# Patient Record
Sex: Female | Born: 1940 | Race: White | Hispanic: No | Smoking: Current every day smoker
Health system: Southern US, Community
[De-identification: ages and names within clinical notes are randomized; demographics above are authoritative.]

## PROBLEM LIST (undated history)

## (undated) DIAGNOSIS — J45909 Unspecified asthma, uncomplicated: Secondary | ICD-10-CM

## (undated) DIAGNOSIS — Z950 Presence of cardiac pacemaker: Secondary | ICD-10-CM

## (undated) DIAGNOSIS — C801 Malignant (primary) neoplasm, unspecified: Secondary | ICD-10-CM

## (undated) DIAGNOSIS — I10 Essential (primary) hypertension: Secondary | ICD-10-CM

## (undated) DIAGNOSIS — I509 Heart failure, unspecified: Secondary | ICD-10-CM

## (undated) DIAGNOSIS — M109 Gout, unspecified: Secondary | ICD-10-CM

## (undated) DIAGNOSIS — I447 Left bundle-branch block, unspecified: Secondary | ICD-10-CM

## (undated) DIAGNOSIS — I219 Acute myocardial infarction, unspecified: Secondary | ICD-10-CM

## (undated) DIAGNOSIS — K219 Gastro-esophageal reflux disease without esophagitis: Secondary | ICD-10-CM

## (undated) DIAGNOSIS — G458 Other transient cerebral ischemic attacks and related syndromes: Secondary | ICD-10-CM

## (undated) DIAGNOSIS — E785 Hyperlipidemia, unspecified: Secondary | ICD-10-CM

## (undated) DIAGNOSIS — Z951 Presence of aortocoronary bypass graft: Secondary | ICD-10-CM

## (undated) DIAGNOSIS — N183 Chronic kidney disease, stage 3 unspecified: Secondary | ICD-10-CM

## (undated) DIAGNOSIS — F419 Anxiety disorder, unspecified: Secondary | ICD-10-CM

## (undated) DIAGNOSIS — I2510 Atherosclerotic heart disease of native coronary artery without angina pectoris: Secondary | ICD-10-CM

## (undated) DIAGNOSIS — Z7901 Long term (current) use of anticoagulants: Secondary | ICD-10-CM

## (undated) DIAGNOSIS — N200 Calculus of kidney: Secondary | ICD-10-CM

## (undated) DIAGNOSIS — J449 Chronic obstructive pulmonary disease, unspecified: Secondary | ICD-10-CM

## (undated) DIAGNOSIS — I70219 Atherosclerosis of native arteries of extremities with intermittent claudication, unspecified extremity: Secondary | ICD-10-CM

## (undated) DIAGNOSIS — E119 Type 2 diabetes mellitus without complications: Secondary | ICD-10-CM

## (undated) DIAGNOSIS — Z95810 Presence of automatic (implantable) cardiac defibrillator: Secondary | ICD-10-CM

## (undated) DIAGNOSIS — F32A Depression, unspecified: Secondary | ICD-10-CM

## (undated) DIAGNOSIS — E039 Hypothyroidism, unspecified: Secondary | ICD-10-CM

## (undated) DIAGNOSIS — I6529 Occlusion and stenosis of unspecified carotid artery: Secondary | ICD-10-CM

## (undated) HISTORY — PX: CHOLECYSTECTOMY: SHX55

## (undated) HISTORY — PX: HIATAL HERNIA REPAIR: SHX195

## (undated) HISTORY — PX: CORONARY ANGIOPLASTY: SHX604

## (undated) HISTORY — DX: Essential (primary) hypertension: I10

## (undated) HISTORY — DX: Acute myocardial infarction, unspecified: I21.9

## (undated) HISTORY — PX: ABDOMINAL HYSTERECTOMY: SHX81

## (undated) HISTORY — PX: THYROID SURGERY: SHX805

## (undated) HISTORY — DX: Occlusion and stenosis of unspecified carotid artery: I65.29

## (undated) HISTORY — PX: CORONARY ARTERY BYPASS GRAFT: SHX141

---

## 1997-10-12 ENCOUNTER — Ambulatory Visit (HOSPITAL_COMMUNITY): Admission: RE | Admit: 1997-10-12 | Discharge: 1997-10-12 | Payer: Self-pay | Admitting: Family Medicine

## 1997-10-20 ENCOUNTER — Ambulatory Visit (HOSPITAL_COMMUNITY): Admission: RE | Admit: 1997-10-20 | Discharge: 1997-10-20 | Payer: Self-pay | Admitting: Family Medicine

## 1998-07-26 ENCOUNTER — Other Ambulatory Visit: Admission: RE | Admit: 1998-07-26 | Discharge: 1998-07-26 | Payer: Self-pay | Admitting: Family Medicine

## 2004-05-14 ENCOUNTER — Ambulatory Visit: Payer: Self-pay | Admitting: Family Medicine

## 2004-08-27 ENCOUNTER — Ambulatory Visit: Payer: Self-pay | Admitting: Family Medicine

## 2005-04-22 ENCOUNTER — Ambulatory Visit: Payer: Self-pay | Admitting: Neurology

## 2005-07-01 ENCOUNTER — Ambulatory Visit: Payer: Self-pay | Admitting: Family Medicine

## 2005-07-04 ENCOUNTER — Ambulatory Visit: Payer: Self-pay | Admitting: Family Medicine

## 2006-03-20 ENCOUNTER — Other Ambulatory Visit: Payer: Self-pay

## 2006-03-20 ENCOUNTER — Emergency Department: Payer: Self-pay | Admitting: General Practice

## 2006-08-04 ENCOUNTER — Ambulatory Visit: Payer: Self-pay | Admitting: Family Medicine

## 2007-08-27 ENCOUNTER — Inpatient Hospital Stay: Payer: Self-pay | Admitting: Internal Medicine

## 2007-08-27 ENCOUNTER — Other Ambulatory Visit: Payer: Self-pay

## 2007-09-08 ENCOUNTER — Ambulatory Visit: Payer: Self-pay | Admitting: Family Medicine

## 2008-09-26 ENCOUNTER — Ambulatory Visit: Payer: Self-pay | Admitting: Family Medicine

## 2009-08-19 HISTORY — PX: EYE SURGERY: SHX253

## 2009-09-27 ENCOUNTER — Ambulatory Visit: Payer: Self-pay | Admitting: Family Medicine

## 2009-12-18 ENCOUNTER — Ambulatory Visit: Payer: Self-pay | Admitting: Family

## 2009-12-26 ENCOUNTER — Ambulatory Visit: Payer: Self-pay | Admitting: Gastroenterology

## 2010-01-23 ENCOUNTER — Emergency Department: Payer: Self-pay | Admitting: Internal Medicine

## 2010-02-21 ENCOUNTER — Ambulatory Visit: Payer: Self-pay | Admitting: Podiatry

## 2010-10-10 ENCOUNTER — Ambulatory Visit: Payer: Self-pay | Admitting: Family Medicine

## 2010-11-18 ENCOUNTER — Emergency Department: Payer: Self-pay | Admitting: *Deleted

## 2011-03-18 DIAGNOSIS — E785 Hyperlipidemia, unspecified: Secondary | ICD-10-CM | POA: Insufficient documentation

## 2011-04-29 ENCOUNTER — Emergency Department: Payer: Self-pay | Admitting: Emergency Medicine

## 2011-04-29 LAB — CBC
HCT: 42.1 % (ref 35.0–47.0)
HGB: 14.3 g/dL (ref 12.0–16.0)
MCV: 91 fL (ref 80–100)
Platelet: 184 10*3/uL (ref 150–440)
RBC: 4.61 10*6/uL (ref 3.80–5.20)
RDW: 13 % (ref 11.5–14.5)
WBC: 6.8 10*3/uL (ref 3.6–11.0)

## 2011-04-29 LAB — TROPONIN I: Troponin-I: <0.02 ng/mL

## 2011-04-29 LAB — COMPREHENSIVE METABOLIC PANEL
Albumin: 3.9 g/dL (ref 3.4–5.0)
Alkaline Phosphatase: 87 U/L (ref 50–136)
Anion Gap: 9 (ref 7–16)
BUN: 16 mg/dL (ref 7–18)
Bilirubin,Total: 0.3 mg/dL (ref 0.2–1.0)
Calcium, Total: 9.6 mg/dL (ref 8.5–10.1)
Chloride: 103 mmol/L (ref 98–107)
Co2: 27 mmol/L (ref 21–32)
Creatinine: 1 mg/dL (ref 0.60–1.30)
EGFR (African American): >60
EGFR (Non-African Amer.): 58 — ABNORMAL LOW
Glucose: 176 mg/dL — ABNORMAL HIGH (ref 65–99)
Osmolality: 283 (ref 275–301)
Potassium: 3.8 mmol/L (ref 3.5–5.1)
SGOT(AST): 30 U/L (ref 15–37)
SGPT (ALT): 30 U/L
Sodium: 139 mmol/L (ref 136–145)
Total Protein: 7.6 g/dL (ref 6.4–8.2)

## 2011-04-29 LAB — CK TOTAL AND CKMB (NOT AT ARMC)
CK, Total: 56 U/L (ref 21–215)
CK-MB: 0.6 ng/mL (ref 0.5–3.6)

## 2011-07-03 ENCOUNTER — Emergency Department: Payer: Self-pay | Admitting: *Deleted

## 2011-07-03 LAB — CBC
HCT: 41.2 % (ref 35.0–47.0)
HGB: 13.9 g/dL (ref 12.0–16.0)
MCH: 31 pg (ref 26.0–34.0)
MCHC: 33.8 g/dL (ref 32.0–36.0)
RDW: 13.2 % (ref 11.5–14.5)

## 2011-07-04 ENCOUNTER — Emergency Department: Payer: Self-pay | Admitting: Emergency Medicine

## 2011-07-04 LAB — COMPREHENSIVE METABOLIC PANEL
Anion Gap: 10 (ref 7–16)
Bilirubin,Total: 0.2 mg/dL (ref 0.2–1.0)
Calcium, Total: 9.6 mg/dL (ref 8.5–10.1)
Co2: 27 mmol/L (ref 21–32)
EGFR (African American): >60
Glucose: 136 mg/dL — ABNORMAL HIGH (ref 65–99)
Osmolality: 289 (ref 275–301)
Potassium: 4.4 mmol/L (ref 3.5–5.1)
SGOT(AST): 24 U/L (ref 15–37)
SGPT (ALT): 31 U/L
Sodium: 144 mmol/L (ref 136–145)
Total Protein: 7.7 g/dL (ref 6.4–8.2)

## 2011-07-04 LAB — URINALYSIS, COMPLETE
Bacteria: NONE SEEN
Bilirubin,UR: NEGATIVE
Glucose,UR: NEGATIVE mg/dL (ref 0–75)
Nitrite: NEGATIVE
RBC,UR: 2 /HPF (ref 0–5)
Specific Gravity: 1.012 (ref 1.003–1.030)
Squamous Epithelial: 2
WBC UR: <1 /HPF (ref 0–5)

## 2011-07-04 LAB — CK TOTAL AND CKMB (NOT AT ARMC): CK-MB: 0.9 ng/mL (ref 0.5–3.6)

## 2011-07-04 LAB — TSH: Thyroid Stimulating Horm: 2.44 u[IU]/mL

## 2011-09-11 DIAGNOSIS — I10 Essential (primary) hypertension: Secondary | ICD-10-CM | POA: Insufficient documentation

## 2011-09-11 DIAGNOSIS — F341 Dysthymic disorder: Secondary | ICD-10-CM | POA: Insufficient documentation

## 2011-09-15 DIAGNOSIS — R0989 Other specified symptoms and signs involving the circulatory and respiratory systems: Secondary | ICD-10-CM | POA: Insufficient documentation

## 2011-10-14 ENCOUNTER — Ambulatory Visit: Payer: Self-pay | Admitting: Family Medicine

## 2012-01-02 LAB — COMPREHENSIVE METABOLIC PANEL
Albumin: 3.8 g/dL (ref 3.4–5.0)
Anion Gap: 10 (ref 7–16)
BUN: 11 mg/dL (ref 7–18)
Calcium, Total: 9.7 mg/dL (ref 8.5–10.1)
Co2: 28 mmol/L (ref 21–32)
EGFR (African American): >60
Glucose: 127 mg/dL — ABNORMAL HIGH (ref 65–99)
Osmolality: 288 (ref 275–301)
Potassium: 3.6 mmol/L (ref 3.5–5.1)
SGOT(AST): 31 U/L (ref 15–37)
SGPT (ALT): 37 U/L (ref 12–78)
Total Protein: 7.8 g/dL (ref 6.4–8.2)

## 2012-01-02 LAB — CBC
HGB: 15 g/dL (ref 12.0–16.0)
MCH: 31.1 pg (ref 26.0–34.0)
Platelet: 215 10*3/uL (ref 150–440)
RBC: 4.83 10*6/uL (ref 3.80–5.20)

## 2012-01-02 LAB — TROPONIN I: Troponin-I: <0.02 ng/mL

## 2012-01-03 LAB — CBC WITH DIFFERENTIAL/PLATELET
Basophil #: 0 10*3/uL (ref 0.0–0.1)
Basophil %: 0.3 %
Eosinophil #: 0.2 10*3/uL (ref 0.0–0.7)
Eosinophil %: 2.4 %
HGB: 12.5 g/dL (ref 12.0–16.0)
Lymphocyte %: 40.5 %
MCH: 30.1 pg (ref 26.0–34.0)
MCV: 91 fL (ref 80–100)
Neutrophil #: 3.8 10*3/uL (ref 1.4–6.5)
Neutrophil %: 51.8 %
RBC: 4.15 10*6/uL (ref 3.80–5.20)
RDW: 13.3 % (ref 11.5–14.5)

## 2012-01-03 LAB — BASIC METABOLIC PANEL
Anion Gap: 8 (ref 7–16)
BUN: 12 mg/dL (ref 7–18)
Chloride: 109 mmol/L — ABNORMAL HIGH (ref 98–107)
Co2: 28 mmol/L (ref 21–32)
Creatinine: 1.03 mg/dL (ref 0.60–1.30)
EGFR (African American): >60
Potassium: 3.7 mmol/L (ref 3.5–5.1)
Sodium: 145 mmol/L (ref 136–145)

## 2012-01-03 LAB — CK-MB
CK-MB: 0.9 ng/mL (ref 0.5–3.6)
CK-MB: 0.9 ng/mL (ref 0.5–3.6)

## 2012-01-04 ENCOUNTER — Inpatient Hospital Stay: Payer: Self-pay | Admitting: Student

## 2012-01-05 LAB — BASIC METABOLIC PANEL
Anion Gap: 8 (ref 7–16)
BUN: 15 mg/dL (ref 7–18)
Calcium, Total: 9 mg/dL (ref 8.5–10.1)
Chloride: 105 mmol/L (ref 98–107)
Co2: 27 mmol/L (ref 21–32)
Creatinine: 1.08 mg/dL (ref 0.60–1.30)
EGFR (Non-African Amer.): 52 — ABNORMAL LOW
Potassium: 3.7 mmol/L (ref 3.5–5.1)
Sodium: 140 mmol/L (ref 136–145)

## 2012-01-05 LAB — CBC
MCH: 31 pg (ref 26.0–34.0)
MCHC: 34.5 g/dL (ref 32.0–36.0)
Platelet: 156 10*3/uL (ref 150–440)
RBC: 4.32 10*6/uL (ref 3.80–5.20)
RDW: 12.8 % (ref 11.5–14.5)

## 2012-02-12 ENCOUNTER — Emergency Department: Payer: Self-pay | Admitting: *Deleted

## 2012-02-12 LAB — CBC
HGB: 15 g/dL (ref 12.0–16.0)
MCH: 31.2 pg (ref 26.0–34.0)
MCV: 90 fL (ref 80–100)
Platelet: 213 10*3/uL (ref 150–440)
RBC: 4.8 10*6/uL (ref 3.80–5.20)
RDW: 13.2 % (ref 11.5–14.5)
WBC: 8.2 10*3/uL (ref 3.6–11.0)

## 2012-02-12 LAB — COMPREHENSIVE METABOLIC PANEL
Alkaline Phosphatase: 101 U/L (ref 50–136)
BUN: 8 mg/dL (ref 7–18)
Bilirubin,Total: 0.3 mg/dL (ref 0.2–1.0)
Chloride: 109 mmol/L — ABNORMAL HIGH (ref 98–107)
Creatinine: 1 mg/dL (ref 0.60–1.30)
EGFR (African American): >60
EGFR (Non-African Amer.): 57 — ABNORMAL LOW
Glucose: 171 mg/dL — ABNORMAL HIGH (ref 65–99)
SGOT(AST): 32 U/L (ref 15–37)
SGPT (ALT): 32 U/L (ref 12–78)
Total Protein: 8 g/dL (ref 6.4–8.2)

## 2012-02-12 LAB — LIPASE, BLOOD: Lipase: 117 U/L (ref 73–393)

## 2012-02-12 LAB — URINALYSIS, COMPLETE
Bacteria: NONE SEEN
Bilirubin,UR: NEGATIVE
Glucose,UR: NEGATIVE mg/dL (ref 0–75)
RBC,UR: 4 /HPF (ref 0–5)
Squamous Epithelial: 1

## 2012-03-01 ENCOUNTER — Ambulatory Visit: Payer: Self-pay | Admitting: Gastroenterology

## 2012-03-05 ENCOUNTER — Ambulatory Visit: Payer: Self-pay | Admitting: Gastroenterology

## 2012-03-08 LAB — PATHOLOGY REPORT

## 2012-03-10 ENCOUNTER — Ambulatory Visit: Payer: Self-pay | Admitting: Surgery

## 2012-03-15 ENCOUNTER — Ambulatory Visit: Payer: Self-pay | Admitting: Surgery

## 2012-03-15 LAB — CK TOTAL AND CKMB (NOT AT ARMC)
CK, Total: 32 U/L (ref 21–215)
CK-MB: <0.5 ng/mL — ABNORMAL LOW (ref 0.5–3.6)

## 2012-03-15 LAB — TROPONIN I: Troponin-I: <0.02 ng/mL

## 2012-03-16 ENCOUNTER — Emergency Department: Payer: Self-pay | Admitting: Emergency Medicine

## 2012-03-16 LAB — COMPREHENSIVE METABOLIC PANEL
Alkaline Phosphatase: 97 U/L (ref 50–136)
Anion Gap: 7 (ref 7–16)
Bilirubin,Total: 0.4 mg/dL (ref 0.2–1.0)
Calcium, Total: 9.5 mg/dL (ref 8.5–10.1)
Chloride: 105 mmol/L (ref 98–107)
Co2: 27 mmol/L (ref 21–32)
EGFR (Non-African Amer.): 58 — ABNORMAL LOW
Glucose: 119 mg/dL — ABNORMAL HIGH (ref 65–99)
Osmolality: 279 (ref 275–301)
Potassium: 3.5 mmol/L (ref 3.5–5.1)
SGOT(AST): 27 U/L (ref 15–37)
SGPT (ALT): 35 U/L (ref 12–78)
Total Protein: 7.2 g/dL (ref 6.4–8.2)

## 2012-03-16 LAB — URINALYSIS, COMPLETE
Bacteria: NONE SEEN
Glucose,UR: NEGATIVE mg/dL (ref 0–75)
Leukocyte Esterase: NEGATIVE
Nitrite: NEGATIVE
Protein: NEGATIVE
Squamous Epithelial: 1
WBC UR: <1 /HPF (ref 0–5)

## 2012-03-16 LAB — CBC
Platelet: 168 10*3/uL (ref 150–440)
RBC: 4.26 10*6/uL (ref 3.80–5.20)
RDW: 12.9 % (ref 11.5–14.5)
WBC: 10.2 10*3/uL (ref 3.6–11.0)

## 2012-03-16 LAB — PATHOLOGY REPORT

## 2012-03-16 LAB — TROPONIN I: Troponin-I: <0.02 ng/mL

## 2012-06-12 ENCOUNTER — Emergency Department: Payer: Self-pay | Admitting: Emergency Medicine

## 2012-06-12 LAB — CBC
HGB: 15.3 g/dL (ref 12.0–16.0)
MCH: 30.8 pg (ref 26.0–34.0)
MCHC: 34.8 g/dL (ref 32.0–36.0)
MCV: 89 fL (ref 80–100)
RBC: 4.97 10*6/uL (ref 3.80–5.20)

## 2012-06-12 LAB — URINALYSIS, COMPLETE
Bacteria: NONE SEEN
Bilirubin,UR: NEGATIVE
Glucose,UR: NEGATIVE mg/dL (ref 0–75)
Ketone: NEGATIVE
Nitrite: NEGATIVE
Ph: 8 (ref 4.5–8.0)
Specific Gravity: 1.004 (ref 1.003–1.030)
Squamous Epithelial: <1

## 2012-06-12 LAB — COMPREHENSIVE METABOLIC PANEL
Albumin: 3.8 g/dL (ref 3.4–5.0)
Alkaline Phosphatase: 104 U/L (ref 50–136)
BUN: 11 mg/dL (ref 7–18)
Bilirubin,Total: 0.4 mg/dL (ref 0.2–1.0)
Calcium, Total: 9.3 mg/dL (ref 8.5–10.1)
Co2: 25 mmol/L (ref 21–32)
Creatinine: 0.94 mg/dL (ref 0.60–1.30)
EGFR (African American): >60
EGFR (Non-African Amer.): >60
Glucose: 105 mg/dL — ABNORMAL HIGH (ref 65–99)
Osmolality: 279 (ref 275–301)
Potassium: 4 mmol/L (ref 3.5–5.1)
SGPT (ALT): 25 U/L (ref 12–78)

## 2012-06-12 LAB — TROPONIN I: Troponin-I: <0.02 ng/mL

## 2012-06-12 LAB — CK TOTAL AND CKMB (NOT AT ARMC): CK-MB: <0.5 ng/mL — ABNORMAL LOW (ref 0.5–3.6)

## 2012-07-14 ENCOUNTER — Ambulatory Visit: Payer: Self-pay | Admitting: Gastroenterology

## 2012-07-16 LAB — PATHOLOGY REPORT

## 2012-10-14 ENCOUNTER — Ambulatory Visit: Payer: Self-pay | Admitting: Family Medicine

## 2012-10-23 DIAGNOSIS — E119 Type 2 diabetes mellitus without complications: Secondary | ICD-10-CM | POA: Insufficient documentation

## 2013-02-16 ENCOUNTER — Emergency Department: Payer: Self-pay | Admitting: Emergency Medicine

## 2013-03-30 ENCOUNTER — Inpatient Hospital Stay: Payer: Self-pay | Admitting: Student

## 2013-03-30 LAB — HEMOGLOBIN A1C: Hemoglobin A1C: 6 % (ref 4.2–6.3)

## 2013-03-30 LAB — LIPID PANEL
Cholesterol: 175 mg/dL (ref 0–200)
Ldl Cholesterol, Calc: 102 mg/dL — ABNORMAL HIGH (ref 0–100)
VLDL Cholesterol, Calc: 32 mg/dL (ref 5–40)

## 2013-03-30 LAB — CBC
HGB: 12.9 g/dL (ref 12.0–16.0)
MCH: 30.3 pg (ref 26.0–34.0)
MCV: 87 fL (ref 80–100)
Platelet: 187 10*3/uL (ref 150–440)
RDW: 13.7 % (ref 11.5–14.5)

## 2013-03-30 LAB — COMPREHENSIVE METABOLIC PANEL
Anion Gap: 6 — ABNORMAL LOW (ref 7–16)
BUN: 13 mg/dL (ref 7–18)
Calcium, Total: 9.3 mg/dL (ref 8.5–10.1)
Co2: 26 mmol/L (ref 21–32)
Creatinine: 0.92 mg/dL (ref 0.60–1.30)
EGFR (African American): >60
Osmolality: 278 (ref 275–301)
SGOT(AST): 13 U/L — ABNORMAL LOW (ref 15–37)

## 2013-03-30 LAB — TROPONIN I: Troponin-I: <0.02 ng/mL

## 2013-03-30 LAB — CK-MB: CK-MB: 0.9 ng/mL (ref 0.5–3.6)

## 2013-03-30 LAB — APTT: Activated PTT: 27.7 secs (ref 23.6–35.9)

## 2013-03-31 LAB — CK TOTAL AND CKMB (NOT AT ARMC): CK, Total: 149 U/L (ref 21–215)

## 2013-03-31 LAB — TSH: Thyroid Stimulating Horm: 0.266 u[IU]/mL — ABNORMAL LOW

## 2013-03-31 LAB — CBC WITH DIFFERENTIAL/PLATELET
Basophil #: 0 10*3/uL (ref 0.0–0.1)
Basophil %: 0.5 %
Eosinophil #: 0.1 10*3/uL (ref 0.0–0.7)
HCT: 35.9 % (ref 35.0–47.0)
Lymphocyte #: 2.1 10*3/uL (ref 1.0–3.6)
MCHC: 32.9 g/dL (ref 32.0–36.0)
MCV: 88 fL (ref 80–100)
Monocyte #: 0.5 x10 3/mm (ref 0.2–0.9)
Monocyte %: 6.5 %
Neutrophil %: 61.4 %
RBC: 4.09 10*6/uL (ref 3.80–5.20)
RDW: 13.6 % (ref 11.5–14.5)

## 2013-03-31 LAB — URINALYSIS, COMPLETE
Bacteria: NONE SEEN
Bilirubin,UR: NEGATIVE
Blood: NEGATIVE
Glucose,UR: NEGATIVE mg/dL (ref 0–75)
Ketone: NEGATIVE
Leukocyte Esterase: NEGATIVE
Nitrite: NEGATIVE
Protein: NEGATIVE
Squamous Epithelial: <1
WBC UR: NONE SEEN /HPF (ref 0–5)

## 2013-03-31 LAB — CK-MB: CK-MB: 35.7 ng/mL — ABNORMAL HIGH (ref 0.5–3.6)

## 2013-03-31 LAB — BASIC METABOLIC PANEL
Anion Gap: 6 — ABNORMAL LOW (ref 7–16)
BUN: 12 mg/dL (ref 7–18)
Calcium, Total: 9 mg/dL (ref 8.5–10.1)
Chloride: 108 mmol/L — ABNORMAL HIGH (ref 98–107)
Co2: 27 mmol/L (ref 21–32)
Creatinine: 0.86 mg/dL (ref 0.60–1.30)
Osmolality: 282 (ref 275–301)
Potassium: 3.8 mmol/L (ref 3.5–5.1)
Sodium: 141 mmol/L (ref 136–145)

## 2013-03-31 LAB — APTT: Activated PTT: 105.1 secs — ABNORMAL HIGH (ref 23.6–35.9)

## 2013-03-31 LAB — TROPONIN I: Troponin-I: 7.8 ng/mL — ABNORMAL HIGH

## 2013-04-01 LAB — BASIC METABOLIC PANEL
Calcium, Total: 9 mg/dL (ref 8.5–10.1)
Creatinine: 0.73 mg/dL (ref 0.60–1.30)
EGFR (African American): >60
Glucose: 93 mg/dL (ref 65–99)
Osmolality: 281 (ref 275–301)
Potassium: 3.6 mmol/L (ref 3.5–5.1)
Sodium: 142 mmol/L (ref 136–145)

## 2013-11-29 ENCOUNTER — Ambulatory Visit: Payer: Self-pay | Admitting: Family Medicine

## 2014-08-08 NOTE — Consult Note (Signed)
General Aspect 74 year old female with history of coronary artery disease followed by Dr. Clayborn Bigness as well as [facility] who was admitted with chest pain in the left bundle branch block.  Her left bundle branch block does not appear to be acute.  She is currently hemodynamically stable.  She describes a chest tightness and heaviness with associated mild nausea.  This has features similar to her angina.  She is ruled out for myocardial infarction thus far. she is hemodynamically stable and states she is compliant with her medications as an outpatient.   Physical Exam:   GEN well developed, well nourished, no acute distress    HEENT PERRL, hearing intact to voice    NECK supple    RESP normal resp effort  clear BS    CARD Regular rate and rhythm  No murmur    ABD denies tenderness  normal BS    LYMPH negative neck    EXTR negative cyanosis/clubbing, negative edema    SKIN normal to palpation    NEURO cranial nerves intact, motor/sensory function intact    PSYCH A+O to time, place, person   Review of Systems:   Subjective/Chief Complaint chest discomfort    General: No Complaints    Skin: No Complaints    ENT: No Complaints    Eyes: No Complaints    Neck: No Complaints    Respiratory: No Complaints    Cardiovascular: Chest pain or discomfort  Tightness    Gastrointestinal: No Complaints    Genitourinary: No Complaints    Vascular: No Complaints    Musculoskeletal: No Complaints    Neurologic: No Complaints    Hematologic: No Complaints    Endocrine: No Complaints    Psychiatric: No Complaints    Review of Systems: All other systems were reviewed and found to be negative    Medications/Allergies Reviewed Medications/Allergies reviewed     Hypothyroidism:    MI - Myocardial Infarct: Nov 2007   Hiatal Hernia:    Cataract Extraction: left and right   cad:    Hysterectomy - Partial:    Stent - Cardiac:    Cystectomy: cyst removed on left  side of neck   Hysterectomy - Partial:        Admit Diagnosis:   UNSTABLE ANGINA: 03-Jan-2012, Active, UNSTABLE ANGINA  Home Medications: Medication Instructions Status  levothyroxine 100 mcg (0.1 mg) oral tablet 1 tab(s) orally once a day Active  losartan 50 mg oral tablet 1 tab(s) orally once a day Active  omeprazole 20 mg oral delayed release capsule 1 cap(s) orally 2 times a day Active  alprazolam 0.25 mg oral tablet 1 tab(s) orally once a day as needed for anxiety. Active  Aspirin Enteric Coated 81 mg oral delayed release tablet 1 tab(s) orally once a day (at bedtime) Active  atenolol 25 mg oral tablet 0.5 tab(s) orally once a day (at bedtime) Active  Nitroquick 1 tab (0.4 milligrams) sublingual every 5 minutes up to 3 doses as needed for chest pain. *if no relief call md or go to emergency room* Active  Kombiglyze XR 5 mg-500 mg oral tablet, extended release 1 tab(s) orally once a day (at bedtime) Active  sucralfate 1 g oral tablet 1 tab(s) orally 4 times a day (before meals and at bedtime) as needed to coat stomach. Active  fluticasone 50 mcg/inh nasal spray 2 spray(s) into each nostril once a day (in the morning). Active  Fish Oil 1000 mg oral capsule 1 cap(s) orally 2  times a day Active  cinnamon-plus chromium 2000mg  tablet 1 tab(s) orally once a day (in the morning) Active   EKG:   EKG NSR    Abnormal LBBB    Sulfa drugs: Unknown    Impression 74 year old female with history of coronary artery disease status post PCI with history of recent chest pain prompting admission.  She described discomfort similar to her angina.  Her electrocardiogram reveals upper branch block.  She is ruled out for myocardial infarction.  Etiology of symptoms is unclear.  Progression of her coronary disease versus noncardiac etiology is likely however given her left bundle and chest discomfort similar to angina, we'll need to rule out progression of her coronary artery disease.  Given her left  bundle and rest symptoms, would recommend proceeding a left cardiac catheterization evaluate coronary anatomy.  Will discuss this with Dr. Clayborn Bigness and plan to proceed with this on Monday.    Plan 1.  Continue with current medications 2.  Follow on telemetry 3.  Consideration for left cardiac catheterization on Monday to evaluate coronary anatomy.   Electronic Signatures: Teodoro Spray (MD)  (Signed 14-Sep-13 09:53)  Authored: General Aspect/Present Illness, History and Physical Exam, Review of System, Past Medical History, Health Issues, Home Medications, EKG , Allergies, Impression/Plan   Last Updated: 14-Sep-13 09:53 by Teodoro Spray (MD)

## 2014-08-08 NOTE — Op Note (Signed)
PATIENT NAME:  Christine Obrien, Christine Obrien MR#:  [id number] DATE OF BIRTH:  Jul 27, 1940  DATE OF PROCEDURE:  03/15/2012  PREOPERATIVE DIAGNOSIS: Chronic cholecystitis.   POSTOPERATIVE DIAGNOSIS: Chronic cholecystitis.   PROCEDURE: Laparoscopic cholecystectomy, cholangiogram.   SURGEON: Rochel Brome, MD   ANESTHESIA: General.   INDICATIONS: This 74 year old has had recent frequent episodes of nausea, also right upper quadrant pain, recent ultrasound findings of evidence of a polyp and some sludge within the gallbladder, some slight thickening of gallbladder wall, and right upper quadrant tenderness. She also had Gastroenterology consultation and endoscopy. There was a rare Helicobacter. There was no ulcer found, and cholecystitis appeared to be a cause of her nausea and pain, and surgery was recommended for definitive treatment.   DESCRIPTION OF PROCEDURE: The patient was placed on the operating table in the supine position under general endotracheal anesthesia. The abdomen was prepared with ChloraPrep and draped in a sterile manner. A short incision was made in the inferior aspect of the umbilicus and carried down to the deep fascia which was grasped with a laryngeal hook and elevated. A Veress needle was inserted, aspirated, and irrigated with a saline solution. Next, the peritoneal cavity was inflated with carbon dioxide. The Veress needle was removed. The 10 mm cannula was inserted. The 10 mm, 0-degree laparoscope was inserted to view the peritoneal cavity. There was some mild gaseous distention of the colon. The liver appeared normal. Another incision was made in the epigastrium slightly to the right of the midline to introduce an 11 mm cannula. Another incision was made in the right upper quadrant to insert two 5-mm cannulas. The patient was placed in the reverse Trendelenburg position and turned several degrees to the left. The gallbladder was retracted towards the right shoulder, appeared to have some  thickening of the gallbladder wall. Multiple adhesions were taken down with blunt and sharp dissection. The neck of the gallbladder was retracted inferiorly and laterally. The porta hepatis was demonstrated. The cystic duct was dissected free from surrounding structures. The cystic artery was dissected free from surrounding structures. The neck of the gallbladder was mobilized with incision of the visceral peritoneum. A critical view of safety was demonstrated. An Endo Clip was placed across the cystic duct adjacent to the neck of the gallbladder. An incision was made in the cystic duct to introduce a Reddick catheter. Half-strength Conray-60 dye was injected as the cholangiogram was done with fluoroscopy, viewing the biliary tree and flow of dye into the duodenum. No retained stones were seen. The Reddick catheter was removed. The cystic duct was doubly ligated with Endo Clips and divided. The cystic artery was controlled with double Endo Clips and divided. The gallbladder was dissected free from the liver with hook and cautery. There was very minimal degree of bleeding. Hemostasis was subsequently intact. The gallbladder was delivered up through the infraumbilical incision, opened, suctioned, and removed and submitted for routine pathology. The right upper quadrant was further inspected. Hemostasis was intact. The cannulas were removed. Carbon dioxide was allowed to escape from the peritoneal cavity. The fascial defect at the umbilicus was closed with a 0 Vicryl figure-of-eight suture. The four incisions were closed with interrupted 5-0 chromic subcuticular sutures, benzoin, and Steri-Strips. Dressings were applied with paper tape. The patient tolerated surgery satisfactorily and was then prepared for transfer to the recovery room.   ____________________________ Lenna Sciara. Rochel Brome, MD jws:cbb D: 03/15/2012 12:18:53 ET T: 03/15/2012 12:49:39 ET JOB#: [id number]  cc: Loreli Dollar, MD, <Dictator> Loreli Dollar  MD ELECTRONICALLY SIGNED 03/17/2012 9:20

## 2014-08-08 NOTE — H&P (Signed)
PATIENT NAME:  Christine Obrien, AVALLONE MR#:  [id number] DATE OF BIRTH:  07-06-1940  DATE OF ADMISSION:  01/02/2012  PRIMARY CARE PHYSICIAN: [name], MD  History obtained from patient, her husband at bedside. Old records have been reviewed. Case discussed with Dr. Thomasene Lot of ER. Chest x-ray and EKG personally reviewed.   CHIEF COMPLAINT: Chest pressure.   HISTORY OF PRESENT ILLNESS: A 74 year old Caucasian female patient with history of hypertension, diabetes mellitus, coronary artery disease status post stent in 2007 presents to the emergency room complaining of worsening chest pressure radiating to the neck along with nausea and shortness of breath. Patient mentions that in 2007 when she had the cardiac catheterization and stent, her pain was mainly in the neck radiating to the chest with nausea which was similar. This chest pressure is slowly worsening and the last time it lasted 1 hour before it resolved with nitroglycerin. This is not exertional with no aggravating or relieving factors. Associated with nausea and shortness of breath. The patient was previously admitted to [facility] in 2010 when she had a cardiac catheterization which showed patent stens and was discharged home.   PAST MEDICAL HISTORY: Coronary artery disease status post stent in 2007, status post catheterization with no intervention in 2010, depression, hypertension, hypothyroidism, hyperlipidemia, non-insulin-dependent diabetes mellitus, osteoporosis, carotid stenosis.   PAST SURGICAL HISTORY: Hysterectomy.   ALLERGIES: Sulfa.   HOME MEDICATIONS:  1. Xanax 0.25 mg once a day as needed.  2. Aspirin 81 mg oral daily.  3. Levothyroxine 100 mcg daily.  4. Lipitor 20 mg oral daily.  5. Metoprolol extended-release 20 mg daily.  6. Plavix 75 mg daily.   SOCIAL HISTORY: The patient smokes 1/2 pack of cigarettes a day. No alcohol. No illicit drugs. Lives at home with her husband.   FAMILY HISTORY: Consistent with  coronary artery disease.   REVIEW OF SYSTEMS: CONSTITUTIONAL: No fever, fatigue, weakness. EYES: No blurred vision, pain, redness. ENT: No tinnitus, ear pain, hearing loss. RESPIRATORY: No cough, wheeze, hemoptysis. Does complain of shortness of breath with the chest pain. CARDIOVASCULAR: Has chest pain. No orthopnea, edema. GASTROINTESTINAL: No nausea, vomiting, diarrhea, abdominal pain. GENITOURINARY: No dysuria, hematuria, frequency. ENDOCRINE: No polyuria, nocturia. Does have hypothyroidism. HEMATOLOGIC/LYMPHATIC: No anemia, easy bruising, or bleeding. INTEGUMENTARY: No acne, rash, lesions. MUSCULOSKELETAL: No back pain. No arthritis. NEUROLOGIC: No numbness, weakness, dysarthria. PSYCHIATRIC: No anxiety, depression.   PHYSICAL EXAMINATION: Temperature of 98.7, pulse is 71, blood pressure 134/79, respirations 22, saturating 98% on room air.   GENERAL: Elderly Caucasian female patient sitting up in bed, comfortable, conversational, cooperative with exam.  PSYCHIATRIC: Alert and oriented x3.  Mood and affect appropriate. Judgment intact.   HEENT: Atraumatic, normocephalic. Oral mucosa moist and pink. External ears and nose normal. No pallor. No icterus. Pupils bilaterally equal and reactive to light.   NECK: Supple. No thyromegaly. No palpable lymph nodes. Trachea midline. No carotid bruit, JVD.  CARDIOVASCULAR: S1, S2, regular rate and rhythm without any murmurs. Peripheral pulses 2+.   RESPIRATORY: Normal work of breathing. Clear to auscultation on both sides. Normal to percussion.     GI: Soft abdomen, nontender. Bowel sounds present. No hepatosplenomegaly palpable.   SKIN: Warm and dry. No petechiae, rash, ulcers.   MUSCULOSKELETAL: No joint swelling, redness, effusion of the large joints. Normal muscle tone.   NEUROLOGIC: Motor strength 5/5 in upper and lower extremities. Sensation to fine touch intact all over.   LABORATORY STUDIES: Show glucose 127, BUN 11, creatinine 1.07, GFR  greater than 60.  AST, ALT, alkaline phosphatase, bilirubin normal. Troponin less than 0.02. WBC 7.9, hemoglobin 15, platelets of 215,000. INR 0.9.   EKG shows left bundle branch block which is unchanged from prior EKG.   Chest x-ray shows no acute abnormalities.   ASSESSMENT AND PLAN:  1. Unstable angina in a patient with hypertension, diabetes, prior coronary artery disease status post stent, worsening chest pressure radiating to the neck with typical symptoms of shortness of breath and nausea. The patient continues to smoke, raising her risk for coronary artery disease. We will admit the patient, continue the beta-blocker, aspirin, Plavix patient is on and the statin.  We will get 2 more sets of cardiac enzymes. Consult Cardiology, Dr. Clayborn Bigness, who has seen the patient in the past. The patient might need a cardiac catheterization or stress test during the hospital stay.  2. Hypertension, well controlled. Continue medication. 3. Tobacco abuse. Patient has been counseled for more than 4 minutes to quit smoking. A nicotine patch has been ordered. The patient seems to be determined to quit smoking.  4. Diabetes mellitus. Sliding scale insulin and diabetic diet.  5. Deep venous thrombosis prophylaxis with Lovenox.    CODE STATUS: FULL CODE.   TIME SPENT: Time spent today on this case was 55 minutes with more than 50% time spent in coordination of care. ____________________________ Leia Alf. Taneah Masri, MD srs:vtd D: 01/02/2012 21:21:13 ET T: 01/03/2012 06:48:14 ET  JOB#: [id number] cc: Alveta Heimlich R. Crandall Harvel, MD, <Dictator> Youlanda Roys. Lovie Macadamia, MD Neita Carp MD ELECTRONICALLY SIGNED 01/03/2012 13:43

## 2014-08-08 NOTE — Discharge Summary (Signed)
PATIENT NAME:  Christine Obrien, RAIDER MR#:  [id number] DATE OF BIRTH:  1940-11-30  DATE OF ADMISSION:  01/04/2012 DATE OF DISCHARGE:  01/05/2012  CONSULTANTS:  Dr. Ubaldo Glassing and Dr. Clayborn Bigness, cardiology.   PRIMARY CARE PHYSICIAN: Dr. Lovie Macadamia   CHIEF COMPLAINT: Chest pressure.   DISCHARGE DIAGNOSES:  1. Chest pain, likely not cardiac, status post catheter and improvement with simethicone.  2. Possible bloating.  3. Bradycardia possibly in the setting of beta blocker use.  4. Tobacco abuse.  5. History of coronary artery disease status post stenting in the past.  6. Depression.  7. Hypertension.  8. Hypothyroidism.  9. Hyperlipidemia.  10. Non-insulin-dependent diabetes mellitus.  11. Osteoporosis.  12. Carotid stenosis.   DISCHARGE MEDICATIONS:  1. Levothyroxine 100 mcg daily.  2. Losartan 50 mg daily.  3. Omeprazole 20 mg 2 times Christine day.  4. Alprazolam 0.25 mg, 1 tab once Christine day as needed for anxiety.  5. Aspirin 81 mg daily.  6. NitroQuick 0.4 mg sublingually every five minutes times three as needed for chest pain. 7. Kombiglyze XR 5/500 mg extended-release, 1 tab once Christine day at bedtime.  8. Sucralfate 1 gram oral tablet 4 times Christine day as needed for bloating. 9. Fluticasone 50 mcg, twos sprays in each nostril once Christine day.  10. Fish oil 1000 mg 2 times Christine day. 11. Cinnamon plus acromion 2000 mg, 1 tab once Christine day. 12. Rosuvastatin 5 mg once Christine day.   DIET: Low sodium, low fat, low cholesterol diabetic diet.   ACTIVITY: As tolerated.   FOLLOWUP:  1. Please follow up with Dr. Clayborn Bigness, your cardiologist, in 1 to 2 weeks.  2. Please follow with your primary care physician in 1 to 2 weeks.   DISPOSITION: Home.  HISTORY OF PRESENT ILLNESS:  For full details of history and physical, please see the dictation on 09/13 by [name]. Briefly, this is Christine Obrien with coronary artery disease status post stenting in the past who presented with chest pressure radiating to the neck along with  nausea and shortness of breath. She was admitted to the hospitalist service for further evaluation and management.   SIGNIFICANT LABS AND IMAGING: Troponin is negative times three. CK-MB negative times three. CBC within normal limits on arrival. INR on arrival 0.9. Initial glucose 127, creatinine 1.07, potassium 3.6. LFTs within normal limits. X-ray of the chest, portable, one view: No acute cardiopulmonary disease.   The patient underwent Christine cardiac catheterization on 01/05/2012, the results of which are not up yet, but per Dr. Clayborn Bigness no intervention was needed and medical management was recommended.   HOSPITAL COURSE: The patient was admitted to the hospitalist service for observation and further work-up. Cardiology consult was also placed. Cycled cardiac markers were obtained, which were all negative. While hospitalized the patient also complained of chest pain/pressure,  however, described it as more with eating and some bloating as well and therefore was started on simethicone. She was seen by Dr. Ubaldo Glassing and given her history of coronary artery disease and stenting in the past was scheduled for Christine cardiac catheterization, which was done on 09/16. While hospitalized the patient did have bouts of bradycardia, which were asymptomatic.  Her atenolol has been held. Upon further conversation with the patient it appears that even the low dose 12.5-mg atenolol caused significant bradycardia. At this point she will be discharged without atenolol and if blood pressure is uncontrolled perhaps amlodipine or another medication can be started as an outpatient for the  blood pressure.  Gas-X did relieve her bloating and symptoms and after its initiation the patient had no further symptoms. At this point she will be followed up with an outpatient visit to her cardiologist. She should follow up with her primary care physician for blood pressure check-up as well.   TOTAL TIME SPENT: 35 minutes.      ____________________________ Vivien Presto, MD sa:bjt D: 01/05/2012 15:04:25 ET T: 01/06/2012 10:56:46 ET JOB#: [id number]  cc: Vivien Presto, MD, <Dictator> Youlanda Roys. Lovie Macadamia, MD Vivien Presto MD ELECTRONICALLY SIGNED 01/09/2012 14:22

## 2014-08-11 NOTE — Consult Note (Signed)
PATIENT NAME:  Christine Obrien, Christine Obrien MR#:  [id number] DATE OF BIRTH:  01/24/1941  DATE OF CONSULTATION:  03/30/2013  REFERRING PHYSICIAN:  Dr. Robet Leu CONSULTING PHYSICIAN:  Corey Skains, MD  REASON FOR CONSULTATION: Acute unstable angina and/or concerns for myocardial infarction.   CHIEF COMPLAINT:  "I had chest pain."  HISTORY OF PRESENT ILLNESS: This is a 74 year old female with known hypertension, hyperlipidemia, coronary artery disease status post previous myocardial infarction and stenting who has had acute onset of substernal chest discomfort radiating into her arms with diaphoresis and lasting approximately 2 hours. She had an EKG showing left bundle branch block with abnormal T-wave changes in the inferior leads consistent with myocardial ischemia, now with chest pain relief. The patient has had improvements of those EKG changes. The patient has not had any other further symptoms at this time.   REVIEW OF SYSTEMS: The remainder of review of systems negative for vision change, ringing in the ears, hearing loss, cough, congestion, heartburn, nausea, vomiting, diarrhea, bloody stools, stomach pain, extremity pain, leg weakness, cramping of the buttocks, known blood clots, headaches have been positive for the last several weeks, blackouts, dizzy spells, nosebleeds, congestion, trouble swallowing, frequent urination, urination at night.   PAST MEDICAL HISTORY: 1.  Coronary artery disease.  2.  Hypertension.  3.  Hyperlipidemia.  4.  Old myocardial infarction.   FAMILY HISTORY: Positive for family members with cardiovascular disease and hypertension.   SOCIAL HISTORY: Smokes 1 pack per day for 50 years. Occasionally drinks alcohol.   ALLERGIES:  As listed.   MEDICATIONS:  As listed.   PHYSICAL EXAMINATION: VITAL SIGNS: Blood pressure is 100/64 bilaterally, heart rate 78 while reclining and regular.  GENERAL: She is a well-appearing female in no acute distress.  HEAD, EYES, EARS, NOSE  AND THROAT:  No icterus, no  CARDIOVASCULAR:  Regular rate and rhythm. Normal S1, S2 with a 2/6 to 3/6 left upper sternal border murmur radiating throughout. PMI is diffuse. Carotid upstroke normal without bruit. Jugular venous appears normal.  LUNGS: Have few basilar crackles with normal respirations.  ABDOMEN: Soft, nontender, without hepatosplenomegaly or masses. Abdominal aorta is normal size without bruit.  EXTREMITIES:  Show 2+  , radial femoral arteries without lower extremity edema, cyanosis or clubbing   NEUROLOGIC: She is oriented to time, place and person, with normal mood and affect.   ASSESSMENT: A 74 year old female with coronary artery disease, hypertension, hyperlipidemia with previous myocardial infarction having acute onset of chest pain, shortness of breath consistent with unstable angina or non-ST elevation myocardial infarction, although troponins have been RECOMMENDATIONS: 1.  admit to telemetry. 2. serial ECG and e to assess for myocardial infarction with use of aspirin, nitrates for chest pain, myocardial infarction.  3.  Continue beta blocker for hypertension control. 4.  Probable proceed to cardiac catheterization for further risk r in assessmeatment of coronary artery disease and myocardial infarction. Patient understanatheterization. This includes the possibility of  that stroke heart attack ineeding or blood clot. The patient understands this and is at low risk for conscious sedation  ____________________________ Corey Skains, MD bjk:ce D: 03/30/2013 16:54:36 ET T: 03/30/2013 17:14:51 ET JOB#: [id number]  cc: Corey Skains, MD, <Dictator> Corey Skains MD ELECTRONICALLY SIGNED 03/31/2013 16:56

## 2014-08-12 NOTE — H&P (Signed)
PATIENT NAME:  Christine Obrien, Christine Obrien MR#:  [id number] DATE OF BIRTH:  01-Dec-1940  DATE OF ADMISSION:  03/30/2013  ADMITTING PHYSICIAN:  Gladstone Lighter, MD  PRIMARY CARE PHYSICIAN: Dr. Lovie Macadamia  CHIEF COMPLAINT:  Headache and bilateral arm pain.   HISTORY OF PRESENT ILLNESS:  Ms. Maniaci is a 74 year old very pleasant Caucasian female with past medical history significant for hypertension and diabetes, both are diet-controlled, history of temporal headaches that usually start in the periorbital region and temporal region radiating to the occipital region. She is being followed by a neurologist for the same and her frequency of the headaches have improved recently. She was at [facility]. She was shopping with her family and  suddenly she had an episode of facial pain instead of the periorbital pain like she usually gets. The pain and was referred to the back and then was going down between her shoulder blades and then she started to have bilateral arm burning sensation going down into her abdomen. Denies any chest pain, but this episode was associated with dyspnea, diaphoresis,  nausea, feeling cold and clammy which is completely not unusual for her. The pain lasted for almost 2 hours, so she presented to the ER. Her first set of troponins were negative. She had an EKG which shows left bundle branch block but concerning ST elevations in the inferior leads. Once her pain was better, repeat EKG showed ST depressions in the same leads. So she is being admitted for unstable angina with EKG changes, possible NSTEMI and will need a cardiac catheterization. She was already seen by cardiologist, Dr. Nehemiah Massed, in the ER.   PAST MEDICAL HISTORY: 1.  Coronary artery disease status post stents.  2.  Depression.  3.  Diet-controlled hypertension.  4.  Diet-controlled diabetes mellitus.  5.  Hyperlipidemia. 6.  Hypothyroidism.  7.  Osteoporosis.   PAST SURGICAL HISTORY: 1.  Hysterectomy. 2.  Cholecystectomy.  3.  Cardiac  stent placement.   ALLERGIES TO MEDICATIONS:  SULFA.  HOME MEDICATIONS:  1.  Xanax 0.5 mg p.o. b.i.d. p.r.n. for anxiety.  2.  Aspirin 81 mg p.o. daily.  3.  Celexa 10 mg p.o. daily.  4.  Crestor 5 mg p.o. on Monday, Wednesday, Friday.  5.  Synthroid 100 mcg p.o. daily.  6.  Nitroglycerin sublingual 0.4 mg p.r.n. for chest pain.  7.  Prilosec 20 mg p.o. daily.  8.  Tramadol 50 mg q. 6 hours p.r.n. for pain.   SOCIAL HISTORY:  Lives at home with her husband, continues to smoke about 1 pack per day. No alcohol use.   FAMILY HISTORY: Significant for cardiac disease.  REVIEW OF SYSTEMS:  CONSTITUTIONAL: No fever, fatigue or weakness.  EYES: Positive for blurry vision and also had cataract surgery. No inflammation or glaucoma.  ENT: No tinnitus, ear pain, hearing loss, epistaxis or discharge.  RESPIRATORY: No cough, wheeze, hemoptysis or COPD.  CARDIOVASCULAR: No chest pain, orthopnea, edema,  arrhythmia, palpitations or syncope.  GASTROINTESTINAL: No nausea, vomiting, diarrhea, abdominal pain, hematemesis or melena.  GENITOURINARY: No dysuria, hematuria, renal calculus, frequency or incontinence.  ENDOCRINE: No polyuria, nocturia, or thyroid problems, heat or cold intolerance.  HEMATOLOGY: No anemia, easy bruising or bleeding.  SKIN: No acne, rash or lesion. MUSCULOSKELETAL: no gout or arthritis NEUROLOGIC: No numbness, weakness, CVA, TIA or seizures. Positive for temporal headaches.  PSYCHOLOGICAL: No anxiety, insomnia, depression.   PHYSICAL EXAMINATION: VITAL SIGNS: Temperature afebrile, pulse 66, respirations 18, blood pressure 127/56, pulse oximetry 95% on room air.  GENERAL: Well-built, well-nourished female lying in bed, not in any acute distress.  HEENT: Normocephalic, atraumatic. Pupils equal, round, reacting to light. Anicteric sclerae. Extraocular movements intact. Oropharynx clear without erythema, mass or exudates.  NECK: Supple. No thyromegaly, JVD, carotid bruits. No  lymphadenopathy.  LUNGS: Moving air bilaterally. No wheeze or crackles. No use of accessory muscles for breathing.  CARDIOVASCULAR: S1, S2 regular rate and rhythm, has a 3/6 systolic murmur. No rubs or gallops.  ABDOMEN: Soft, nontender, nondistended. No hepatosplenomegaly. Normal bowel sounds. EXTREMITIES:  No pedal edema. No clubbing or cyanosis. 2+ dorsalis pedis pulses palpable bilaterally.  SKIN: No acne, rash or lesions. LYMPHATICS:  No cervical or inguinal lymphadenopathy. NEUROLOGICAL:  Cranial nerves II through XII remain intact. Gross motor strength is 5/5 in all 4 extremities. Sensation intact.  PSYCHOLOGICAL: The patient is awake, alert, oriented x 3.   LAB DATA: WBC 8.5, hemoglobin 12.9, hematocrit 37.3, platelet count 187.   Sodium 138, potassium 3.8, chloride 106, bicarbonate 26, BUN 13, creatinine 0.92, glucose 140 and calcium of 9.3.  ALT 15, AST 13, alk phos 90, total bili 0.2 and albumin of 3.3.  CT of the head without contrast showing no acute intracranial abnormality, partial opacification of bilateral mastoid cells.  Chest x-ray showing clear lung fields.  No active cardiopulmonary disease. First set of troponin less than 0.02, magnesium 2.0, PTT 27.7. INR is 1.0. EKG showing left bundle branch block, normal sinus rhythm. ST elevations seen in II, III and also aVF with ST depression seen in lateral leads I and aVL. Repeat EKG after the chest pain was resolved showing ST depressions in the inferior leads.   ASSESSMENT AND PLAN: This is a 74 year old female with history of hypertension, diabetes and prior history of coronary artery disease, status post stents was brought in secondary to atypical pain in the arm.  1.  Possible unstable angina with EKG changes. Admit to telemetry. Recycle cardiac enzymes. Cardiology has been consulted. Start on IV heparin drip with her EKG changes, likely cardiac catheterization tomorrow morning. Continue her aspirin, nitro p.r.n., added  metoprolol. Continue her statin and lisinopril at this time. Will checked HbA1c, lipid profile for risk stratification. 2.  Hypertension: Added low dose metoprolol.  3.  Diabetes mellitus on sliding scale insulin. Check HbA1c, diet-controlled at home.  4.  Hypothyroidism.  Continue Synthroid.  5. Tobacco use disorder. Will started on Nicotrol inhaler. Counseled for 3 minutes against smoking. Is motivated to quit.  6.  Gastrointestinal and deep venous thrombosis prophylaxis.  CODE STATUS:  Full code.  TIME SPENT ON ADMISSION:  50 minutes    ____________________________ Gladstone Lighter, MD rk:ce D: 03/30/2013 18:29:39 ET T: 03/30/2013 18:54:17 ET JOB#: [id number]  cc: Gladstone Lighter, MD, <Dictator> Youlanda Roys. Lovie Macadamia, MD Dwayne D. Clayborn Bigness, MD Gladstone Lighter MD ELECTRONICALLY SIGNED 05/03/2013 11:17

## 2014-08-12 NOTE — Discharge Summary (Signed)
Obrien NAME:  Christine Obrien, Christine Obrien MR#:  [id number] DATE OF BIRTH:  03-03-1941  DATE OF ADMISSION:  03/30/2013 DATE OF DISCHARGE:  04/01/2013   CONSULTANTS: Dr. Nehemiah Massed from cardiology.   PRIMARY CARE PHYSICIAN:  Dr. Lovie Macadamia   CHIEF COMPLAINT:  Headache, bilateral arm pain.   DISCHARGE DIAGNOSES: 1.  ST elevation myocardial infarction with in-stent right coronary artery stenosis status post stent with moderate left circumflex stenosis.  2.  Cardiomyopathy, possibly in Christine setting of myocardial infarction, also possible myocardium stunning in Christine setting of ST elevation myocardial infarction.  3.  Borderline diabetes.  4.  Diabetes, diet controlled.  5.  Hypertension, diet controlled.  6.  Hypothyroidism.  7.  Cardiomyopathy with suspected ejection fraction less than 40% preliminary.  8.  Depression.  9.  Hyperlipidemia.  10.  Osteoporosis.  11.  Tobacco abuse.   DISCHARGE MEDICATIONS: Levothyroxine 100 mcg daily, NitroQuick 1 tab sublingual every 5 minutes up to 3 doses as needed for pain in Christine chest, tramadol 50 mg every 6 hours as needed for pain, alprazolam 0.5 mg every 12 hours as needed for anxiety, omeprazole 20 mg once a day in Christine evening, citalopram 10 mg daily, aspirin 81 mg daily, Crestor 5 mg at bedtime Monday, Wednesday, Fridays, lisinopril 2.5 mg daily, Prasugrel 10 mg once a day, carvedilol 3.125 mg 2 times a day.   DIET: Low-fat, low-sodium, low-cholesterol, ADA diet.   ACTIVITY: As tolerated.   FOLLOWUP: Please follow with PCP within 1 to 2 weeks. Please follow with your cardiologist within a week.   DISPOSITION: Home.  SIGNIFICANT LABORATORIES AND IMAGING:  Echocardiogram still pending. Initial BUN 13, creatinine 0.92, sodium 138, potassium 3.8. Initial troponin negative. Next troponin jumped to 2.9 and 3rd troponin was 8, last troponin of 7.8. Initial CK-MB of 0.9, peak CK-MB of 47, last CK-MB of 16. TSH was 0.26. Initial white count of 8.5. UA not suggesting an  infection.  CT of Christine head without contrast for Christine headache, showing no acute intracranial abnormalities, partial opacification of bilateral mastoid air cells.  Chest x-ray, one view, showing no active disease.  HISTORY OF PRESENT ILLNESS AND HOSPITAL COURSE:  For full details of H and P, please see Christine dictation on 12/10 by Dr. Tressia Miners; but briefly, this is a Christine Obrien with history of CAD status post MI in Christine past, depression, diet-controlled hypertension and diabetes and ongoing tobacco abuse. She came into Christine hospital for headache, bilateral arm pain. She had a negative troponin but had EKG, which showed a left bundle branch block concerning for ST elevations in inferior leads. Once her pain was better, repeat EKG showed ST depressions in Christine same leads. She was admitted to Christine hospitalist service. She was started on aspirin, nitroglycerin and heparin drip for suspected unstable angina/non-ST elevation MI. She was seen by cardiology, Dr. Nehemiah Massed. Christine initial EKG was actually concerning for ST elevation MI with ST elevation which had resolved in 2nd EKG.  She ultimately underwent a cardiac cath showing RCA stent restenosis and Christine Obrien had another stent in place. She did not have any further episodes of chest pain. An echocardiogram has been performed, but Christine result is not back yet. However, unofficial data suggests that Christine EF is possibly less than 40%. She has been started on a low-dose ACE inhibitor, as well as Coreg. She was started on low-dose metoprolol 12.5 mg but had some bradycardic episodes, mostly when she was sleeping and, hence, that was stopped and she was  transitioned into 3.125 mg of Coreg. She appears to tolerate Christine Coreg. In regards to Christine suspected lower EF, again, we still do not have Christine official echo results but she has been discharged on Coreg and lisinopril. It is possible that this is post ST elevation MI and myocardium stunning and cardiomyopathy, but that is not  clear. Christine Obrien is aware that she should follow with her cardiologist. An appointment has been made. She was strongly counseled against tobacco, and she will attempt to stop as she was warned that ongoing tobacco abuse can again likely cause stenosis or thrombosis of her stent.  She was discharged on Prasugrel as dictated above. Will reschedule for a stress test by her cardiologist as an outpatient, likely per cardiology's notes. In regards to  hypothyroidism TSH was checked and it was 0.266; however, in Christine setting of this acute MI, a full TFT should be checked as an outpatient. At this point, without significant shortness of breath or chest pains with ambulation, she will be discharged.  TOTAL TIME SPENT:  40 minutes.  Christine Obrien is full code.   ____________________________ Vivien Presto, MD sa:ce D: 04/01/2013 13:46:10 ET T: 04/01/2013 13:57:24 ET JOB#: [id number]  cc: Vivien Presto, MD, <Dictator> Youlanda Roys. Lovie Macadamia, MD Corey Skains, MD Vivien Presto MD ELECTRONICALLY SIGNED 05/03/2013 11:02

## 2015-04-06 DIAGNOSIS — M5412 Radiculopathy, cervical region: Secondary | ICD-10-CM | POA: Insufficient documentation

## 2015-04-06 DIAGNOSIS — M5030 Other cervical disc degeneration, unspecified cervical region: Secondary | ICD-10-CM | POA: Insufficient documentation

## 2015-07-13 DIAGNOSIS — K219 Gastro-esophageal reflux disease without esophagitis: Secondary | ICD-10-CM | POA: Insufficient documentation

## 2015-08-24 DIAGNOSIS — F411 Generalized anxiety disorder: Secondary | ICD-10-CM | POA: Insufficient documentation

## 2016-01-29 ENCOUNTER — Other Ambulatory Visit: Payer: Self-pay | Admitting: Otolaryngology

## 2016-01-29 DIAGNOSIS — R1310 Dysphagia, unspecified: Secondary | ICD-10-CM

## 2016-02-18 ENCOUNTER — Ambulatory Visit
Admission: RE | Admit: 2016-02-18 | Discharge: 2016-02-18 | Disposition: A | Discharge status: Home / Self Care | Payer: Medicare Other | Source: Ambulatory Visit | Attending: Otolaryngology | Admitting: Otolaryngology

## 2016-02-18 DIAGNOSIS — R1310 Dysphagia, unspecified: Secondary | ICD-10-CM | POA: Diagnosis not present

## 2016-02-18 DIAGNOSIS — R05 Cough: Secondary | ICD-10-CM | POA: Insufficient documentation

## 2016-02-18 DIAGNOSIS — J384 Edema of larynx: Secondary | ICD-10-CM | POA: Insufficient documentation

## 2016-02-18 DIAGNOSIS — R1314 Dysphagia, pharyngoesophageal phase: Secondary | ICD-10-CM

## 2016-02-18 NOTE — Therapy (Signed)
[place], [place], [address] Phone: [phone]   Fax:     Modified Barium Swallow  Patient Details  Name: Christine Obrien MRN: [id number] Date of Birth: 10/03/1940 No Data Recorded  Encounter Date: 02/18/2016      End of Session - 02/18/16 1327    Visit Number 1   Number of Visits 1   Date for SLP Re-Evaluation 02/18/16   SLP Start Time 1245   SLP Stop Time  [id number]   SLP Time Calculation (min) 43 min   Activity Tolerance Patient tolerated treatment well      No past medical history on file.  No past surgical history on file.  There were no vitals filed for this visit.   Subjective: Patient behavior: (alertness, ability to follow instructions, etc.): Patient is alert, able to follow directions, and verbalizes her swallowing history.  Chief complaint: chokes and coughs, often with food   Objective:  Radiological Procedure: A videoflouroscopic evaluation of oral-preparatory, reflex initiation, and pharyngeal phases of the swallow was performed; as well as a screening of the upper esophageal phase.  I. POSTURE: Upright in MBS chair  II. VIEW: Lateral  III. COMPENSATORY STRATEGIES: N/A  IV. BOLUSES ADMINISTERED:   Thin Liquid: 2 small sips, 3 rapid consecutive sips   Nectar-thick Liquid: 1 moderate size sip    Puree: 2 teaspoon presentations   Mechanical Soft: 1/4 graham cracker in applesauce   Barium tablet  V. RESULTS OF EVALUATION: A. ORAL PREPARATORY PHASE: (The lips, tongue, and velum are observed for strength and coordination)       **Overall Severity Rating: Within functional limits, some dryness causing barium tablet to stick at mid-tongue momentarily  B. SWALLOW INITIATION/REFLEX: (The reflex is normal if "triggered" by the time the bolus reached the base of the tongue)  **Overall Severity Rating: Within normal limits  C. PHARYNGEAL PHASE: (Pharyngeal function is normal  if the bolus shows rapid, smooth, and continuous transit through the pharynx and there is no pharyngeal residue after the swallow)  **Overall Severity Rating: Within normal limits  D. LARYNGEAL PENETRATION: (Material entering into the laryngeal inlet/vestibule but not aspirated) None  E. ASPIRATION: None  F. ESOPHAGEAL PHASE: (Screening of the upper esophagus) .  In the cervical esophagus there is a protrusion along the posterior wall during swallow (does not impede flow of boluses) consistent with prominent cricopharyngeus.    ASSESSMENT: This 75 year old woman, with edema of the larynx and progressing dysphagia for solids, is presenting with functional oropharyngeal swallowing.  Oral control of the bolus including oral hold, rotary mastication, and anterior to posterior transfer are within normal limits. A barium tablet adhered to lingual surface, but moved posterior quickly with second swallow of water.  Timing of the pharyngeal swallow is within normal limits.  Aspects of the pharyngeal stage of swallowing including tongue base retraction, hyolaryngeal excursion, epiglottic inversion, duration/amplitude of UES opening, and laryngeal vestibule closure at the height of the swallow are within normal limits.  There is no pharyngeal residue.  There was no observed laryngeal penetration or aspiration.  In the cervical esophagus there is a protrusion along the posterior wall during swallow (does not impede flow of boluses) consistent with prominent cricopharyngeus.    PLAN/RECOMMENDATIONS:   A. Diet: Regular, soften and moisten as needed   B. Swallowing Precautions: Reflux precautions   C. Recommended consultation to: follow up with Dr. Pryor Ochoa as recommended   D. Therapy recommendations  N/A   E. Results and recommendations were discussed with the patient immediately following the study and the final report was routed to the referring MD.     Pharyngoesophageal dysphagia - Plan: DG OP  Swallowing Func-Medicare/Speech Path, DG OP Swallowing Func-Medicare/Speech Path      G-Codes - 02-29-2016 1328    Functional Assessment Tool Used MBS, clinical judgment   Functional Limitations Swallowing   Swallow Current Status [id number]) At least 1 percent but less than 20 percent impaired, limited or restricted   Swallow Goal Status [id number]) At least 1 percent but less than 20 percent impaired, limited or restricted   Swallow Discharge Status [phone]) At least 1 percent but less than 20 percent impaired, limited or restricted          Problem List There are no active problems to display for this patient.  Leroy Sea, MS/CCC- SLP  Lou Miner 29-Feb-2016, 1:29 PM  [facility] [place], [place], [address] Phone: [phone]   Fax:     Name: Christine Obrien MRN: [id number] Date of Birth: 1940/04/27

## 2016-03-18 ENCOUNTER — Other Ambulatory Visit (INDEPENDENT_AMBULATORY_CARE_PROVIDER_SITE_OTHER): Payer: Self-pay | Admitting: Vascular Surgery

## 2016-03-18 DIAGNOSIS — I6523 Occlusion and stenosis of bilateral carotid arteries: Secondary | ICD-10-CM

## 2016-03-25 ENCOUNTER — Encounter (INDEPENDENT_AMBULATORY_CARE_PROVIDER_SITE_OTHER): Payer: Self-pay | Admitting: Vascular Surgery

## 2016-03-25 ENCOUNTER — Ambulatory Visit (INDEPENDENT_AMBULATORY_CARE_PROVIDER_SITE_OTHER): Payer: Medicare Other | Admitting: Vascular Surgery

## 2016-03-25 ENCOUNTER — Ambulatory Visit (INDEPENDENT_AMBULATORY_CARE_PROVIDER_SITE_OTHER): Payer: Medicare Other

## 2016-03-25 VITALS — BP 160/87 | HR 83 | Resp 16 | Ht 63.0 in | Wt 139.0 lb

## 2016-03-25 DIAGNOSIS — E118 Type 2 diabetes mellitus with unspecified complications: Secondary | ICD-10-CM

## 2016-03-25 DIAGNOSIS — I6529 Occlusion and stenosis of unspecified carotid artery: Secondary | ICD-10-CM | POA: Insufficient documentation

## 2016-03-25 DIAGNOSIS — I6523 Occlusion and stenosis of bilateral carotid arteries: Secondary | ICD-10-CM | POA: Diagnosis not present

## 2016-03-25 DIAGNOSIS — E119 Type 2 diabetes mellitus without complications: Secondary | ICD-10-CM | POA: Insufficient documentation

## 2016-03-25 NOTE — Progress Notes (Signed)
MRN : [id number]  Christine Obrien is a 75 y.o. (05-05-1940) female who presents with chief complaint of  Chief Complaint  Patient presents with  . Carotid    6 month ultrasound  .  History of Present Illness: Patient returns in follow-up of her carotid disease. She is doing well and denies focal neurologic symptoms. Specifically, the patient denies amaurosis fugax, speech or swallowing difficulties, or arm or leg weakness or numbness.  Duplex ultrasound shows 1-39% right carotid artery stenosis and 40-59% left carotid artery stenosis. This is stable from her previous study 6 months ago.  Current Outpatient Prescriptions  Medication Sig Dispense Refill  . ALPRAZolam (XANAX) 0.5 MG tablet     . aspirin EC 81 MG tablet Take 81 mg by mouth.    . citalopram (CELEXA) 20 MG tablet AT THE START OF THERAPY, TAKE ONE-HALF (1/2) TABLET DAILY, CAN INCREASE TO 1 TABLET DAILY THEREAFTER IF NEEDED AFTER 2 TO 3 WEEKS    . ferrous sulfate 325 (65 FE) MG tablet   0  . fluticasone (FLONASE) 50 MCG/ACT nasal spray instill 2 sprays into each nostril once daily    . LEVOXYL 112 MCG tablet     . metFORMIN (GLUCOPHAGE) 500 MG tablet Take by mouth.    . nitroGLYCERIN (NITROSTAT) 0.4 MG SL tablet 1 tablet(s) sublingual as needed for chest pain (may repeat every 5 minutes but seek medical help if pain persists after 3 tablets)    . omeprazole (PRILOSEC) 20 MG capsule     . ondansetron (ZOFRAN) 8 MG tablet Take 8 mg by mouth.    . oxybutynin (DITROPAN-XL) 10 MG 24 hr tablet Take by mouth.    . sucralfate (CARAFATE) 1 g tablet Take by mouth.    . traMADol (ULTRAM) 50 MG tablet Take by mouth.     No current facility-administered medications for this visit.     Past Medical History:  Diagnosis Date  . Carotid artery occlusion   . Hypertension     Past Surgical History:  Procedure Laterality Date  . CHOLECYSTECTOMY      Social History Social History  Substance Use Topics  . Smoking status: Current Every  Day Smoker  . Smokeless tobacco: Never Used  . Alcohol use No    Family History No bleeding or clotting disorders  Allergies  Allergen Reactions  . Ace Inhibitors     Other reaction(s): Cough  . Atorvastatin     Other reaction(s): Muscle Pain  . Statins     Other reaction(s): Muscle Pain Muscle pain  . Sulfa Antibiotics     Other reaction(s): Unknown     REVIEW OF SYSTEMS (Negative unless checked)  Constitutional: [] Weight loss  [] Fever  [] Chills Cardiac: [] Chest pain   [] Chest pressure   [] Palpitations   [] Shortness of breath when laying flat   [] Shortness of breath at rest   [] Shortness of breath with exertion. Vascular:  [] Pain in legs with walking   [] Pain in legs at rest   [] Pain in legs when laying flat   [] Claudication   [] Pain in feet when walking  [] Pain in feet at rest  [] Pain in feet when laying flat   [] History of DVT   [] Phlebitis   [] Swelling in legs   [] Varicose veins   [] Non-healing ulcers Pulmonary:   [] Uses home oxygen   [] Productive cough   [] Hemoptysis   [] Wheeze  [] COPD   [] Asthma Neurologic:  [] Dizziness  [] Blackouts   [] Seizures   [] History of  stroke   [] History of TIA  [] Aphasia   [] Temporary blindness   [] Dysphagia   [] Weakness or numbness in arms   [] Weakness or numbness in legs Musculoskeletal:  [] Arthritis   [] Joint swelling   [] Joint pain   [] Low back pain Hematologic:  [] Easy bruising  [] Easy bleeding   [] Hypercoagulable state   [] Anemic  [] Hepatitis Gastrointestinal:  [] Blood in stool   [] Vomiting blood  [] Gastroesophageal reflux/heartburn   [] Difficulty swallowing. Genitourinary:  [] Chronic kidney disease   [] Difficult urination  [] Frequent urination  [] Burning with urination   [] Blood in urine Skin:  [] Rashes   [] Ulcers   [] Wounds Psychological:  [] History of anxiety   []  History of major depression.  Physical Examination  Vitals:   03/25/16 1342 03/25/16 1343  BP: (!) 161/86 (!) 160/87  Pulse: 83   Resp: 16   Weight: 139 lb (63 kg)     Height: 5\' 3"  (1.6 m)    Body mass index is 24.62 kg/m. Gen:  WD/WN, NAD Head: [place]/AT, No temporalis wasting. Ear/Nose/Throat: Hearing grossly intact, nares w/o erythema or drainage, trachea midline Eyes: Conjunctiva clear. Sclera non-icteric Neck: Supple.  No bruit or JVD.  Pulmonary:  Good air movement, equal and clear to auscultation bilaterally.  Cardiac: RRR, normal S1, S2, no Murmurs, rubs or gallops. Vascular:  Vessel Right Left  Radial Palpable Palpable  Ulnar Palpable Palpable  Brachial Palpable Palpable  Carotid Palpable, without bruit Palpable, without bruit  Aorta Not palpable N/A  Femoral Palpable Palpable  Popliteal Palpable Palpable  PT Palpable Palpable  DP Palpable Palpable   Gastrointestinal: soft, non-tender/non-distended. No guarding/reflex.  Musculoskeletal: M/S 5/5 throughout.  No deformity or atrophy.  Neurologic: CN 2-12 intact. Sensation grossly intact in extremities.  Symmetrical.  Speech is fluent. Motor exam as listed above. Psychiatric: Judgment intact, Mood & affect appropriate for pt's clinical situation. Dermatologic: No rashes or ulcers noted.  No cellulitis or open wounds. Lymph : No Cervical, Axillary, or Inguinal lymphadenopathy.     CBC Lab Results  Component Value Date   WBC 7.1 03/31/2013   HGB 11.8 (L) 03/31/2013   HCT 35.9 03/31/2013   MCV 88 03/31/2013   PLT 166 03/31/2013    BMET    Component Value Date/Time   NA 142 04/01/2013 0456   K 3.6 04/01/2013 0456   CL 110 (H) 04/01/2013 0456   CO2 26 04/01/2013 0456   GLUCOSE 93 04/01/2013 0456   BUN 7 04/01/2013 0456   CREATININE 0.73 04/01/2013 0456   CALCIUM 9.0 04/01/2013 0456   GFRNONAA >60 04/01/2013 0456   GFRAA >60 04/01/2013 0456   CrCl cannot be calculated (Patient's most recent lab result is older than the maximum 21 days allowed.).  COAG Lab Results  Component Value Date   INR 1.0 03/30/2013   INR 0.9 01/02/2012    Radiology No results  found.    Assessment/Plan Diabetes (HCC) blood glucose control important in reducing the progression of atherosclerotic disease. Also, involved in wound healing. On appropriate medications.   Carotid stenosis Recommend:  Given the patient's asymptomatic subcritical stenosis no further invasive testing or surgery at this time.  Duplex ultrasound shows 1-39% right carotid artery stenosis and 40-59% left carotid artery stenosis. This is stable from her previous study 6 months ago.  Continue antiplatelet therapy as prescribed Continue management of CAD, HTN and Hyperlipidemia Healthy heart diet,  encouraged exercise at least 4 times per week Follow up in 1 year with carotid duplex  Leotis Pain, MD  03/25/2016 2:10 PM    This note was created with Dragon medical transcription system.  Any errors from dictation are purely unintentional

## 2016-03-25 NOTE — Assessment & Plan Note (Signed)
Recommend:  Given the patient's asymptomatic subcritical stenosis no further invasive testing or surgery at this time.  Duplex ultrasound shows 1-39% right carotid artery stenosis and 40-59% left carotid artery stenosis. This is stable from her previous study 6 months ago.  Continue antiplatelet therapy as prescribed Continue management of CAD, HTN and Hyperlipidemia Healthy heart diet,  encouraged exercise at least 4 times per week Follow up in 1 year with carotid duplex

## 2016-03-25 NOTE — Assessment & Plan Note (Signed)
blood glucose control important in reducing the progression of atherosclerotic disease. Also, involved in wound healing. On appropriate medications.  

## 2016-03-25 NOTE — Patient Instructions (Signed)

## 2016-11-18 ENCOUNTER — Other Ambulatory Visit: Payer: Self-pay | Admitting: Otolaryngology

## 2016-11-18 DIAGNOSIS — R42 Dizziness and giddiness: Secondary | ICD-10-CM

## 2016-12-02 ENCOUNTER — Ambulatory Visit
Admission: RE | Admit: 2016-12-02 | Discharge: 2016-12-02 | Disposition: A | Discharge status: Home / Self Care | Payer: Medicare Other | Source: Ambulatory Visit | Attending: Otolaryngology | Admitting: Otolaryngology

## 2016-12-02 ENCOUNTER — Encounter: Payer: Self-pay | Admitting: Radiology

## 2016-12-02 DIAGNOSIS — R42 Dizziness and giddiness: Secondary | ICD-10-CM | POA: Diagnosis not present

## 2016-12-02 LAB — POCT I-STAT CREATININE: CREATININE: 1.1 mg/dL — AB (ref 0.44–1.00)

## 2016-12-02 MED ORDER — GADOBENATE DIMEGLUMINE 529 MG/ML IV SOLN
15.0000 mL | Freq: Once | INTRAVENOUS | Status: AC | PRN
  Administered 2016-12-02: 12 mL via INTRAVENOUS

## 2017-03-06 DIAGNOSIS — M7541 Impingement syndrome of right shoulder: Secondary | ICD-10-CM | POA: Insufficient documentation

## 2017-03-27 ENCOUNTER — Ambulatory Visit (INDEPENDENT_AMBULATORY_CARE_PROVIDER_SITE_OTHER): Payer: Medicare Other

## 2017-03-27 ENCOUNTER — Encounter (INDEPENDENT_AMBULATORY_CARE_PROVIDER_SITE_OTHER): Payer: Self-pay | Admitting: Vascular Surgery

## 2017-03-27 ENCOUNTER — Encounter (INDEPENDENT_AMBULATORY_CARE_PROVIDER_SITE_OTHER): Payer: Self-pay

## 2017-03-27 ENCOUNTER — Ambulatory Visit (INDEPENDENT_AMBULATORY_CARE_PROVIDER_SITE_OTHER): Payer: Medicare Other | Admitting: Vascular Surgery

## 2017-03-27 VITALS — BP 159/82 | HR 72 | Resp 17 | Wt 146.0 lb

## 2017-03-27 DIAGNOSIS — E118 Type 2 diabetes mellitus with unspecified complications: Secondary | ICD-10-CM | POA: Diagnosis not present

## 2017-03-27 DIAGNOSIS — I6523 Occlusion and stenosis of bilateral carotid arteries: Secondary | ICD-10-CM

## 2017-03-27 DIAGNOSIS — I739 Peripheral vascular disease, unspecified: Secondary | ICD-10-CM

## 2017-03-27 NOTE — Progress Notes (Signed)
MRN : [id number]  Christine Obrien is a 77 y.o. (08-23-1940) female who presents with chief complaint of  Chief Complaint  Patient presents with  . Carotid    34yr follow up   History of Present Illness:  Patient presents for a yearly carotid artery stenosis follow-up.  The patient presents without complaint with the exception of some left lower extremity claudication discomfort.  Patient notes pain with ambulation starting in the left hip radiating down the left leg.  Patient believes it to be "sciatica".  He denies any right lower extremity symptoms.  She denies any rest pain or ulceration to the lower extremity. The patient denies experiencing Amaurosis Fugax, TIA like symptoms or focal motor deficits.  The patient underwent a bilateral carotid artery duplex exam which was notable for no significant change in the bilateral carotid arteries when compared to the previous exam on March 25, 2016.  Doppler velocities suggest 1-39% right proximal internal carotid artery stenosis and a 40-59% left proximal internal carotid artery stenosis.  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  At this time, the patient does not experience any left upper extremity pain.   Current Outpatient Prescriptions  Medication Sig Dispense Refill  . ALPRAZolam (XANAX) 0.5 MG tablet     . aspirin EC 81 MG tablet Take 81 mg by mouth.    . citalopram (CELEXA) 20 MG tablet AT THE START OF THERAPY, TAKE ONE-HALF (1/2) TABLET DAILY, CAN INCREASE TO 1 TABLET DAILY THEREAFTER IF NEEDED AFTER 2 TO 3 WEEKS    . ferrous sulfate 325 (65 FE) MG tablet   0  . fluticasone (FLONASE) 50 MCG/ACT nasal spray instill 2 sprays into each nostril once daily    . LEVOXYL 112 MCG tablet     . metFORMIN (GLUCOPHAGE) 500 MG tablet Take by mouth.    . nitroGLYCERIN (NITROSTAT) 0.4 MG SL tablet 1 tablet(s) sublingual as needed for chest pain (may repeat every 5 minutes but seek medical help if pain persists after 3  tablets)    . omeprazole (PRILOSEC) 20 MG capsule     . ondansetron (ZOFRAN) 8 MG tablet Take 8 mg by mouth.    . oxybutynin (DITROPAN-XL) 10 MG 24 hr tablet Take by mouth.    . sucralfate (CARAFATE) 1 g tablet Take by mouth.    . traMADol (ULTRAM) 50 MG tablet Take by mouth.     No current facility-administered medications for this visit.    Past Medical History:  Diagnosis Date  . Carotid artery occlusion   . Hypertension         Past Surgical History:  Procedure Laterality Date  . CHOLECYSTECTOMY     Social History     Social History  Substance Use Topics  . Smoking status: Current Every Day Smoker  . Smokeless tobacco: Never Used  . Alcohol use No   Family History No bleeding or clotting disorders       Allergies  Allergen Reactions  . Ace Inhibitors     Other reaction(s): Cough  . Atorvastatin     Other reaction(s): Muscle Pain  . Statins     Other reaction(s): Muscle Pain Muscle pain  . Sulfa Antibiotics     Other reaction(s): Unknown   REVIEW OF SYSTEMS (Negative unless checked)  Constitutional: [] Weight loss  [] Fever  [] Chills Cardiac: [] Chest pain   [] Chest pressure   [] Palpitations   [] Shortness of breath when laying flat   [] Shortness of breath at  rest   [] Shortness of breath with exertion. Vascular:  [x] Pain in legs with walking   [] Pain in legs at rest   [] Pain in legs when laying flat   [] Claudication   [] Pain in feet when walking  [] Pain in feet at rest  [] Pain in feet when laying flat   [] History of DVT   [] Phlebitis   [] Swelling in legs   [] Varicose veins   [] Non-healing ulcers Pulmonary:   [] Uses home oxygen   [] Productive cough   [] Hemoptysis   [] Wheeze  [] COPD   [] Asthma Neurologic:  [] Dizziness  [] Blackouts   [] Seizures   [] History of stroke   [] History of TIA  [] Aphasia   [] Temporary blindness   [] Dysphagia   [] Weakness or numbness in arms   [] Weakness or numbness in legs Musculoskeletal:  [] Arthritis    [] Joint swelling   [] Joint pain   [] Low back pain Hematologic:  [] Easy bruising  [] Easy bleeding   [] Hypercoagulable state   [] Anemic  [] Hepatitis Gastrointestinal:  [] Blood in stool   [] Vomiting blood  [] Gastroesophageal reflux/heartburn   [] Difficulty swallowing. Genitourinary:  [] Chronic kidney disease   [] Difficult urination  [] Frequent urination  [] Burning with urination   [] Blood in urine Skin:  [] Rashes   [] Ulcers   [] Wounds Psychological:  [] History of anxiety   []  History of major depression.  Physical Examination  Vitals:   03/27/17 1403 03/27/17 1406  BP: (!) 148/76 (!) 159/82  Pulse: 72   Resp: 17   Weight: 146 lb (66.2 kg)    Body mass index is 25.86 kg/m. Gen:  WD/WN, NAD Head: [place]/AT, No temporalis wasting. Ear/Nose/Throat: Hearing grossly intact, nares w/o erythema or drainage, trachea midline.  No carotid bruits noted on exam Eyes: Conjunctiva clear. Sclera non-icteric Neck: Supple.  No bruit or JVD.  Pulmonary:  Good air movement, equal and clear to auscultation bilaterally.  Cardiac: RRR, normal S1, S2, no Murmurs, rubs or gallops. Vascular:  Vessel Right Left  Radial Palpable Palpable  Ulnar Palpable Palpable  Brachial Palpable Palpable  Carotid Palpable, without bruit Palpable, without bruit  Aorta Not palpable N/A  Femoral Palpable Palpable  Popliteal Palpable Palpable  PT Palpable Palpable  DP Palpable Palpable   Bilateral lower extremity: Faint pedal pulses noted.  Gastrointestinal: soft, non-tender/non-distended. No guarding/reflex.  Musculoskeletal: M/S 5/5 throughout.  No deformity or atrophy.  No edema. Neurologic: CN 2-12 intact. Sensation grossly intact in extremities.  Symmetrical.  Speech is fluent. Motor exam as listed above. Psychiatric: Judgment intact, Mood & affect appropriate for pt's clinical situation. Dermatologic: No rashes or ulcers noted.  No cellulitis or open wounds. Lymph : No Cervical, Axillary, or Inguinal  lymphadenopathy.  CBC Lab Results  Component Value Date   WBC 7.1 03/31/2013   HGB 11.8 (L) 03/31/2013   HCT 35.9 03/31/2013   MCV 88 03/31/2013   PLT 166 03/31/2013   BMET    Component Value Date/Time   NA 142 04/01/2013 0456   K 3.6 04/01/2013 0456   CL 110 (H) 04/01/2013 0456   CO2 26 04/01/2013 0456   GLUCOSE 93 04/01/2013 0456   BUN 7 04/01/2013 0456   CREATININE 1.10 (H) 12/02/2016 1055   CREATININE 0.73 04/01/2013 0456   CALCIUM 9.0 04/01/2013 0456   GFRNONAA >60 04/01/2013 0456   GFRAA >60 04/01/2013 0456   CrCl cannot be calculated (Patient's most recent lab result is older than the maximum 21 days allowed.).  COAG Lab Results  Component Value Date   INR 1.0 03/30/2013  INR 0.9 01/02/2012   Radiology No results found.  Assessment/Plan Patient presents for a yearly carotid artery stenosis follow-up.  The patient presents without complaint with the exception of some left lower extremity claudication discomfort.  Patient notes pain with ambulation starting in the left hip radiating down the left leg.  Patient believes it to be "sciatica".  He denies any right lower extremity symptoms.  She denies any rest pain or ulceration to the lower extremity. The patient denies experiencing Amaurosis Fugax, TIA like symptoms or focal motor deficits.  The patient underwent a bilateral carotid artery duplex exam which was notable for no significant change in the bilateral carotid arteries when compared to the previous exam on March 25, 2016.  Doppler velocities suggest 1-39% right proximal internal carotid artery stenosis and a 40-59% left proximal internal carotid artery stenosis.  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  At this time, the patient does not experience any left upper extremity pain.   1) Carotid Stenosis - Stable Studies reviewed with patient. Patient asymptomatic with stable duplex.   No intervention at this time.  Patient to return in one  year for surveillance carotid duplex. Patient to remain abstinent of tobacco use. I have discussed with the patient at length the risk factors for and pathogenesis of atherosclerotic disease and encouraged a healthy diet, regular exercise regimen and blood pressure / glucose control.  Patient was instructed to contact our office in the interim with problems such as arm / leg weakness or numbness, speech / swallowing difficulty or temporary monocular blindness. The patient expresses their understanding.   2) Diabetes Mellitus 2 - Stable Encouraged good control as its slows the progression of atherosclerotic disease  3) Left Lower Extremity Pain - New Patient experiencing left lower extremity claudication-like symptoms Faint pedal pulses on exam Will risk factors for peripheral artery disease We will bring the patient back at her convenience to undergo an ABI to assess for any contributing peripheral artery disease  KIMBERLY A STEGMAYER, PA-C  03/27/2017 2:24 PM  This note was created with Dragon medical transcription system.  Any errors from dictation are purely unintentional

## 2017-07-02 ENCOUNTER — Encounter: Payer: Self-pay | Admitting: Emergency Medicine

## 2017-07-02 ENCOUNTER — Emergency Department: Payer: Medicare Other

## 2017-07-02 ENCOUNTER — Other Ambulatory Visit: Payer: Self-pay

## 2017-07-02 ENCOUNTER — Inpatient Hospital Stay
Admission: EM | Admit: 2017-07-02 | Discharge: 2017-07-04 | DRG: 281 | Disposition: A | Discharge status: Other Acute Inpatient Hospital | Payer: Medicare Other | Attending: Internal Medicine | Admitting: Internal Medicine

## 2017-07-02 DIAGNOSIS — I25118 Atherosclerotic heart disease of native coronary artery with other forms of angina pectoris: Secondary | ICD-10-CM

## 2017-07-02 DIAGNOSIS — Z7984 Long term (current) use of oral hypoglycemic drugs: Secondary | ICD-10-CM

## 2017-07-02 DIAGNOSIS — M4802 Spinal stenosis, cervical region: Secondary | ICD-10-CM | POA: Diagnosis present

## 2017-07-02 DIAGNOSIS — R7989 Other specified abnormal findings of blood chemistry: Secondary | ICD-10-CM

## 2017-07-02 DIAGNOSIS — M542 Cervicalgia: Secondary | ICD-10-CM

## 2017-07-02 DIAGNOSIS — I214 Non-ST elevation (NSTEMI) myocardial infarction: Secondary | ICD-10-CM | POA: Diagnosis not present

## 2017-07-02 DIAGNOSIS — I10 Essential (primary) hypertension: Secondary | ICD-10-CM | POA: Diagnosis present

## 2017-07-02 DIAGNOSIS — T82855A Stenosis of coronary artery stent, initial encounter: Secondary | ICD-10-CM | POA: Diagnosis present

## 2017-07-02 DIAGNOSIS — Z79899 Other long term (current) drug therapy: Secondary | ICD-10-CM

## 2017-07-02 DIAGNOSIS — I447 Left bundle-branch block, unspecified: Secondary | ICD-10-CM | POA: Diagnosis present

## 2017-07-02 DIAGNOSIS — E039 Hypothyroidism, unspecified: Secondary | ICD-10-CM | POA: Diagnosis present

## 2017-07-02 DIAGNOSIS — R778 Other specified abnormalities of plasma proteins: Secondary | ICD-10-CM

## 2017-07-02 DIAGNOSIS — R079 Chest pain, unspecified: Secondary | ICD-10-CM | POA: Diagnosis not present

## 2017-07-02 DIAGNOSIS — I252 Old myocardial infarction: Secondary | ICD-10-CM

## 2017-07-02 DIAGNOSIS — F419 Anxiety disorder, unspecified: Secondary | ICD-10-CM | POA: Diagnosis present

## 2017-07-02 DIAGNOSIS — F17200 Nicotine dependence, unspecified, uncomplicated: Secondary | ICD-10-CM | POA: Diagnosis present

## 2017-07-02 DIAGNOSIS — Z7982 Long term (current) use of aspirin: Secondary | ICD-10-CM

## 2017-07-02 DIAGNOSIS — Y831 Surgical operation with implant of artificial internal device as the cause of abnormal reaction of the patient, or of later complication, without mention of misadventure at the time of the procedure: Secondary | ICD-10-CM | POA: Diagnosis present

## 2017-07-02 DIAGNOSIS — E1151 Type 2 diabetes mellitus with diabetic peripheral angiopathy without gangrene: Secondary | ICD-10-CM | POA: Diagnosis present

## 2017-07-02 LAB — BASIC METABOLIC PANEL
Anion gap: 12 (ref 5–15)
BUN: 16 mg/dL (ref 6–20)
CALCIUM: 9.8 mg/dL (ref 8.9–10.3)
CO2: 26 mmol/L (ref 22–32)
CREATININE: 1.11 mg/dL — AB (ref 0.44–1.00)
Chloride: 99 mmol/L — ABNORMAL LOW (ref 101–111)
GFR calc Af Amer: 55 mL/min — ABNORMAL LOW (ref >60)
GFR, EST NON AFRICAN AMERICAN: 47 mL/min — AB (ref >60)
GLUCOSE: 164 mg/dL — AB (ref 65–99)
Potassium: 4.4 mmol/L (ref 3.5–5.1)
SODIUM: 137 mmol/L (ref 135–145)

## 2017-07-02 LAB — CBC
HCT: 40.3 % (ref 35.0–47.0)
Hemoglobin: 13.8 g/dL (ref 12.0–16.0)
MCH: 30.7 pg (ref 26.0–34.0)
MCHC: 34.4 g/dL (ref 32.0–36.0)
MCV: 89.4 fL (ref 80.0–100.0)
PLATELETS: 217 10*3/uL (ref 150–440)
RBC: 4.5 MIL/uL (ref 3.80–5.20)
RDW: 13.5 % (ref 11.5–14.5)
WBC: 6.9 10*3/uL (ref 3.6–11.0)

## 2017-07-02 LAB — TROPONIN I: Troponin I: 0.04 ng/mL (ref <0.03)

## 2017-07-02 MED ORDER — ASPIRIN 81 MG PO CHEW
162.0000 mg | CHEWABLE_TABLET | Freq: Once | ORAL | Status: AC
Start: 2017-07-02 — End: 2017-07-02
  Administered 2017-07-02: 162 mg via ORAL
  Filled 2017-07-02: qty 2

## 2017-07-02 MED ORDER — HYDROCODONE-ACETAMINOPHEN 5-325 MG PO TABS: 1.0000 | ORAL_TABLET | Freq: Four times a day (QID) | ORAL | 0 refills | Status: DC | PRN

## 2017-07-02 MED ORDER — HYDROCODONE-ACETAMINOPHEN 5-325 MG PO TABS
2.0000 | ORAL_TABLET | Freq: Once | ORAL | Status: AC
  Administered 2017-07-02: 2 via ORAL
  Filled 2017-07-02: qty 2

## 2017-07-02 MED ORDER — ASPIRIN 81 MG PO CHEW
CHEWABLE_TABLET | ORAL | Status: AC
  Administered 2017-07-02: 162 mg via ORAL
  Filled 2017-07-02: qty 2

## 2017-07-02 NOTE — ED Notes (Signed)
Date and time results received: 07/02/17 9:24 PM    Test: Troponin Critical Value: 0.04  Name of Provider Notified: R[place]ark

## 2017-07-02 NOTE — ED Triage Notes (Addendum)
Pt to triage via w/c with no distress noted; pt st since last night having nausea, SHOB, left sided neck pain radiating down into left arm; denies hx of same; pt with card hx

## 2017-07-02 NOTE — ED Provider Notes (Addendum)
[facility] Emergency Department Provider Note  ____________________________________________   First MD Initiated Contact with Patient 07/02/17 2108     (approximate)  I have reviewed the triage vital signs and the nursing notes.   HISTORY  Chief Complaint Shortness of Breath   HPI Christine Obrien is a 77 y.o. female who self presents to the emergency department with roughly 2 days of intermittent moderate to severe throbbing aching left lateral neck pain radiating down her lateral left arm terminating at her thumb.  Nothing in particular seems to start the pain in the pain is been constant ever since.  She is taking meloxicam and Robaxin with minimal relief.  She denies numbness or weakness.  She does report some intermittent mild shortness of breath and intermittent chest pain.  Her symptoms are nonexertional.  No nausea or vomiting.  No diaphoresis.  She does have a past medical history of 2 myocardial infarctions although this feels different.  Past Medical History:  Diagnosis Date  . Carotid artery occlusion   . Hypertension   . Myocardial infarction [facility])     Patient Active Problem List   Diagnosis Date Noted  . 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 Diabetes ([place]) 03/25/2016  . Carotid stenosis 03/25/2016    Past Surgical History:  Procedure Laterality Date  . CHOLECYSTECTOMY      Prior to Admission medications   Medication Sig Start Date End Date Taking? Authorizing Provider  acetaminophen (TYLENOL) 325 MG tablet Tylenol   Yes [provider]  ALPRAZolam (XANAX) 0.5 MG tablet Take 0.5 mg by mouth at bedtime.  01/22/16  Yes [provider]  aspirin EC 81 MG tablet Take 81 mg by mouth.   Yes [provider]  citalopram (CELEXA) 20 MG tablet AT THE START OF THERAPY, TAKE ONE-HALF (1/2) TABLET DAILY, CAN INCREASE TO 1 TABLET DAILY THEREAFTER IF NEEDED AFTER 2 TO 3 WEEKS 11/02/15  Yes [provider]  fluticasone (FLONASE) 50 MCG/ACT nasal spray instill 2 sprays into each nostril once daily 08/16/15  Yes [provider]  LEVOXYL 112 MCG tablet Take 112 mcg by mouth daily before breakfast.  03/03/16  Yes [provider]  metFORMIN (GLUCOPHAGE) 500 MG tablet Take 500 mg by mouth daily with breakfast.    Yes [provider]  methocarbamol (ROBAXIN) 500 MG tablet Take 500 mg by mouth every morning.  03/17/17  Yes [provider]  Multiple Vitamin (MULTIVITAMIN) capsule Take 1 capsule by mouth daily.   Yes [provider]  nitroGLYCERIN (NITROSTAT) 0.4 MG SL tablet 1 tablet(s) sublingual as needed for chest pain (may repeat every 5 minutes but seek medical help if pain persists after 3 tablets) 11/08/15  Yes [provider]  omeprazole (PRILOSEC) 20 MG capsule Take 20 mg by mouth daily.  01/09/16  Yes [provider]  ondansetron (ZOFRAN) 8 MG tablet Take 8 mg by mouth. 10/24/12  Yes [provider]  sucralfate (CARAFATE) 1 g tablet Take 1 g by mouth daily.    Yes [provider]  traMADol (ULTRAM) 50 MG tablet Take 50 mg by mouth daily.  03/04/16  Yes [provider]  ferrous sulfate 325 (65 FE) MG tablet Take 325 mg by mouth.  03/09/16   [provider]    Allergies Ace inhibitors; Atorvastatin; Statins; and Sulfa antibiotics  Family History  Problem Relation Age of Onset  . Hypertension Mother   . Hypertension Father   .  Diabetes Sister   . Cancer Brother   . Diabetes Brother     Social History Social History   Tobacco Use  . Smoking status: Current Every Day Smoker  . Smokeless tobacco: Never Used  Substance Use Topics  . Alcohol use: No  . Drug use: No    Review of Systems Constitutional: No fever/chills Eyes: No visual changes. ENT: No sore throat. Cardiovascular: Positive for chest pain. Respiratory: Positive for shortness of breath. Gastrointestinal: No abdominal pain.   No nausea, no vomiting.  No diarrhea.  No constipation. Genitourinary: Negative for dysuria. Musculoskeletal: Negative for back pain. Skin: Negative for rash. Neurological: Negative for headaches, focal weakness or numbness.   ____________________________________________   PHYSICAL EXAM:  VITAL SIGNS: ED Triage Vitals [07/02/17 2046]  Enc Vitals Group     BP (!) 171/71     Pulse Rate 77     Resp 20     Temp 97.9 F (36.6 C)     Temp Source Oral     SpO2 95 %     Weight 145 lb (65.8 kg)     Height 5\' 3"  (1.6 m)     Head Circumference      Peak Flow      Pain Score 10     Pain Loc      Pain Edu?      Excl. in [place]?     Constitutional: Alert and oriented x4 pleasant cooperative somewhat uncomfortable appearing holding her left neck nontoxic Eyes: PERRL EOMI. Head: Atraumatic. Nose: No congestion/rhinnorhea. Mouth/Throat: No trismus Neck: No stridor.  Quite tender to left lateral neck paraspinal with spasm Cardiovascular: Normal rate, regular rhythm. Grossly normal heart sounds.  Good peripheral circulation. Respiratory: Normal respiratory effort.  No retractions. Lungs CTAB and moving good air Gastrointestinal: Soft nontender Musculoskeletal: No lower extremity edema   Neurologic:  Normal speech and language. No gross focal neurologic deficits are appreciated. Skin:  Skin is warm, dry and intact. No rash noted. Psychiatric: Mood and affect are normal. Speech and behavior are normal.    ____________________________________________   DIFFERENTIAL includes but not limited to  Radicular pain, vertebral artery dissection, acute coronary syndrome, pulmonary embolism, aortic dissection ____________________________________________   LABS (all labs ordered are listed, but only abnormal results are displayed)  Labs Reviewed  BASIC METABOLIC PANEL - Abnormal; Notable for the following components:      Result Value   Chloride 99 (*)    Glucose, Bld 164 (*)    Creatinine,  Ser 1.11 (*)    GFR calc non Af Amer 47 (*)    GFR calc Af Amer 55 (*)    All other components within normal limits  TROPONIN I - Abnormal; Notable for the following components:   Troponin I 0.04 (*)    All other components within normal limits  CBC  TROPONIN I    Lab work reviewed by me with some chronic kidney disease and slightly elevated troponin which is nonspecific __________________________________________  EKG  ED ECG REPORT I, Darel Hong, the attending physician, personally viewed and interpreted this ECG.  Date: 07/02/2017 EKG Time: 2050 Rate: 84 Rhythm: normal sinus rhythm QRS Axis: Leftward axis Intervals: normal ST/T Wave abnormalities: T wave inversions in 2 3 aVF are different from previous EKG in our system 03/31/2013.  Concordant ST depression V4 and concordant ST elevation in aVL different as well Narrative Interpretation: Not a clear STEMI  ____________________________________________  RADIOLOGY  Chest x-ray reviewed by me with no  acute disease CT of the neck reviewed by me with foraminal narrowing ____________________________________________   PROCEDURES  Procedure(s) performed: no  .Critical Care Performed by: Darel Hong, MD Authorized by: Darel Hong, MD   Critical care provider statement:    Critical care time (minutes):  30   Critical care time was exclusive of:  Separately billable procedures and treating other patients   Critical care was necessary to treat or prevent imminent or life-threatening deterioration of the following conditions:  Cardiac failure   Critical care was time spent personally by me on the following activities:  Development of treatment plan with patient or surrogate, discussions with consultants, evaluation of patient's response to treatment, examination of patient, obtaining history from patient or surrogate, ordering and performing treatments and interventions, ordering and review of laboratory studies,  ordering and review of radiographic studies, pulse oximetry, re-evaluation of patient's condition and review of old charts    Critical Care performed: yes  Observation: no ____________________________________________   INITIAL IMPRESSION / ASSESSMENT AND PLAN / ED COURSE  Pertinent labs & imaging results that were available during my care of the patient were reviewed by me and considered in my medical decision making (see chart for details).  The patient arrives with what sounds like radicular neck pain although her EKG is somewhat concerning for [place] criteria.  I have a call out to Cardiologist now.    ----------------------------------------- 9:47 PM on 07/02/2017 -----------------------------------------  I discussed the case with on-call cardiologist Dr. Nehemiah Massed who personally reviewed the patient's EKGs.  As the patient does not have any anginal symptoms at this time and she does not have ST changes in contiguous leads he feels that her EKG changes are likely not significant.  I do believe the patient does require a second troponin.  Her pain is adequately controlled.  Pending second troponin.  ____________________________________________ ----------------------------------------- 12:34 AM on 07/03/2017 -----------------------------------------  Second troponin markedly elevated.  The patient's symptoms are not entirely typical, however she has significant disease and a rapidly rising troponin with what appeared to be new EKG changes.  At this point the patient requires inpatient admission for telemetry and further trending of troponins and cardiology consultation in the morning.  I discussed with the patient and family who verbalized understanding and agreement the plan.  I then discussed with the hospitalist who has graciously agreed to admit the patient to his service.  FINAL CLINICAL IMPRESSION(S) / ED DIAGNOSES  Final diagnoses:  Neck pain  Radicular pain in left arm       NEW MEDICATIONS STARTED DURING THIS VISIT:  New Prescriptions   No medications on file     Note:  This document was prepared using Dragon voice recognition software and may include unintentional dictation errors.     Darel Hong, MD 07/02/17 3710    Darel Hong, MD 07/03/17 [phone]

## 2017-07-02 NOTE — ED Notes (Signed)
Patient transported to X-ray 

## 2017-07-02 NOTE — Discharge Instructions (Signed)
Please take your pain medication as needed for severe symptoms and follow-up with your primary care physician this coming Monday for reevaluation.  Return to the emergency department sooner for any concerns whatsoever such as fevers, chills, chest pain, shortness of breath, or for any other issues whatsoever.  It was a pleasure to take care of you today, and thank you for coming to our emergency department.  If you have any questions or concerns before leaving please ask the nurse to grab me and I'm more than happy to go through your aftercare instructions again.  If you were prescribed any opioid pain medication today such as Norco, Vicodin, Percocet, morphine, hydrocodone, or oxycodone please make sure you do not drive when you are taking this medication as it can alter your ability to drive safely.  If you have any concerns once you are home that you are not improving or are in fact getting worse before you can make it to your follow-up appointment, please do not hesitate to call 911 and come back for further evaluation.  Darel Hong, MD  Results for orders placed or performed during the hospital encounter of 32/35/57  Basic metabolic panel  Result Value Ref Range   Sodium 137 135 - 145 mmol/L   Potassium 4.4 3.5 - 5.1 mmol/L   Chloride 99 (L) 101 - 111 mmol/L   CO2 26 22 - 32 mmol/L   Glucose, Bld 164 (H) 65 - 99 mg/dL   BUN 16 6 - 20 mg/dL   Creatinine, Ser 1.11 (H) 0.44 - 1.00 mg/dL   Calcium 9.8 8.9 - 10.3 mg/dL   GFR calc non Af Amer 47 (L) >60 mL/min   GFR calc Af Amer 55 (L) >60 mL/min   Anion gap 12 5 - 15  CBC  Result Value Ref Range   WBC 6.9 3.6 - 11.0 K/uL   RBC 4.50 3.80 - 5.20 MIL/uL   Hemoglobin 13.8 12.0 - 16.0 g/dL   HCT 40.3 35.0 - 47.0 %   MCV 89.4 80.0 - 100.0 fL   MCH 30.7 26.0 - 34.0 pg   MCHC 34.4 32.0 - 36.0 g/dL   RDW 13.5 11.5 - 14.5 %   Platelets 217 150 - 440 K/uL  Troponin I  Result Value Ref Range   Troponin I 0.04 (HH) <0.03 ng/mL   Dg Chest  2 View  Result Date: 07/02/2017 CLINICAL DATA:  Nausea and dizziness since last evening. EXAM: CHEST - 2 VIEW COMPARISON:  03/30/2013 FINDINGS: Heart size is normal. There is moderate aortic atherosclerosis at the arch without aneurysm. Coarsened interstitial lung markings consistent with chronic smoking related bronchitic change. No alveolar consolidation, effusion or pneumothorax. Biapical pleuroparenchymal scarring and thickening is noted. No acute osseous abnormality. IMPRESSION: Coarsened interstitial lung markings consistent with chronic bronchitic change. Aortic atherosclerosis. Electronically Signed   By: Ashley Royalty M.D.   On: 07/02/2017 21:24   Ct Cervical Spine Wo Contrast  Result Date: 07/02/2017 CLINICAL DATA:  Left-sided neck pain radiating to the left upper extremity EXAM: CT CERVICAL SPINE WITHOUT CONTRAST TECHNIQUE: Multidetector CT imaging of the cervical spine was performed without intravenous contrast. Multiplanar CT image reconstructions were also generated. COMPARISON:  None. FINDINGS: Alignment: Grade 1 anterolisthesis at C5-C6 due to facet hypertrophy. Otherwise normal alignment. Skull base and vertebrae: No acute fracture. Soft tissues and spinal canal: No prevertebral fluid or swelling. No visible canal hematoma. Disc levels: No spinal canal stenosis. There is moderate-to-severe left facet hypertrophy from C2-C3 2 C5-C6.  This results in moderate foraminal stenosis at C4-5 and C5-6. There is also mild-to-moderate right neural foraminal stenosis at C6-7 due to uncovertebral hypertrophy. Upper chest: Nodular opacity of the right lung apex is favored to be atelectasis or fibrosis. Other: Normal visualized paraspinal cervical soft tissues. IMPRESSION: 1. No acute fracture of the cervical spine. 2. 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 Electronically Signed   By: Ulyses Jarred M.D.   On: 07/02/2017 22:21

## 2017-07-03 ENCOUNTER — Encounter: Payer: Self-pay | Admitting: *Deleted

## 2017-07-03 ENCOUNTER — Encounter
Admission: EM | Disposition: A | Discharge status: Other Acute Inpatient Hospital | Payer: Self-pay | Source: Home / Self Care | Attending: Internal Medicine

## 2017-07-03 ENCOUNTER — Other Ambulatory Visit: Payer: Self-pay

## 2017-07-03 DIAGNOSIS — Y831 Surgical operation with implant of artificial internal device as the cause of abnormal reaction of the patient, or of later complication, without mention of misadventure at the time of the procedure: Secondary | ICD-10-CM | POA: Diagnosis present

## 2017-07-03 DIAGNOSIS — R079 Chest pain, unspecified: Secondary | ICD-10-CM | POA: Diagnosis present

## 2017-07-03 DIAGNOSIS — F17200 Nicotine dependence, unspecified, uncomplicated: Secondary | ICD-10-CM | POA: Diagnosis present

## 2017-07-03 DIAGNOSIS — F419 Anxiety disorder, unspecified: Secondary | ICD-10-CM | POA: Diagnosis present

## 2017-07-03 DIAGNOSIS — I447 Left bundle-branch block, unspecified: Secondary | ICD-10-CM | POA: Diagnosis present

## 2017-07-03 DIAGNOSIS — I214 Non-ST elevation (NSTEMI) myocardial infarction: Secondary | ICD-10-CM | POA: Diagnosis present

## 2017-07-03 DIAGNOSIS — I252 Old myocardial infarction: Secondary | ICD-10-CM | POA: Diagnosis not present

## 2017-07-03 DIAGNOSIS — I10 Essential (primary) hypertension: Secondary | ICD-10-CM | POA: Diagnosis present

## 2017-07-03 DIAGNOSIS — Z79899 Other long term (current) drug therapy: Secondary | ICD-10-CM | POA: Diagnosis not present

## 2017-07-03 DIAGNOSIS — M4802 Spinal stenosis, cervical region: Secondary | ICD-10-CM | POA: Diagnosis present

## 2017-07-03 DIAGNOSIS — T82855A Stenosis of coronary artery stent, initial encounter: Secondary | ICD-10-CM | POA: Diagnosis present

## 2017-07-03 DIAGNOSIS — I25118 Atherosclerotic heart disease of native coronary artery with other forms of angina pectoris: Secondary | ICD-10-CM | POA: Diagnosis present

## 2017-07-03 DIAGNOSIS — E1151 Type 2 diabetes mellitus with diabetic peripheral angiopathy without gangrene: Secondary | ICD-10-CM | POA: Diagnosis present

## 2017-07-03 DIAGNOSIS — Z7982 Long term (current) use of aspirin: Secondary | ICD-10-CM | POA: Diagnosis not present

## 2017-07-03 DIAGNOSIS — E039 Hypothyroidism, unspecified: Secondary | ICD-10-CM | POA: Diagnosis present

## 2017-07-03 DIAGNOSIS — Z7984 Long term (current) use of oral hypoglycemic drugs: Secondary | ICD-10-CM | POA: Diagnosis not present

## 2017-07-03 HISTORY — PX: LEFT HEART CATH AND CORONARY ANGIOGRAPHY: CATH118249

## 2017-07-03 LAB — CBC
HEMATOCRIT: 36.3 % (ref 35.0–47.0)
HEMOGLOBIN: 12.2 g/dL (ref 12.0–16.0)
MCH: 30.3 pg (ref 26.0–34.0)
MCHC: 33.5 g/dL (ref 32.0–36.0)
MCV: 90.6 fL (ref 80.0–100.0)
Platelets: 181 10*3/uL (ref 150–440)
RBC: 4.01 MIL/uL (ref 3.80–5.20)
RDW: 13.4 % (ref 11.5–14.5)
WBC: 6.2 10*3/uL (ref 3.6–11.0)

## 2017-07-03 LAB — LIPID PANEL
CHOL/HDL RATIO: 5.4 ratio
Cholesterol: 223 mg/dL — ABNORMAL HIGH (ref 0–200)
HDL: 41 mg/dL (ref >40)
LDL CALC: 150 mg/dL — AB (ref 0–99)
TRIGLYCERIDES: 160 mg/dL — AB (ref <150)
VLDL: 32 mg/dL (ref 0–40)

## 2017-07-03 LAB — GLUCOSE, CAPILLARY
GLUCOSE-CAPILLARY: 119 mg/dL — AB (ref 65–99)
GLUCOSE-CAPILLARY: 94 mg/dL (ref 65–99)
Glucose-Capillary: 102 mg/dL — ABNORMAL HIGH (ref 65–99)

## 2017-07-03 LAB — CREATININE, SERUM
Creatinine, Ser: 1 mg/dL (ref 0.44–1.00)
GFR calc non Af Amer: 54 mL/min — ABNORMAL LOW (ref >60)

## 2017-07-03 LAB — TROPONIN I
TROPONIN I: 0.24 ng/mL — AB (ref <0.03)
Troponin I: 0.1 ng/mL (ref <0.03)
Troponin I: 0.17 ng/mL (ref <0.03)
Troponin I: 0.22 ng/mL (ref <0.03)

## 2017-07-03 LAB — TSH: TSH: 0.61 u[IU]/mL (ref 0.350–4.500)

## 2017-07-03 LAB — PROTIME-INR
INR: 1.07
PROTHROMBIN TIME: 13.8 s (ref 11.4–15.2)

## 2017-07-03 LAB — APTT

## 2017-07-03 SURGERY — LEFT HEART CATH AND CORONARY ANGIOGRAPHY
Anesthesia: Moderate Sedation

## 2017-07-03 MED ORDER — SODIUM CHLORIDE 0.9% FLUSH: 3.0000 mL | INTRAVENOUS | Status: DC | PRN

## 2017-07-03 MED ORDER — MORPHINE SULFATE (PF) 2 MG/ML IV SOLN
2.0000 mg | INTRAVENOUS | Status: DC | PRN
  Administered 2017-07-03 – 2017-07-04 (×5): 2 mg via INTRAVENOUS
  Filled 2017-07-03 (×5): qty 1

## 2017-07-03 MED ORDER — MULTIVITAMINS PO CAPS: 1.0000 | ORAL_CAPSULE | Freq: Every day | ORAL | Status: DC

## 2017-07-03 MED ORDER — ENOXAPARIN SODIUM 40 MG/0.4ML ~~LOC~~ SOLN: 40.0000 mg | SUBCUTANEOUS | Status: DC

## 2017-07-03 MED ORDER — SODIUM CHLORIDE 0.9 % IV SOLN
250.0000 mL | INTRAVENOUS | Status: DC | PRN
Start: 2017-07-03 — End: 2017-07-03

## 2017-07-03 MED ORDER — SODIUM CHLORIDE 0.9 % WEIGHT BASED INFUSION
1.0000 mL/kg/h | INTRAVENOUS | Status: AC
  Administered 2017-07-03: 1 mL/kg/h via INTRAVENOUS

## 2017-07-03 MED ORDER — SODIUM CHLORIDE 0.9 % WEIGHT BASED INFUSION: 1.0000 mL/kg/h | INTRAVENOUS | Status: DC

## 2017-07-03 MED ORDER — ALPRAZOLAM 0.5 MG PO TABS
0.5000 mg | ORAL_TABLET | Freq: Every day | ORAL | Status: DC
  Administered 2017-07-03 (×2): 0.5 mg via ORAL
  Filled 2017-07-03 (×2): qty 1

## 2017-07-03 MED ORDER — METOPROLOL TARTRATE 25 MG PO TABS
25.0000 mg | ORAL_TABLET | Freq: Two times a day (BID) | ORAL | Status: DC
  Administered 2017-07-03 – 2017-07-04 (×4): 25 mg via ORAL
  Filled 2017-07-03 (×4): qty 1

## 2017-07-03 MED ORDER — ONDANSETRON HCL 4 MG/2ML IJ SOLN
4.0000 mg | Freq: Four times a day (QID) | INTRAMUSCULAR | Status: DC | PRN
  Filled 2017-07-03 (×2): qty 2

## 2017-07-03 MED ORDER — ASPIRIN 81 MG PO CHEW: 81.0000 mg | CHEWABLE_TABLET | ORAL | Status: DC

## 2017-07-03 MED ORDER — FENTANYL CITRATE (PF) 100 MCG/2ML IJ SOLN
INTRAMUSCULAR | Status: AC
  Filled 2017-07-03: qty 2

## 2017-07-03 MED ORDER — ACETAMINOPHEN 325 MG PO TABS
650.0000 mg | ORAL_TABLET | ORAL | Status: DC | PRN
  Administered 2017-07-03: 650 mg via ORAL
  Filled 2017-07-03: qty 2

## 2017-07-03 MED ORDER — ZOLPIDEM TARTRATE 5 MG PO TABS: 5.0000 mg | ORAL_TABLET | Freq: Every evening | ORAL | Status: DC | PRN

## 2017-07-03 MED ORDER — HEPARIN (PORCINE) IN NACL 100-0.45 UNIT/ML-% IJ SOLN
800.0000 [IU]/h | INTRAMUSCULAR | Status: DC
  Administered 2017-07-03: 800 [IU]/h via INTRAVENOUS
  Filled 2017-07-03: qty 250

## 2017-07-03 MED ORDER — HEPARIN BOLUS VIA INFUSION
4000.0000 [IU] | Freq: Once | INTRAVENOUS | Status: AC
  Administered 2017-07-03: 4000 [IU] via INTRAVENOUS
  Filled 2017-07-03: qty 4000

## 2017-07-03 MED ORDER — HEPARIN (PORCINE) IN NACL 2-0.9 UNIT/ML-% IJ SOLN
INTRAMUSCULAR | Status: AC
  Filled 2017-07-03: qty 1000

## 2017-07-03 MED ORDER — SUCRALFATE 1 G PO TABS
1.0000 g | ORAL_TABLET | Freq: Every day | ORAL | Status: DC
  Administered 2017-07-03 – 2017-07-04 (×2): 1 g via ORAL
  Filled 2017-07-03 (×2): qty 1

## 2017-07-03 MED ORDER — TRAMADOL HCL 50 MG PO TABS
50.0000 mg | ORAL_TABLET | Freq: Every day | ORAL | Status: DC
  Administered 2017-07-03 – 2017-07-04 (×2): 50 mg via ORAL
  Filled 2017-07-03 (×2): qty 1

## 2017-07-03 MED ORDER — ACETAMINOPHEN 325 MG PO TABS: 650.0000 mg | ORAL_TABLET | ORAL | Status: DC | PRN

## 2017-07-03 MED ORDER — ONDANSETRON HCL 4 MG/2ML IJ SOLN
4.0000 mg | Freq: Four times a day (QID) | INTRAMUSCULAR | Status: DC | PRN
  Administered 2017-07-04 (×2): 4 mg via INTRAVENOUS

## 2017-07-03 MED ORDER — FENTANYL CITRATE (PF) 100 MCG/2ML IJ SOLN
INTRAMUSCULAR | Status: DC | PRN
  Administered 2017-07-03: 25 ug via INTRAVENOUS

## 2017-07-03 MED ORDER — METHOCARBAMOL 500 MG PO TABS
500.0000 mg | ORAL_TABLET | ORAL | Status: DC
  Administered 2017-07-03 – 2017-07-04 (×2): 500 mg via ORAL
  Filled 2017-07-03 (×3): qty 1

## 2017-07-03 MED ORDER — METFORMIN HCL 500 MG PO TABS: 500.0000 mg | ORAL_TABLET | Freq: Every day | ORAL | Status: DC

## 2017-07-03 MED ORDER — MIDAZOLAM HCL 2 MG/2ML IJ SOLN
INTRAMUSCULAR | Status: DC | PRN
  Administered 2017-07-03: 0.5 mg via INTRAVENOUS

## 2017-07-03 MED ORDER — ASPIRIN EC 325 MG PO TBEC
325.0000 mg | DELAYED_RELEASE_TABLET | Freq: Every day | ORAL | Status: DC
  Administered 2017-07-03 – 2017-07-04 (×2): 325 mg via ORAL
  Filled 2017-07-03 (×2): qty 1

## 2017-07-03 MED ORDER — GI COCKTAIL ~~LOC~~
30.0000 mL | Freq: Four times a day (QID) | ORAL | Status: DC | PRN
  Filled 2017-07-03: qty 30

## 2017-07-03 MED ORDER — INSULIN ASPART 100 UNIT/ML ~~LOC~~ SOLN
0.0000 [IU] | Freq: Three times a day (TID) | SUBCUTANEOUS | Status: DC
  Administered 2017-07-04 (×2): 1 [IU] via SUBCUTANEOUS
  Filled 2017-07-03 (×2): qty 1

## 2017-07-03 MED ORDER — NITROGLYCERIN 0.4 MG SL SUBL: 0.4000 mg | SUBLINGUAL_TABLET | SUBLINGUAL | Status: DC | PRN

## 2017-07-03 MED ORDER — HEPARIN (PORCINE) IN NACL 100-0.45 UNIT/ML-% IJ SOLN
900.0000 [IU]/h | INTRAMUSCULAR | Status: DC
  Administered 2017-07-03: 800 [IU]/h via INTRAVENOUS
  Administered 2017-07-04: 900 [IU]/h via INTRAVENOUS
  Filled 2017-07-03: qty 250

## 2017-07-03 MED ORDER — ADULT MULTIVITAMIN W/MINERALS CH
1.0000 | ORAL_TABLET | Freq: Every day | ORAL | Status: DC
  Administered 2017-07-03 – 2017-07-04 (×2): 1 via ORAL
  Filled 2017-07-03 (×2): qty 1

## 2017-07-03 MED ORDER — SODIUM CHLORIDE 0.9% FLUSH: 3.0000 mL | Freq: Two times a day (BID) | INTRAVENOUS | Status: DC

## 2017-07-03 MED ORDER — SODIUM CHLORIDE 0.9 % WEIGHT BASED INFUSION
3.0000 mL/kg/h | INTRAVENOUS | Status: DC
  Administered 2017-07-03: 3 mL/kg/h via INTRAVENOUS

## 2017-07-03 MED ORDER — LEVOTHYROXINE SODIUM 112 MCG PO TABS
112.0000 ug | ORAL_TABLET | Freq: Every day | ORAL | Status: DC
  Administered 2017-07-03 – 2017-07-04 (×2): 112 ug via ORAL
  Filled 2017-07-03 (×2): qty 1

## 2017-07-03 MED ORDER — PANTOPRAZOLE SODIUM 40 MG PO TBEC
40.0000 mg | DELAYED_RELEASE_TABLET | Freq: Every day | ORAL | Status: DC
  Administered 2017-07-03 – 2017-07-04 (×2): 40 mg via ORAL
  Filled 2017-07-03 (×2): qty 1

## 2017-07-03 MED ORDER — IOPAMIDOL (ISOVUE-300) INJECTION 61%
INTRAVENOUS | Status: DC | PRN
  Administered 2017-07-03: 110 mL via INTRA_ARTERIAL

## 2017-07-03 MED ORDER — MIDAZOLAM HCL 2 MG/2ML IJ SOLN
INTRAMUSCULAR | Status: AC
  Filled 2017-07-03: qty 2

## 2017-07-03 SURGICAL SUPPLY — 10 items
CATH INFINITI 5FR ANG PIGTAIL (CATHETERS) ×3 IMPLANT
CATH INFINITI 5FR JL4 (CATHETERS) ×3 IMPLANT
CATH INFINITI JR4 5F (CATHETERS) ×3 IMPLANT
DEVICE CLOSURE MYNXGRIP 5F (Vascular Products) ×3 IMPLANT
KIT MANI 3VAL PERCEP (MISCELLANEOUS) ×3 IMPLANT
NEEDLE PERC 18GX7CM (NEEDLE) ×3 IMPLANT
PACK CARDIAC CATH (CUSTOM PROCEDURE TRAY) ×3 IMPLANT
SHEATH PINNACLE 5F 10CM (SHEATH) ×3 IMPLANT
WIRE GUIDERIGHT .035X150 (WIRE) ×3 IMPLANT
WIRE HITORQ VERSACORE ST 145CM (WIRE) ×3 IMPLANT

## 2017-07-03 NOTE — Consult Note (Signed)
[facility] Cardiology Consultation Note  Patient ID: Christine Obrien, MRN: [id number], DOB/AGE: [birth year] 76 y.o. Admit date: 07/02/2017   Date of Consult: 07/03/2017 Primary Physician: [name], MD Primary Cardiologist: Call would  Chief Complaint:  Chief Complaint  Patient presents with  . Shortness of Breath   Reason for Consult: Neck and chest pain  HPI: 77 y.o. 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  The patient has had no evidence of primary chest pain although this may be anginal equivalent.  She does have known tobacco abuse which is continuing to be constant with appropriate metoprolol use for hypertension control but no use of statin therapy.  The patient has had new onset left arm and left upper chest and left neck discomfort with admission to the hospital with an EKG showing normal sinus rhythm with left bundle branch block.  The left bundle branch block is a continued issue from the previous.  Elevated troponin to 0.22 and trending upward consistent with non-ST elevation myocardial infarction  Past Medical History:  Diagnosis Date  . Carotid artery occlusion   . Hypertension   . Myocardial infarction [facility])       Surgical History:  Past Surgical History:  Procedure Laterality Date  . CHOLECYSTECTOMY       Home Meds: Prior to Admission medications   Medication Sig Start Date End Date Taking? Authorizing Provider  acetaminophen (TYLENOL) 325 MG tablet Tylenol   Yes [provider]  ALPRAZolam (XANAX) 0.5 MG tablet Take 0.5 mg by mouth at bedtime.   01/22/16  Yes [provider]  aspirin EC 81 MG tablet Take 81 mg by mouth.   Yes [provider]  citalopram (CELEXA) 20 MG tablet AT THE START OF THERAPY, TAKE ONE-HALF (1/2) TABLET DAILY, CAN INCREASE TO 1 TABLET DAILY THEREAFTER IF NEEDED AFTER 2 TO 3 WEEKS 11/02/15  Yes [provider]  fluticasone (FLONASE) 50 MCG/ACT nasal spray instill 2 sprays into each nostril once daily 08/16/15  Yes [provider]  LEVOXYL 112 MCG tablet Take 112 mcg by mouth daily before breakfast.  03/03/16  Yes [provider]  meloxicam (MOBIC) 15 MG tablet Take 15 mg by mouth daily.   Yes [provider]  metFORMIN (GLUCOPHAGE) 500 MG tablet Take 500 mg by mouth daily with breakfast.    Yes [provider]  methocarbamol (ROBAXIN) 500 MG tablet Take 500 mg by mouth every morning.  03/17/17  Yes [provider]  Multiple Vitamin (MULTIVITAMIN) capsule Take 1 capsule by mouth daily.   Yes [provider]  nitroGLYCERIN (NITROSTAT) 0.4 MG SL tablet 1 tablet(s) sublingual as needed for chest pain (may repeat every 5 minutes but seek medical help if pain persists after 3 tablets) 11/08/15  Yes [provider]  omeprazole (PRILOSEC) 20 MG capsule Take 20 mg by mouth daily.  01/09/16  Yes [provider]  ondansetron (ZOFRAN) 8 MG tablet Take 8 mg by mouth. 10/24/12  Yes [provider]  sucralfate (CARAFATE) 1 g tablet Take 1 g by mouth daily.    Yes [provider]  traMADol (ULTRAM) 50 MG tablet Take  50 mg by mouth daily.  03/04/16  Yes [provider]  ferrous sulfate 325 (65 FE) MG tablet Take 325 mg by mouth.  03/09/16   [provider]  HYDROcodone-acetaminophen (NORCO) 5-325 MG tablet Take 1 tablet by mouth every 6 (six) hours as needed for up to 7 doses for severe pain. 07/02/17   Darel Hong, MD    Inpatient Medications:  . ALPRAZolam  0.5 mg Oral QHS  . aspirin EC  325 mg Oral  Daily  . levothyroxine  112 mcg Oral QAC breakfast  . metFORMIN  500 mg Oral Q breakfast  . methocarbamol  500 mg Oral BH-q7a  . metoprolol tartrate  25 mg Oral BID  . multivitamin with minerals  1 tablet Oral Daily  . pantoprazole  40 mg Oral Daily  . sucralfate  1 g Oral Daily  . traMADol  50 mg Oral Daily   . heparin 800 Units/hr (07/03/17 3532)    Allergies:  Allergies  Allergen Reactions  . Ace Inhibitors     Other reaction(s): Cough  . Atorvastatin     Other reaction(s): Muscle Pain  . Statins     Other reaction(s): Muscle Pain Muscle pain  . Sulfa Antibiotics     Other reaction(s): Unknown    Social History   Socioeconomic History  . Marital status: Widowed    Spouse name: Not on file  . Number of children: Not on file  . Years of education: Not on file  . Highest education level: Not on file  Social Needs  . Financial resource strain: Not on file  . Food insecurity - worry: Not on file  . Food insecurity - inability: Not on file  . Transportation needs - medical: Not on file  . Transportation needs - non-medical: Not on file  Occupational History  . Not on file  Tobacco Use  . Smoking status: Current Every Day Smoker  . Smokeless tobacco: Never Used  Substance and Sexual Activity  . Alcohol use: No  . Drug use: No  . Sexual activity: Not on file  Other Topics Concern  . Not on file  Social History Narrative  . Not on file     Family History  Problem Relation Age of Onset  . Hypertension Mother   . Hypertension Father   . Diabetes Sister   . Cancer Brother   . Diabetes Brother      Review of Systems Positive for chest and neck pain Negative for: General:  chills, fever, night sweats or weight changes.  Cardiovascular: PND orthopnea syncope dizziness  Dermatological skin lesions rashes Respiratory: Cough congestion Urologic: Frequent urination urination at night and hematuria Abdominal: negative for nausea, vomiting, diarrhea, bright red  blood per rectum, melena, or hematemesis Neurologic: negative for visual changes, and/or hearing changes  All other systems reviewed and are otherwise negative except as noted above.  Labs: Recent Labs    07/02/17 2049 07/02/17 2340 07/03/17 0502  TROPONINI 0.04* 0.10* 0.22*   Lab Results  Component Value Date   WBC 6.2 07/03/2017   HGB 12.2 07/03/2017   HCT 36.3 07/03/2017   MCV 90.6 07/03/2017   PLT 181 07/03/2017    Recent Labs  Lab 07/02/17 2049 07/03/17 0502  NA 137  --   K 4.4  --   CL 99*  --   CO2 26  --   BUN 16  --   CREATININE 1.11* 1.00  CALCIUM 9.8  --   GLUCOSE 164*  --  Lab Results  Component Value Date   CHOL 175 03/30/2013   HDL 41 03/30/2013   LDLCALC 102 (H) 03/30/2013   TRIG 161 03/30/2013   No results found for: DDIMER  Radiology/Studies:  Dg Chest 2 View  Result Date: 07/02/2017 CLINICAL DATA:  Nausea and dizziness since last evening. EXAM: CHEST - 2 VIEW COMPARISON:  03/30/2013 FINDINGS: Heart size is normal. There is moderate aortic atherosclerosis at the arch without aneurysm. Coarsened interstitial lung markings consistent with chronic smoking related bronchitic change. No alveolar consolidation, effusion or pneumothorax. Biapical pleuroparenchymal scarring and thickening is noted. No acute osseous abnormality. IMPRESSION: Coarsened interstitial lung markings consistent with chronic bronchitic change. Aortic atherosclerosis. Electronically Signed   By: Ashley Royalty M.D.   On: 07/02/2017 21:24   Ct Cervical Spine Wo Contrast  Result Date: 07/02/2017 CLINICAL DATA:  Left-sided neck pain radiating to the left upper extremity EXAM: CT CERVICAL SPINE WITHOUT CONTRAST TECHNIQUE: Multidetector CT imaging of the cervical spine was performed without intravenous contrast. Multiplanar CT image reconstructions were also generated. COMPARISON:  None. FINDINGS: Alignment: Grade 1 anterolisthesis at C5-C6 due to facet hypertrophy. Otherwise normal  alignment. Skull base and vertebrae: No acute fracture. Soft tissues and spinal canal: No prevertebral fluid or swelling. No visible canal hematoma. Disc levels: No spinal canal stenosis. There is moderate-to-severe left facet hypertrophy from C2-C3 2 C5-C6. This results in moderate foraminal stenosis at C4-5 and C5-6. There is also mild-to-moderate right neural foraminal stenosis at C6-7 due to uncovertebral hypertrophy. Upper chest: Nodular opacity of the right lung apex is favored to be atelectasis or fibrosis. Other: Normal visualized paraspinal cervical soft tissues. IMPRESSION: 1. No acute fracture of the cervical spine. 2. 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 Electronically Signed   By: Ulyses Jarred M.D.   On: 07/02/2017 22:21    EKG: Normal sinus rhythm with left bundle branch block  Weights: Filed Weights   07/02/17 2046 07/03/17 0138  Weight: 145 lb (65.8 kg) 145 lb 3.2 oz (65.9 kg)     Physical Exam: Blood pressure (!) 110/57, pulse (!) 53, temperature 98.1 F (36.7 C), resp. rate 18, height 5\' 3"  (1.6 m), weight 145 lb 3.2 oz (65.9 kg), SpO2 92 %. Body mass index is 25.72 kg/m. General: Well developed, well nourished, in no acute distress. Head eyes ears nose throat: Normocephalic, atraumatic, sclera non-icteric, no xanthomas, nares are without discharge. No apparent thyromegaly and/or mass  Lungs: Normal respiratory effort.  Diffuse wheezes, no rales, no rhonchi.  Heart: RRR with normal S1 S2. no murmur gallop, no rub, PMI is normal size and placement, carotid upstroke normal without bruit, jugular venous pressure is normal Abdomen: Soft, non-tender, non-distended with normoactive bowel sounds. No hepatomegaly. No rebound/guarding. No obvious abdominal masses. Abdominal aorta is normal size without bruit Extremities: No edema. no cyanosis, no clubbing, no ulcers  Peripheral : 2+ bilateral upper extremity  pulses, 2+ bilateral femoral pulses, 2+ bilateral dorsal pedal pulse Neuro: Alert and oriented. No facial asymmetry. No focal deficit. Moves all extremities spontaneously. Musculoskeletal: Normal muscle tone without kyphosis Psych:  Responds to questions appropriately with a normal affect.    Assessment: 77 year old female with acute non-ST elevation myocardial infarction with abnormal EKG of left bundle branch block and chest discomfort and previous myocardial infarction with peripheral vascular disease and tobacco abuse needing further medical management treatment options  Plan: 1.  Continue heparin and aspirin for further risk reduction of worsening myocardial  infarction 2.  Echocardiogram for LV systolic dysfunction valvular heart disease 3.  Continue metoprolol and addition of a high intensity cholesterol therapy as able 4.  Proceed to cardiac catheterization to assess coronary anatomy and further treatment thereof is necessary.  Patient understands the risk and benefits of cardiac catheterization.  This includes a possibility of death stroke heart attack infection bleeding blood clot.  She is at low risk for conscious sedation  Signed, Corey Skains M.D. [facility] Cardiology 07/03/2017, 8:42 AM

## 2017-07-03 NOTE — Progress Notes (Signed)
Dr. Manuella Ghazi cancelling Christine Obrien, consulting cards.

## 2017-07-03 NOTE — Progress Notes (Signed)
Labs drawn at [facility] posted; troponin is trending up.  MD on call text paged and given values.

## 2017-07-03 NOTE — Progress Notes (Signed)
[facility] Cardiology [facility] Encounter Note  Patient: Christine Obrien / Admit Date: 07/02/2017 / Date of Encounter: 07/03/2017, 2:13 PM   Subjective: Patient with left upper chest and neck pain but now resolved.  Heart rate controlled.  Normal elevation of troponin consistent with non-ST elevation myocardial infarction Cardiac catheterization showing significant left main coronary atherosclerosis of 75% with ostial LAD stenosis of 70% and in-stent stenosis of circumflex of 85% stenosis with patent 30% stenosis of right coronary artery stent  Review of Systems: Positive for: Rest pain shortness of breath Negative for: Vision change, hearing change, syncope, dizziness, nausea, vomiting,diarrhea, bloody stool, stomach pain, cough, congestion, diaphoresis, urinary frequency, urinary pain,skin lesions, skin rashes Others previously listed  Objective: Telemetry: Sinus rhythm with left bundle branch block Physical Exam: Blood pressure (!) 99/56, pulse (!) 52, temperature 98.1 F (36.7 C), temperature source Oral, resp. rate 12, height 5\' 3"  (1.6 m), weight 145 lb (65.8 kg), SpO2 92 %. Body mass index is 25.69 kg/m. General: Well developed, well nourished, in no acute distress. Head: Normocephalic, atraumatic, sclera non-icteric, no xanthomas, nares are without discharge. Neck: No apparent masses Lungs: Normal respirations with some wheezes, no rhonchi, no rales , no crackles   Heart: Regular rate and rhythm, normal S1 S2, no murmur, no rub, no gallop, PMI is normal size and placement, carotid upstroke normal without bruit, jugular venous pressure normal Abdomen: Soft, non-tender, non-distended with normoactive bowel sounds. No hepatosplenomegaly. Abdominal aorta is normal size without bruit Extremities: No edema, no clubbing, no cyanosis, no ulcers,  Peripheral: 2+ radial, 2+ femoral, 2+ dorsal pedal pulses Neuro: Alert and oriented. Moves all extremities spontaneously. Psych:  Responds to  questions appropriately with a normal affect.   Intake/Output Summary (Last 24 hours) at 07/03/2017 1413 Last data filed at 07/03/2017 0449 Gross per 24 hour  Intake -  Output 0 ml  Net 0 ml    Inpatient Medications:  . [MAR Hold] ALPRAZolam  0.5 mg Oral QHS  . [START ON 07/04/2017] aspirin  81 mg Oral Pre-Cath  . [MAR Hold] aspirin EC  325 mg Oral Daily  . [MAR Hold] levothyroxine  112 mcg Oral QAC breakfast  . [MAR Hold] metFORMIN  500 mg Oral Q breakfast  . [MAR Hold] methocarbamol  500 mg Oral BH-q7a  . [MAR Hold] metoprolol tartrate  25 mg Oral BID  . [MAR Hold] multivitamin with minerals  1 tablet Oral Daily  . [MAR Hold] pantoprazole  40 mg Oral Daily  . sodium chloride flush  3 mL Intravenous Q12H  . [MAR Hold] sucralfate  1 g Oral Daily  . [MAR Hold] traMADol  50 mg Oral Daily   Infusions:  . sodium chloride    . [START ON 07/04/2017] sodium chloride 3 mL/kg/hr (07/03/17 1236)   Followed by  . [START ON 07/04/2017] sodium chloride    . heparin 800 Units/hr (07/03/17 0652)    Labs: Recent Labs    07/02/17 2049 07/03/17 0502  NA 137  --   K 4.4  --   CL 99*  --   CO2 26  --   GLUCOSE 164*  --   BUN 16  --   CREATININE 1.11* 1.00  CALCIUM 9.8  --    No results for input(s): AST, ALT, ALKPHOS, BILITOT, PROT, ALBUMIN in the last 72 hours. Recent Labs    07/02/17 2049 07/03/17 0502  WBC 6.9 6.2  HGB 13.8 12.2  HCT 40.3 36.3  MCV 89.4 90.6  PLT  [place]    07/02/17 2049 07/02/17 2340 07/03/17 0502 07/03/17 0906  TROPONINI 0.04* 0.10* 0.22* 0.24*   Invalid input(s): POCBNP No results for input(s): HGBA1C in the last 72 hours.   Weights: Filed Weights   07/02/17 2046 07/03/17 0138 07/03/17 1241  Weight: 145 lb (65.8 kg) 145 lb 3.2 oz (65.9 kg) 145 lb (65.8 kg)     Radiology/Studies:  Dg Chest 2 View  Result Date: 07/02/2017 CLINICAL DATA:  Nausea and dizziness since last evening. EXAM: CHEST - 2 VIEW COMPARISON:  03/30/2013  FINDINGS: Heart size is normal. There is moderate aortic atherosclerosis at the arch without aneurysm. Coarsened interstitial lung markings consistent with chronic smoking related bronchitic change. No alveolar consolidation, effusion or pneumothorax. Biapical pleuroparenchymal scarring and thickening is noted. No acute osseous abnormality. IMPRESSION: Coarsened interstitial lung markings consistent with chronic bronchitic change. Aortic atherosclerosis. Electronically Signed   By: Ashley Royalty M.D.   On: 07/02/2017 21:24   Ct Cervical Spine Wo Contrast  Result Date: 07/02/2017 CLINICAL DATA:  Left-sided neck pain radiating to the left upper extremity EXAM: CT CERVICAL SPINE WITHOUT CONTRAST TECHNIQUE: Multidetector CT imaging of the cervical spine was performed without intravenous contrast. Multiplanar CT image reconstructions were also generated. COMPARISON:  None. FINDINGS: Alignment: Grade 1 anterolisthesis at C5-C6 due to facet hypertrophy. Otherwise normal alignment. Skull base and vertebrae: No acute fracture. Soft tissues and spinal canal: No prevertebral fluid or swelling. No visible canal hematoma. Disc levels: No spinal canal stenosis. There is moderate-to-severe left facet hypertrophy from C2-C3 2 C5-C6. This results in moderate foraminal stenosis at C4-5 and C5-6. There is also mild-to-moderate right neural foraminal stenosis at C6-7 due to uncovertebral hypertrophy. Upper chest: Nodular opacity of the right lung apex is favored to be atelectasis or fibrosis. Other: Normal visualized paraspinal cervical soft tissues. IMPRESSION: 1. No acute fracture of the cervical spine. 2. 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 Electronically Signed   By: Ulyses Jarred M.D.   On: 07/02/2017 22:21     Assessment and Recommendation  77 y.o. female with known coronary disease status post previous microinfarction PCI and stent placement of  right coronary artery as well as circumflex artery with history of left bundle branch block having new onset of chest discomfort left neck pain and worsening left bundle branch block with a non-ST elevation myocardial infarction Cardiac catheterization showing significant ostial left main atherosclerosis with significant ostial LAD and mid circumflex artery stenosis 1.  Continue aspirin for further risk reduction cardiovascular event 2.  Metoprolol for heart rate control and myocardial infarction 3.  ACE inhibitor as necessary for hypertension control 4.  High intensity cholesterol therapy 5.  Patient to stop smoking 6.  Further consideration of transfer for coronary artery bypass grafting due to ostial left main coronary atherosclerosis with non-ST elevation myocardial infarction  Signed, Serafina Royals M.D. FACC

## 2017-07-03 NOTE — Progress Notes (Signed)
ANTICOAGULATION CONSULT NOTE - Initial Consult  Pharmacy Consult for heparin Indication: chest pain/ACS  Allergies  Allergen Reactions  . Ace Inhibitors     Other reaction(s): Cough  . Atorvastatin     Other reaction(s): Muscle Pain  . Statins     Other reaction(s): Muscle Pain Muscle pain  . Sulfa Antibiotics     Other reaction(s): Unknown    Patient Measurements: Height: 5\' 3"  (160 cm) Weight: 145 lb (65.8 kg) IBW/kg (Calculated) : 52.4 Heparin Dosing Weight: 66 kg  Vital Signs: Temp: 98.1 F (36.7 C) (03/15 1241) Temp Source: Oral (03/15 1241) BP: 132/52 (03/15 1615) Pulse Rate: 53 (03/15 1615)  Labs: Recent Labs    07/02/17 2049 07/02/17 2340 07/03/17 0502 07/03/17 0715 07/03/17 0906  HGB 13.8  --  12.2  --   --   HCT 40.3  --  36.3  --   --   PLT 217  --  181  --   --   APTT  --   --   --  >160*  --   LABPROT  --   --   --  13.8  --   INR  --   --   --  1.07  --   CREATININE 1.11*  --  1.00  --   --   TROPONINI 0.04* 0.10* 0.22*  --  0.24*    Estimated Creatinine Clearance: 44.4 mL/min (by C-G formula based on SCr of 1 mg/dL).   Medical History: Past Medical History:  Diagnosis Date  . Carotid artery occlusion   . Hypertension   . Myocardial infarction ([place])     Medications:  Scheduled:  . ALPRAZolam  0.5 mg Oral QHS  . aspirin EC  325 mg Oral Daily  . insulin aspart  0-9 Units Subcutaneous TID WC  . levothyroxine  112 mcg Oral QAC breakfast  . methocarbamol  500 mg Oral BH-q7a  . metoprolol tartrate  25 mg Oral BID  . multivitamin with minerals  1 tablet Oral Daily  . pantoprazole  40 mg Oral Daily  . sucralfate  1 g Oral Daily  . traMADol  50 mg Oral Daily    Assessment: Patient admitted for SOB found to have rising trops 0.10 >> 0.22. Patient does not take any PTA anticoagulants. Heparin drip on hold for cath, per note from RN, Dr. Nehemiah Massed would like to start heparin back at same rate (800units/hr) at 1700 today.   Goal of Therapy:   Heparin level 0.3-0.7 units/ml Monitor platelets by anticoagulation protocol: Yes   Plan:  Will resume heparin drip 800 units/hr @ 1700 Will check a HL @ 1300 8 hours after start  Will monitor daily CBC's and adjust per HL's   Candelaria Stagers, PharmD Pharmacy Resident  07/03/2017

## 2017-07-03 NOTE — Progress Notes (Signed)
ANTICOAGULATION CONSULT NOTE - Initial Consult  Pharmacy Consult for heparin Indication: chest pain/ACS  Allergies  Allergen Reactions  . Ace Inhibitors     Other reaction(s): Cough  . Atorvastatin     Other reaction(s): Muscle Pain  . Statins     Other reaction(s): Muscle Pain Muscle pain  . Sulfa Antibiotics     Other reaction(s): Unknown    Patient Measurements: Height: 5\' 3"  (160 cm) Weight: 145 lb 3.2 oz (65.9 kg) IBW/kg (Calculated) : 52.4 Heparin Dosing Weight: 66 kg  Vital Signs: Temp: 97.8 F (36.6 C) (03/15 0446) Temp Source: Oral (03/15 0446) BP: 91/54 (03/15 0448) Pulse Rate: 52 (03/15 0448)  Labs: Recent Labs    07/02/17 2049 07/02/17 2340 07/03/17 0502  HGB 13.8  --  12.2  HCT 40.3  --  36.3  PLT 217  --  181  CREATININE 1.11*  --  1.00  TROPONINI 0.04* 0.10* 0.22*    Estimated Creatinine Clearance: 44.4 mL/min (by C-G formula based on SCr of 1 mg/dL).   Medical History: Past Medical History:  Diagnosis Date  . Carotid artery occlusion   . Hypertension   . Myocardial infarction ([place])     Medications:  Scheduled:  . ALPRAZolam  0.5 mg Oral QHS  . aspirin EC  325 mg Oral Daily  . heparin  4,000 Units Intravenous Once  . levothyroxine  112 mcg Oral QAC breakfast  . metFORMIN  500 mg Oral Q breakfast  . methocarbamol  500 mg Oral BH-q7a  . metoprolol tartrate  25 mg Oral BID  . multivitamin with minerals  1 tablet Oral Daily  . pantoprazole  40 mg Oral Daily  . sucralfate  1 g Oral Daily  . traMADol  50 mg Oral Daily    Assessment: Patient admitted for SOB found to have rising trops 0.10 >> 0.22. Patient does not take any PTA anticoagulants. Being started on heparin drip.  Goal of Therapy:  Heparin level 0.3-0.7 units/ml Monitor platelets by anticoagulation protocol: Yes   Plan:  Will bolus heparin 4000 units IV x 1 Will start heparin drip @ 800 units/hr Will check a HL @ 1300 8 hours after start  Baseline labs drawn Will  monitor daily CBC's and adjust per HL's   Tobie Lords, PharmD, BCPS Clinical Pharmacist 07/03/2017

## 2017-07-03 NOTE — Progress Notes (Signed)
troponins trending up. Will cancel myoview and c/s cardio ([place]) to decide further course   Start heparin drip

## 2017-07-03 NOTE — H&P (Signed)
[place] [facility] [place] NAME: Christine Obrien    MR#:  [id number]  DATE OF BIRTH:  [birth year]  DATE OF ADMISSION:  07/02/2017  PRIMARY CARE PHYSICIAN: [name], MD   REQUESTING/REFERRING PHYSICIAN: Darel Hong, MD  CHIEF COMPLAINT:   Chief Complaint  Patient presents with  . Shortness of Breath    HISTORY OF PRESENT ILLNESS:  Christine Obrien  is a 77 y.o. female with a known history of CAD, HTN is being admitted for chest pain r/o. C/o intermittent moderate to severe throbbing aching left lateral neck pain radiating down her lateral left arm terminating at her thumb for last 2 days.  She is taking meloxicam and Robaxin with minimal relief. She also reports some intermittent mild shortness of breath and intermittent chest pain.  Her symptoms are nonexertional.   PAST MEDICAL HISTORY:   Past Medical History:  Diagnosis Date  . Carotid artery occlusion   . Hypertension   . Myocardial infarction [facility])     PAST SURGICAL HISTORY:   Past Surgical History:  Procedure Laterality Date  . CHOLECYSTECTOMY      SOCIAL HISTORY:   Social History   Tobacco Use  . Smoking status: Current Every Day Smoker  . Smokeless tobacco: Never Used  Substance Use Topics  . Alcohol use: No    FAMILY HISTORY:   Family History  Problem Relation Age of Onset  . Hypertension Mother   . Hypertension Father   . Diabetes Sister   . Cancer Brother   . Diabetes Brother     DRUG ALLERGIES:   Allergies  Allergen Reactions  . Ace Inhibitors     Other reaction(s): Cough  . Atorvastatin     Other reaction(s): Muscle Pain  . Statins     Other reaction(s): Muscle Pain Muscle pain  . Sulfa Antibiotics     Other reaction(s): Unknown    REVIEW OF SYSTEMS:   Review of Systems  Constitutional: Negative for chills, fever and weight loss.  HENT: Negative for nosebleeds and sore throat.   Eyes: Negative for blurred vision.  Respiratory: Negative  for cough, shortness of breath and wheezing.   Cardiovascular: Positive for chest pain. Negative for orthopnea, leg swelling and PND.  Gastrointestinal: Negative for abdominal pain, constipation, diarrhea, heartburn, nausea and vomiting.  Genitourinary: Negative for dysuria and urgency.  Musculoskeletal: Negative for back pain.  Skin: Negative for rash.  Neurological: Negative for dizziness, speech change, focal weakness and headaches.  Endo/Heme/Allergies: Does not bruise/bleed easily.  Psychiatric/Behavioral: Negative for depression.   MEDICATIONS AT HOME:   Prior to Admission medications   Medication Sig Start Date End Date Taking? Authorizing Provider  acetaminophen (TYLENOL) 325 MG tablet Tylenol   Yes [provider]  ALPRAZolam (XANAX) 0.5 MG tablet Take 0.5 mg by mouth at bedtime.  01/22/16  Yes [provider]  aspirin EC 81 MG tablet Take 81 mg by mouth.   Yes [provider]  citalopram (CELEXA) 20 MG tablet AT THE START OF THERAPY, TAKE ONE-HALF (1/2) TABLET DAILY, CAN INCREASE TO 1 TABLET DAILY THEREAFTER IF NEEDED AFTER 2 TO 3 WEEKS 11/02/15  Yes [provider]  fluticasone (FLONASE) 50 MCG/ACT nasal spray instill 2 sprays into each nostril once daily 08/16/15  Yes [provider]  LEVOXYL 112 MCG tablet Take 112 mcg by mouth daily before breakfast.  03/03/16  Yes [provider]  metFORMIN (GLUCOPHAGE) 500 MG tablet Take 500 mg by mouth  daily with breakfast.    Yes [provider]  methocarbamol (ROBAXIN) 500 MG tablet Take 500 mg by mouth every morning.  03/17/17  Yes [provider]  Multiple Vitamin (MULTIVITAMIN) capsule Take 1 capsule by mouth daily.   Yes [provider]  nitroGLYCERIN (NITROSTAT) 0.4 MG SL tablet 1 tablet(s) sublingual as needed for chest pain (may repeat every 5 minutes but seek medical help if pain persists after 3 tablets) 11/08/15  Yes [provider]  omeprazole  (PRILOSEC) 20 MG capsule Take 20 mg by mouth daily.  01/09/16  Yes [provider]  ondansetron (ZOFRAN) 8 MG tablet Take 8 mg by mouth. 10/24/12  Yes [provider]  sucralfate (CARAFATE) 1 g tablet Take 1 g by mouth daily.    Yes [provider]  traMADol (ULTRAM) 50 MG tablet Take 50 mg by mouth daily.  03/04/16  Yes [provider]  ferrous sulfate 325 (65 FE) MG tablet Take 325 mg by mouth.  03/09/16   [provider]  HYDROcodone-acetaminophen (NORCO) 5-325 MG tablet Take 1 tablet by mouth every 6 (six) hours as needed for up to 7 doses for severe pain. 07/02/17   Darel Hong, MD      VITAL SIGNS:  Blood pressure (!) 148/74, pulse 77, temperature 97.9 F (36.6 C), temperature source Oral, resp. rate 18, height 5\' 3"  (1.6 m), weight 65.8 kg (145 lb), SpO2 95 %.  PHYSICAL EXAMINATION:  Physical Exam  GENERAL:  77 y.o.-year-old patient lying in the bed with no acute distress.  EYES: Pupils equal, round, reactive to light and accommodation. No scleral icterus. Extraocular muscles intact.  HEENT: Head atraumatic, normocephalic. Oropharynx and nasopharynx clear.  NECK:  Supple, no jugular venous distention. No thyroid enlargement, no tenderness.  LUNGS: Normal breath sounds bilaterally, no wheezing, rales,rhonchi or crepitation. No use of accessory muscles of respiration.  CARDIOVASCULAR: S1, S2 normal. No murmurs, rubs, or gallops.  ABDOMEN: Soft, nontender, nondistended. Bowel sounds present. No organomegaly or mass.  EXTREMITIES: No pedal edema, cyanosis, or clubbing.  NEUROLOGIC: Cranial nerves II through XII are intact. Muscle strength 5/5 in all extremities. Sensation intact. Gait not checked.  PSYCHIATRIC: The patient is alert and oriented x 3.  SKIN: No obvious rash, lesion, or ulcer.   LABORATORY PANEL:   CBC Recent Labs  Lab 07/02/17 2049  WBC 6.9  HGB 13.8  HCT 40.3  PLT 217    ------------------------------------------------------------------------------------------------------------------  Chemistries  Recent Labs  Lab 07/02/17 2049  NA 137  K 4.4  CL 99*  CO2 26  GLUCOSE 164*  BUN 16  CREATININE 1.11*  CALCIUM 9.8   ------------------------------------------------------------------------------------------------------------------  Cardiac Enzymes Recent Labs  Lab 07/02/17 2340  TROPONINI 0.10*   ------------------------------------------------------------------------------------------------------------------  RADIOLOGY:  Dg Chest 2 View  Result Date: 07/02/2017 CLINICAL DATA:  Nausea and dizziness since last evening. EXAM: CHEST - 2 VIEW COMPARISON:  03/30/2013 FINDINGS: Heart size is normal. There is moderate aortic atherosclerosis at the arch without aneurysm. Coarsened interstitial lung markings consistent with chronic smoking related bronchitic change. No alveolar consolidation, effusion or pneumothorax. Biapical pleuroparenchymal scarring and thickening is noted. No acute osseous abnormality. IMPRESSION: Coarsened interstitial lung markings consistent with chronic bronchitic change. Aortic atherosclerosis. Electronically Signed   By: Ashley Royalty M.D.   On: 07/02/2017 21:24   Ct Cervical Spine Wo Contrast  Result Date: 07/02/2017 CLINICAL DATA:  Left-sided neck pain radiating to the left upper extremity EXAM: CT CERVICAL SPINE WITHOUT CONTRAST TECHNIQUE: Multidetector CT  imaging of the cervical spine was performed without intravenous contrast. Multiplanar CT image reconstructions were also generated. COMPARISON:  None. FINDINGS: Alignment: Grade 1 anterolisthesis at C5-C6 due to facet hypertrophy. Otherwise normal alignment. Skull base and vertebrae: No acute fracture. Soft tissues and spinal canal: No prevertebral fluid or swelling. No visible canal hematoma. Disc levels: No spinal canal stenosis. There is moderate-to-severe left facet hypertrophy  from C2-C3 2 C5-C6. This results in moderate foraminal stenosis at C4-5 and C5-6. There is also mild-to-moderate right neural foraminal stenosis at C6-7 due to uncovertebral hypertrophy. Upper chest: Nodular opacity of the right lung apex is favored to be atelectasis or fibrosis. Other: Normal visualized paraspinal cervical soft tissues. IMPRESSION: 1. No acute fracture of the cervical spine. 2. 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 Electronically Signed   By: Ulyses Jarred M.D.   On: 07/02/2017 22:21   IMPRESSION AND PLAN:  52 y f with chest pain  * Chest pain: serial troponins - asa, nitro - tele - myoview in am  * elevated troponins - trending up, will do serial troponins - if rules in, may need cath instead of myoview  * Hypothyroidism - continue synthroid, check TSH  * Anxiety - continue xanax    All the records are reviewed and case discussed with ED provider. Management plans discussed with the patient, family (daughter at bedside) and they are in agreement.  CODE STATUS: FULL CODE  TOTAL TIME TAKING CARE OF THIS PATIENT: 45 minutes.    Max Sane M.D on 07/03/2017 at 12:43 AM  Between 7am to 6pm - Pager - [phone]  After 6pm go to [email or web address] - Proofreader  [facility]  Office  [phone]  CC: Primary care physician; [name], MD   Note: This dictation was prepared with Dragon dictation along with smaller phrase technology. Any transcriptional errors that result from this process are unintentional.

## 2017-07-03 NOTE — Progress Notes (Signed)
[place] at [facility]                                                                                                                                                                                  Patient Demographics   Christine Obrien, is a 77 y.o. female, DOB - November 15, 1940, ZOX:[id number]  Admit date - 07/02/2017   Admitting Physician Max Sane, MD  Outpatient Primary MD for the patient is [name], MD   LOS - 0  Subjective: Patient admitted with neck pain continues to have neck pain Underwent cardiac cath significant ostial left main atherosclerosis with significant ostial LAD and mid circumflex artery stenosis      Review of Systems:   CONSTITUTIONAL: No documented fever. No fatigue, weakness. No weight gain, no weight loss.  EYES: No blurry or double vision.  ENT: No tinnitus. No postnasal drip. No redness of the oropharynx.  RESPIRATORY: No cough, no wheeze, no hemoptysis. No dyspnea.  CARDIOVASCULAR: No chest pain. No orthopnea. No palpitations. No syncope.  GASTROINTESTINAL: No nausea, no vomiting or diarrhea. No abdominal pain. No melena or hematochezia.  GENITOURINARY: No dysuria or hematuria.  ENDOCRINE: No polyuria or nocturia. No heat or cold intolerance.  HEMATOLOGY: No anemia. No bruising. No bleeding.  INTEGUMENTARY: No rashes. No lesions.  MUSCULOSKELETAL: No arthritis. No swelling. No gout.  Positive neck pain NEUROLOGIC: No numbness, tingling, or ataxia. No seizure-type activity.  PSYCHIATRIC: No anxiety. No insomnia. No ADD.    Vitals:   Vitals:   07/03/17 1241 07/03/17 1427 07/03/17 1430 07/03/17 1445  BP: (!) 99/56 (!) 129/52 122/62 113/67  Pulse: (!) 52  (!) 54 (!) 53  Resp: 12  15 18   Temp: 98.1 F (36.7 C)     TempSrc: Oral     SpO2: 92% 92% 91% 93%  Weight: 145 lb (65.8 kg)     Height: 5\' 3"  (1.6 m)       Wt Readings from Last 3 Encounters:  07/03/17 145 lb (65.8 kg)  03/27/17 146 lb (66.2 kg)  03/25/16  139 lb (63 kg)     Intake/Output Summary (Last 24 hours) at 07/03/2017 1504 Last data filed at 07/03/2017 0449 Gross per 24 hour  Intake -  Output 0 ml  Net 0 ml    Physical Exam:   GENERAL: Pleasant-appearing in no apparent distress.  HEAD, EYES, EARS, NOSE AND THROAT: Atraumatic, normocephalic. Extraocular muscles are intact. Pupils equal and reactive to light. Sclerae anicteric. No conjunctival injection. No oro-pharyngeal erythema.  NECK: Supple. There is no jugular venous distention. No bruits, no lymphadenopathy, no thyromegaly.  HEART: Regular rate and rhythm,. No murmurs, no rubs,  no clicks.  LUNGS: Clear to auscultation bilaterally. No rales or rhonchi. No wheezes.  ABDOMEN: Soft, flat, nontender, nondistended. Has good bowel sounds. No hepatosplenomegaly appreciated.  EXTREMITIES: No evidence of any cyanosis, clubbing, or peripheral edema.  +2 pedal and radial pulses bilaterally.  NEUROLOGIC: The patient is alert, awake, and oriented x3 with no focal motor or sensory deficits appreciated bilaterally.  SKIN: Moist and warm with no rashes appreciated.  Psych: Not anxious, depressed LN: No inguinal LN enlargement    Antibiotics   Anti-infectives (From admission, onward)   None      Medications   Scheduled Meds: . [MAR Hold] ALPRAZolam  0.5 mg Oral QHS  . [START ON 07/04/2017] aspirin  81 mg Oral Pre-Cath  . [MAR Hold] aspirin EC  325 mg Oral Daily  . [MAR Hold] levothyroxine  112 mcg Oral QAC breakfast  . [MAR Hold] metFORMIN  500 mg Oral Q breakfast  . [MAR Hold] methocarbamol  500 mg Oral BH-q7a  . [MAR Hold] metoprolol tartrate  25 mg Oral BID  . [MAR Hold] multivitamin with minerals  1 tablet Oral Daily  . [MAR Hold] pantoprazole  40 mg Oral Daily  . sodium chloride flush  3 mL Intravenous Q12H  . [MAR Hold] sucralfate  1 g Oral Daily  . [MAR Hold] traMADol  50 mg Oral Daily   Continuous Infusions: . sodium chloride    . [START ON 07/04/2017] sodium chloride  3 mL/kg/hr (07/03/17 1236)   Followed by  . [START ON 07/04/2017] sodium chloride    . sodium chloride    . heparin 800 Units/hr (07/03/17 0652)   PRN Meds:.sodium chloride, [MAR Hold] acetaminophen, acetaminophen, [MAR Hold] gi cocktail, [MAR Hold]  morphine injection, [MAR Hold] nitroGLYCERIN, [MAR Hold] ondansetron (ZOFRAN) IV, ondansetron (ZOFRAN) IV, sodium chloride flush, [MAR Hold] zolpidem   Data Review:   Micro Results No results found for this or any previous visit (from the past 240 hour(s)).  Radiology Reports Dg Chest 2 View  Result Date: 07/02/2017 CLINICAL DATA:  Nausea and dizziness since last evening. EXAM: CHEST - 2 VIEW COMPARISON:  03/30/2013 FINDINGS: Heart size is normal. There is moderate aortic atherosclerosis at the arch without aneurysm. Coarsened interstitial lung markings consistent with chronic smoking related bronchitic change. No alveolar consolidation, effusion or pneumothorax. Biapical pleuroparenchymal scarring and thickening is noted. No acute osseous abnormality. IMPRESSION: Coarsened interstitial lung markings consistent with chronic bronchitic change. Aortic atherosclerosis. Electronically Signed   By: Ashley Royalty M.D.   On: 07/02/2017 21:24   Ct Cervical Spine Wo Contrast  Result Date: 07/02/2017 CLINICAL DATA:  Left-sided neck pain radiating to the left upper extremity EXAM: CT CERVICAL SPINE WITHOUT CONTRAST TECHNIQUE: Multidetector CT imaging of the cervical spine was performed without intravenous contrast. Multiplanar CT image reconstructions were also generated. COMPARISON:  None. FINDINGS: Alignment: Grade 1 anterolisthesis at C5-C6 due to facet hypertrophy. Otherwise normal alignment. Skull base and vertebrae: No acute fracture. Soft tissues and spinal canal: No prevertebral fluid or swelling. No visible canal hematoma. Disc levels: No spinal canal stenosis. There is moderate-to-severe left facet hypertrophy from C2-C3 2 C5-C6. This results in  moderate foraminal stenosis at C4-5 and C5-6. There is also mild-to-moderate right neural foraminal stenosis at C6-7 due to uncovertebral hypertrophy. Upper chest: Nodular opacity of the right lung apex is favored to be atelectasis or fibrosis. Other: Normal visualized paraspinal cervical soft tissues. IMPRESSION: 1. No acute fracture of the cervical spine. 2. 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 Electronically Signed   By: Ulyses Jarred M.D.   On: 07/02/2017 22:21     CBC Recent Labs  Lab 07/02/17 2049 07/03/17 0502  WBC 6.9 6.2  HGB 13.8 12.2  HCT 40.3 36.3  PLT 217 181  MCV 89.4 90.6  MCH 30.7 30.3  MCHC 34.4 33.5  RDW 13.5 13.4    Chemistries  Recent Labs  Lab 07/02/17 2049 07/03/17 0502  NA 137  --   K 4.4  --   CL 99*  --   CO2 26  --   GLUCOSE 164*  --   BUN 16  --   CREATININE 1.11* 1.00  CALCIUM 9.8  --    ------------------------------------------------------------------------------------------------------------------ estimated creatinine clearance is 44.4 mL/min (by C-G formula based on SCr of 1 mg/dL). ------------------------------------------------------------------------------------------------------------------ No results for input(s): HGBA1C in the last 72 hours. ------------------------------------------------------------------------------------------------------------------ Recent Labs    07/03/17 0906  CHOL 223*  HDL 41  LDLCALC 150*  TRIG 160*  CHOLHDL 5.4   ------------------------------------------------------------------------------------------------------------------ Recent Labs    07/02/17 2340  TSH 0.610   ------------------------------------------------------------------------------------------------------------------ No results for input(s): VITAMINB12, FOLATE, FERRITIN, TIBC, IRON, RETICCTPCT in the last 72 hours.  Coagulation profile Recent Labs  Lab  07/03/17 0715  INR 1.07    No results for input(s): DDIMER in the last 72 hours.  Cardiac Enzymes Recent Labs  Lab 07/02/17 2340 07/03/17 0502 07/03/17 0906  TROPONINI 0.10* 0.22* 0.24*   ------------------------------------------------------------------------------------------------------------------ Invalid input(s): POCBNP    Assessment & Plan   89 y f with chest pain  *Non-ST MI Further recommendation per cardiology possible CABG Continue therapy with aspirin Continue metoprolol ACE inhibitor Hines density statin   * Essential hypertension we will start ACE inhibitor if her blood pressure stable Continue metoprolol  * Hypothyroidism - continue synthroid,   *Diabetes type 2 discontinue metformin since patient received dye Will resume in 48 hours Continue sliding scale insulin   * Anxiety - continue xanax       Code Status Orders  (From admission, onward)        Start     Ordered   07/03/17 0140  Full code  Continuous     07/03/17 0139    Code Status History    Date Active Date Inactive Code Status Order ID Comments User Context   This patient has a current code status but no historical code status.           Consults cardiology  DVT Prophylaxis  Lovenox   Lab Results  Component Value Date   PLT 181 07/03/2017     Time Spent in minutes  25min Greater than 50% of time spent in care coordination and counseling patient regarding the condition and plan of care.   Dustin Flock M.D on 07/03/2017 at 3:04 PM  Between 7am to 6pm - Pager - [phone]  After 6pm go to [email or web address] - Proofreader  [employer]   Office  [phone]

## 2017-07-03 NOTE — Progress Notes (Signed)
Dr. Nehemiah Massed at bedside, per MD to restart heparin at same rate  800 units/hr at 1700 today.

## 2017-07-03 NOTE — Progress Notes (Signed)
Dr. Nehemiah Massed paged regarding restart time for Heparin gtt post heart cath. Awaiting call back. Pt resting, VSS

## 2017-07-03 NOTE — Progress Notes (Addendum)
Patient awakened and updated on plan of care, new medication, and safety precautions on heparin drip.  Pt verbalizes understanding of new plan.

## 2017-07-04 LAB — BASIC METABOLIC PANEL
Anion gap: 4 — ABNORMAL LOW (ref 5–15)
BUN: 14 mg/dL (ref 6–20)
CHLORIDE: 108 mmol/L (ref 101–111)
CO2: 29 mmol/L (ref 22–32)
CREATININE: 1.03 mg/dL — AB (ref 0.44–1.00)
Calcium: 8.7 mg/dL — ABNORMAL LOW (ref 8.9–10.3)
GFR calc Af Amer: >60 mL/min (ref >60)
GFR, EST NON AFRICAN AMERICAN: 52 mL/min — AB (ref >60)
GLUCOSE: 100 mg/dL — AB (ref 65–99)
POTASSIUM: 4.2 mmol/L (ref 3.5–5.1)
Sodium: 141 mmol/L (ref 135–145)

## 2017-07-04 LAB — GLUCOSE, CAPILLARY
GLUCOSE-CAPILLARY: 129 mg/dL — AB (ref 65–99)
GLUCOSE-CAPILLARY: 124 mg/dL — AB (ref 65–99)
Glucose-Capillary: 114 mg/dL — ABNORMAL HIGH (ref 65–99)

## 2017-07-04 LAB — CBC
HCT: 35.7 % (ref 35.0–47.0)
Hemoglobin: 11.9 g/dL — ABNORMAL LOW (ref 12.0–16.0)
MCH: 30.4 pg (ref 26.0–34.0)
MCHC: 33.3 g/dL (ref 32.0–36.0)
MCV: 91.4 fL (ref 80.0–100.0)
PLATELETS: 162 10*3/uL (ref 150–440)
RBC: 3.91 MIL/uL (ref 3.80–5.20)
RDW: 13.6 % (ref 11.5–14.5)
WBC: 6.4 10*3/uL (ref 3.6–11.0)

## 2017-07-04 LAB — HEPARIN LEVEL (UNFRACTIONATED)
Heparin Unfractionated: 0.19 IU/mL — ABNORMAL LOW (ref 0.30–0.70)
Heparin Unfractionated: 0.39 IU/mL (ref 0.30–0.70)
Heparin Unfractionated: 0.33 IU/mL (ref 0.30–0.70)

## 2017-07-04 MED ORDER — HEPARIN BOLUS VIA INFUSION
2000.0000 [IU] | Freq: Once | INTRAVENOUS | Status: AC
  Administered 2017-07-04: 2000 [IU] via INTRAVENOUS
  Filled 2017-07-04: qty 2000

## 2017-07-04 MED ORDER — HEPARIN (PORCINE) IN NACL 100-0.45 UNIT/ML-% IJ SOLN: 900.0000 [IU]/h | INTRAMUSCULAR | 0 refills | Status: DC

## 2017-07-04 MED ORDER — PROMETHAZINE HCL 25 MG/ML IJ SOLN: 12.5000 mg | Freq: Once | INTRAMUSCULAR | Status: AC

## 2017-07-04 MED ORDER — SODIUM CHLORIDE 0.9% FLUSH
3.0000 mL | Freq: Two times a day (BID) | INTRAVENOUS | Status: DC
  Administered 2017-07-04 (×3): 3 mL via INTRAVENOUS

## 2017-07-04 MED ORDER — HEPARIN BOLUS VIA INFUSION
2000.0000 [IU] | Freq: Once | INTRAVENOUS | Status: DC
  Filled 2017-07-04: qty 2000

## 2017-07-04 MED ORDER — ALPRAZOLAM 0.25 MG PO TABS: 0.2500 mg | ORAL_TABLET | Freq: Two times a day (BID) | ORAL | Status: DC

## 2017-07-04 MED ORDER — PROMETHAZINE HCL 25 MG/ML IJ SOLN
12.5000 mg | Freq: Four times a day (QID) | INTRAMUSCULAR | Status: DC | PRN
Start: 2017-07-04 — End: 2017-07-04
  Administered 2017-07-04: 12.5 mg via INTRAVENOUS
  Filled 2017-07-04: qty 1

## 2017-07-04 MED ORDER — METOPROLOL TARTRATE 25 MG PO TABS: 25.0000 mg | ORAL_TABLET | Freq: Two times a day (BID) | ORAL | 0 refills | Status: DC

## 2017-07-04 NOTE — Progress Notes (Signed)
Patient complains of nausea and vomiting several doses of zofran given with little relief. Dr Posey Pronto notified orders for 12.5 mg of phenergan.

## 2017-07-04 NOTE — Progress Notes (Signed)
Pt. Slept well throughout the night with c/o right buttock pain x2, she was medicated with effective results each time. HR ran SB in mid to high 50's. Heparin gtt continues. Will continue to monitor pt.

## 2017-07-04 NOTE — Progress Notes (Signed)
ANTICOAGULATION CONSULT NOTE - Initial Consult  Pharmacy Consult for heparin Indication: chest pain/ACS  Allergies  Allergen Reactions  . Ace Inhibitors     Other reaction(s): Cough  . Atorvastatin     Other reaction(s): Muscle Pain  . Statins     Other reaction(s): Muscle Pain Muscle pain  . Sulfa Antibiotics     Other reaction(s): Unknown    Patient Measurements: Height: 5\' 3"  (160 cm) Weight: 145 lb 6.4 oz (66 kg) IBW/kg (Calculated) : 52.4 Heparin Dosing Weight: 66 kg  Vital Signs: Temp: 98.7 F (37.1 C) (03/16 1614) Temp Source: Oral (03/16 1614) BP: 126/68 (03/16 1614) Pulse Rate: 62 (03/16 1614)  Labs: Recent Labs    07/02/17 2049  07/03/17 0502 07/03/17 0715 07/03/17 0906 07/03/17 1641 07/04/17 0137 07/04/17 0925 07/04/17 1636  HGB 13.8  --  12.2  --   --   --  11.9*  --   --   HCT 40.3  --  36.3  --   --   --  35.7  --   --   PLT 217  --  181  --   --   --  162  --   --   APTT  --   --   --  >160*  --   --   --   --   --   LABPROT  --   --   --  13.8  --   --   --   --   --   INR  --   --   --  1.07  --   --   --   --   --   HEPARINUNFRC  --   --   --   --   --   --  0.19* 0.39 0.33  CREATININE 1.11*  --  1.00  --   --   --  1.03*  --   --   TROPONINI 0.04*   < > 0.22*  --  0.24* 0.17*  --   --   --    < > = values in this interval not displayed.    Estimated Creatinine Clearance: 43.1 mL/min (A) (by C-G formula based on SCr of 1.03 mg/dL (H)).   Medical History: Past Medical History:  Diagnosis Date  . Carotid artery occlusion   . Hypertension   . Myocardial infarction ([place])     Medications:  Scheduled:  . ALPRAZolam  0.25 mg Oral BID  . aspirin EC  325 mg Oral Daily  . insulin aspart  0-9 Units Subcutaneous TID WC  . levothyroxine  112 mcg Oral QAC breakfast  . methocarbamol  500 mg Oral BH-q7a  . metoprolol tartrate  25 mg Oral BID  . multivitamin with minerals  1 tablet Oral Daily  . pantoprazole  40 mg Oral Daily  . sodium  chloride flush  3 mL Intravenous Q12H  . sucralfate  1 g Oral Daily  . traMADol  50 mg Oral Daily    Assessment: Patient admitted for SOB found to have rising trops 0.10 >> 0.22. Patient does not take any PTA anticoagulants. Heparin drip on hold for cath, per note from RN, Dr. Nehemiah Massed would like to start heparin back at same rate (800units/hr) at 1700 today.   Goal of Therapy:  Heparin level 0.3-0.7 units/ml Monitor platelets by anticoagulation protocol: Yes   Plan:  HL = 0.33 is therapeutic. Continue heparin at current infusion rate of 900 units/hr. Patient  has had two consecutive therapeutic HL's and will therefore order daily heparin levels and CBC.   Candelaria Stagers, PharmD Pharmacy Resident  07/04/2017

## 2017-07-04 NOTE — Progress Notes (Signed)
Patient had abdominal pain; bladder scan 366.  MD verbal order for in & out cath.  In & out cath done, just over 500 mL in urine was removed.  Patient said she felt better. Phillis Knack, RN

## 2017-07-04 NOTE — Progress Notes (Signed)
[facility] Encounter Note  Patient: Christine Obrien / Admit Date: 07/02/2017 / Date of Encounter: 07/04/2017, 9:20 AM   Subjective: Patient with left upper chest and neck pain but now worse than before but significantly painful with palpation consistent with musculoskeletal abnormality.  Heart rate controlled.  Normal elevation of troponin consistent with non-ST elevation myocardial infarction Cardiac catheterization showing significant left main coronary atherosclerosis of 75% with ostial LAD stenosis of 70% and in-stent stenosis of circumflex of 85% stenosis with patent 30% stenosis of right coronary artery stent  Review of Systems: Positive for: Left neck pain Negative for: Vision change, hearing change, syncope, dizziness, nausea, vomiting,diarrhea, bloody stool, stomach pain, cough, congestion, diaphoresis, urinary frequency, urinary pain,skin lesions, skin rashes Others previously listed  Objective: Telemetry: Sinus rhythm with left bundle branch block Physical Exam: Blood pressure (!) 154/53, pulse 63, temperature 97.8 F (36.6 C), temperature source Oral, resp. rate 18, height 5\' 3"  (1.6 m), weight 145 lb 6.4 oz (66 kg), SpO2 96 %. Body mass index is 25.76 kg/m. General: Well developed, well nourished, in no acute distress. Head: Normocephalic, atraumatic, sclera non-icteric, no xanthomas, nares are without discharge. Neck: No apparent masses Lungs: Normal respirations with some wheezes, no rhonchi, no rales , no crackles   Heart: Regular rate and rhythm, normal S1 S2, no murmur, no rub, no gallop, PMI is normal size and placement, carotid upstroke normal without bruit, jugular venous pressure normal Abdomen: Soft, non-tender, non-distended with normoactive bowel sounds. No hepatosplenomegaly. Abdominal aorta is normal size without bruit Extremities: No edema, no clubbing, no cyanosis, no ulcers,  Peripheral: 2+ radial, 2+ femoral, 2+ dorsal pedal pulses Neuro:  Alert and oriented. Moves all extremities spontaneously. Psych:  Responds to questions appropriately with a normal affect.   Intake/Output Summary (Last 24 hours) at 07/04/2017 0920 Last data filed at 07/04/2017 0912 Gross per 24 hour  Intake 454.47 ml  Output 1700 ml  Net -1245.53 ml    Inpatient Medications:  . ALPRAZolam  0.5 mg Oral QHS  . aspirin EC  325 mg Oral Daily  . insulin aspart  0-9 Units Subcutaneous TID WC  . levothyroxine  112 mcg Oral QAC breakfast  . methocarbamol  500 mg Oral BH-q7a  . metoprolol tartrate  25 mg Oral BID  . multivitamin with minerals  1 tablet Oral Daily  . pantoprazole  40 mg Oral Daily  . sodium chloride flush  3 mL Intravenous Q12H  . sucralfate  1 g Oral Daily  . traMADol  50 mg Oral Daily   Infusions:  . heparin 900 Units/hr (07/04/17 0454)    Labs: Recent Labs    07/02/17 2049 07/03/17 0502 07/04/17 0137  NA 137  --  141  K 4.4  --  4.2  CL 99*  --  108  CO2 26  --  29  GLUCOSE 164*  --  100*  BUN 16  --  14  CREATININE 1.11* 1.00 1.03*  CALCIUM 9.8  --  8.7*   No results for input(s): AST, ALT, ALKPHOS, BILITOT, PROT, ALBUMIN in the last 72 hours. Recent Labs    07/03/17 0502 07/04/17 0137  WBC 6.2 6.4  HGB 12.2 11.9*  HCT 36.3 35.7  MCV 90.6 91.4  PLT 181 162   Recent Labs    07/02/17 2340 07/03/17 0502 07/03/17 0906 07/03/17 1641  TROPONINI 0.10* 0.22* 0.24* 0.17*   Invalid input(s): POCBNP No results for input(s): HGBA1C in the last 72 hours.  Weights: Filed Weights   07/03/17 0138 07/03/17 1241 07/04/17 0437  Weight: 145 lb 3.2 oz (65.9 kg) 145 lb (65.8 kg) 145 lb 6.4 oz (66 kg)     Radiology/Studies:  Dg Chest 2 View  Result Date: 07/02/2017 CLINICAL DATA:  Nausea and dizziness since last evening. EXAM: CHEST - 2 VIEW COMPARISON:  03/30/2013 FINDINGS: Heart size is normal. There is moderate aortic atherosclerosis at the arch without aneurysm. Coarsened interstitial lung markings consistent with  chronic smoking related bronchitic change. No alveolar consolidation, effusion or pneumothorax. Biapical pleuroparenchymal scarring and thickening is noted. No acute osseous abnormality. IMPRESSION: Coarsened interstitial lung markings consistent with chronic bronchitic change. Aortic atherosclerosis. Electronically Signed   By: Ashley Royalty M.D.   On: 07/02/2017 21:24   Ct Cervical Spine Wo Contrast  Result Date: 07/02/2017 CLINICAL DATA:  Left-sided neck pain radiating to the left upper extremity EXAM: CT CERVICAL SPINE WITHOUT CONTRAST TECHNIQUE: Multidetector CT imaging of the cervical spine was performed without intravenous contrast. Multiplanar CT image reconstructions were also generated. COMPARISON:  None. FINDINGS: Alignment: Grade 1 anterolisthesis at C5-C6 due to facet hypertrophy. Otherwise normal alignment. Skull base and vertebrae: No acute fracture. Soft tissues and spinal canal: No prevertebral fluid or swelling. No visible canal hematoma. Disc levels: No spinal canal stenosis. There is moderate-to-severe left facet hypertrophy from C2-C3 2 C5-C6. This results in moderate foraminal stenosis at C4-5 and C5-6. There is also mild-to-moderate right neural foraminal stenosis at C6-7 due to uncovertebral hypertrophy. Upper chest: Nodular opacity of the right lung apex is favored to be atelectasis or fibrosis. Other: Normal visualized paraspinal cervical soft tissues. IMPRESSION: 1. No acute fracture of the cervical spine. 2. 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 Electronically Signed   By: Ulyses Jarred M.D.   On: 07/02/2017 22:21     Assessment and Recommendation  77 y.o. female with known coronary disease status post previous microinfarction PCI and stent placement of right coronary artery as well as circumflex artery with history of left bundle branch block having new onset of chest discomfort left neck pain and worsening  left bundle branch block with a non-ST elevation myocardial infarction Cardiac catheterization showing significant ostial left main atherosclerosis with significant ostial LAD and mid circumflex artery stenosis 1.  Continue aspirin for further risk reduction cardiovascular event 2.  Metoprolol for heart rate control and myocardial infarction 3.  ACE inhibitor as necessary for hypertension control 4.  High intensity cholesterol therapy 5.  Patient to stop smoking 6.  Further consideration of transfer for coronary artery bypass grafting due to ostial left main coronary atherosclerosis with non-ST elevation myocardial infarction.  Patient is slated for transfer to [facility].  Further consideration of muscle relaxant for left neck and trapezius spasm and pain with rehabilitation treatment as well  Signed, Serafina Royals M.D. FACC

## 2017-07-04 NOTE — Progress Notes (Signed)
The patient is alert and oriented x 4. Patient just left the hospital accompanied by 2 [employer] employees and report given to them. No acute distress noted. Tele box removed.  Patient left with Heparin infusing at 9cc/hour. The family was at bedside voiced no concerns.

## 2017-07-04 NOTE — Progress Notes (Signed)
Patient complains of excruciating neck pain radiating down arm. Dr Nehemiah Massed on floor and notified. Went to room and assessed patient and states that he thinks her pain is more musculoskeletal than cardiac at this time due to pain upon palpation. PRN morphine given will reassess.

## 2017-07-04 NOTE — Progress Notes (Signed)
Patient still complaining of excruciating neck pain states that the morphine does help for a little bit but the pain comes back. She has had a couple episodes of vomiting. Dr Nehemiah Massed notified to see if he wanted to order and EKG or [facility] stated to hold off at this time. Patient has been accepted at [facility] and states that she is stable for transfer. Will continue to monitor at this time.

## 2017-07-04 NOTE — Plan of Care (Signed)
Patient tends to worry and be nervous.  She acceps information and education and asks questions.

## 2017-07-04 NOTE — Progress Notes (Signed)
Report called to [facility] at [employer]. [employer] notified for transport of patient stated that they would not have anyone available until after 9 pm. Patient resting comfortably at this time with call light in reach and bed in the lowest position and bed alarm on.

## 2017-07-04 NOTE — Progress Notes (Signed)
ANTICOAGULATION CONSULT NOTE - Initial Consult  Pharmacy Consult for heparin Indication: chest pain/ACS  Allergies  Allergen Reactions  . Ace Inhibitors     Other reaction(s): Cough  . Atorvastatin     Other reaction(s): Muscle Pain  . Statins     Other reaction(s): Muscle Pain Muscle pain  . Sulfa Antibiotics     Other reaction(s): Unknown    Patient Measurements: Height: 5\' 3"  (160 cm) Weight: 145 lb (65.8 kg) IBW/kg (Calculated) : 52.4 Heparin Dosing Weight: 66 kg  Vital Signs: Temp: 98.8 F (37.1 C) (03/15 2007) Temp Source: Oral (03/15 2007) BP: 127/57 (03/16 0219) Pulse Rate: 58 (03/16 0219)  Labs: Recent Labs    07/02/17 2049  07/03/17 0502 07/03/17 0715 07/03/17 0906 07/03/17 1641 07/04/17 0137  HGB 13.8  --  12.2  --   --   --  11.9*  HCT 40.3  --  36.3  --   --   --  35.7  PLT 217  --  181  --   --   --  162  APTT  --   --   --  >160*  --   --   --   LABPROT  --   --   --  13.8  --   --   --   INR  --   --   --  1.07  --   --   --   HEPARINUNFRC  --   --   --   --   --   --  0.19*  CREATININE 1.11*  --  1.00  --   --   --  1.03*  TROPONINI 0.04*   < > 0.22*  --  0.24* 0.17*  --    < > = values in this interval not displayed.    Estimated Creatinine Clearance: 43.1 mL/min (A) (by C-G formula based on SCr of 1.03 mg/dL (H)).   Medical History: Past Medical History:  Diagnosis Date  . Carotid artery occlusion   . Hypertension   . Myocardial infarction ([place])     Medications:  Scheduled:  . ALPRAZolam  0.5 mg Oral QHS  . aspirin EC  325 mg Oral Daily  . heparin  2,000 Units Intravenous Once  . insulin aspart  0-9 Units Subcutaneous TID WC  . levothyroxine  112 mcg Oral QAC breakfast  . methocarbamol  500 mg Oral BH-q7a  . metoprolol tartrate  25 mg Oral BID  . multivitamin with minerals  1 tablet Oral Daily  . pantoprazole  40 mg Oral Daily  . sucralfate  1 g Oral Daily  . traMADol  50 mg Oral Daily    Assessment: Patient admitted  for SOB found to have rising trops 0.10 >> 0.22. Patient does not take any PTA anticoagulants. Heparin drip on hold for cath, per note from RN, Dr. Nehemiah Massed would like to start heparin back at same rate (800units/hr) at 1700 today.   Goal of Therapy:  Heparin level 0.3-0.7 units/ml Monitor platelets by anticoagulation protocol: Yes   Plan:  Will resume heparin drip 800 units/hr @ 1700 Will check a HL @ 1300 8 hours after start  Will monitor daily CBC's and adjust per HL's   03/16 @ 0130 HL 0.19 subtherapeutic. Will rebolus w/ heparin 2000 units IV x 1 and will increase rate to 900 units/hr and will recheck HL @ 0930, Hgb slightly trending down will continue to monitor.  Tobie Lords, PharmD, BCPS Clinical Pharmacist 07/04/2017

## 2017-07-04 NOTE — Discharge Summary (Signed)
[place] at [place] NAME: Christine Obrien    MR#:  [id number]  DATE OF BIRTH:  1940-04-22  DATE OF ADMISSION:  07/02/2017 ADMITTING PHYSICIAN: Max Sane, MD  DATE OF DISCHARGE: 07/04/2017  PRIMARY CARE PHYSICIAN: [name], MD    ADMISSION DIAGNOSIS:  Neck pain [M54.2] Elevated troponin [R74.8] Coronary artery disease of native heart with stable angina pectoris, unspecified vessel or lesion type ([place]) [I25.118]  DISCHARGE DIAGNOSIS:  Acute NSTEMI s/p Cardiac Cath---to [facility] for CABG  SECONDARY DIAGNOSIS:   Past Medical History:  Diagnosis Date  . Carotid artery occlusion   . Hypertension   . Myocardial infarction [facility])     HOSPITAL COURSE:  Christine Obrien  is a 77 y.o. female with a known history of CAD, HTN is being admitted for chest pain r/o. C/o intermittent moderate to severe throbbing aching left lateral neck pain radiating down her lateral left arm terminating at her thumb for last 2 days   *Non-STEMI -Further recommendation per cardiology possible CABG to [facility] per dr Nehemiah Massed. EMTALA form done by cardiology -on Heparin gtt -Continue  aspirin -Continue metoprolol and statin. Pt allergic to ACE  -Cardiac cath showed Prox RCA to Mid RCA lesion is 30% stenosed.  Prox Cx to Mid Cx lesion is 85% stenosed.  Ost LAD lesion is 75% stenosed.  Ost LM lesion is 75% stenosed.  Mid LM lesion is 50% stenosed.   *Essential hypertension  Continue metoprolol   * Hypothyroidism - continue synthroid  *Diabetes type 2 discontinue metformin since patient received dye Will resume in 48 hours--currently off metformin Continue sliding scale insulin  * Anxiety - continue xanax  * left neck and back pain--appears acute on chronic and musculoskeletal -prn robaxin  D/c to [facility] when bed opens up. Pt agreeable.  CONSULTS OBTAINED:  Treatment Team:  Corey Skains, MD  DRUG ALLERGIES:   Allergies  Allergen  Reactions  . Ace Inhibitors     Other reaction(s): Cough  . Atorvastatin     Other reaction(s): Muscle Pain  . Statins     Other reaction(s): Muscle Pain Muscle pain  . Sulfa Antibiotics     Other reaction(s): Unknown    DISCHARGE MEDICATIONS:   Allergies as of 07/04/2017      Reactions   Ace Inhibitors    Other reaction(s): Cough   Atorvastatin    Other reaction(s): Muscle Pain   Statins    Other reaction(s): Muscle Pain Muscle pain   Sulfa Antibiotics    Other reaction(s): Unknown      Medication List    STOP taking these medications   metFORMIN 500 MG tablet Commonly known as:  GLUCOPHAGE     TAKE these medications   ALPRAZolam 0.5 MG tablet Commonly known as:  XANAX Take 0.5 mg by mouth at bedtime.   aspirin EC 81 MG tablet Take 81 mg by mouth.   citalopram 20 MG tablet Commonly known as:  CELEXA AT THE START OF THERAPY, TAKE ONE-HALF (1/2) TABLET DAILY, CAN INCREASE TO 1 TABLET DAILY THEREAFTER IF NEEDED AFTER 2 TO 3 WEEKS   ferrous sulfate 325 (65 FE) MG tablet Take 325 mg by mouth.   fluticasone 50 MCG/ACT nasal spray Commonly known as:  FLONASE instill 2 sprays into each nostril once daily   heparin 100-0.45 UNIT/ML-% infusion Inject 900 Units/hr into the vein continuous.   HYDROcodone-acetaminophen 5-325 MG tablet Commonly known as:  NORCO Take 1 tablet by mouth every  6 (six) hours as needed for up to 7 doses for severe pain.   LEVOXYL 112 MCG tablet Generic drug:  levothyroxine Take 112 mcg by mouth daily before breakfast.   meloxicam 15 MG tablet Commonly known as:  MOBIC Take 15 mg by mouth daily.   methocarbamol 500 MG tablet Commonly known as:  ROBAXIN Take 500 mg by mouth every morning.   metoprolol tartrate 25 MG tablet Commonly known as:  LOPRESSOR Take 1 tablet (25 mg total) by mouth 2 (two) times daily.   multivitamin capsule Take 1 capsule by mouth daily.   nitroGLYCERIN 0.4 MG SL tablet Commonly known as:   NITROSTAT 1 tablet(s) sublingual as needed for chest pain (may repeat every 5 minutes but seek medical help if pain persists after 3 tablets)   omeprazole 20 MG capsule Commonly known as:  PRILOSEC Take 20 mg by mouth daily.   ondansetron 8 MG tablet Commonly known as:  ZOFRAN Take 8 mg by mouth.   sucralfate 1 g tablet Commonly known as:  CARAFATE Take 1 g by mouth daily.   traMADol 50 MG tablet Commonly known as:  ULTRAM Take 50 mg by mouth daily.   TYLENOL 325 MG tablet Generic drug:  acetaminophen Tylenol       If you experience worsening of your admission symptoms, develop shortness of breath, life threatening emergency, suicidal or homicidal thoughts you must seek medical attention immediately by calling 911 or calling your MD immediately  if symptoms less severe.  You Must read complete instructions/literature along with all the possible adverse reactions/side effects for all the Medicines you take and that have been prescribed to you. Take any new Medicines after you have completely understood and accept all the possible adverse reactions/side effects.   Please note  You were cared for by a hospitalist during your hospital stay. If you have any questions about your discharge medications or the care you received while you were in the hospital after you are discharged, you can call the unit and asked to speak with the hospitalist on call if the hospitalist that took care of you is not available. Once you are discharged, your primary care physician will handle any further medical issues. Please note that NO REFILLS for any discharge medications will be authorized once you are discharged, as it is imperative that you return to your primary care physician (or establish a relationship with a primary care physician if you do not have one) for your aftercare needs so that they can reassess your need for medications and monitor your lab values. Today   SUBJECTIVE   Left neck  pain--acute on chronic  VITAL SIGNS:  Blood pressure (!) 138/45, pulse 65, temperature 97.8 F (36.6 C), temperature source Oral, resp. rate 18, height 5\' 3"  (1.6 m), weight 66 kg (145 lb 6.4 oz), SpO2 90 %.  I/O:    Intake/Output Summary (Last 24 hours) at 07/04/2017 1022 Last data filed at 07/04/2017 1002 Gross per 24 hour  Intake 1054.47 ml  Output 1700 ml  Net -645.53 ml    PHYSICAL EXAMINATION:  GENERAL:  77 y.o.-year-old patient lying in the bed with no acute distress.  EYES: Pupils equal, round, reactive to light and accommodation. No scleral icterus. Extraocular muscles intact.  HEENT: Head atraumatic, normocephalic. Oropharynx and nasopharynx clear.  NECK:  Supple, no jugular venous distention. No thyroid enlargement, no tenderness.  LUNGS: Normal breath sounds bilaterally, no wheezing, rales,rhonchi or crepitation. No use of accessory muscles of respiration.  CARDIOVASCULAR: S1, S2 normal. No murmurs, rubs, or gallops.  ABDOMEN: Soft, non-tender, non-distended. Bowel sounds present. No organomegaly or mass.  EXTREMITIES: No pedal edema, cyanosis, or clubbing.  NEUROLOGIC: Cranial nerves II through XII are intact. Muscle strength 5/5 in all extremities. Sensation intact. Gait not checked.  PSYCHIATRIC: The patient is alert and oriented x 3.  SKIN: No obvious rash, lesion, or ulcer.   DATA REVIEW:   CBC  Recent Labs  Lab 07/04/17 0137  WBC 6.4  HGB 11.9*  HCT 35.7  PLT 162    Chemistries  Recent Labs  Lab 07/04/17 0137  NA 141  K 4.2  CL 108  CO2 29  GLUCOSE 100*  BUN 14  CREATININE 1.03*  CALCIUM 8.7*    Microbiology Results   No results found for this or any previous visit (from the past 240 hour(s)).  RADIOLOGY:  Dg Chest 2 View  Result Date: 07/02/2017 CLINICAL DATA:  Nausea and dizziness since last evening. EXAM: CHEST - 2 VIEW COMPARISON:  03/30/2013 FINDINGS: Heart size is normal. There is moderate aortic atherosclerosis at the arch without  aneurysm. Coarsened interstitial lung markings consistent with chronic smoking related bronchitic change. No alveolar consolidation, effusion or pneumothorax. Biapical pleuroparenchymal scarring and thickening is noted. No acute osseous abnormality. IMPRESSION: Coarsened interstitial lung markings consistent with chronic bronchitic change. Aortic atherosclerosis. Electronically Signed   By: Ashley Royalty M.D.   On: 07/02/2017 21:24   Ct Cervical Spine Wo Contrast  Result Date: 07/02/2017 CLINICAL DATA:  Left-sided neck pain radiating to the left upper extremity EXAM: CT CERVICAL SPINE WITHOUT CONTRAST TECHNIQUE: Multidetector CT imaging of the cervical spine was performed without intravenous contrast. Multiplanar CT image reconstructions were also generated. COMPARISON:  None. FINDINGS: Alignment: Grade 1 anterolisthesis at C5-C6 due to facet hypertrophy. Otherwise normal alignment. Skull base and vertebrae: No acute fracture. Soft tissues and spinal canal: No prevertebral fluid or swelling. No visible canal hematoma. Disc levels: No spinal canal stenosis. There is moderate-to-severe left facet hypertrophy from C2-C3 2 C5-C6. This results in moderate foraminal stenosis at C4-5 and C5-6. There is also mild-to-moderate right neural foraminal stenosis at C6-7 due to uncovertebral hypertrophy. Upper chest: Nodular opacity of the right lung apex is favored to be atelectasis or fibrosis. Other: Normal visualized paraspinal cervical soft tissues. IMPRESSION: 1. No acute fracture of the cervical spine. 2. 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 Electronically Signed   By: Ulyses Jarred M.D.   On: 07/02/2017 22:21     Management plans discussed with the patient, family and they are in agreement.  CODE STATUS:     Code Status Orders  (From admission, onward)        Start     Ordered   07/03/17 0140  Full code  Continuous     07/03/17  0139    Code Status History    Date Active Date Inactive Code Status Order ID Comments User Context   This patient has a current code status but no historical code status.      TOTAL TIME TAKING CARE OF THIS PATIENT: 40 minutes.    Fritzi Mandes M.D on 07/04/2017 at 10:22 AM  Between 7am to 6pm - Pager - [phone] After 6pm go to [email or web address] - password [email or web address] [facility]  Office  [phone]  CC: Primary care physician; [name], MD

## 2017-07-04 NOTE — Progress Notes (Signed)
ANTICOAGULATION CONSULT NOTE - Initial Consult  Pharmacy Consult for heparin Indication: chest pain/ACS  Allergies  Allergen Reactions  . Ace Inhibitors     Other reaction(s): Cough  . Atorvastatin     Other reaction(s): Muscle Pain  . Statins     Other reaction(s): Muscle Pain Muscle pain  . Sulfa Antibiotics     Other reaction(s): Unknown    Patient Measurements: Height: 5\' 3"  (160 cm) Weight: 145 lb 6.4 oz (66 kg) IBW/kg (Calculated) : 52.4 Heparin Dosing Weight: 66 kg  Vital Signs: Temp: 97.8 F (36.6 C) (03/16 0739) Temp Source: Oral (03/16 0739) BP: 138/45 (03/16 0921) Pulse Rate: 65 (03/16 0921)  Labs: Recent Labs    07/02/17 2049  07/03/17 0502 07/03/17 0715 07/03/17 0906 07/03/17 1641 07/04/17 0137 07/04/17 0925  HGB 13.8  --  12.2  --   --   --  11.9*  --   HCT 40.3  --  36.3  --   --   --  35.7  --   PLT 217  --  181  --   --   --  162  --   APTT  --   --   --  >160*  --   --   --   --   LABPROT  --   --   --  13.8  --   --   --   --   INR  --   --   --  1.07  --   --   --   --   HEPARINUNFRC  --   --   --   --   --   --  0.19* 0.39  CREATININE 1.11*  --  1.00  --   --   --  1.03*  --   TROPONINI 0.04*   < > 0.22*  --  0.24* 0.17*  --   --    < > = values in this interval not displayed.    Estimated Creatinine Clearance: 43.1 mL/min (A) (by C-G formula based on SCr of 1.03 mg/dL (H)).   Medical History: Past Medical History:  Diagnosis Date  . Carotid artery occlusion   . Hypertension   . Myocardial infarction ([place])     Medications:  Scheduled:  . ALPRAZolam  0.5 mg Oral QHS  . aspirin EC  325 mg Oral Daily  . insulin aspart  0-9 Units Subcutaneous TID WC  . levothyroxine  112 mcg Oral QAC breakfast  . methocarbamol  500 mg Oral BH-q7a  . metoprolol tartrate  25 mg Oral BID  . multivitamin with minerals  1 tablet Oral Daily  . pantoprazole  40 mg Oral Daily  . sodium chloride flush  3 mL Intravenous Q12H  . sucralfate  1 g Oral  Daily  . traMADol  50 mg Oral Daily    Assessment: Patient admitted for SOB found to have rising trops 0.10 >> 0.22. Patient does not take any PTA anticoagulants. Heparin drip on hold for cath, per note from RN, Dr. Nehemiah Massed would like to start heparin back at same rate (800units/hr) at 1700 today.   Goal of Therapy:  Heparin level 0.3-0.7 units/ml Monitor platelets by anticoagulation protocol: Yes   Plan:  HL = 0.39 is therapeutic. Continue heparin at current infusion rate of 900 units/hr and order confirmatory HL in 6 hours. CBC ordered with AM labs tomorrow.  Lenis Noon, PharmD, BCPS Clinical Pharmacist 07/04/2017

## 2017-07-06 ENCOUNTER — Encounter: Payer: Self-pay | Admitting: Internal Medicine

## 2017-07-06 DIAGNOSIS — Z955 Presence of coronary angioplasty implant and graft: Secondary | ICD-10-CM | POA: Insufficient documentation

## 2017-07-06 DIAGNOSIS — I252 Old myocardial infarction: Secondary | ICD-10-CM | POA: Insufficient documentation

## 2017-07-06 MED ORDER — GENERIC EXTERNAL MEDICATION
125.00 | Status: DC
Start: 2017-07-07 — End: 2017-07-06

## 2017-07-06 MED ORDER — MELATONIN 3 MG PO TABS
3.00 | ORAL_TABLET | ORAL | Status: DC
Start: 2017-07-06 — End: 2017-07-06

## 2017-07-06 MED ORDER — LIDOCAINE 5 % EX PTCH
2.00 | MEDICATED_PATCH | CUTANEOUS | Status: DC
Start: 2017-07-07 — End: 2017-07-06

## 2017-07-06 MED ORDER — ACETAMINOPHEN 325 MG PO TABS
700.00 | ORAL_TABLET | ORAL | Status: DC
Start: ? — End: 2017-07-06

## 2017-07-06 MED ORDER — POLYETHYLENE GLYCOL 3350 17 G PO PACK
17.00 | PACK | ORAL | Status: DC
Start: ? — End: 2017-07-06

## 2017-07-06 MED ORDER — ALBUTEROL SULFATE HFA 108 (90 BASE) MCG/ACT IN AERS
2.00 | INHALATION_SPRAY | RESPIRATORY_TRACT | Status: DC
Start: ? — End: 2017-07-06

## 2017-07-06 MED ORDER — NITROGLYCERIN 0.4 MG SL SUBL
0.40 | SUBLINGUAL_TABLET | SUBLINGUAL | Status: DC
Start: ? — End: 2017-07-06

## 2017-07-06 MED ORDER — ASPIRIN EC 81 MG PO TBEC
81.00 | DELAYED_RELEASE_TABLET | ORAL | Status: DC
Start: 2017-07-07 — End: 2017-07-06

## 2017-07-06 MED ORDER — LIDOCAINE HCL 1 % IJ SOLN
0.50 | INTRAMUSCULAR | Status: DC
Start: ? — End: 2017-07-06

## 2017-07-06 MED ORDER — GLUCAGON HCL RDNA (DIAGNOSTIC) 1 MG IJ SOLR
1.00 | INTRAMUSCULAR | Status: DC
Start: ? — End: 2017-07-06

## 2017-07-06 MED ORDER — GENERIC EXTERNAL MEDICATION
6.25 | Status: DC
Start: 2017-07-06 — End: 2017-07-06

## 2017-07-06 MED ORDER — DEXTROSE 50 % IV SOLN
12.50 | INTRAVENOUS | Status: DC
Start: ? — End: 2017-07-06

## 2017-07-06 MED ORDER — FLUTICASONE PROPIONATE 50 MCG/ACT NA SUSP
1.00 | NASAL | Status: DC
Start: ? — End: 2017-07-06

## 2017-07-06 MED ORDER — OXYCODONE HCL 5 MG PO TABS
5.00 | ORAL_TABLET | ORAL | Status: DC
Start: ? — End: 2017-07-06

## 2017-07-06 MED ORDER — CITALOPRAM HYDROBROMIDE 20 MG PO TABS
20.00 | ORAL_TABLET | ORAL | Status: DC
Start: 2017-07-07 — End: 2017-07-06

## 2017-07-06 MED ORDER — PANTOPRAZOLE SODIUM 40 MG PO TBEC
40.00 | DELAYED_RELEASE_TABLET | ORAL | Status: DC
Start: 2017-07-07 — End: 2017-07-06

## 2017-07-06 MED ORDER — ALPRAZOLAM 0.25 MG PO TABS
.13 | ORAL_TABLET | ORAL | Status: DC
Start: ? — End: 2017-07-06

## 2017-07-06 MED ORDER — HEPARIN (PORCINE) IN NACL 100-0.45 UNIT/ML-% IJ SOLN
1000.00 | INTRAMUSCULAR | Status: DC
Start: ? — End: 2017-07-06

## 2017-07-06 MED ORDER — SENNOSIDES-DOCUSATE SODIUM 8.6-50 MG PO TABS
1.00 | ORAL_TABLET | ORAL | Status: DC
Start: 2017-07-06 — End: 2017-07-06

## 2017-07-06 MED ORDER — SUCRALFATE 1 G PO TABS
1.00 | ORAL_TABLET | ORAL | Status: DC
Start: 2017-07-06 — End: 2017-07-06

## 2017-07-06 MED ORDER — ONDANSETRON HCL 4 MG/2ML IJ SOLN
4.00 | INTRAMUSCULAR | Status: DC
Start: ? — End: 2017-07-06

## 2017-07-07 DIAGNOSIS — I771 Stricture of artery: Secondary | ICD-10-CM | POA: Insufficient documentation

## 2017-07-09 DIAGNOSIS — Z951 Presence of aortocoronary bypass graft: Secondary | ICD-10-CM | POA: Insufficient documentation

## 2017-07-09 DIAGNOSIS — I4891 Unspecified atrial fibrillation: Secondary | ICD-10-CM | POA: Insufficient documentation

## 2017-07-11 MED ORDER — OLANZAPINE 2.5 MG PO TABS
2.50 | ORAL_TABLET | ORAL | Status: DC
Start: ? — End: 2017-07-11

## 2017-07-11 MED ORDER — GENERIC EXTERNAL MEDICATION
125.00 | Status: DC
Start: 2017-07-12 — End: 2017-07-11

## 2017-07-11 MED ORDER — IPRATROPIUM-ALBUTEROL 0.5-2.5 (3) MG/3ML IN SOLN
3.00 | RESPIRATORY_TRACT | Status: DC
Start: ? — End: 2017-07-11

## 2017-07-11 MED ORDER — OXYCODONE HCL 5 MG/5ML PO SOLN
5.00 | ORAL | Status: DC
Start: ? — End: 2017-07-11

## 2017-07-11 MED ORDER — GENERIC EXTERNAL MEDICATION
25.00 | Status: DC
Start: 2017-07-11 — End: 2017-07-11

## 2017-07-11 MED ORDER — GENERIC EXTERNAL MEDICATION
20.00 | Status: DC
Start: 2017-07-12 — End: 2017-07-11

## 2017-07-11 MED ORDER — MELATONIN 3 MG PO TBDP
3.00 | ORAL_TABLET | ORAL | Status: DC
Start: 2017-07-15 — End: 2017-07-11

## 2017-07-11 MED ORDER — ASPIRIN 81 MG PO CHEW
81.00 | CHEWABLE_TABLET | ORAL | Status: DC
Start: 2017-07-12 — End: 2017-07-11

## 2017-07-11 MED ORDER — CITALOPRAM HYDROBROMIDE 20 MG PO TABS
20.00 | ORAL_TABLET | ORAL | Status: DC
Start: 2017-07-12 — End: 2017-07-11

## 2017-07-11 MED ORDER — FUROSEMIDE 10 MG/ML IJ SOLN
40.00 | INTRAMUSCULAR | Status: DC
Start: 2017-07-15 — End: 2017-07-11

## 2017-07-11 MED ORDER — ACETAMINOPHEN 160 MG/5ML PO SUSP
650.00 | ORAL | Status: DC
Start: ? — End: 2017-07-11

## 2017-07-11 MED ORDER — ISOSORBIDE DINITRATE 10 MG PO TABS
5.00 | ORAL_TABLET | ORAL | Status: DC
Start: 2017-07-11 — End: 2017-07-11

## 2017-07-11 MED ORDER — GLUCAGON HCL RDNA (DIAGNOSTIC) 1 MG IJ SOLR
1.00 | INTRAMUSCULAR | Status: DC
Start: ? — End: 2017-07-11

## 2017-07-11 MED ORDER — INSULIN REGULAR HUMAN 100 UNIT/ML IJ SOLN
0.00 | INTRAMUSCULAR | Status: DC
Start: 2017-07-11 — End: 2017-07-11

## 2017-07-11 MED ORDER — BISACODYL 10 MG RE SUPP
10.00 | RECTAL | Status: DC
Start: ? — End: 2017-07-11

## 2017-07-11 MED ORDER — GENERIC EXTERNAL MEDICATION
0.50 | Status: DC
Start: ? — End: 2017-07-11

## 2017-07-11 MED ORDER — DEXTROSE 10 % IV SOLN
50.00 | INTRAVENOUS | Status: DC
Start: ? — End: 2017-07-11

## 2017-07-11 MED ORDER — DEXTROSE 50 % IV SOLN
12.50 | INTRAVENOUS | Status: DC
Start: ? — End: 2017-07-11

## 2017-07-11 MED ORDER — SENNA 8.8 MG/5ML PO SYRP
5.00 | ORAL_SOLUTION | ORAL | Status: DC
Start: 2017-07-11 — End: 2017-07-11

## 2017-07-11 MED ORDER — INSULIN REGULAR HUMAN 100 UNIT/ML IJ SOLN
4.00 | INTRAMUSCULAR | Status: DC
Start: 2017-07-11 — End: 2017-07-11

## 2017-07-11 MED ORDER — LIDOCAINE 5 % EX PTCH
1.00 | MEDICATED_PATCH | CUTANEOUS | Status: DC
Start: 2017-07-15 — End: 2017-07-11

## 2017-07-15 MED ORDER — PANTOPRAZOLE SODIUM 40 MG PO TBEC
40.00 | DELAYED_RELEASE_TABLET | ORAL | Status: DC
Start: 2017-07-15 — End: 2017-07-15

## 2017-07-15 MED ORDER — OXYCODONE HCL 5 MG PO TABS
5.00 | ORAL_TABLET | ORAL | Status: DC
Start: ? — End: 2017-07-15

## 2017-07-15 MED ORDER — POLYETHYLENE GLYCOL 3350 17 G PO PACK
17.00 | PACK | ORAL | Status: DC
Start: ? — End: 2017-07-15

## 2017-07-15 MED ORDER — VENLAFAXINE HCL ER 75 MG PO CP24
75.00 | ORAL_CAPSULE | ORAL | Status: DC
Start: 2017-07-15 — End: 2017-07-15

## 2017-07-15 MED ORDER — ISOSORBIDE DINITRATE 10 MG PO TABS
5.00 | ORAL_TABLET | ORAL | Status: DC
Start: 2017-07-15 — End: 2017-07-15

## 2017-07-15 MED ORDER — APIXABAN 5 MG PO TABS
5.00 | ORAL_TABLET | ORAL | Status: DC
Start: 2017-07-15 — End: 2017-07-15

## 2017-07-15 MED ORDER — LEVOTHYROXINE SODIUM 125 MCG PO TABS
125.00 | ORAL_TABLET | ORAL | Status: DC
Start: 2017-07-16 — End: 2017-07-15

## 2017-07-15 MED ORDER — INSULIN REGULAR HUMAN 100 UNIT/ML IJ SOLN
4.00 | INTRAMUSCULAR | Status: DC
Start: 2017-07-15 — End: 2017-07-15

## 2017-07-15 MED ORDER — ASPIRIN EC 81 MG PO TBEC
81.00 | DELAYED_RELEASE_TABLET | ORAL | Status: DC
Start: 2017-07-15 — End: 2017-07-15

## 2017-07-15 MED ORDER — ACETAMINOPHEN 325 MG PO TABS
650.00 | ORAL_TABLET | ORAL | Status: DC
Start: ? — End: 2017-07-15

## 2017-07-15 MED ORDER — METOPROLOL TARTRATE 25 MG PO TABS
25.00 | ORAL_TABLET | ORAL | Status: DC
Start: 2017-07-15 — End: 2017-07-15

## 2017-07-15 MED ORDER — VANCOMYCIN HCL 50 MG/ML PO SOLR
125.00 | ORAL | Status: DC
Start: 2017-07-15 — End: 2017-07-15

## 2017-07-15 MED ORDER — GENERIC EXTERNAL MEDICATION
Status: DC
Start: ? — End: 2017-07-15

## 2017-07-15 MED ORDER — ONDANSETRON HCL 4 MG/2ML IJ SOLN
4.00 | INTRAMUSCULAR | Status: DC
Start: ? — End: 2017-07-15

## 2017-07-15 MED ORDER — SENNOSIDES-DOCUSATE SODIUM 8.6-50 MG PO TABS
2.00 | ORAL_TABLET | ORAL | Status: DC
Start: 2017-07-15 — End: 2017-07-15

## 2017-07-15 MED ORDER — INSULIN REGULAR HUMAN 100 UNIT/ML IJ SOLN
0.00 | INTRAMUSCULAR | Status: DC
Start: 2017-07-15 — End: 2017-07-15

## 2017-08-12 ENCOUNTER — Other Ambulatory Visit: Payer: Self-pay | Admitting: Family Medicine

## 2017-08-12 DIAGNOSIS — Z1239 Encounter for other screening for malignant neoplasm of breast: Secondary | ICD-10-CM

## 2017-09-01 ENCOUNTER — Ambulatory Visit
Admission: RE | Admit: 2017-09-01 | Discharge: 2017-09-01 | Disposition: A | Discharge status: Home / Self Care | Payer: Medicare Other | Source: Ambulatory Visit | Attending: Family Medicine | Admitting: Family Medicine

## 2017-09-01 DIAGNOSIS — Z1231 Encounter for screening mammogram for malignant neoplasm of breast: Secondary | ICD-10-CM | POA: Insufficient documentation

## 2017-09-01 DIAGNOSIS — Z1239 Encounter for other screening for malignant neoplasm of breast: Secondary | ICD-10-CM

## 2017-09-08 ENCOUNTER — Other Ambulatory Visit: Payer: Self-pay | Admitting: Gastroenterology

## 2017-09-08 DIAGNOSIS — R1312 Dysphagia, oropharyngeal phase: Secondary | ICD-10-CM

## 2017-09-08 DIAGNOSIS — R110 Nausea: Secondary | ICD-10-CM

## 2017-09-11 ENCOUNTER — Other Ambulatory Visit
Admission: RE | Admit: 2017-09-11 | Discharge: 2017-09-11 | Disposition: A | Discharge status: Home / Self Care | Payer: Medicare Other | Source: Skilled Nursing Facility | Attending: Gastroenterology | Admitting: Gastroenterology

## 2017-09-11 DIAGNOSIS — R197 Diarrhea, unspecified: Secondary | ICD-10-CM | POA: Insufficient documentation

## 2017-09-11 LAB — CLOSTRIDIUM DIFFICILE BY PCR, REFLEXED: Toxigenic C. Difficile by PCR: POSITIVE — AB

## 2017-09-11 LAB — GASTROINTESTINAL PANEL BY PCR, STOOL (REPLACES STOOL CULTURE)
ASTROVIRUS: NOT DETECTED
Adenovirus F40/41: NOT DETECTED
CAMPYLOBACTER SPECIES: NOT DETECTED
CRYPTOSPORIDIUM: NOT DETECTED
CYCLOSPORA CAYETANENSIS: NOT DETECTED
ENTEROAGGREGATIVE E COLI (EAEC): NOT DETECTED
ENTEROPATHOGENIC E COLI (EPEC): NOT DETECTED
ENTEROTOXIGENIC E COLI (ETEC): NOT DETECTED
Entamoeba histolytica: NOT DETECTED
GIARDIA LAMBLIA: NOT DETECTED
Norovirus GI/GII: NOT DETECTED
PLESIMONAS SHIGELLOIDES: NOT DETECTED
Rotavirus A: NOT DETECTED
Salmonella species: NOT DETECTED
Sapovirus (I, II, IV, and V): NOT DETECTED
Shiga like toxin producing E coli (STEC): NOT DETECTED
Shigella/Enteroinvasive E coli (EIEC): NOT DETECTED
VIBRIO SPECIES: NOT DETECTED
Vibrio cholerae: NOT DETECTED
YERSINIA ENTEROCOLITICA: NOT DETECTED

## 2017-09-11 LAB — C DIFFICILE QUICK SCREEN W PCR REFLEX
C DIFFICILE (CDIFF) TOXIN: NEGATIVE
C DIFFICLE (CDIFF) ANTIGEN: POSITIVE — AB

## 2017-09-15 ENCOUNTER — Ambulatory Visit: Payer: Medicare Other

## 2017-09-18 ENCOUNTER — Ambulatory Visit
Admission: RE | Admit: 2017-09-18 | Discharge: 2017-09-18 | Disposition: A | Discharge status: Home / Self Care | Payer: Medicare Other | Source: Ambulatory Visit | Attending: Gastroenterology | Admitting: Gastroenterology

## 2017-09-18 DIAGNOSIS — R110 Nausea: Secondary | ICD-10-CM

## 2017-09-18 DIAGNOSIS — K449 Diaphragmatic hernia without obstruction or gangrene: Secondary | ICD-10-CM | POA: Diagnosis not present

## 2017-09-18 DIAGNOSIS — K219 Gastro-esophageal reflux disease without esophagitis: Secondary | ICD-10-CM | POA: Insufficient documentation

## 2017-09-18 DIAGNOSIS — R197 Diarrhea, unspecified: Secondary | ICD-10-CM | POA: Diagnosis present

## 2017-09-18 DIAGNOSIS — K2970 Gastritis, unspecified, without bleeding: Secondary | ICD-10-CM | POA: Diagnosis not present

## 2017-09-18 DIAGNOSIS — R1312 Dysphagia, oropharyngeal phase: Secondary | ICD-10-CM

## 2017-11-24 ENCOUNTER — Other Ambulatory Visit
Admission: RE | Admit: 2017-11-24 | Discharge: 2017-11-24 | Disposition: A | Discharge status: Home / Self Care | Payer: Medicare Other | Source: Ambulatory Visit | Attending: Gastroenterology | Admitting: Gastroenterology

## 2017-11-24 DIAGNOSIS — R197 Diarrhea, unspecified: Secondary | ICD-10-CM | POA: Diagnosis present

## 2017-11-24 LAB — GASTROINTESTINAL PANEL BY PCR, STOOL (REPLACES STOOL CULTURE)

## 2017-11-24 LAB — C DIFFICILE QUICK SCREEN W PCR REFLEX
C DIFFICLE (CDIFF) ANTIGEN: POSITIVE — AB
C Diff toxin: NEGATIVE

## 2017-11-24 LAB — CLOSTRIDIUM DIFFICILE BY PCR, REFLEXED: Toxigenic C. Difficile by PCR: POSITIVE — AB

## 2017-11-26 LAB — H. PYLORI ANTIGEN, STOOL: H. Pylori Stool Ag, Eia: NEGATIVE

## 2017-12-30 ENCOUNTER — Ambulatory Visit (INDEPENDENT_AMBULATORY_CARE_PROVIDER_SITE_OTHER): Payer: Medicare Other | Admitting: Vascular Surgery

## 2017-12-30 ENCOUNTER — Ambulatory Visit (INDEPENDENT_AMBULATORY_CARE_PROVIDER_SITE_OTHER): Payer: Medicare Other

## 2017-12-30 ENCOUNTER — Encounter (INDEPENDENT_AMBULATORY_CARE_PROVIDER_SITE_OTHER): Payer: Self-pay | Admitting: Vascular Surgery

## 2017-12-30 ENCOUNTER — Encounter (INDEPENDENT_AMBULATORY_CARE_PROVIDER_SITE_OTHER): Payer: Self-pay

## 2017-12-30 VITALS — BP 145/70 | HR 73 | Resp 16 | Ht 63.0 in | Wt 140.8 lb

## 2017-12-30 DIAGNOSIS — I2510 Atherosclerotic heart disease of native coronary artery without angina pectoris: Secondary | ICD-10-CM | POA: Diagnosis not present

## 2017-12-30 DIAGNOSIS — I6523 Occlusion and stenosis of bilateral carotid arteries: Secondary | ICD-10-CM

## 2017-12-30 DIAGNOSIS — I739 Peripheral vascular disease, unspecified: Secondary | ICD-10-CM | POA: Diagnosis not present

## 2017-12-30 DIAGNOSIS — E118 Type 2 diabetes mellitus with unspecified complications: Secondary | ICD-10-CM

## 2017-12-30 DIAGNOSIS — M79602 Pain in left arm: Secondary | ICD-10-CM

## 2017-12-30 DIAGNOSIS — E785 Hyperlipidemia, unspecified: Secondary | ICD-10-CM | POA: Diagnosis not present

## 2017-12-30 DIAGNOSIS — M79601 Pain in right arm: Secondary | ICD-10-CM | POA: Diagnosis not present

## 2017-12-30 DIAGNOSIS — I70219 Atherosclerosis of native arteries of extremities with intermittent claudication, unspecified extremity: Secondary | ICD-10-CM | POA: Diagnosis not present

## 2017-12-30 NOTE — Progress Notes (Signed)
Subjective:    Patient ID: Christine Obrien, female    DOB: 1940/10/05, 77 y.o.   MRN: [id number] Chief Complaint  Patient presents with  . Follow-up    abi ultrasound   Patient presents at her request for progressively worsening bilateral lower extremity discomfort.  The patient notes that this discomfort occurs with ambulation.  The patient notes a progressively worsening calf pain.  The patient notes a shortening in her claudication distance.  The patient feels that her symptoms have progressed to the point that she is unable to function on a daily basis and they have become lifestyle limiting.  The patient denies any rest pain or ulcer formation to the bilateral lower extremity.  The patient notes that her left lower extremity is more painful than her right.  The patient underwent a bilateral ABI which was notable for Right: 0.74, biphasic tibials. Left: .50, biphasic tibials.  Of note, patient underwent an open cardiac bypass July 08, 2017.  The patient is also experiencing upper extremity pain.  The patient was told by her cardiologist that she has "blockages" in her upper extremity.  The patient denies any ulcer formation to the bilateral upper extremity, hands or fingers at this time. The patient denies any fever, nausea vomiting.  Review of Systems  Constitutional: Negative.   HENT: Negative.   Eyes: Negative.   Respiratory: Negative.   Cardiovascular:       Lower extremity claudication Upper extremity pain Carotid stenosis CAD  Gastrointestinal: Negative.   Endocrine: Negative.   Genitourinary: Negative.   Musculoskeletal: Negative.   Skin: Negative.   Allergic/Immunologic: Negative.   Neurological: Negative.   Hematological: Negative.   Psychiatric/Behavioral: Negative.       Objective:   Physical Exam  Constitutional: She is oriented to person, place, and time. She appears well-developed and well-nourished. No distress.  HENT:  Head: Normocephalic and atraumatic.  Right  Ear: External ear normal.  Left Ear: External ear normal.  Eyes: Pupils are equal, round, and reactive to light. Conjunctivae and EOM are normal.  Neck: Normal range of motion.  Bilateral carotid bruits noted  Cardiovascular: Normal rate, regular rhythm, normal heart sounds and intact distal pulses.  Pulses:      Radial pulses are 1+ on the right side, and 1+ on the left side.  Lower extremity: faint pedal pulses on exam.  Sluggish capillary refill. Upper extremity: Faint radial pulses, good capillary refill  Pulmonary/Chest: Effort normal and breath sounds normal.  Musculoskeletal: Normal range of motion.  Neurological: She is alert and oriented to person, place, and time.  Skin: Skin is warm and dry. She is not diaphoretic.  Psychiatric: She has a normal mood and affect. Her behavior is normal. Thought content normal.  Vitals reviewed.  BP (!) 145/70 (BP Location: Right Arm)   Pulse 73   Resp 16   Ht 5\' 3"  (1.6 m)   Wt 140 lb 12.8 oz (63.9 kg)   BMI 24.94 kg/m   Past Medical History:  Diagnosis Date  . Carotid artery occlusion   . Hypertension   . Myocardial infarction [facility])    Social History   Socioeconomic History  . Marital status: Widowed    Spouse name: Not on file  . Number of children: Not on file  . Years of education: Not on file  . Highest education level: Not on file  Occupational History  . Not on file  Social Needs  . Financial resource strain: Not on file  .  Food insecurity:    Worry: Not on file    Inability: Not on file  . Transportation needs:    Medical: Not on file    Non-medical: Not on file  Tobacco Use  . Smoking status: Current Every Day Smoker  . Smokeless tobacco: Never Used  Substance and Sexual Activity  . Alcohol use: No  . Drug use: No  . Sexual activity: Not on file  Lifestyle  . Physical activity:    Days per week: Not on file    Minutes per session: Not on file  . Stress: Not on file  Relationships  . Social connections:      Talks on phone: Not on file    Gets together: Not on file    Attends religious service: Not on file    Active member of club or organization: Not on file    Attends meetings of clubs or organizations: Not on file    Relationship status: Not on file  . Intimate partner violence:    Fear of current or ex partner: Not on file    Emotionally abused: Not on file    Physically abused: Not on file    Forced sexual activity: Not on file  Other Topics Concern  . Not on file  Social History Narrative  . Not on file   Past Surgical History:  Procedure Laterality Date  . CHOLECYSTECTOMY    . LEFT HEART CATH AND CORONARY ANGIOGRAPHY N/A 07/03/2017   Procedure: LEFT HEART CATH AND CORONARY ANGIOGRAPHY;  Surgeon: Corey Skains, MD;  Location: [facility];  Service: Cardiovascular;  Laterality: N/A;   Family History  Problem Relation Age of Onset  . Hypertension Mother   . Hypertension Father   . Diabetes Sister   . Cancer Brother   . Diabetes Brother    Allergies  Allergen Reactions  . Ace Inhibitors     Other reaction(s): Cough  . Atorvastatin     Other reaction(s): Muscle Pain  . Statins     Other reaction(s): Muscle Pain Muscle pain  . Sulfa Antibiotics     Other reaction(s): Unknown      Assessment & Plan:  Patient presents at her request for progressively worsening bilateral lower extremity discomfort.  The patient notes that this discomfort occurs with ambulation.  The patient notes a progressively worsening calf pain.  The patient notes a shortening in her claudication distance.  The patient feels that her symptoms have progressed to the point that she is unable to function on a daily basis and they have become lifestyle limiting.  The patient denies any rest pain or ulcer formation to the bilateral lower extremity.  The patient notes that her left lower extremity is more painful than her right.  The patient underwent a bilateral ABI which was notable for Right:  0.74, biphasic tibials. Left: .50, biphasic tibials.  Of note, patient underwent an open cardiac bypass July 08, 2017.  The patient is also experiencing upper extremity pain.  The patient was told by her cardiologist that she has "blockages" in her upper extremity.  The patient denies any ulcer formation to the bilateral upper extremity, hands or fingers at this time.  The patient denies any fever, nausea vomiting.  1. Atherosclerotic peripheral vascular disease with intermittent claudication (HCC) - Stable Patient presents with worsening claudication-like symptoms to the bilateral calves Left lower extremity is worse when compared to right ABI with moderate to severe disease to the left lower extremity  and moderate disease to the right lower extremity The patient symptoms have progressed to the point that she is unable to function on a daily basis and they have become lifestyle limiting Recommend a left lower extremity angiogram with possible intervention followed by right lower extremity angiogram with possible intervention to assess the patient's anatomy and degree of contributing peripheral artery disease.  If appropriate, at that time an attempt to revascularize the leg can be made. Procedure, risks and benefits explained to the patient All questions answered The patient wishes to proceed Patient's daughter was present during the examination and visit.  2. Pain in both upper extremities - New Patient was told by her cardiologist that she has "blockages" in her bilateral upper extremities. The patient does experience pain to the bilateral upper arm and hands. However the patient back and have her undergo a bilateral upper extremity arterial duplex to assess her anatomy and contributing degree of peripheral artery disease.  - VAS [place] UPPER EXTREMITY ARTERIAL DUPLEX; Future  3. Bilateral carotid artery stenosis - Stable This is followed on a regular basis. Patient is currently  asymptomatic  4. Hyperlipidemia, unspecified hyperlipidemia type - Stable Encouraged good control as its slows the progression of atherosclerotic disease  5. Type 2 diabetes mellitus with complication, unspecified whether long term insulin use (HCC) - Stable Encouraged good control as its slows the progression of atherosclerotic disease  6. Coronary artery disease involving native heart, angina presence unspecified, unspecified vessel or lesion type - Stable Patient is status post an open cardiac bypass on July 18, 2017 @ [facility]  Current Outpatient Medications on File Prior to Visit  Medication Sig Dispense Refill  . ascorbic acid (VITAMIN C) 1000 MG tablet Take by mouth.    Marland Kitchen aspirin EC 81 MG tablet Take 81 mg by mouth.    . ezetimibe (ZETIA) 10 MG tablet Take by mouth.    Marland Kitchen FREESTYLE LITE test strip TEST TID UTD  10  . levothyroxine (SYNTHROID, LEVOTHROID) 150 MCG tablet   1  . metoprolol tartrate (LOPRESSOR) 25 MG tablet Take 1 tablet (25 mg total) by mouth 2 (two) times daily. 60 tablet 0  . Multiple Vitamin (MULTIVITAMIN) capsule Take 1 capsule by mouth daily.    Marland Kitchen omeprazole (PRILOSEC) 20 MG capsule Take 20 mg by mouth daily.     . Probiotic Product (PROBIOTIC DAILY PO) Take by mouth daily.    Marland Kitchen venlafaxine (EFFEXOR) 75 MG tablet Take 75 mg by mouth 3 (three) times daily with meals.    Marland Kitchen acetaminophen (TYLENOL) 325 MG tablet Tylenol    . ALPRAZolam (XANAX) 0.5 MG tablet Take 0.5 mg by mouth at bedtime.     . citalopram (CELEXA) 20 MG tablet AT THE START OF THERAPY, TAKE ONE-HALF (1/2) TABLET DAILY, CAN INCREASE TO 1 TABLET DAILY THEREAFTER IF NEEDED AFTER 2 TO 3 WEEKS    . ferrous sulfate 325 (65 FE) MG tablet Take 325 mg by mouth.   0  . fluticasone (FLONASE) 50 MCG/ACT nasal spray instill 2 sprays into each nostril once daily    . heparin 100-0.45 UNIT/ML-% infusion Inject 900 Units/hr into the vein continuous. (Patient not taking: Reported on 12/30/2017) 250 mL 0  .  HYDROcodone-acetaminophen (NORCO) 5-325 MG tablet Take 1 tablet by mouth every 6 (six) hours as needed for up to 7 doses for severe pain. (Patient not taking: Reported on 12/30/2017) 7 tablet 0  . LEVOXYL 112 MCG tablet Take 112 mcg by mouth daily before breakfast.     .  meloxicam (MOBIC) 15 MG tablet Take 15 mg by mouth daily.    . methocarbamol (ROBAXIN) 500 MG tablet Take 500 mg by mouth every morning.   0  . nitroGLYCERIN (NITROSTAT) 0.4 MG SL tablet 1 tablet(s) sublingual as needed for chest pain (may repeat every 5 minutes but seek medical help if pain persists after 3 tablets)    . ondansetron (ZOFRAN) 8 MG tablet Take 8 mg by mouth.    . sucralfate (CARAFATE) 1 g tablet Take 1 g by mouth daily.     . traMADol (ULTRAM) 50 MG tablet Take 50 mg by mouth daily.      No current facility-administered medications on file prior to visit.    There are no Patient Instructions on file for this visit. No follow-ups on file.  Motty Borin A Kairy Folsom, PA-C

## 2018-01-08 ENCOUNTER — Other Ambulatory Visit (INDEPENDENT_AMBULATORY_CARE_PROVIDER_SITE_OTHER): Payer: Self-pay | Admitting: Vascular Surgery

## 2018-01-10 MED ORDER — CEFAZOLIN SODIUM-DEXTROSE 2-4 GM/100ML-% IV SOLN: 2.0000 g | Freq: Once | INTRAVENOUS | Status: AC

## 2018-01-11 ENCOUNTER — Ambulatory Visit
Admission: RE | Admit: 2018-01-11 | Discharge: 2018-01-11 | Disposition: A | Discharge status: Home / Self Care | Payer: Medicare Other | Source: Ambulatory Visit | Attending: Vascular Surgery | Admitting: Vascular Surgery

## 2018-01-11 ENCOUNTER — Encounter
Admission: RE | Disposition: A | Discharge status: Home / Self Care | Payer: Self-pay | Source: Ambulatory Visit | Attending: Vascular Surgery

## 2018-01-11 DIAGNOSIS — I10 Essential (primary) hypertension: Secondary | ICD-10-CM | POA: Insufficient documentation

## 2018-01-11 DIAGNOSIS — Z955 Presence of coronary angioplasty implant and graft: Secondary | ICD-10-CM | POA: Diagnosis not present

## 2018-01-11 DIAGNOSIS — Z7951 Long term (current) use of inhaled steroids: Secondary | ICD-10-CM | POA: Insufficient documentation

## 2018-01-11 DIAGNOSIS — E118 Type 2 diabetes mellitus with unspecified complications: Secondary | ICD-10-CM | POA: Diagnosis not present

## 2018-01-11 DIAGNOSIS — Z79899 Other long term (current) drug therapy: Secondary | ICD-10-CM | POA: Diagnosis not present

## 2018-01-11 DIAGNOSIS — I6523 Occlusion and stenosis of bilateral carotid arteries: Secondary | ICD-10-CM | POA: Insufficient documentation

## 2018-01-11 DIAGNOSIS — I252 Old myocardial infarction: Secondary | ICD-10-CM | POA: Insufficient documentation

## 2018-01-11 DIAGNOSIS — Z79890 Hormone replacement therapy (postmenopausal): Secondary | ICD-10-CM | POA: Insufficient documentation

## 2018-01-11 DIAGNOSIS — Z9049 Acquired absence of other specified parts of digestive tract: Secondary | ICD-10-CM | POA: Diagnosis not present

## 2018-01-11 DIAGNOSIS — Z888 Allergy status to other drugs, medicaments and biological substances status: Secondary | ICD-10-CM | POA: Insufficient documentation

## 2018-01-11 DIAGNOSIS — Z8249 Family history of ischemic heart disease and other diseases of the circulatory system: Secondary | ICD-10-CM | POA: Diagnosis not present

## 2018-01-11 DIAGNOSIS — F17200 Nicotine dependence, unspecified, uncomplicated: Secondary | ICD-10-CM | POA: Insufficient documentation

## 2018-01-11 DIAGNOSIS — Z7982 Long term (current) use of aspirin: Secondary | ICD-10-CM | POA: Diagnosis not present

## 2018-01-11 DIAGNOSIS — I70213 Atherosclerosis of native arteries of extremities with intermittent claudication, bilateral legs: Secondary | ICD-10-CM | POA: Insufficient documentation

## 2018-01-11 DIAGNOSIS — I70219 Atherosclerosis of native arteries of extremities with intermittent claudication, unspecified extremity: Secondary | ICD-10-CM

## 2018-01-11 DIAGNOSIS — E785 Hyperlipidemia, unspecified: Secondary | ICD-10-CM | POA: Insufficient documentation

## 2018-01-11 HISTORY — DX: Type 2 diabetes mellitus without complications: E11.9

## 2018-01-11 HISTORY — PX: LOWER EXTREMITY ANGIOGRAPHY: CATH118251

## 2018-01-11 LAB — CREATININE, SERUM: CREATININE: 0.87 mg/dL (ref 0.44–1.00)

## 2018-01-11 LAB — GLUCOSE, CAPILLARY: GLUCOSE-CAPILLARY: 137 mg/dL — AB (ref 70–99)

## 2018-01-11 LAB — POTASSIUM: Potassium: 4.6 mmol/L (ref 3.5–5.1)

## 2018-01-11 LAB — BUN: BUN: 16 mg/dL (ref 8–23)

## 2018-01-11 SURGERY — LOWER EXTREMITY ANGIOGRAPHY
Anesthesia: Moderate Sedation | Laterality: Left

## 2018-01-11 MED ORDER — SODIUM CHLORIDE 0.9 % IV SOLN
INTRAVENOUS | Status: DC
  Administered 2018-01-11: 14:00:00 via INTRAVENOUS

## 2018-01-11 MED ORDER — CLOPIDOGREL BISULFATE 75 MG PO TABS: 75.0000 mg | ORAL_TABLET | Freq: Every day | ORAL | Status: DC

## 2018-01-11 MED ORDER — FENTANYL CITRATE (PF) 100 MCG/2ML IJ SOLN
INTRAMUSCULAR | Status: DC | PRN
  Administered 2018-01-11 (×2): 50 ug via INTRAVENOUS

## 2018-01-11 MED ORDER — SODIUM CHLORIDE 0.9 % IV SOLN: INTRAVENOUS | Status: DC

## 2018-01-11 MED ORDER — ACETAMINOPHEN 325 MG PO TABS: 650.0000 mg | ORAL_TABLET | ORAL | Status: DC | PRN

## 2018-01-11 MED ORDER — LABETALOL HCL 5 MG/ML IV SOLN: 10.0000 mg | INTRAVENOUS | Status: DC | PRN

## 2018-01-11 MED ORDER — HYDROMORPHONE HCL 1 MG/ML IJ SOLN: 1.0000 mg | Freq: Once | INTRAMUSCULAR | Status: DC | PRN

## 2018-01-11 MED ORDER — CEFAZOLIN SODIUM-DEXTROSE 2-4 GM/100ML-% IV SOLN
INTRAVENOUS | Status: AC
  Administered 2018-01-11: 2 g
  Filled 2018-01-11: qty 100

## 2018-01-11 MED ORDER — ONDANSETRON HCL 4 MG/2ML IJ SOLN: 4.0000 mg | Freq: Four times a day (QID) | INTRAMUSCULAR | Status: DC | PRN

## 2018-01-11 MED ORDER — HYDRALAZINE HCL 20 MG/ML IJ SOLN: 5.0000 mg | INTRAMUSCULAR | Status: DC | PRN

## 2018-01-11 MED ORDER — HEPARIN SODIUM (PORCINE) 1000 UNIT/ML IJ SOLN
INTRAMUSCULAR | Status: DC | PRN
  Administered 2018-01-11: 4000 [IU] via INTRAVENOUS

## 2018-01-11 MED ORDER — HEPARIN SODIUM (PORCINE) 1000 UNIT/ML IJ SOLN
INTRAMUSCULAR | Status: AC
  Filled 2018-01-11: qty 1

## 2018-01-11 MED ORDER — SODIUM CHLORIDE 0.9 % IV SOLN: 250.0000 mL | INTRAVENOUS | Status: DC | PRN

## 2018-01-11 MED ORDER — FENTANYL CITRATE (PF) 100 MCG/2ML IJ SOLN
INTRAMUSCULAR | Status: AC
  Filled 2018-01-11: qty 2

## 2018-01-11 MED ORDER — METHYLPREDNISOLONE SODIUM SUCC 125 MG IJ SOLR: 125.0000 mg | INTRAMUSCULAR | Status: DC | PRN

## 2018-01-11 MED ORDER — FAMOTIDINE 20 MG PO TABS: 40.0000 mg | ORAL_TABLET | ORAL | Status: DC | PRN

## 2018-01-11 MED ORDER — MIDAZOLAM HCL 2 MG/2ML IJ SOLN
INTRAMUSCULAR | Status: DC | PRN
  Administered 2018-01-11: 2 mg via INTRAVENOUS

## 2018-01-11 MED ORDER — CLOPIDOGREL BISULFATE 75 MG PO TABS: 75.0000 mg | ORAL_TABLET | Freq: Every day | ORAL | 11 refills | Status: DC

## 2018-01-11 MED ORDER — IOPAMIDOL (ISOVUE-300) INJECTION 61%
INTRAVENOUS | Status: DC | PRN
  Administered 2018-01-11: 80 mL via INTRAVENOUS

## 2018-01-11 MED ORDER — SODIUM CHLORIDE 0.9% FLUSH: 3.0000 mL | INTRAVENOUS | Status: DC | PRN

## 2018-01-11 MED ORDER — SODIUM CHLORIDE 0.9% FLUSH: 3.0000 mL | Freq: Two times a day (BID) | INTRAVENOUS | Status: DC

## 2018-01-11 MED ORDER — MIDAZOLAM HCL 2 MG/2ML IJ SOLN
INTRAMUSCULAR | Status: AC
  Filled 2018-01-11: qty 4

## 2018-01-11 MED ORDER — LIDOCAINE-EPINEPHRINE (PF) 1 %-1:200000 IJ SOLN
INTRAMUSCULAR | Status: AC
  Filled 2018-01-11: qty 30

## 2018-01-11 MED ORDER — HEPARIN (PORCINE) IN NACL 1000-0.9 UT/500ML-% IV SOLN
INTRAVENOUS | Status: AC
  Filled 2018-01-11: qty 1000

## 2018-01-11 SURGICAL SUPPLY — 19 items
BALLN LUTONIX AV 7X60X75 (BALLOONS) ×3
BALLN ULTRVRSE 4X100X75C (BALLOONS) ×3
BALLOON LUTONIX AV 7X60X75 (BALLOONS) ×1 IMPLANT
BALLOON ULTRVRSE 4X100X75C (BALLOONS) ×1 IMPLANT
CATH BEACON 5 .035 40 KMP TP (CATHETERS) ×1 IMPLANT
CATH BEACON 5 .035 65 RIM TIP (CATHETERS) ×3 IMPLANT
CATH BEACON 5 .038 40 KMP TP (CATHETERS) ×2
CATH PIG 70CM (CATHETERS) ×3 IMPLANT
CATH VS15FR (CATHETERS) ×3 IMPLANT
DEVICE PRESTO INFLATION (MISCELLANEOUS) ×6 IMPLANT
DEVICE STARCLOSE SE CLOSURE (Vascular Products) ×6 IMPLANT
GLIDEWIRE ADV .035X180CM (WIRE) ×6 IMPLANT
PACK ANGIOGRAPHY (CUSTOM PROCEDURE TRAY) ×3 IMPLANT
SHEATH BRITE TIP 5FRX11 (SHEATH) ×3 IMPLANT
SHEATH BRITE TIP 6FRX11 (SHEATH) ×6 IMPLANT
STENT LIFESTAR 7X60X80 (Permanent Stent) ×3 IMPLANT
STENT LIFESTREAM 7X26X80 (Permanent Stent) ×6 IMPLANT
TUBING CONTRAST HIGH PRESS 72 (TUBING) ×3 IMPLANT
WIRE J 3MM .035X145CM (WIRE) ×3 IMPLANT

## 2018-01-11 NOTE — Op Note (Signed)
[facility]  Percutaneous Study/Intervention Procedural Note   Date of Surgery: 01/11/2018  Surgeon(s):,    Assistants:none  Pre-operative Diagnosis: PAD with claudication bilateral lower extremities  Post-operative diagnosis:  Same  Procedure(s) Performed:             1.  Ultrasound guidance for vascular access bilateral femoral arteries             2.  Catheter placement into left common femoral artery from right femoral approach and catheter placement into the aorta from left femoral approach             3.  Aortogram and selective left lower extremity angiogram             4.  Percutaneous transluminal angioplasty of bilateral common iliac arteries for predilatation with a 4 mm diameter by 10 cm length Lutonix drug-coated angioplasty balloon             5.   Kissing stent placement to bilateral common iliac arteries using 7 mm diameter by 26 mm in length lifestream covered stents  6.  Self-expanding stent placement to the distal left common iliac artery and proximal external iliac artery with 7 mm diameter by 6 cm length life star stent             7.  StarClose closure device bilateral femoral artery  EBL: 10 cc  Contrast: 80 cc  Fluoro Time: 7.2 minutes  Moderate Conscious Sedation Time: approximately 45 minutes using 2 mg of Versed and 100 Mcg of Fentanyl              Indications:  Patient is a 77 y.o.female with disabling claudication symptoms worse on the left than the right but affecting both lower extremities. The patient has noninvasive study showing reduced ABIs bilaterally worse on the left than the right. The patient is brought in for angiography for further evaluation and potential treatment. Risks and benefits are discussed and informed consent is obtained.   Procedure:  The patient was identified and appropriate procedural time out was performed.  The patient was then placed supine on the table and prepped and draped in the usual  sterile fashion. Moderate conscious sedation was administered during a face to face encounter with the patient throughout the procedure with my supervision of the RN administering medicines and monitoring the patient's vital signs, pulse oximetry, telemetry and mental status throughout from the start of the procedure until the patient was taken to the recovery room. Ultrasound was used to evaluate the right common femoral artery.  It was patent but diseased with calcific atherosclerotic disease.  A digital ultrasound image was acquired.  A Seldinger needle was used to access the right common femoral artery under direct ultrasound guidance and a permanent image was performed.  A 0.035 J wire was advanced without resistance and a 5Fr sheath was placed.   Initially, the pigtail catheter and J-wire would not cross the right common iliac artery.  Imaging through the right femoral sheath demonstrated about an 80-90% stenosis of the proximal right common iliac artery.  Using a Kumpe catheter and an advantage wire, I was able to cross this stenosis and confirm intraluminal flow in the aorta.  Pigtail catheter was placed into the aorta and an AP aortogram was performed. This demonstrated that the renal arteries appear to be normal.  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  The proximal right common iliac artery had an 80 to 90%  stenosis.  The proximal left common iliac artery was completely occluded for about 1 cm.  The vessel then normalized and there was another stenosis in the 70 to 75% range in the distal left common iliac artery and proximal left external iliac artery.  Both common femoral arteries appeared to have at least mild to moderate disease.  At this point, it was clear that kissing stents would be necessary and I elected to access the left femoral artery.  This was done under ultrasound guidance without difficulty with a Seldinger needle.  A J-wire and 6 French sheath were placed and the patient was  systemically heparinized.  Initially, attempts to cross the occlusion in the left common iliac artery from the left side with a Kumpe catheter and the advantage wire were not successful.  The wire would not get into an intraluminal plane initially.  I then decided to go up and over from the right to see if I could stay luminal to cross the lesion.  I then crossed the aortic bifurcation and advanced to the left femoral head using a VS 1 catheter and the advantage wire.  This allowed me to stay in a luminal location.  We confirmed that we were intraluminal at the left common femoral artery with a VS 1 catheter and proceeded to image the left leg which was the more severely affected leg to see if infrainguinal disease was present. Selective left lower extremity angiogram was then performed. This demonstrated a small but patent left superficial femoral artery and popliteal artery without focal stenosis.  The posterior tibial artery was continuous and was the dominant runoff to the foot although it was somewhat sluggish due to the inflow disease.  There appeared to be a small peroneal artery providing a second runoff vessel distally.  I elected to go ahead and predilate both common iliac arteries with a small balloon to allow easier crossing from the left side.  I also elected to go ahead and treat during this setting to save the patient a subsequent procedure after our initial images were performed and showed disease that should be amenable to endovascular therapy.  A 4 mm diameter by 10 cm length Lutonix drug-coated angioplasty balloon was placed up and over the aortic bifurcation from the right and inflated to 12 atm for 1 minute and both common iliac arteries and down to the proximal left external iliac artery.  Following this, I was able to advance the Kumpe catheter and the advantage wire from the left femoral approach into the aorta and confirmed intraluminal flow.  The advantage wire from the right side was then  taken back and parked in the aorta and taken out of the iliac and femoral vessels.  With both wires pointing into the aorta and an intraluminal fashion, a kissing balloon stents were selected for the aortic bifurcation and proximal common iliac arteries.  7 mm diameter by 26 mm length lifestream stents were placed through 6 French sheaths bilaterally and parked about 5 to 7 mm into the aorta.  These were inflated to 12 to 14 atm simultaneously.  Following this, the left hypogastric artery was seen so I elected to place a noncovered stent in the left distal common iliac artery and proximal external iliac artery to treat the separate and distinct lesion several centimeters further down.  A 7 mm diameter by 6 cm length life star stent was deployed and postdilated with a 7 mm balloon.  Completion imaging showed less than 15% residual stenosis  in all areas treated with brisk flow bilaterally. I elected to terminate the procedure. The sheath was removed and StarClose closure device was deployed in the left femoral artery with excellent hemostatic result.  Similarly, the sheath was removed from the right femoral artery and StarClose closure device was deployed with excellent hemostatic result on the right as well.  The patient was taken to the recovery room in stable condition having tolerated the procedure well.  Findings:               Aortogram:  The renal arteries appear to be normal.  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  The proximal right common iliac artery had an 80 to 90% stenosis.  The proximal left common iliac artery was completely occluded for about 1 cm.  The vessel then normalized and there was another stenosis in the 70 to 75% range in the distal left common iliac artery and proximal left external iliac artery.  Both common femoral arteries appeared to have at least mild to moderate disease.             Left lower Extremity:  This demonstrated a small but patent left superficial femoral  artery and popliteal artery without focal stenosis.  The posterior tibial artery was continuous and was the dominant runoff to the foot although it was somewhat sluggish due to the inflow disease.  There appeared to be a small peroneal artery providing a second runoff vessel distall   Disposition: Patient was taken to the recovery room in stable condition having tolerated the procedure well.  Complications: None  Leotis Pain 01/11/2018 6:26 PM   This note was created with Dragon Medical transcription system. Any errors in dictation are purely unintentional.

## 2018-01-11 NOTE — Discharge Instructions (Addendum)
Femoral Site Care °Refer to this sheet in the next few weeks. These instructions provide you with information about caring for yourself after your procedure. Your health care provider may also give you more specific instructions. Your treatment has been planned according to current medical practices, but problems sometimes occur. Call your health care provider if you have any problems or questions after your procedure. °What can I expect after the procedure? °After your procedure, it is typical to have the following: °· Bruising at the site that usually fades within 1-2 weeks. °· Blood collecting in the tissue (hematoma) that may be painful to the touch. It should usually decrease in size and tenderness within 1-2 weeks. ° °Follow these instructions at home: °· Take medicines only as directed by your health care provider. °· You may shower 24-48 hours after the procedure or as directed by your health care provider. Remove the bandage (dressing) and gently wash the site with plain soap and water. Pat the area dry with a clean towel. Do not rub the site, because this may cause bleeding. °· Do not take baths, swim, or use a hot tub until your health care provider approves. °· Check your insertion site every day for redness, swelling, or drainage. °· Do not apply powder or lotion to the site. °· Limit use of stairs to twice a day for the first 2-3 days or as directed by your health care provider. °· Do not squat for the first 2-3 days or as directed by your health care provider. °· Do not lift over 10 lb (4.5 kg) for 5 days after your procedure or as directed by your health care provider. °· Ask your health care provider when it is okay to: °? Return to work or school. °? Resume usual physical activities or sports. °? Resume sexual activity. °· Do not drive home if you are discharged the same day as the procedure. Have someone else drive you. °· You may drive 24 hours after the procedure unless otherwise instructed by  your health care provider. °· Do not operate machinery or power tools for 24 hours after the procedure or as directed by your health care provider. °· If your procedure was done as an outpatient procedure, which means that you went home the same day as your procedure, a responsible adult should be with you for the first 24 hours after you arrive home. °· Keep all follow-up visits as directed by your health care provider. This is important. °Contact a health care provider if: °· You have a fever. °· You have chills. °· You have increased bleeding from the site. Hold pressure on the site. °Get help right away if: °· You have unusual pain at the site. °· You have redness, warmth, or swelling at the site. °· You have drainage (other than a small amount of blood on the dressing) from the site. °· The site is bleeding, and the bleeding does not stop after 30 minutes of holding steady pressure on the site. °· Your leg or foot becomes pale, cool, tingly, or numb. °This information is not intended to replace advice given to you by your health care provider. Make sure you discuss any questions you have with your health care provider. °Document Released: 12/09/2013 Document Revised: 09/13/2015 Document Reviewed: 10/25/2013 °Elsevier Interactive Patient Education © 2018 [employer] °Moderate Conscious Sedation, Adult, Care After °These instructions provide you with information about caring for yourself after your procedure. Your health care provider may also give you more   specific instructions. Your treatment has been planned according to current medical practices, but problems sometimes occur. Call your health care provider if you have any problems or questions after your procedure. °What can I expect after the procedure? °After your procedure, it is common: °· To feel sleepy for several hours. °· To feel clumsy and have poor balance for several hours. °· To have poor judgment for several hours. °· To vomit if you eat too  soon. ° °Follow these instructions at home: °For at least 24 hours after the procedure: ° °· Do not: °? Participate in activities where you could fall or become injured. °? Drive. °? Use heavy machinery. °? Drink alcohol. °? Take sleeping pills or medicines that cause drowsiness. °? Make important decisions or sign legal documents. °? Take care of children on your own. °· Rest. °Eating and drinking °· Follow the diet recommended by your health care provider. °· If you vomit: °? Drink water, juice, or soup when you can drink without vomiting. °? Make sure you have little or no nausea before eating solid foods. °General instructions °· Have a responsible adult stay with you until you are awake and alert. °· Take over-the-counter and prescription medicines only as told by your health care provider. °· If you smoke, do not smoke without supervision. °· Keep all follow-up visits as told by your health care provider. This is important. °Contact a health care provider if: °· You keep feeling nauseous or you keep vomiting. °· You feel light-headed. °· You develop a rash. °· You have a fever. °Get help right away if: °· You have trouble breathing. °This information is not intended to replace advice given to you by your health care provider. Make sure you discuss any questions you have with your health care provider. °Document Released: 01/26/2013 Document Revised: 09/10/2015 Document Reviewed: 07/28/2015 °Elsevier Interactive Patient Education © 2018 [employer]. ° °

## 2018-01-11 NOTE — H&P (Signed)
[facility] History & Physical Update  The patient was interviewed and re-examined.  The patient's previous History and Physical has been reviewed and is unchanged.  There is no change in the plan of care. We plan to proceed with the scheduled procedure.  Leotis Pain, MD  01/11/2018, 12:56 PM

## 2018-01-12 ENCOUNTER — Ambulatory Visit (INDEPENDENT_AMBULATORY_CARE_PROVIDER_SITE_OTHER): Payer: Medicare Other | Admitting: Vascular Surgery

## 2018-01-12 ENCOUNTER — Encounter: Payer: Self-pay | Admitting: Vascular Surgery

## 2018-01-12 ENCOUNTER — Ambulatory Visit (INDEPENDENT_AMBULATORY_CARE_PROVIDER_SITE_OTHER): Payer: Medicare Other

## 2018-01-12 ENCOUNTER — Other Ambulatory Visit (INDEPENDENT_AMBULATORY_CARE_PROVIDER_SITE_OTHER): Payer: Self-pay | Admitting: Vascular Surgery

## 2018-01-12 VITALS — BP 144/69 | HR 69 | Resp 17 | Ht 63.0 in | Wt 140.0 lb

## 2018-01-12 DIAGNOSIS — I70219 Atherosclerosis of native arteries of extremities with intermittent claudication, unspecified extremity: Secondary | ICD-10-CM | POA: Diagnosis not present

## 2018-01-12 DIAGNOSIS — I70213 Atherosclerosis of native arteries of extremities with intermittent claudication, bilateral legs: Secondary | ICD-10-CM

## 2018-01-12 DIAGNOSIS — Z95820 Peripheral vascular angioplasty status with implants and grafts: Secondary | ICD-10-CM

## 2018-01-12 DIAGNOSIS — I6523 Occlusion and stenosis of bilateral carotid arteries: Secondary | ICD-10-CM | POA: Diagnosis not present

## 2018-01-12 DIAGNOSIS — F17210 Nicotine dependence, cigarettes, uncomplicated: Secondary | ICD-10-CM

## 2018-01-12 DIAGNOSIS — E1159 Type 2 diabetes mellitus with other circulatory complications: Secondary | ICD-10-CM | POA: Diagnosis not present

## 2018-01-12 DIAGNOSIS — G458 Other transient cerebral ischemic attacks and related syndromes: Secondary | ICD-10-CM

## 2018-01-12 DIAGNOSIS — E118 Type 2 diabetes mellitus with unspecified complications: Secondary | ICD-10-CM

## 2018-01-12 NOTE — Patient Instructions (Signed)
Carotid Artery Disease The carotid arteries are arteries on both sides of the neck. They carry blood to the brain. Carotid artery disease is when the arteries get smaller (narrow) or get blocked. If these arteries get smaller or get blocked, you are more likely to have a stroke or warning stroke (transient ischemic attack). Follow these instructions at home:  Take medicines as told by your doctor. Make sure you understand all your medicine instructions. Do not stop your medicines without talking to your doctor first.  Follow your doctor's diet instructions. It is important to eat a healthy diet that includes plenty of: ? Fresh fruits. ? Vegetables. ? Lean meats.  Avoid: ? High-fat foods. ? High-sodium foods. ? Foods that are fried, overly processed, or have poor nutritional value.  Stay a healthy weight.  Stay active. Get at least 30 minutes of activity every day.  Do not smoke.  Limit alcohol use to: ? No more than 2 drinks a day for men. ? No more than 1 drink a day for women who are not pregnant.  Do not use illegal drugs.  Keep all doctor visits as told. Get help right away if:  You have sudden weakness or loss of feeling (numbness) on one side of the body, such as the face, arm, or leg.  You have sudden confusion.  You have trouble speaking (aphasia) or understanding.  You have sudden trouble seeing out of one or both eyes.  You have sudden trouble walking.  You have dizziness or feel like you might pass out (faint).  You have a loss of balance or your movements are not steady (uncoordinated).  You have a sudden, severe headache with no known cause.  You have trouble swallowing (dysphagia). Call your local emergency services (911 in [place] Do notdrive yourself to the clinic or hospital. This information is not intended to replace advice given to you by your health care provider. Make sure you discuss any questions you have with your health care  provider. Document Released: 03/24/2012 Document Revised: 09/13/2015 Document Reviewed: 10/06/2012 Elsevier Interactive Patient Education  2018 [employer] Inc.  

## 2018-01-12 NOTE — Progress Notes (Signed)
MRN : [id number]  Christine Obrien is a 77 y.o. ([birth year]) female who presents with chief complaint of  Chief Complaint  Patient presents with  . Follow-up    Carotid and ABI follow up  .  History of Present Illness: Patient returns in follow-up of multiple vascular issues.  Earlier this week, she underwent aortoiliac intervention for short distance claudication.  She had some significant immediate postoperative soreness and it felt like a charley horse in her right leg.  She had some discoloration of the toes but all this is now improving.  Her ABIs today are nearly normal at 0.87 on the right and 0.88 on the left with strong biphasic to triphasic waveforms.  This is markedly improved from her preoperative status.  Her carotid duplex today demonstrates 40 to 59% carotid artery stenosis bilaterally.  She has no focal neurologic symptoms.  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  This has not changed.  Current Outpatient Medications  Medication Sig Dispense Refill  . acetaminophen (TYLENOL) 325 MG tablet Tylenol    . ascorbic acid (VITAMIN C) 1000 MG tablet Take by mouth.    Marland Kitchen aspirin EC 81 MG tablet Take 81 mg by mouth.    . clopidogrel (PLAVIX) 75 MG tablet Take 1 tablet (75 mg total) by mouth daily. 30 tablet 11  . ezetimibe (ZETIA) 10 MG tablet Take by mouth.    . ferrous sulfate 325 (65 FE) MG tablet Take 325 mg by mouth.   0  . fluticasone (FLONASE) 50 MCG/ACT nasal spray instill 2 sprays into each nostril once daily    . FREESTYLE LITE test strip TEST TID UTD  10  . hydrocortisone (ANUSOL-HC) 2.5 % rectal cream Apply topically.    Marland Kitchen levothyroxine (SYNTHROID, LEVOTHROID) 150 MCG tablet   1  . metFORMIN (GLUCOPHAGE) 850 MG tablet Take 25 mg by mouth 2 (two) times daily with a meal.    . metoprolol tartrate (LOPRESSOR) 25 MG tablet Take 1 tablet (25 mg total) by mouth 2 (two) times  daily. 60 tablet 0  . Multiple Vitamin (MULTIVITAMIN) capsule Take 1 capsule by mouth daily.    . nitroGLYCERIN (NITROSTAT) 0.4 MG SL tablet 1 tablet(s) sublingual as needed for chest pain (may repeat every 5 minutes but seek medical help if pain persists after 3 tablets)    . omeprazole (PRILOSEC) 20 MG capsule Take 20 mg by mouth daily.     . Probiotic Product (PROBIOTIC DAILY PO) Take by mouth daily.    . sucralfate (CARAFATE) 1 g tablet Take 1 g by mouth daily.     Marland Kitchen venlafaxine (EFFEXOR) 75 MG tablet Take 75 mg by mouth 3 (three) times daily with meals.    . ALPRAZolam (XANAX) 0.5 MG tablet Take 0.5 mg by mouth at bedtime.      No current facility-administered medications for this visit.     Past Medical History:  Diagnosis Date  . Carotid artery occlusion   . Diabetes mellitus without complication ([place])   . Hypertension   . Myocardial infarction [facility])     Past Surgical History:  Procedure Laterality Date  . ABDOMINAL HYSTERECTOMY     partial  . CHOLECYSTECTOMY    . CORONARY ANGIOPLASTY    . CORONARY ARTERY BYPASS GRAFT     double  . LEFT HEART CATH AND CORONARY ANGIOGRAPHY N/A 07/03/2017   Procedure: LEFT HEART  CATH AND CORONARY ANGIOGRAPHY;  Surgeon: Corey Skains, MD;  Location: [facility];  Service: Cardiovascular;  Laterality: N/A;  . LOWER EXTREMITY ANGIOGRAPHY Left 01/11/2018   Procedure: LOWER EXTREMITY ANGIOGRAPHY;  Surgeon: Algernon Huxley, MD;  Location: [place] [facility];  Service: Cardiovascular;  Laterality: Left;  . THYROID SURGERY     pt does not remember if removed or partial removal    Social History  Substance Use Topics  . Smoking status: Current Every Day Smoker  . Smokeless tobacco: Never Used  . Alcohol use No    Family History No bleeding or clotting disorders       Allergies  Allergen Reactions  . Ace Inhibitors     Other reaction(s): Cough  . Atorvastatin     Other reaction(s): Muscle Pain  . Statins      Other reaction(s): Muscle Pain Muscle pain  . Sulfa Antibiotics     Other reaction(s): Unknown     REVIEW OF SYSTEMS (Negative unless checked)  Constitutional: [] Weight loss  [] Fever  [] Chills Cardiac: [] Chest pain   [] Chest pressure   [] Palpitations   [] Shortness of breath when laying flat   [] Shortness of breath at rest   [] Shortness of breath with exertion. Vascular:  [] Pain in legs with walking   [] Pain in legs at rest   [] Pain in legs when laying flat   [] Claudication   [] Pain in feet when walking  [] Pain in feet at rest  [] Pain in feet when laying flat   [] History of DVT   [] Phlebitis   [] Swelling in legs   [] Varicose veins   [] Non-healing ulcers Pulmonary:   [] Uses home oxygen   [] Productive cough   [] Hemoptysis   [] Wheeze  [] COPD   [] Asthma Neurologic:  [] Dizziness  [] Blackouts   [] Seizures   [] History of stroke   [] History of TIA  [] Aphasia   [] Temporary blindness   [] Dysphagia   [] Weakness or numbness in arms   [] Weakness or numbness in legs Musculoskeletal:  [] Arthritis   [] Joint swelling   [] Joint pain   [] Low back pain Hematologic:  [] Easy bruising  [] Easy bleeding   [] Hypercoagulable state   [] Anemic  [] Hepatitis Gastrointestinal:  [] Blood in stool   [] Vomiting blood  [] Gastroesophageal reflux/heartburn   [] Difficulty swallowing. Genitourinary:  [] Chronic kidney disease   [] Difficult urination  [] Frequent urination  [] Burning with urination   [] Blood in urine Skin:  [] Rashes   [] Ulcers   [] Wounds Psychological:  [] History of anxiety   []  History of major depression.    Physical Examination  Vitals:   01/12/18 1436 01/12/18 1437  BP: 112/71 (!) 144/69  Pulse: 69 69  Resp: 17   Weight: 140 lb (63.5 kg)   Height: 5\' 3"  (1.6 m)    Body mass index is 24.8 kg/m. Gen:  WD/WN, NAD Head: [place]/AT, No temporalis wasting. Ear/Nose/Throat: Hearing grossly intact, nares w/o erythema or drainage, trachea midline Eyes: Conjunctiva clear. Sclera non-icteric Neck: Supple.   Left carotid bruit  Pulmonary:  Good air movement, equal and clear to auscultation bilaterally.  Cardiac: RRR, No JVD Vascular:  Vessel Right Left  Radial Palpable  1+ palpable                          PT Palpable Palpable  DP Palpable Palpable    Musculoskeletal: M/S 5/5 throughout.  No deformity or atrophy.  No significant lower extremity edema. Neurologic: CN 2-12 intact. Sensation grossly intact in extremities.  Symmetrical.  Speech is fluent. Motor exam as listed above. Psychiatric: Judgment intact, Mood & affect appropriate for pt's clinical situation. Dermatologic: No rashes or ulcers noted.  No cellulitis or open wounds.      CBC Lab Results  Component Value Date   WBC 6.4 07/04/2017   HGB 11.9 (L) 07/04/2017   HCT 35.7 07/04/2017   MCV 91.4 07/04/2017   PLT 162 07/04/2017    BMET    Component Value Date/Time   NA 141 07/04/2017 0137   NA 142 04/01/2013 0456   K 4.6 01/11/2018 1332   K 3.6 04/01/2013 0456   CL 108 07/04/2017 0137   CL 110 (H) 04/01/2013 0456   CO2 29 07/04/2017 0137   CO2 26 04/01/2013 0456   GLUCOSE 100 (H) 07/04/2017 0137   GLUCOSE 93 04/01/2013 0456   BUN 16 01/11/2018 1332   BUN 7 04/01/2013 0456   CREATININE 0.87 01/11/2018 1332   CREATININE 0.73 04/01/2013 0456   CALCIUM 8.7 (L) 07/04/2017 0137   CALCIUM 9.0 04/01/2013 0456   GFRNONAA >60 01/11/2018 1332   GFRNONAA >60 04/01/2013 0456   GFRAA >60 01/11/2018 1332   GFRAA >60 04/01/2013 0456   Estimated Creatinine Clearance: 49.3 mL/min (by C-G formula based on SCr of 0.87 mg/dL).  COAG Lab Results  Component Value Date   INR 1.07 07/03/2017   INR 1.0 03/30/2013   INR 0.9 01/02/2012    Radiology No results found.    Assessment/Plan Diabetes (HCC) blood glucose control important in reducing the progression of atherosclerotic disease. Also, involved in wound healing. On appropriate medications.  Subclavian steal syndrome Not particularly symptomatic with her  left subclavian artery disease.  No treatment currently required  Carotid stenosis  Her carotid duplex today demonstrates 40 to 59% carotid artery stenosis bilaterally.  She has no focal neurologic symptoms.  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  This has not changed.  Continue current medical regimen.  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  This is markedly improved from her preoperative status.  She is still in the recovery phase from her procedure, but I would expect her claudication symptoms to be markedly improved.  Continue current medical regimen.  Recheck ABIs in 3 months    Leotis Pain, MD  01/12/2018 3:11 PM    This note was created with Dragon medical transcription system.  Any errors from dictation are purely unintentional

## 2018-01-12 NOTE — Assessment & Plan Note (Signed)
Her carotid duplex today demonstrates 40 to 59% carotid artery stenosis bilaterally.  She has no focal neurologic symptoms.  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  This has not changed.  Continue current medical regimen.  Plan to recheck in 1 year

## 2018-01-12 NOTE — Assessment & Plan Note (Signed)
Not particularly symptomatic with her left subclavian artery disease.  No treatment currently required

## 2018-01-12 NOTE — Assessment & Plan Note (Signed)
Her ABIs today are nearly normal at 0.87 on the right and 0.88 on the left with strong biphasic to triphasic waveforms.  This is markedly improved from her preoperative status.  She is still in the recovery phase from her procedure, but I would expect her claudication symptoms to be markedly improved.  Continue current medical regimen.  Recheck ABIs in 3 months

## 2018-01-13 ENCOUNTER — Encounter: Payer: Self-pay | Admitting: Vascular Surgery

## 2018-01-15 ENCOUNTER — Other Ambulatory Visit (INDEPENDENT_AMBULATORY_CARE_PROVIDER_SITE_OTHER): Payer: Self-pay | Admitting: Nurse Practitioner

## 2018-01-18 ENCOUNTER — Ambulatory Visit: Admit: 2018-01-18 | Payer: Medicare Other | Admitting: Vascular Surgery

## 2018-01-18 SURGERY — LOWER EXTREMITY ANGIOGRAPHY
Anesthesia: Moderate Sedation | Laterality: Right

## 2018-02-08 ENCOUNTER — Ambulatory Visit (INDEPENDENT_AMBULATORY_CARE_PROVIDER_SITE_OTHER): Payer: Medicare Other | Admitting: Nurse Practitioner

## 2018-02-08 ENCOUNTER — Encounter (INDEPENDENT_AMBULATORY_CARE_PROVIDER_SITE_OTHER): Payer: Self-pay | Admitting: Nurse Practitioner

## 2018-02-08 ENCOUNTER — Other Ambulatory Visit (INDEPENDENT_AMBULATORY_CARE_PROVIDER_SITE_OTHER): Payer: Self-pay | Admitting: Nurse Practitioner

## 2018-02-08 ENCOUNTER — Ambulatory Visit (INDEPENDENT_AMBULATORY_CARE_PROVIDER_SITE_OTHER): Payer: Medicare Other

## 2018-02-08 VITALS — BP 153/69 | HR 71 | Resp 18 | Ht 63.0 in | Wt 145.0 lb

## 2018-02-08 DIAGNOSIS — F17210 Nicotine dependence, cigarettes, uncomplicated: Secondary | ICD-10-CM

## 2018-02-08 DIAGNOSIS — I743 Embolism and thrombosis of arteries of the lower extremities: Secondary | ICD-10-CM

## 2018-02-08 DIAGNOSIS — E785 Hyperlipidemia, unspecified: Secondary | ICD-10-CM

## 2018-02-08 DIAGNOSIS — L97519 Non-pressure chronic ulcer of other part of right foot with unspecified severity: Secondary | ICD-10-CM | POA: Diagnosis not present

## 2018-02-08 DIAGNOSIS — M79671 Pain in right foot: Secondary | ICD-10-CM

## 2018-02-08 DIAGNOSIS — I70219 Atherosclerosis of native arteries of extremities with intermittent claudication, unspecified extremity: Secondary | ICD-10-CM | POA: Diagnosis not present

## 2018-02-09 MED ORDER — TRAMADOL HCL 50 MG PO TABS: 50.0000 mg | ORAL_TABLET | Freq: Four times a day (QID) | ORAL | 0 refills | Status: AC | PRN

## 2018-02-12 ENCOUNTER — Encounter (INDEPENDENT_AMBULATORY_CARE_PROVIDER_SITE_OTHER): Payer: Self-pay | Admitting: Nurse Practitioner

## 2018-02-12 DIAGNOSIS — I743 Embolism and thrombosis of arteries of the lower extremities: Secondary | ICD-10-CM | POA: Insufficient documentation

## 2018-02-12 NOTE — Progress Notes (Signed)
Subjective:    Patient ID: Christine Obrien, female    DOB: 1941/04/21, 77 y.o.   MRN: [id number] Chief Complaint  Patient presents with  . Follow-up    Surgery follow up    HPI  Christine Obrien is a 77 y.o. female The patient is seen for evaluation of the abrupt onset of pain in the right lower extremity. She underwent a lower extremity angiogram on 01/11/2018.  The patient notes that the pain began at the same time as her first, third, fourth and fifth toes began to have a dark hue and a small ulcerated area of the great toe.   Patient notes the pain is mostly continuous but worsens with pressure or walking.  The pain is consistent day to day since it began.  The patient notes the pain also occurs with standing and routinely seems worse as the day wears on. The pain was not present in the past. The patient states these symptoms are causing  a profound negative impact on quality of life and daily activities.  The patient denies a past history of claudication. No history of DVT or phlebitis. No prior interventions or surgeries.  There is a  history of back problems and DJD of the lumbar and sacral spine.   The underwent bilateral ABIs today with the Rt= 0.90 and Left=1.07 (previous Rt=0.87 and Left = 0.88).  Waveforms on the right lower extremity are biphasic through the level of the tibials and the left lower extremity is triphasic.  Toe waveforms on the second digit are normal.  The first digit has dampened, nearly flat waveforms and the third, fourth and fifth digits are not detected.  This varies slightly from the waveforms on 01/12/2018 which had waveforms of the second and third toe.    Past Medical History:  Diagnosis Date  . Carotid artery occlusion   . Diabetes mellitus without complication ([place])   . Hypertension   . Myocardial infarction [facility])     Past Surgical History:  Procedure Laterality Date  . ABDOMINAL HYSTERECTOMY     partial  . CHOLECYSTECTOMY    . CORONARY ANGIOPLASTY     . CORONARY ARTERY BYPASS GRAFT     double  . LEFT HEART CATH AND CORONARY ANGIOGRAPHY N/A 07/03/2017   Procedure: LEFT HEART CATH AND CORONARY ANGIOGRAPHY;  Surgeon: Corey Skains, MD;  Location: [place] [facility];  Service: Cardiovascular;  Laterality: N/A;  . LOWER EXTREMITY ANGIOGRAPHY Left 01/11/2018   Procedure: LOWER EXTREMITY ANGIOGRAPHY;  Surgeon: Algernon Huxley, MD;  Location: [place] [facility];  Service: Cardiovascular;  Laterality: Left;  . THYROID SURGERY     pt does not remember if removed or partial removal    Social History   Socioeconomic History  . Marital status: Widowed    Spouse name: Not on file  . Number of children: Not on file  . Years of education: Not on file  . Highest education level: Not on file  Occupational History  . Not on file  Social Needs  . Financial resource strain: Not on file  . Food insecurity:    Worry: Not on file    Inability: Not on file  . Transportation needs:    Medical: Not on file    Non-medical: Not on file  Tobacco Use  . Smoking status: Current Every Day Smoker    Packs/day: 1.00    Types: Cigarettes  . Smokeless tobacco: Never Used  Substance and Sexual Activity  .  Alcohol use: No  . Drug use: No  . Sexual activity: Not on file  Lifestyle  . Physical activity:    Days per week: Not on file    Minutes per session: Not on file  . Stress: Not on file  Relationships  . Social connections:    Talks on phone: Not on file    Gets together: Not on file    Attends religious service: Not on file    Active member of club or organization: Not on file    Attends meetings of clubs or organizations: Not on file    Relationship status: Not on file  . Intimate partner violence:    Fear of current or ex partner: Not on file    Emotionally abused: Not on file    Physically abused: Not on file    Forced sexual activity: Not on file  Other Topics Concern  . Not on file  Social History Narrative  . Not on file     Family History  Problem Relation Age of Onset  . Hypertension Mother   . Hypertension Father   . Diabetes Sister   . Cancer Brother   . Diabetes Brother     Allergies  Allergen Reactions  . Ace Inhibitors     Other reaction(s): Cough  . Atorvastatin     Other reaction(s): Muscle Pain  . Statins     Other reaction(s): Muscle Pain Muscle pain  . Sulfa Antibiotics     Stomach pains     Review of Systems   Review of Systems: Negative Unless Checked Constitutional: [] Weight loss  [] Fever  [] Chills Cardiac: [] Chest pain   []  Atrial Fibrillation  [] Palpitations   [] Shortness of breath when laying flat   [] Shortness of breath with exertion. Vascular:  [] Pain in legs with walking   [x] Pain in legs with standing  [] History of DVT   [] Phlebitis   [] Swelling in legs   [] Varicose veins   [] Non-healing ulcers Pulmonary:   [] Uses home oxygen   [] Productive cough   [] Hemoptysis   [] Wheeze  [] COPD   [] Asthma Neurologic:  [] Dizziness   [] Seizures   [] History of stroke   [] History of TIA  [] Aphasia   [] Vissual changes   [] Weakness or numbness in arm   [] Weakness or numbness in leg Musculoskeletal:   [] Joint swelling   [] Joint pain   [] Low back pain  []  History of Knee Replacement Hematologic:  [] Easy bruising  [] Easy bleeding   [] Hypercoagulable state   [] Anemic Gastrointestinal:  [] Diarrhea   [] Vomiting  [] Gastroesophageal reflux/heartburn   [] Difficulty swallowing. Genitourinary:  [] Chronic kidney disease   [] Difficult urination  [] Anuric   [] Blood in urine Skin:  [] Rashes   [x] Ulcers  Psychological:  [] History of anxiety   []  History of major depression  []  Memory Difficulties     Objective:   Physical Exam  BP (!) 153/69 (BP Location: Right Arm, Patient Position: Sitting)   Pulse 71   Resp 18   Ht 5\' 3"  (1.6 m)   Wt 145 lb (65.8 kg)   BMI 25.69 kg/m   Gen: WD/WN, NAD Head: [place]/AT, No temporalis wasting.  Ear/Nose/Throat: Hearing grossly intact, nares w/o erythema or  drainage Eyes: PER, EOMI, sclera nonicteric.  Neck: Supple, no masses.  No JVD.  Pulmonary:  Good air movement, no use of accessory muscles.  Cardiac: RRR Vascular: Darkened first, third, fourth and fifth toe of right foot . Small ulcerated area of first toe. Vessel Right Left  Radial  Palpable Palpable  Dorsalis Pedis Palpable Palpable  Posterior Tibial Palpable Palpable   Gastrointestinal: soft, non-distended. No guarding/no peritoneal signs.  Musculoskeletal: M/S 5/5 throughout.  No deformity or atrophy.  Neurologic: Pain and light touch intact in extremities.  Symmetrical.  Speech is fluent. Motor exam as listed above. Psychiatric: Judgment intact, Mood & affect appropriate for pt's clinical situation. Dermatologic: No Venous rashes. No Ulcers Noted.  No changes consistent with cellulitis. Lymph : No Cervical lymphadenopathy, no lichenification or skin changes of chronic lymphedema.      Assessment & Plan:   1. 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  The patient is on appropriate medical therapy, plavix and ASA.  She will continue medication and we will follow up in one month.   2. Atherosclerotic peripheral vascular disease with intermittent claudication ([place]) Patient is ambulating with claudication pain currently.  We will obtain follow up ABIs in one months.    3. Hyperlipidemia, unspecified hyperlipidemia type Continue statin as ordered and reviewed, no changes at this time    Current Outpatient Medications on File Prior to Visit  Medication Sig Dispense Refill  . acetaminophen (TYLENOL) 325 MG tablet Tylenol    . ALPRAZolam (XANAX) 0.5 MG tablet Take 0.5 mg by mouth at bedtime.     Marland Kitchen aspirin EC 81 MG tablet Take 81 mg by mouth.    . citalopram (CELEXA) 20 MG tablet Take 20 mg by mouth daily.    . clopidogrel (PLAVIX) 75 MG tablet Take 1 tablet (75 mg total) by mouth daily. 30 tablet  11  . doxepin (SINEQUAN) 10 MG capsule Take by mouth.    . fluticasone (FLONASE) 50 MCG/ACT nasal spray instill 2 sprays into each nostril once daily    . FREESTYLE LITE test strip TEST TID UTD  10  . hydrocortisone (ANUSOL-HC) 2.5 % rectal cream Apply topically.    Marland Kitchen levothyroxine (SYNTHROID, LEVOTHROID) 150 MCG tablet   1  . metFORMIN (GLUCOPHAGE) 850 MG tablet Take 25 mg by mouth 2 (two) times daily with a meal.    . methocarbamol (ROBAXIN) 500 MG tablet Take 500 mg by mouth 4 (four) times daily.    . Multiple Vitamin (MULTIVITAMIN) capsule Take 1 capsule by mouth daily.    . nitroGLYCERIN (NITROSTAT) 0.4 MG SL tablet 1 tablet(s) sublingual as needed for chest pain (may repeat every 5 minutes but seek medical help if pain persists after 3 tablets)    . omeprazole (PRILOSEC) 20 MG capsule Take 20 mg by mouth daily.     . Probiotic Product (PROBIOTIC DAILY PO) Take by mouth daily.    . sucralfate (CARAFATE) 1 g tablet Take 1 g by mouth daily.     Marland Kitchen venlafaxine (EFFEXOR) 75 MG tablet Take 75 mg by mouth 3 (three) times daily with meals.    Marland Kitchen ascorbic acid (VITAMIN C) 1000 MG tablet Take by mouth.    . ezetimibe (ZETIA) 10 MG tablet Take by mouth.    . ferrous sulfate 325 (65 FE) MG tablet Take 325 mg by mouth.   0  . metoprolol tartrate (LOPRESSOR) 25 MG tablet Take 1 tablet (25 mg total) by mouth 2 (two) times daily. (Patient not taking: Reported on 02/08/2018) 60 tablet 0   No current facility-administered medications on file prior to visit.     There are no Patient Instructions on file for this visit. No follow-ups on file.   Arna Medici  Earley Favor, NP  This note was completed with Sales executive.  Any errors are purely unintentional.

## 2018-03-12 ENCOUNTER — Encounter (INDEPENDENT_AMBULATORY_CARE_PROVIDER_SITE_OTHER): Payer: Self-pay | Admitting: Vascular Surgery

## 2018-03-12 ENCOUNTER — Ambulatory Visit (INDEPENDENT_AMBULATORY_CARE_PROVIDER_SITE_OTHER): Payer: Medicare Other | Admitting: Vascular Surgery

## 2018-03-12 ENCOUNTER — Ambulatory Visit (INDEPENDENT_AMBULATORY_CARE_PROVIDER_SITE_OTHER): Payer: Medicare Other

## 2018-03-12 VITALS — BP 148/78 | HR 48 | Resp 16 | Ht 63.0 in | Wt 144.0 lb

## 2018-03-12 DIAGNOSIS — I6523 Occlusion and stenosis of bilateral carotid arteries: Secondary | ICD-10-CM

## 2018-03-12 DIAGNOSIS — I70219 Atherosclerosis of native arteries of extremities with intermittent claudication, unspecified extremity: Secondary | ICD-10-CM

## 2018-03-12 DIAGNOSIS — E1151 Type 2 diabetes mellitus with diabetic peripheral angiopathy without gangrene: Secondary | ICD-10-CM

## 2018-03-12 DIAGNOSIS — F17200 Nicotine dependence, unspecified, uncomplicated: Secondary | ICD-10-CM

## 2018-03-12 NOTE — Progress Notes (Signed)
MRN : [id number]  Christine Obrien is a 77 y.o. (1940/06/29) female who presents with chief complaint of  Chief Complaint  Patient presents with  . Follow-up    86monthabi  .  History of Present Illness: Patient returns today in follow up of PAD.  Overall the patient seems to be doing fairly well.  The discomfort and discoloration of her right foot has largely improved although not entirely resolved in terms of the pain.  The color looks good today.  It is warm today.  No ulceration or infection. ABIs today remain good at 0.88 on the right and 0.89 on the left with strong waveforms.   Current Outpatient Medications  Medication Sig Dispense Refill  . acetaminophen (TYLENOL) 325 MG tablet Tylenol    . ALPRAZolam (XANAX) 0.5 MG tablet Take 0.5 mg by mouth at bedtime.     .Marland Kitchenascorbic acid (VITAMIN C) 1000 MG tablet Take by mouth.    .Marland Kitchenaspirin EC 81 MG tablet Take 81 mg by mouth.    . citalopram (CELEXA) 20 MG tablet Take 20 mg by mouth daily.    . clopidogrel (PLAVIX) 75 MG tablet Take 1 tablet (75 mg total) by mouth daily. 30 tablet 11  . doxepin (SINEQUAN) 10 MG capsule Take by mouth.    . ezetimibe (ZETIA) 10 MG tablet Take by mouth.    . ferrous sulfate 325 (65 FE) MG tablet Take 325 mg by mouth.   0  . fluticasone (FLONASE) 50 MCG/ACT nasal spray instill 2 sprays into each nostril once daily    . FREESTYLE LITE test strip TEST TID UTD  10  . hydrocortisone (ANUSOL-HC) 2.5 % rectal cream Apply topically.    .Marland Kitchenlevothyroxine (SYNTHROID, LEVOTHROID) 150 MCG tablet   1  . metFORMIN (GLUCOPHAGE) 850 MG tablet Take 25 mg by mouth 2 (two) times daily with a meal.    . methocarbamol (ROBAXIN) 500 MG tablet Take 500 mg by mouth 4 (four) times daily.    . Multiple Vitamin (MULTIVITAMIN) capsule Take 1 capsule by mouth daily.    . nitroGLYCERIN (NITROSTAT) 0.4 MG SL tablet 1 tablet(s) sublingual as needed for chest pain (may repeat every 5 minutes but seek medical help if pain persists after 3  tablets)    . omeprazole (PRILOSEC) 20 MG capsule Take 20 mg by mouth daily.     . Probiotic Product (PROBIOTIC DAILY PO) Take by mouth daily.    . sucralfate (CARAFATE) 1 g tablet Take 1 g by mouth daily.     .Marland Kitchenvenlafaxine (EFFEXOR) 75 MG tablet Take 75 mg by mouth 3 (three) times daily with meals.    . metoprolol tartrate (LOPRESSOR) 25 MG tablet Take 1 tablet (25 mg total) by mouth 2 (two) times daily. (Patient not taking: Reported on 02/08/2018) 60 tablet 0   No current facility-administered medications for this visit.     Past Medical History:  Diagnosis Date  . Carotid artery occlusion   . [place]   . Hypertension   . Myocardial infarction ([facility]     Past Surgical History:  Procedure Laterality Date  . ABDOMINAL HYSTERECTOMY     partial  . CHOLECYSTECTOMY    . CORONARY ANGIOPLASTY    . CORONARY ARTERY BYPASS GRAFT     double  . LEFT HEART CATH AND CORONARY ANGIOGRAPHY N/A 07/03/2017   Procedure: LEFT HEART CATH AND CORONARY ANGIOGRAPHY;  Surgeon: KCorey Skains MD;  [facility];  Service: Cardiovascular;  Laterality: N/A;  . LOWER EXTREMITY ANGIOGRAPHY Left 01/11/2018   Procedure: LOWER EXTREMITY ANGIOGRAPHY;  Surgeon: Algernon Huxley, MD;  Location: [place] [facility];  Service: Cardiovascular;  Laterality: Left;  . THYROID SURGERY     pt does not remember if removed or partial removal   Social History  Substance Use Topics  . Smoking status: Current Every Day Smoker  . Smokeless tobacco: Never Used  . Alcohol use No    Family History No bleeding or clotting disorders       Allergies  Allergen Reactions  . Ace Inhibitors     Other reaction(s): Cough  . Atorvastatin     Other reaction(s): Muscle Pain  . Statins     Other reaction(s): Muscle Pain Muscle pain  . Sulfa Antibiotics     Other reaction(s): Unknown     REVIEW OF SYSTEMS(Negative unless checked)  Constitutional:  _0 Weight loss_1 Fever_2 Chills Cardiac:_3 Chest pain_4 Chest pressure_5 Palpitations _6 Shortness of breath when laying flat _7 Shortness of breath at rest _8 Shortness of breath with exertion. Vascular: _9 Pain in legs with walking_10 Pain in legsat rest_11 Pain in legs when laying flat _12 Claudication _13 Pain in feet when walking _14 Pain in feet at rest _15 Pain in feet when laying flat _16 History of DVT _17 Phlebitis _18 Swelling in legs _19 Varicose veins _20 Non-healing ulcers Pulmonary: _21 Uses home oxygen _22 Productive cough_23 Hemoptysis _24 Wheeze _25 COPD _26 Asthma Neurologic: _27 Dizziness _28 Blackouts _29 Seizures _30 History of stroke _31 History of TIA_32 Aphasia _33 Temporary blindness_34 Dysphagia _35 Weaknessor numbness in arms _36 Weakness or numbnessin legs Musculoskeletal: _37 Arthritis _38 Joint swelling _39 Joint pain _40 Low back pain Hematologic:_41 Easy bruising_42 Easy bleeding _43 Hypercoagulable state _44 Anemic _45 Hepatitis Gastrointestinal:_46 Blood in stool_47 Vomiting blood_48 Gastroesophageal reflux/heartburn_49 Difficulty swallowing. Genitourinary: _50 Chronic kidney disease _51 Difficulturination _52 Frequenturination _53 Burning with urination_54 Blood in urine Skin: _55 Rashes _56 Ulcers _57 Wounds Psychological: _58 History of anxiety_59 History of major depression.    Physical Examination  BP (!) 148/78 (BP Location: Right Arm)   Pulse (!) 48   Resp 16   Ht _60  (1.6 m)   Wt 144 lb (65.3 kg)   BMI 25.51 kg/m  Gen:  WD/WN, NAD Head: [place]/AT, No temporalis wasting. Ear/Nose/Throat: Hearing grossly intact, nares w/o erythema or drainage Eyes: Conjunctiva clear. Sclera non-icteric Neck: Supple.  Trachea midline Pulmonary:  Good air movement, no use of accessory muscles.  Cardiac: RRR, no JVD Vascular:  Vessel Right Left  Radial Palpable Palpable                          PT Palpable Palpable  DP Palpable Palpable     Musculoskeletal: M/S 5/5 throughout.  No deformity or atrophy. No discoloration of the right foot or toes at this point.  No edema. Neurologic: Sensation grossly intact in extremities.  Symmetrical.  Speech is fluent.  Psychiatric: Judgment intact, Mood & affect appropriate for pt's clinical situation. Dermatologic: No rashes or ulcers noted.  No cellulitis or open wounds.       Labs Recent Results (from the past 2160 hour(s))  BUN     Status: None   Collection Time: 01/11/18  1:32 PM  Result Value Ref Range   BUN 16 8 - 23 mg/dL    Comment: Performed at [facility], [address] [place], [place] [address]  Creatinine, serum     Status: None   Collection Time: 01/11/18  1:32 PM  Result Value Ref Range   Creatinine, Ser 0.87 0.44 - 1.00 mg/dL   GFR calc non Af Amer >60 >60 mL/min   GFR calc Af Amer >60 >60 mL/min    Comment: (  NOTE) The eGFR has been calculated using the CKD EPI equation. This calculation has not been validated in all clinical situations. eGFR's persistently <60 mL/min signify possible Chronic Kidney Disease. Performed at [facility], [address] [place], [place] [address]   Potassium     Status: None   Collection Time: 01/11/18  1:32 PM  Result Value Ref Range   Potassium 4.6 3.5 - 5.1 mmol/L    Comment: Performed at [facility], [address] [place], [place] [address]  Glucose, capillary     Status: Abnormal   Collection Time: 01/11/18  1:39 PM  Result Value Ref Range   Glucose-Capillary 137 (H) 70 - 99 mg/dL    Radiology No results found.  Assessment/Plan Diabetes (HCC) blood glucose control important in reducing the progression of atherosclerotic disease. Also, involved in wound healing. On appropriate medications.  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  No treatment currently required  Carotid stenosis  Her carotid duplex earlier this  fall demonstrates 40 to 59% carotid artery stenosis bilaterally.  She has no focal neurologic symptoms.  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  This has not changed.  Continue current medical regimen.  Plan to recheck in 1 year  Atherosclerotic peripheral vascular disease with intermittent claudication ([place]) ABIs today remain good at 0.88 on the right and 0.89 on the left with strong waveforms.  The atheroembolization to the right foot seems to be markedly improved and there is really not much skin discoloration at this point.  She says it is still somewhat tender.  Continue current medical regimen.  Recheck noninvasive studies in 3-6 months.    Leotis Pain, MD  03/12/2018 4:07 PM    This note was created with Dragon medical transcription system.  Any errors from dictation are purely unintentional

## 2018-03-12 NOTE — Patient Instructions (Signed)

## 2018-03-12 NOTE — Assessment & Plan Note (Signed)
ABIs today remain good at 0.88 on the right and 0.89 on the left with strong waveforms.  The atheroembolization to the right foot seems to be markedly improved and there is really not much skin discoloration at this point.  She says it is still somewhat tender.  Continue current medical regimen.  Recheck noninvasive studies in 3-6 months.

## 2018-03-26 ENCOUNTER — Ambulatory Visit (INDEPENDENT_AMBULATORY_CARE_PROVIDER_SITE_OTHER): Payer: Medicare Other | Admitting: Vascular Surgery

## 2018-03-26 ENCOUNTER — Encounter (INDEPENDENT_AMBULATORY_CARE_PROVIDER_SITE_OTHER): Payer: Medicare Other

## 2018-04-16 ENCOUNTER — Ambulatory Visit (INDEPENDENT_AMBULATORY_CARE_PROVIDER_SITE_OTHER): Payer: Medicare Other

## 2018-04-16 ENCOUNTER — Ambulatory Visit (INDEPENDENT_AMBULATORY_CARE_PROVIDER_SITE_OTHER): Payer: Medicare Other | Admitting: Vascular Surgery

## 2018-04-16 ENCOUNTER — Encounter (INDEPENDENT_AMBULATORY_CARE_PROVIDER_SITE_OTHER): Payer: Self-pay | Admitting: Vascular Surgery

## 2018-04-16 VITALS — BP 146/79 | HR 75 | Resp 12 | Ht 63.0 in | Wt 144.6 lb

## 2018-04-16 DIAGNOSIS — I70219 Atherosclerosis of native arteries of extremities with intermittent claudication, unspecified extremity: Secondary | ICD-10-CM | POA: Diagnosis not present

## 2018-04-16 DIAGNOSIS — G458 Other transient cerebral ischemic attacks and related syndromes: Secondary | ICD-10-CM

## 2018-04-16 DIAGNOSIS — F17210 Nicotine dependence, cigarettes, uncomplicated: Secondary | ICD-10-CM

## 2018-04-16 DIAGNOSIS — E1151 Type 2 diabetes mellitus with diabetic peripheral angiopathy without gangrene: Secondary | ICD-10-CM | POA: Diagnosis not present

## 2018-04-16 DIAGNOSIS — I6523 Occlusion and stenosis of bilateral carotid arteries: Secondary | ICD-10-CM | POA: Diagnosis not present

## 2018-04-16 DIAGNOSIS — I743 Embolism and thrombosis of arteries of the lower extremities: Secondary | ICD-10-CM

## 2018-04-16 NOTE — Patient Instructions (Signed)
Peripheral Vascular Disease  Peripheral vascular disease (PVD) is a disease of the blood vessels that are not part of your heart and brain. A simple term for PVD is poor circulation. In most cases, PVD narrows the blood vessels that carry blood from your heart to the rest of your body. This can reduce the supply of blood to your arms, legs, and internal organs, like your stomach or kidneys. However, PVD most often affects a person's lower legs and feet. Without treatment, PVD tends to get worse. PVD can also lead to acute ischemic limb. This is when an arm or leg suddenly cannot get enough blood. This is a medical emergency. Follow these instructions at home: Lifestyle  Do not use any products that contain nicotine or tobacco, such as cigarettes and e-cigarettes. If you need help quitting, ask your doctor.  Lose weight if you are overweight. Or, stay at a healthy weight as told by your doctor.  Eat a diet that is low in fat and cholesterol. If you need help, ask your doctor.  Exercise regularly. Ask your doctor for activities that are right for you. General instructions  Take over-the-counter and prescription medicines only as told by your doctor.  Take good care of your feet: ? Wear comfortable shoes that fit well. ? Check your feet often for any cuts or sores.  Keep all follow-up visits as told by your doctor This is important. Contact a doctor if:  You have cramps in your legs when you walk.  You have leg pain when you are at rest.  You have coldness in a leg or foot.  Your skin changes.  You are unable to get or have an erection (erectile dysfunction).  You have cuts or sores on your feet that do not heal. Get help right away if:  Your arm or leg turns cold, numb, and blue.  Your arms or legs become red, warm, swollen, painful, or numb.  You have chest pain.  You have trouble breathing.  You suddenly have weakness in your face, arm, or leg.  You become very  confused or you cannot speak.  You suddenly have a very bad headache.  You suddenly cannot see. Summary  Peripheral vascular disease (PVD) is a disease of the blood vessels.  A simple term for PVD is poor circulation. Without treatment, PVD tends to get worse.  Treatment may include exercise, low fat and low cholesterol diet, and quitting smoking. This information is not intended to replace advice given to you by your health care provider. Make sure you discuss any questions you have with your health care provider. Document Released: 07/02/2009 Document Revised: 05/15/2016 Document Reviewed: 05/15/2016 Elsevier Interactive Patient Education  2019 [employer]  

## 2018-04-16 NOTE — Progress Notes (Signed)
MRN : [id number]  Christine Obrien is a 77 y.o. ([birth year]) female who presents with chief complaint of  Chief Complaint  Patient presents with  . Follow-up  .  History of Present Illness: Patient returns today in follow up of PAD.  She continues to follow with podiatry.  She has been doing soaks on her toes which has seemed to help some.  She had significant atheroembolization prior to her intervention 3 months ago and that has been slow to heal.  Her noninvasive studies today show normal arterial perfusion with ABIs of 1.03 on the right and 1.28 on the left with triphasic waveforms.  Her digital pressures are normal bilaterally as well.  Current Outpatient Medications  Medication Sig Dispense Refill  . acetaminophen (TYLENOL) 325 MG tablet Tylenol    . ascorbic acid (VITAMIN C) 1000 MG tablet Take by mouth.    Marland Kitchen aspirin EC 81 MG tablet Take 81 mg by mouth.    . clopidogrel (PLAVIX) 75 MG tablet Take 1 tablet (75 mg total) by mouth daily. 30 tablet 11  . doxepin (SINEQUAN) 10 MG capsule Take by mouth.    . ezetimibe (ZETIA) 10 MG tablet Take by mouth.    . ferrous sulfate 325 (65 FE) MG tablet Take 325 mg by mouth.   0  . levothyroxine (SYNTHROID, LEVOTHROID) 150 MCG tablet   1  . metFORMIN (GLUCOPHAGE) 850 MG tablet Take 25 mg by mouth 2 (two) times daily with a meal.    . metoprolol tartrate (LOPRESSOR) 25 MG tablet Take 1 tablet (25 mg total) by mouth 2 (two) times daily. 60 tablet 0  . Multiple Vitamin (MULTIVITAMIN) capsule Take 1 capsule by mouth daily.    Marland Kitchen omeprazole (PRILOSEC) 20 MG capsule Take 20 mg by mouth daily.     Marland Kitchen OVER THE COUNTER MEDICATION NEURIVA 1 TABLET DAILY    . Probiotic Product (PROBIOTIC DAILY PO) Take by mouth daily.    Marland Kitchen venlafaxine (EFFEXOR) 75 MG tablet Take 75 mg by mouth 3 (three) times daily with meals.     No current facility-administered medications for this visit.     Past Medical History:  Diagnosis Date  . Carotid artery occlusion   .  Diabetes mellitus without complication ([place])   . Hypertension   . Myocardial infarction [facility])     Past Surgical History:  Procedure Laterality Date  . ABDOMINAL HYSTERECTOMY     partial  . CHOLECYSTECTOMY    . CORONARY ANGIOPLASTY    . CORONARY ARTERY BYPASS GRAFT     double  . LEFT HEART CATH AND CORONARY ANGIOGRAPHY N/A 07/03/2017   Procedure: LEFT HEART CATH AND CORONARY ANGIOGRAPHY;  Surgeon: Corey Skains, MD;  Location: [place] [facility];  Service: Cardiovascular;  Laterality: N/A;  . LOWER EXTREMITY ANGIOGRAPHY Left 01/11/2018   Procedure: LOWER EXTREMITY ANGIOGRAPHY;  Surgeon: Algernon Huxley, MD;  Location: [place] [facility];  Service: Cardiovascular;  Laterality: Left;  . THYROID SURGERY     pt does not remember if removed or partial removal    Social History Social History   Tobacco Use  . Smoking status: Current Every Day Smoker    Packs/day: 1.00    Types: Cigarettes  . Smokeless tobacco: Never Used  Substance Use Topics  . Alcohol use: No  . Drug use: No    Family History Family History  Problem Relation Age of Onset  . Hypertension Mother   . Hypertension Father   .  Diabetes Sister   . Cancer Brother   . Diabetes Brother     Allergies  Allergen Reactions  . Ace Inhibitors     Other reaction(s): Cough  . Atorvastatin     Other reaction(s): Muscle Pain  . Statins     Other reaction(s): Muscle Pain Muscle pain  . Sulfa Antibiotics     Stomach pains     REVIEW OF SYSTEMS (Negative unless checked)  Constitutional: [] Weight loss  [] Fever  [] Chills Cardiac: [] Chest pain   [] Chest pressure   [] Palpitations   [] Shortness of breath when laying flat   [] Shortness of breath at rest   [] Shortness of breath with exertion. Vascular:  [x] Pain in legs with walking   [] Pain in legs at rest   [] Pain in legs when laying flat   [x] Claudication   [] Pain in feet when walking  [] Pain in feet at rest  [] Pain in feet when laying flat   [] History of DVT    [] Phlebitis   [x] Swelling in legs   [] Varicose veins   [] Non-healing ulcers Pulmonary:   [] Uses home oxygen   [] Productive cough   [] Hemoptysis   [] Wheeze  [] COPD   [] Asthma Neurologic:  [] Dizziness  [] Blackouts   [] Seizures   [] History of stroke   [] History of TIA  [] Aphasia   [] Temporary blindness   [] Dysphagia   [] Weakness or numbness in arms   [] Weakness or numbness in legs Musculoskeletal:  [x] Arthritis   [] Joint swelling   [] Joint pain   [] Low back pain Hematologic:  [] Easy bruising  [] Easy bleeding   [] Hypercoagulable state   [] Anemic   Gastrointestinal:  [] Blood in stool   [] Vomiting blood  [] Gastroesophageal reflux/heartburn   [] Abdominal pain Genitourinary:  [] Chronic kidney disease   [] Difficult urination  [] Frequent urination  [] Burning with urination   [] Hematuria Skin:  [] Rashes   [] Ulcers   [] Wounds Psychological:  [] History of anxiety   []  History of major depression.  Physical Examination  BP (!) 146/79 (BP Location: Right Arm, Patient Position: Sitting)   Pulse 75   Resp 12   Ht 5\' 3"  (1.6 m)   Wt 144 lb 9.6 oz (65.6 kg)   BMI 25.61 kg/m  Gen:  WD/WN, NAD Head: [place]/AT, No temporalis wasting. Ear/Nose/Throat: Hearing grossly intact, nares w/o erythema or drainage Eyes: Conjunctiva clear. Sclera non-icteric Neck: Supple.  Trachea midline Pulmonary:  Good air movement, no use of accessory muscles.  Cardiac: RRR, no JVD Vascular:  Vessel Right Left  Radial Palpable Palpable                          PT Palpable Palpable  DP Palpable Palpable    Musculoskeletal: M/S 5/5 throughout.  No deformity or atrophy.  No edema. Neurologic: Sensation grossly intact in extremities.  Symmetrical.  Speech is fluent.  Psychiatric: Judgment intact, Mood & affect appropriate for pt's clinical situation. Dermatologic: No rashes or ulcers noted.  No cellulitis or open wounds.       Labs No results found for this or any previous visit (from the past 2160  hour(s)).  Radiology No results found.  Assessment/Plan Diabetes (HCC) blood glucose control important in reducing the progression of atherosclerotic disease. Also, involved in wound healing. On appropriate medications.  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  No treatment currently required  Carotid stenosis  Her carotid duplex a few months ago demonstrates 40 to 59% carotid artery stenosis bilaterally.  She  has no focal neurologic symptoms.  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  This has not changed.  Continue current medical regimen.  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  Slowly improving.  Arterial perfusion is now normal.  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  Perfusion is now normal on her studies today.  Continue current medical regimen.  Return to clinic in about 6 months with noninvasive studies.    Leotis Pain, MD  04/16/2018 3:15 PM    This note was created with Dragon medical transcription system.  Any errors from dictation are purely unintentional

## 2018-04-16 NOTE — Assessment & Plan Note (Signed)
Improved after revascularization about 3 months ago.  Perfusion is now normal on her studies today.  Continue current medical regimen.  Return to clinic in about 6 months with noninvasive studies.

## 2018-04-16 NOTE — Assessment & Plan Note (Signed)
Blue toe syndrome.  Slowly improving.  Arterial perfusion is now normal.

## 2018-06-11 ENCOUNTER — Encounter (INDEPENDENT_AMBULATORY_CARE_PROVIDER_SITE_OTHER): Payer: Medicare Other

## 2018-06-11 ENCOUNTER — Ambulatory Visit (INDEPENDENT_AMBULATORY_CARE_PROVIDER_SITE_OTHER): Payer: Medicare Other | Admitting: Vascular Surgery

## 2018-06-23 ENCOUNTER — Other Ambulatory Visit: Payer: Self-pay | Admitting: Physician Assistant

## 2018-06-23 DIAGNOSIS — IMO0001 Reserved for inherently not codable concepts without codable children: Secondary | ICD-10-CM

## 2018-06-23 DIAGNOSIS — H9041 Sensorineural hearing loss, unilateral, right ear, with unrestricted hearing on the contralateral side: Secondary | ICD-10-CM

## 2018-07-01 ENCOUNTER — Other Ambulatory Visit: Payer: Self-pay

## 2018-07-01 ENCOUNTER — Ambulatory Visit
Admission: RE | Admit: 2018-07-01 | Discharge: 2018-07-01 | Disposition: A | Discharge status: Home / Self Care | Payer: Medicare Other | Source: Ambulatory Visit | Attending: Physician Assistant | Admitting: Physician Assistant

## 2018-07-01 DIAGNOSIS — IMO0001 Reserved for inherently not codable concepts without codable children: Secondary | ICD-10-CM

## 2018-07-01 DIAGNOSIS — H9041 Sensorineural hearing loss, unilateral, right ear, with unrestricted hearing on the contralateral side: Secondary | ICD-10-CM | POA: Diagnosis present

## 2018-07-01 LAB — POCT I-STAT CREATININE: Creatinine, Ser: 1 mg/dL (ref 0.44–1.00)

## 2018-07-01 MED ORDER — GADOBUTROL 1 MMOL/ML IV SOLN
6.0000 mL | Freq: Once | INTRAVENOUS | Status: AC | PRN
  Administered 2018-07-01: 6 mL via INTRAVENOUS

## 2018-09-03 ENCOUNTER — Ambulatory Visit (INDEPENDENT_AMBULATORY_CARE_PROVIDER_SITE_OTHER): Payer: Medicare Other | Admitting: Nurse Practitioner

## 2018-09-03 ENCOUNTER — Encounter (INDEPENDENT_AMBULATORY_CARE_PROVIDER_SITE_OTHER): Payer: Medicare Other

## 2018-09-16 ENCOUNTER — Other Ambulatory Visit: Payer: Self-pay

## 2018-09-16 ENCOUNTER — Encounter (INDEPENDENT_AMBULATORY_CARE_PROVIDER_SITE_OTHER): Payer: Self-pay | Admitting: Nurse Practitioner

## 2018-09-16 ENCOUNTER — Ambulatory Visit (INDEPENDENT_AMBULATORY_CARE_PROVIDER_SITE_OTHER): Payer: Medicare Other | Admitting: Nurse Practitioner

## 2018-09-16 ENCOUNTER — Ambulatory Visit (INDEPENDENT_AMBULATORY_CARE_PROVIDER_SITE_OTHER): Payer: Medicare Other

## 2018-09-16 VITALS — BP 166/76 | HR 70 | Resp 16 | Ht 63.0 in | Wt 147.8 lb

## 2018-09-16 DIAGNOSIS — Z7984 Long term (current) use of oral hypoglycemic drugs: Secondary | ICD-10-CM

## 2018-09-16 DIAGNOSIS — I70219 Atherosclerosis of native arteries of extremities with intermittent claudication, unspecified extremity: Secondary | ICD-10-CM

## 2018-09-16 DIAGNOSIS — Z95820 Peripheral vascular angioplasty status with implants and grafts: Secondary | ICD-10-CM

## 2018-09-16 DIAGNOSIS — E785 Hyperlipidemia, unspecified: Secondary | ICD-10-CM | POA: Diagnosis not present

## 2018-09-16 DIAGNOSIS — Z79899 Other long term (current) drug therapy: Secondary | ICD-10-CM

## 2018-09-16 DIAGNOSIS — F17210 Nicotine dependence, cigarettes, uncomplicated: Secondary | ICD-10-CM

## 2018-09-16 DIAGNOSIS — E1151 Type 2 diabetes mellitus with diabetic peripheral angiopathy without gangrene: Secondary | ICD-10-CM | POA: Diagnosis not present

## 2018-09-16 NOTE — Progress Notes (Signed)
SUBJECTIVE:  Patient ID: Christine Obrien, female    DOB: 09-28-1940, 78 y.o.   MRN: [id number] Chief Complaint  Patient presents with  . Follow-up    5month abi    HPI  Christine Obrien is a 78 y.o. female The patient returns to the office for followup and review of the noninvasive studies. There have been no interval changes in lower extremity symptoms. No interval shortening of the patient's claudication distance or development of rest pain symptoms.  The patient developed a wound to her right lateral foot.  It was associated with extensive pain.  She was advised to contact her office for noninvasive studies by her podiatrist Dr. Vickki Muff.  However, since her appointment is made she had an appointment with Dr. Vickki Muff and he reports that it is healing very well.  Patient's most recent intervention was on 01/11/2018 with bilateral common iliac artery stents as well as stents to the left distal common iliac artery and proximal external iliac artery.  There have been no significant changes to the patient's overall health care.  The patient denies amaurosis fugax or recent TIA symptoms. There are no recent neurological changes noted. The patient denies history of DVT, PE or superficial thrombophlebitis. The patient denies recent episodes of angina or shortness of breath.   ABI Rt=0.96 and Lt=0.99  (previous ABI's Rt=1.03 and Lt=1.28) Duplex ultrasound of the reveals triphasic waveforms within her bilateral tibial arteries.  Past Medical History:  Diagnosis Date  . Carotid artery occlusion   . Diabetes mellitus without complication ([place])   . Hypertension   . Myocardial infarction [facility])     Past Surgical History:  Procedure Laterality Date  . ABDOMINAL HYSTERECTOMY     partial  . CHOLECYSTECTOMY    . CORONARY ANGIOPLASTY    . CORONARY ARTERY BYPASS GRAFT     double  . LEFT HEART CATH AND CORONARY ANGIOGRAPHY N/A 07/03/2017   Procedure: LEFT HEART CATH AND CORONARY ANGIOGRAPHY;  Surgeon:  Corey Skains, MD;  Location: [place] [facility];  Service: Cardiovascular;  Laterality: N/A;  . LOWER EXTREMITY ANGIOGRAPHY Left 01/11/2018   Procedure: LOWER EXTREMITY ANGIOGRAPHY;  Surgeon: Algernon Huxley, MD;  Location: [place] [facility];  Service: Cardiovascular;  Laterality: Left;  . THYROID SURGERY     pt does not remember if removed or partial removal    Social History   Socioeconomic History  . Marital status: Widowed    Spouse name: Not on file  . Number of children: Not on file  . Years of education: Not on file  . Highest education level: Not on file  Occupational History  . Not on file  Social Needs  . Financial resource strain: Not on file  . Food insecurity:    Worry: Not on file    Inability: Not on file  . Transportation needs:    Medical: Not on file    Non-medical: Not on file  Tobacco Use  . Smoking status: Current Every Day Smoker    Packs/day: 1.00    Types: Cigarettes  . Smokeless tobacco: Never Used  Substance and Sexual Activity  . Alcohol use: No  . Drug use: No  . Sexual activity: Not on file  Lifestyle  . Physical activity:    Days per week: Not on file    Minutes per session: Not on file  . Stress: Not on file  Relationships  . Social connections:    Talks on phone: Not on file  Gets together: Not on file    Attends religious service: Not on file    Active member of club or organization: Not on file    Attends meetings of clubs or organizations: Not on file    Relationship status: Not on file  . Intimate partner violence:    Fear of current or ex partner: Not on file    Emotionally abused: Not on file    Physically abused: Not on file    Forced sexual activity: Not on file  Other Topics Concern  . Not on file  Social History Narrative  . Not on file    Family History  Problem Relation Age of Onset  . Hypertension Mother   . Hypertension Father   . Diabetes Sister   . Cancer Brother   . Diabetes Brother      Allergies  Allergen Reactions  . Ace Inhibitors     Other reaction(s): Cough  . Atorvastatin     Other reaction(s): Muscle Pain  . Statins     Other reaction(s): Muscle Pain Muscle pain  . Sulfa Antibiotics     Stomach pains     Review of Systems   Review of Systems: Negative Unless Checked Constitutional: [] Weight loss  [] Fever  [] Chills Cardiac: [] Chest pain   [x]  Atrial Fibrillation  [] Palpitations   [] Shortness of breath when laying flat   [] Shortness of breath with exertion. [] Shortness of breath at rest Vascular:  [] Pain in legs with walking   [] Pain in legs with standing [] Pain in legs when laying flat   [] Claudication    [] Pain in feet when laying flat    [] History of DVT   [] Phlebitis   [] Swelling in legs   [] Varicose veins   [] Non-healing ulcers Pulmonary:   [] Uses home oxygen   [] Productive cough   [] Hemoptysis   [] Wheeze  [] COPD   [] Asthma Neurologic:  [] Dizziness   [] Seizures  [] Blackouts [] History of stroke   [] History of TIA  [] Aphasia   [] Temporary Blindness   [] Weakness or numbness in arm   [] Weakness or numbness in leg Musculoskeletal:   [] Joint swelling   [] Joint pain   [] Low back pain  []  History of Knee Replacement [x] Arthritis [] back Surgeries  []  Spinal Stenosis    Hematologic:  [] Easy bruising  [] Easy bleeding   [] Hypercoagulable state   [] Anemic Gastrointestinal:  [] Diarrhea   [] Vomiting  [] Gastroesophageal reflux/heartburn   [] Difficulty swallowing. [] Abdominal pain Genitourinary:  [] Chronic kidney disease   [] Difficult urination  [] Anuric   [] Blood in urine [] Frequent urination  [] Burning with urination   [] Hematuria Skin:  [] Rashes   [] Ulcers [x] Wounds Psychological:  [x] History of anxiety   [x]  History of major depression  []  Memory Difficulties      OBJECTIVE:   Physical Exam  BP (!) 166/76 (BP Location: Right Arm)   Pulse 70   Resp 16   Ht 5\' 3"  (1.6 m)   Wt 147 lb 12.8 oz (67 kg)   BMI 26.18 kg/m   Gen: WD/WN, NAD Head: [place]/AT, No temporalis  wasting.  Ear/Nose/Throat: Hearing grossly intact, nares w/o erythema or drainage Eyes: PER, EOMI, sclera nonicteric.  Neck: Supple, no masses.  No JVD.  Pulmonary:  Good air movement, no use of accessory muscles.  Cardiac: RRR Vascular:  Wound on lateral edge of right foot  Vessel Right Left  Radial Palpable Palpable  Dorsalis Pedis Palpable Palpable  Posterior Tibial Palpable Palpable   Gastrointestinal: soft, non-distended. No guarding/no peritoneal signs.  Musculoskeletal: M/S 5/5 throughout.  No deformity or atrophy.  Neurologic: Pain and light touch intact in extremities.  Symmetrical.  Speech is fluent. Motor exam as listed above. Psychiatric: Judgment intact, Mood & affect appropriate for pt's clinical situation. Dermatologic: No Venous rashes. No Ulcers Noted.  No changes consistent with cellulitis. Lymph : No Cervical lymphadenopathy, no lichenification or skin changes of chronic lymphedema.       ASSESSMENT AND PLAN:  1. Atherosclerotic peripheral vascular disease with intermittent claudication (HCC) Recommend:  At this time the patient has adequate blood flow for wound healing.  The patient is advised to stop smoking as this will delay any wound healing as well as decrease longevity of her interventions.   Patient should undergo noninvasive studies as ordered. The patient will follow up with me after the studies.   In the mean time the patient should continue walking and an exercise program.  The patient should continue antiplatelet therapy and aggressive treatment of the lipid abnormalities  Smoking cessation was again discussed  The patient should continue wearing graduated compression socks 10-15 mmHg strength to control the mild edema.  Because the  patient has had multiple ulcerations recently we will follow-up with her in 6 months to ensure that there are no further ulcerations as well as to ensure that her current wounds are healed.  If her next visit without incident we will likely go to annual follow-ups.  - VAS [place] ABI WITH/WO TBI; Future  2. Type 2 diabetes mellitus with diabetic peripheral angiopathy without gangrene, unspecified whether long term insulin use ([place]) Continue hypoglycemic medications as already ordered, these medications have been reviewed and there are no changes at this time.  Hgb A1C to be monitored as already arranged by primary service   3. Hyperlipidemia, unspecified hyperlipidemia type Continue statin as ordered and reviewed, no changes at this time    Current Outpatient Medications on File Prior to Visit  Medication Sig Dispense Refill  . Acetaminophen (TYLENOL PO) Take 500 mg by mouth.     Marland Kitchen aspirin EC 81 MG tablet Take 81 mg by mouth.    . cephALEXin (KEFLEX) 500 MG capsule 3 (three) times daily.     . clopidogrel (PLAVIX) 75 MG tablet Take 1 tablet (75 mg total) by mouth daily. 30 tablet 11  . ezetimibe (ZETIA) 10 MG tablet Take by mouth.    . ferrous sulfate 325 (65 FE) MG tablet Take 325 mg by mouth.   0  . fluticasone (FLONASE) 50 MCG/ACT nasal spray Place into the nose.    Marland Kitchen Fluticasone-Salmeterol (ADVAIR DISKUS) 250-50 MCG/DOSE AEPB INHALE 1 PUFF INTO THE LUNGS TWICE DAILY    . levothyroxine (SYNTHROID, LEVOTHROID) 150 MCG tablet   1  . loratadine (CLARITIN) 10 MG tablet TAKE 1 TABLET BY MOUTH ONCE DAILY    . metFORMIN (GLUCOPHAGE) 500 MG tablet Take by mouth 2 (two) times daily with a meal.     . metoprolol tartrate (LOPRESSOR) 25 MG tablet Take 1 tablet (25 mg total) by mouth 2 (two) times daily. 60 tablet 0  . Multiple Vitamin (MULTIVITAMIN) capsule Take 1 capsule by mouth daily.    Marland Kitchen omeprazole (PRILOSEC) 20 MG capsule Take 20 mg by mouth daily.     . predniSONE (DELTASONE) 10 MG tablet TK  UTD. FOLLOW DIRECTONS ON SHEET    .  rosuvastatin (CRESTOR) 5 MG tablet 5 mg.     . venlafaxine (EFFEXOR) 75 MG tablet Take 75 mg by mouth 3 (three) times daily with meals.    Marland Kitchen ascorbic acid (VITAMIN C) 1000 MG tablet Take by mouth.    . doxepin (SINEQUAN) 10 MG capsule Take by mouth.    Marland Kitchen OVER THE COUNTER MEDICATION NEURIVA 1 TABLET DAILY    . Probiotic Product (PROBIOTIC DAILY PO) Take by mouth daily.     No current facility-administered medications on file prior to visit.     There are no Patient Instructions on file for this visit. Return in about 6 months (around 03/19/2019) for PAD.   Kris Hartmann, NP  This note was completed with Sales executive.  Any errors are purely unintentional.

## 2018-10-12 ENCOUNTER — Ambulatory Visit (INDEPENDENT_AMBULATORY_CARE_PROVIDER_SITE_OTHER): Payer: Medicare Other | Admitting: Vascular Surgery

## 2018-10-12 ENCOUNTER — Encounter (INDEPENDENT_AMBULATORY_CARE_PROVIDER_SITE_OTHER): Payer: Medicare Other

## 2018-12-02 DIAGNOSIS — I447 Left bundle-branch block, unspecified: Secondary | ICD-10-CM | POA: Insufficient documentation

## 2018-12-28 ENCOUNTER — Other Ambulatory Visit: Payer: Self-pay | Admitting: Family Medicine

## 2018-12-28 DIAGNOSIS — Z1231 Encounter for screening mammogram for malignant neoplasm of breast: Secondary | ICD-10-CM

## 2019-01-27 ENCOUNTER — Other Ambulatory Visit (INDEPENDENT_AMBULATORY_CARE_PROVIDER_SITE_OTHER): Payer: Self-pay | Admitting: Vascular Surgery

## 2019-02-01 ENCOUNTER — Ambulatory Visit
Admission: RE | Admit: 2019-02-01 | Discharge: 2019-02-01 | Disposition: A | Discharge status: Home / Self Care | Payer: Medicare Other | Source: Ambulatory Visit | Attending: Family Medicine | Admitting: Family Medicine

## 2019-02-01 DIAGNOSIS — Z1231 Encounter for screening mammogram for malignant neoplasm of breast: Secondary | ICD-10-CM | POA: Insufficient documentation

## 2019-02-14 DIAGNOSIS — I5023 Acute on chronic systolic (congestive) heart failure: Secondary | ICD-10-CM | POA: Insufficient documentation

## 2019-02-14 DIAGNOSIS — I5022 Chronic systolic (congestive) heart failure: Secondary | ICD-10-CM | POA: Insufficient documentation

## 2019-04-11 ENCOUNTER — Other Ambulatory Visit (INDEPENDENT_AMBULATORY_CARE_PROVIDER_SITE_OTHER): Payer: Self-pay | Admitting: Vascular Surgery

## 2019-04-11 DIAGNOSIS — I6523 Occlusion and stenosis of bilateral carotid arteries: Secondary | ICD-10-CM

## 2019-04-19 ENCOUNTER — Ambulatory Visit (INDEPENDENT_AMBULATORY_CARE_PROVIDER_SITE_OTHER): Payer: Medicare Other | Admitting: Vascular Surgery

## 2019-04-19 ENCOUNTER — Ambulatory Visit (INDEPENDENT_AMBULATORY_CARE_PROVIDER_SITE_OTHER): Payer: Medicare Other

## 2019-04-19 ENCOUNTER — Encounter (INDEPENDENT_AMBULATORY_CARE_PROVIDER_SITE_OTHER): Payer: Self-pay

## 2019-04-19 ENCOUNTER — Other Ambulatory Visit: Payer: Self-pay

## 2019-04-19 ENCOUNTER — Encounter (INDEPENDENT_AMBULATORY_CARE_PROVIDER_SITE_OTHER): Payer: Self-pay | Admitting: Vascular Surgery

## 2019-04-19 VITALS — BP 109/73 | HR 67 | Resp 16 | Ht 63.0 in | Wt 147.0 lb

## 2019-04-19 DIAGNOSIS — E1151 Type 2 diabetes mellitus with diabetic peripheral angiopathy without gangrene: Secondary | ICD-10-CM

## 2019-04-19 DIAGNOSIS — I6523 Occlusion and stenosis of bilateral carotid arteries: Secondary | ICD-10-CM | POA: Diagnosis not present

## 2019-04-19 DIAGNOSIS — G458 Other transient cerebral ischemic attacks and related syndromes: Secondary | ICD-10-CM

## 2019-04-19 DIAGNOSIS — I70219 Atherosclerosis of native arteries of extremities with intermittent claudication, unspecified extremity: Secondary | ICD-10-CM | POA: Diagnosis not present

## 2019-04-19 DIAGNOSIS — I743 Embolism and thrombosis of arteries of the lower extremities: Secondary | ICD-10-CM | POA: Diagnosis not present

## 2019-04-19 NOTE — Progress Notes (Signed)
MRN : [id number]  Christine Obrien is a 78 y.o. (Aug 14, 1940) female who presents with chief complaint of  Chief Complaint  Patient presents with  . Follow-up    ultrasound  .  History of Present Illness: Patient returns in follow-up of multiple vascular issues.  She is doing well today without specific complaints.  She underwent intervention for aortoiliac disease with embolization previously and has done well without recurrent ulceration, rest pain, or lifestyle limiting claudication.  Her ABIs today are normal at 1.09 on the right and 1.11 on the left with triphasic waveforms and normal digital pressures bilaterally.  Current Outpatient Medications  Medication Sig Dispense Refill  . Acetaminophen (TYLENOL PO) Take 500 mg by mouth.     . ALPRAZolam (XANAX PO) alprazolam    . ascorbic acid (VITAMIN C) 1000 MG tablet Take by mouth.    Marland Kitchen buPROPion (WELLBUTRIN SR) 100 MG 12 hr tablet TAKE 1 TABLET DAILY    . clopidogrel (PLAVIX) 75 MG tablet TAKE 1 TABLET BY MOUTH DAILY 30 tablet 11  . empagliflozin (JARDIANCE) 10 MG TABS tablet Take by mouth.    . ferrous sulfate 325 (65 FE) MG tablet Take 325 mg by mouth.   0  . fidaxomicin (DIFICID) 200 MG TABS tablet Take by mouth.    . fluticasone (FLONASE) 50 MCG/ACT nasal spray Place into the nose.    Marland Kitchen Fluticasone-Salmeterol (ADVAIR DISKUS) 250-50 MCG/DOSE AEPB INHALE 1 PUFF INTO THE LUNGS TWICE DAILY    . levothyroxine (SYNTHROID, LEVOTHROID) 150 MCG tablet   1  . loratadine (CLARITIN) 10 MG tablet TAKE 1 TABLET BY MOUTH ONCE DAILY    . losartan (COZAAR) 25 MG tablet Take by mouth.    . metFORMIN (GLUCOPHAGE) 500 MG tablet Take by mouth 2 (two) times daily with a meal.     . metoprolol tartrate (LOPRESSOR) 25 MG tablet Take 1 tablet (25 mg total) by mouth 2 (two) times daily. 60 tablet 0  . Multiple Vitamin (MULTIVITAMIN) capsule Take 1 capsule by mouth daily.    . Omega-3 Fatty Acids (FISH OIL) 1000 MG CAPS Take by mouth.    Marland Kitchen omeprazole  (PRILOSEC) 20 MG capsule Take 20 mg by mouth daily.     . rosuvastatin (CRESTOR) 5 MG tablet 5 mg.     . venlafaxine (EFFEXOR) 75 MG tablet Take 75 mg by mouth 3 (three) times daily with meals.    Marland Kitchen aspirin EC 81 MG tablet Take 81 mg by mouth.    . cephALEXin (KEFLEX) 500 MG capsule 3 (three) times daily.     Marland Kitchen doxepin (SINEQUAN) 10 MG capsule Take by mouth.    . ezetimibe (ZETIA) 10 MG tablet Take by mouth.    Marland Kitchen OVER THE COUNTER MEDICATION NEURIVA 1 TABLET DAILY    . predniSONE (DELTASONE) 10 MG tablet TK UTD. FOLLOW DIRECTONS ON SHEET    . Probiotic Product (PROBIOTIC DAILY PO) Take by mouth daily.     No current facility-administered medications for this visit.    Past Medical History:  Diagnosis Date  . Carotid artery occlusion   . Diabetes mellitus without complication ([place])   . Hypertension   . Myocardial infarction [facility])     Past Surgical History:  Procedure Laterality Date  . ABDOMINAL HYSTERECTOMY     partial  . CHOLECYSTECTOMY    . CORONARY ANGIOPLASTY    . CORONARY ARTERY BYPASS GRAFT     double  . LEFT HEART CATH AND CORONARY ANGIOGRAPHY  N/A 07/03/2017   Procedure: LEFT HEART CATH AND CORONARY ANGIOGRAPHY;  Surgeon: Corey Skains, MD;  Location: [place] [facility];  Service: Cardiovascular;  Laterality: N/A;  . LOWER EXTREMITY ANGIOGRAPHY Left 01/11/2018   Procedure: LOWER EXTREMITY ANGIOGRAPHY;  Surgeon: Algernon Huxley, MD;  Location: [place] [facility];  Service: Cardiovascular;  Laterality: Left;  . THYROID SURGERY     pt does not remember if removed or partial removal     Social History   Tobacco Use  . Smoking status: Current Every Day Smoker    Packs/day: 1.00    Types: Cigarettes  . Smokeless tobacco: Never Used  Substance Use Topics  . Alcohol use: No  . Drug use: No    Family History  Problem Relation Age of Onset  . Hypertension Mother   . Hypertension Father   . Diabetes Sister   . Cancer Brother   . Diabetes Brother   . Breast  cancer Neg Hx      Allergies  Allergen Reactions  . Ace Inhibitors     Other reaction(s): Cough  . Atorvastatin     Other reaction(s): Muscle Pain  . Statins     Other reaction(s): Muscle Pain Muscle pain  . Sulfa Antibiotics     Stomach pains    REVIEW OF SYSTEMS (Negative unless checked)  Constitutional: [] ?Weight loss  [] ?Fever  [] ?Chills Cardiac: [] ?Chest pain   [] ?Chest pressure   [] ?Palpitations   [] ?Shortness of breath when laying flat   [] ?Shortness of breath at rest   [] ?Shortness of breath with exertion. Vascular:  [x] ?Pain in legs with walking   [] ?Pain in legs at rest   [] ?Pain in legs when laying flat   [x] ?Claudication   [] ?Pain in feet when walking  [] ?Pain in feet at rest  [] ?Pain in feet when laying flat   [] ?History of DVT   [] ?Phlebitis   [x] ?Swelling in legs   [] ?Varicose veins   [] ?Non-healing ulcers Pulmonary:   [] ?Uses home oxygen   [] ?Productive cough   [] ?Hemoptysis   [] ?Wheeze  [] ?COPD   [] ?Asthma Neurologic:  [] ?Dizziness  [] ?Blackouts   [] ?Seizures   [] ?History of stroke   [] ?History of TIA  [] ?Aphasia   [] ?Temporary blindness   [] ?Dysphagia   [] ?Weakness or numbness in arms   [] ?Weakness or numbness in legs Musculoskeletal:  [x] ?Arthritis   [] ?Joint swelling   [] ?Joint pain   [] ?Low back pain Hematologic:  [] ?Easy bruising  [] ?Easy bleeding   [] ?Hypercoagulable state   [] ?Anemic   Gastrointestinal:  [] ?Blood in stool   [] ?Vomiting blood  [] ?Gastroesophageal reflux/heartburn   [] ?Abdominal pain Genitourinary:  [] ?Chronic kidney disease   [] ?Difficult urination  [] ?Frequent urination  [] ?Burning with urination   [] ?Hematuria Skin:  [] ?Rashes   [] ?Ulcers   [] ?Wounds Psychological:  [] ?History of anxiety   [] ? History of major depression.  Physical Examination  Vitals:   04/19/19 1347 04/19/19 1348  BP: 132/71 109/73  Pulse: 67   Resp: 16   Weight: 147 lb (66.7 kg)   Height: 5\' 3"  (1.6 m)    Body mass index is 26.04 kg/m. Gen:  WD/WN, NAD Head:  [place]/AT, No temporalis wasting. Ear/Nose/Throat: Hearing grossly intact, nares w/o erythema or drainage, trachea midline Eyes: Conjunctiva clear. Sclera non-icteric Neck: Supple.  Right carotid bruit is present Pulmonary:  Good air movement, equal and clear to auscultation bilaterally.  Cardiac: RRR, No JVD Vascular:  Vessel Right Left  Radial Palpable  not palpable      DP  2+  2+  PT  2+  2+   Musculoskeletal: M/S 5/5 throughout.  No deformity or atrophy.  No edema. Neurologic: CN 2-12 intact. Sensation grossly intact in extremities.  Symmetrical.  Speech is fluent. Motor exam as listed above. Psychiatric: Judgment intact, Mood & affect appropriate for pt's clinical situation. Dermatologic: No rashes or ulcers noted.  No cellulitis or open wounds.      CBC Lab Results  Component Value Date   WBC 6.4 07/04/2017   HGB 11.9 (L) 07/04/2017   HCT 35.7 07/04/2017   MCV 91.4 07/04/2017   PLT 162 07/04/2017    BMET    Component Value Date/Time   NA 141 07/04/2017 0137   NA 142 04/01/2013 0456   K 4.6 01/11/2018 1332   K 3.6 04/01/2013 0456   CL 108 07/04/2017 0137   CL 110 (H) 04/01/2013 0456   CO2 29 07/04/2017 0137   CO2 26 04/01/2013 0456   GLUCOSE 100 (H) 07/04/2017 0137   GLUCOSE 93 04/01/2013 0456   BUN 16 01/11/2018 1332   BUN 7 04/01/2013 0456   CREATININE 1.00 07/01/2018 1302   CREATININE 0.73 04/01/2013 0456   CALCIUM 8.7 (L) 07/04/2017 0137   CALCIUM 9.0 04/01/2013 0456   GFRNONAA >60 01/11/2018 1332   GFRNONAA >60 04/01/2013 0456   GFRAA >60 01/11/2018 1332   GFRAA >60 04/01/2013 0456   CrCl cannot be calculated (Patient's most recent lab result is older than the maximum 21 days allowed.).  COAG Lab Results  Component Value Date   INR 1.07 07/03/2017   INR 1.0 03/30/2013   INR 0.9 01/02/2012    Radiology No results found.    Assessment/Plan Diabetes (HCC) blood glucose control important in reducing the progression of atherosclerotic  disease. Also, involved in wound healing. On appropriate medications.  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 Doing well after revascularization.  We will follow this on an annual basis at this point.  Carotid stenosis Velocities have increased somewhat and are now in the 60 to 79% on the right and in the 40 to 59% on the left.  No focal symptoms.  Continue current medical regimen of aspirin, Plavix, and a statin agent.  Recheck in 6 months.    Leotis Pain, MD  04/19/2019 2:33 PM    This note was created with Dragon medical transcription system.  Any errors from dictation are purely unintentional

## 2019-04-19 NOTE — Assessment & Plan Note (Signed)
Velocities have increased somewhat and are now in the 60 to 79% on the right and in the 40 to 59% on the left.  No focal symptoms.  Continue current medical regimen of aspirin, Plavix, and a statin agent.  Recheck in 6 months.

## 2019-04-19 NOTE — Assessment & Plan Note (Signed)
Her ABIs today are normal at 1.09 on the right and 1.11 on the left with triphasic waveforms and normal digital pressures bilaterally. Doing well after revascularization.  We will follow this on an annual basis at this point.

## 2019-04-21 ENCOUNTER — Other Ambulatory Visit: Payer: Medicare Other

## 2019-04-25 ENCOUNTER — Ambulatory Visit: Admission: RE | Admit: 2019-04-25 | Payer: Medicare Other | Source: Home / Self Care | Admitting: Internal Medicine

## 2019-04-25 ENCOUNTER — Encounter: Admission: RE | Payer: Self-pay | Source: Home / Self Care

## 2019-04-25 SURGERY — COLONOSCOPY WITH PROPOFOL
Anesthesia: General

## 2019-10-25 ENCOUNTER — Other Ambulatory Visit (INDEPENDENT_AMBULATORY_CARE_PROVIDER_SITE_OTHER): Payer: Self-pay | Admitting: Vascular Surgery

## 2019-10-25 ENCOUNTER — Ambulatory Visit (INDEPENDENT_AMBULATORY_CARE_PROVIDER_SITE_OTHER): Payer: Medicare Other | Admitting: Vascular Surgery

## 2019-10-25 ENCOUNTER — Encounter (INDEPENDENT_AMBULATORY_CARE_PROVIDER_SITE_OTHER): Payer: Medicare Other

## 2019-10-25 DIAGNOSIS — I6523 Occlusion and stenosis of bilateral carotid arteries: Secondary | ICD-10-CM

## 2019-11-02 ENCOUNTER — Ambulatory Visit (INDEPENDENT_AMBULATORY_CARE_PROVIDER_SITE_OTHER): Payer: Medicare Other

## 2019-11-02 ENCOUNTER — Encounter (INDEPENDENT_AMBULATORY_CARE_PROVIDER_SITE_OTHER): Payer: Self-pay | Admitting: Nurse Practitioner

## 2019-11-02 ENCOUNTER — Ambulatory Visit (INDEPENDENT_AMBULATORY_CARE_PROVIDER_SITE_OTHER): Payer: Medicare Other | Admitting: Nurse Practitioner

## 2019-11-02 ENCOUNTER — Other Ambulatory Visit: Payer: Self-pay

## 2019-11-02 VITALS — BP 132/72 | HR 84 | Resp 16 | Wt 148.0 lb

## 2019-11-02 DIAGNOSIS — I6523 Occlusion and stenosis of bilateral carotid arteries: Secondary | ICD-10-CM

## 2019-11-02 DIAGNOSIS — E539 Vitamin B deficiency, unspecified: Secondary | ICD-10-CM | POA: Diagnosis not present

## 2019-11-02 DIAGNOSIS — Z95810 Presence of automatic (implantable) cardiac defibrillator: Secondary | ICD-10-CM | POA: Insufficient documentation

## 2019-11-02 NOTE — Progress Notes (Signed)
Subjective:    Patient ID: Christine Obrien, female    DOB: 28-Jul-1940, 79 y.o.   MRN: [id number] Chief Complaint  Patient presents with  . Follow-up    ultrasound follow up    The patient is seen for follow up evaluation of carotid stenosis. The carotid stenosis followed by ultrasound.   The patient denies amaurosis fugax. There is no recent history of TIA symptoms or focal motor deficits. There is no prior documented CVA.  The patient is taking enteric-coated aspirin 81 mg daily.  There is no history of migraine headaches. There is no history of seizures.  The patient continues to smoke on a daily basis.  The patient has a history of coronary artery disease, no recent episodes of angina or shortness of breath. The patient denies PAD or claudication symptoms. There is a history of hyperlipidemia which is being treated with a statin.    The patient does complain of continued burning of the bilateral lower extremities and this has been happening for some time now, prior to her last office visit.  Carotid Duplex done today shows 80 to 99% stenosis in the right internal carotid artery with 1 to 39% stenosis in the left internal carotid artery.  The right internal carotid artery does show an increase from previous study on 04/19/2019.   Review of Systems  HENT: Positive for hearing loss.   Musculoskeletal: Positive for arthralgias and myalgias.       Objective:   Physical Exam Vitals reviewed.  HENT:     Head: Normocephalic.  Cardiovascular:     Rate and Rhythm: Normal rate and regular rhythm.     Pulses: Normal pulses.     Heart sounds: Normal heart sounds.  Pulmonary:     Effort: Pulmonary effort is normal.     Breath sounds: Normal breath sounds.  Musculoskeletal:        General: Normal range of motion.  Neurological:     Mental Status: She is alert and oriented to person, place, and time.  Psychiatric:        Mood and Affect: Mood normal.        Behavior: Behavior  normal.        Thought Content: Thought content normal.        Judgment: Judgment normal.     BP 132/72 (BP Location: Right Arm)   Pulse 84   Resp 16   Wt 148 lb (67.1 kg)   BMI 26.22 kg/m   Past Medical History:  Diagnosis Date  . Carotid artery occlusion   . Diabetes mellitus without complication ([place])   . Hypertension   . Myocardial infarction [facility])     Social History   Socioeconomic History  . Marital status: Widowed    Spouse name: Not on file  . Number of children: Not on file  . Years of education: Not on file  . Highest education level: Not on file  Occupational History  . Not on file  Tobacco Use  . Smoking status: Current Every Day Smoker    Packs/day: 1.00    Types: Cigarettes  . Smokeless tobacco: Never Used  Vaping Use  . Vaping Use: Never used  Substance and Sexual Activity  . Alcohol use: No  . Drug use: No  . Sexual activity: Not on file  Other Topics Concern  . Not on file  Social History Narrative  . Not on file   Social Determinants of Health   Financial Resource Strain:   .  Difficulty of Paying Living Expenses:   Food Insecurity:   . Worried About Charity fundraiser in the Last Year:   . Arboriculturist in the Last Year:   Transportation Needs:   . Film/video editor (Medical):   Marland Kitchen Lack of Transportation (Non-Medical):   Physical Activity:   . Days of Exercise per Week:   . Minutes of Exercise per Session:   Stress:   . Feeling of Stress :   Social Connections:   . Frequency of Communication with Friends and Family:   . Frequency of Social Gatherings with Friends and Family:   . Attends Religious Services:   . Active Member of Clubs or Organizations:   . Attends Archivist Meetings:   Marland Kitchen Marital Status:   Intimate Partner Violence:   . Fear of Current or Ex-Partner:   . Emotionally Abused:   Marland Kitchen Physically Abused:   . Sexually Abused:     Past Surgical History:  Procedure Laterality Date  . ABDOMINAL  HYSTERECTOMY     partial  . CHOLECYSTECTOMY    . CORONARY ANGIOPLASTY    . CORONARY ARTERY BYPASS GRAFT     double  . LEFT HEART CATH AND CORONARY ANGIOGRAPHY N/A 07/03/2017   Procedure: LEFT HEART CATH AND CORONARY ANGIOGRAPHY;  Surgeon: Corey Skains, MD;  Location: [place] [facility];  Service: Cardiovascular;  Laterality: N/A;  . LOWER EXTREMITY ANGIOGRAPHY Left 01/11/2018   Procedure: LOWER EXTREMITY ANGIOGRAPHY;  Surgeon: Algernon Huxley, MD;  Location: [place] [facility];  Service: Cardiovascular;  Laterality: Left;  . THYROID SURGERY     pt does not remember if removed or partial removal    Family History  Problem Relation Age of Onset  . Hypertension Mother   . Hypertension Father   . Diabetes Sister   . Cancer Brother   . Diabetes Brother   . Breast cancer Neg Hx     Allergies  Allergen Reactions  . Ace Inhibitors     Other reaction(s): Cough  . Atorvastatin     Other reaction(s): Muscle Pain  . Statins     Other reaction(s): Muscle Pain Muscle pain  . Sulfa Antibiotics     Stomach pains       Assessment & Plan:   1. Bilateral carotid artery stenosis The patient remains asymptomatic with respect to the carotid stenosis.  However, the patient has now progressed and has a lesion the is >70%.  Patient should undergo CT angiography of the carotid arteries to define the degree of stenosis of the internal carotid arteries bilaterally and the anatomic suitability for surgery vs. intervention.  If the patient does indeed need surgery cardiac clearance will be required, once cleared the patient will be scheduled for surgery.  The risks, benefits and alternative therapies were reviewed in detail with the patient.  All questions were answered.  The patient agrees to proceed with imaging.  Continue antiplatelet therapy as prescribed. Continue management of CAD, HTN and Hyperlipidemia. Healthy heart diet, encouraged exercise at least 4 times per week.   - CT ANGIO  HEAD W OR WO CONTRAST; Future - CT ANGIO NECK W OR WO CONTRAST; Future  2. Burning feet syndrome Patient describes significant burning of the bilateral lower extremities.  Patient's previous arterial studies show no evidence of extensive arterial disease that may cause this discomfort.  The patient symptoms sound suspicious for neuropathy.  Will refer the patient to neurology for further work-up. - Ambulatory  referral to Neurology   Current Outpatient Medications on File Prior to Visit  Medication Sig Dispense Refill  . Acetaminophen (TYLENOL PO) Take 500 mg by mouth.     . clopidogrel (PLAVIX) 75 MG tablet TAKE 1 TABLET BY MOUTH DAILY 30 tablet 11  . doxepin (SINEQUAN) 10 MG capsule Take by mouth. Two tablets at night    . empagliflozin (JARDIANCE) 10 MG TABS tablet Take by mouth.    . ezetimibe (ZETIA) 10 MG tablet Take by mouth.    . ferrous sulfate 325 (65 FE) MG tablet Take 325 mg by mouth.   0  . fluticasone (FLONASE) 50 MCG/ACT nasal spray Place into the nose.    Marland Kitchen Fluticasone-Salmeterol (ADVAIR DISKUS) 250-50 MCG/DOSE AEPB INHALE 1 PUFF INTO THE LUNGS TWICE DAILY    . levothyroxine (SYNTHROID) 137 MCG tablet   1  . losartan (COZAAR) 25 MG tablet Take by mouth.    . metFORMIN (GLUCOPHAGE) 500 MG tablet Take by mouth 2 (two) times daily with a meal.     . metoprolol tartrate (LOPRESSOR) 25 MG tablet Take 1 tablet (25 mg total) by mouth 2 (two) times daily. 60 tablet 0  . Multiple Vitamin (MULTIVITAMIN) capsule Take 1 capsule by mouth daily.    Marland Kitchen omega-3 acid ethyl esters (LOVAZA) 1 g capsule Take by mouth 2 (two) times daily.    Marland Kitchen omeprazole (PRILOSEC) 20 MG capsule Take 20 mg by mouth daily.     . rosuvastatin (CRESTOR) 5 MG tablet 5 mg.     . venlafaxine (EFFEXOR) 75 MG tablet Take 75 mg by mouth 3 (three) times daily with meals.    . ALPRAZolam (XANAX PO) alprazolam (Patient not taking: Reported on 11/02/2019)    . ascorbic acid (VITAMIN C) 1000 MG tablet Take by mouth. (Patient  not taking: Reported on 11/02/2019)    . aspirin EC 81 MG tablet Take 81 mg by mouth. (Patient not taking: Reported on 11/02/2019)    . buPROPion (WELLBUTRIN SR) 100 MG 12 hr tablet TAKE 1 TABLET DAILY (Patient not taking: Reported on 11/02/2019)    . cephALEXin (KEFLEX) 500 MG capsule 3 (three) times daily.  (Patient not taking: Reported on 11/02/2019)    . fidaxomicin (DIFICID) 200 MG TABS tablet Take by mouth. (Patient not taking: Reported on 11/02/2019)    . loratadine (CLARITIN) 10 MG tablet TAKE 1 TABLET BY MOUTH ONCE DAILY (Patient not taking: Reported on 11/02/2019)    . Omega-3 Fatty Acids (FISH OIL) 1000 MG CAPS Take by mouth. (Patient not taking: Reported on 11/02/2019)    . OVER THE COUNTER MEDICATION NEURIVA 1 TABLET DAILY (Patient not taking: Reported on 11/02/2019)    . predniSONE (DELTASONE) 10 MG tablet TK UTD. FOLLOW DIRECTONS ON SHEET (Patient not taking: Reported on 11/02/2019)    . Probiotic Product (PROBIOTIC DAILY PO) Take by mouth daily. (Patient not taking: Reported on 11/02/2019)     No current facility-administered medications on file prior to visit.    There are no Patient Instructions on file for this visit. No follow-ups on file.   Kris Hartmann, NP

## 2020-01-04 ENCOUNTER — Other Ambulatory Visit (INDEPENDENT_AMBULATORY_CARE_PROVIDER_SITE_OTHER): Payer: Self-pay | Admitting: Vascular Surgery

## 2020-01-09 ENCOUNTER — Other Ambulatory Visit: Payer: Self-pay

## 2020-01-09 ENCOUNTER — Other Ambulatory Visit
Admission: RE | Admit: 2020-01-09 | Discharge: 2020-01-09 | Disposition: A | Discharge status: Home / Self Care | Payer: Medicare Other | Source: Ambulatory Visit | Attending: Internal Medicine | Admitting: Internal Medicine

## 2020-01-09 DIAGNOSIS — Z01812 Encounter for preprocedural laboratory examination: Secondary | ICD-10-CM | POA: Diagnosis present

## 2020-01-09 DIAGNOSIS — Z20822 Contact with and (suspected) exposure to covid-19: Secondary | ICD-10-CM | POA: Diagnosis not present

## 2020-01-10 ENCOUNTER — Encounter: Payer: Self-pay | Admitting: Internal Medicine

## 2020-01-10 LAB — SARS CORONAVIRUS 2 (TAT 6-24 HRS): SARS Coronavirus 2: NEGATIVE

## 2020-01-11 ENCOUNTER — Ambulatory Visit
Admission: RE | Admit: 2020-01-11 | Discharge: 2020-01-11 | Disposition: A | Discharge status: Home / Self Care | Payer: Medicare Other | Attending: Internal Medicine | Admitting: Internal Medicine

## 2020-01-11 ENCOUNTER — Ambulatory Visit: Payer: Medicare Other | Admitting: Certified Registered Nurse Anesthetist

## 2020-01-11 ENCOUNTER — Encounter
Admission: RE | Disposition: A | Discharge status: Home / Self Care | Payer: Self-pay | Source: Home / Self Care | Attending: Internal Medicine

## 2020-01-11 ENCOUNTER — Encounter: Payer: Self-pay | Admitting: Internal Medicine

## 2020-01-11 ENCOUNTER — Other Ambulatory Visit: Payer: Self-pay

## 2020-01-11 DIAGNOSIS — K219 Gastro-esophageal reflux disease without esophagitis: Secondary | ICD-10-CM | POA: Insufficient documentation

## 2020-01-11 DIAGNOSIS — J449 Chronic obstructive pulmonary disease, unspecified: Secondary | ICD-10-CM | POA: Diagnosis not present

## 2020-01-11 DIAGNOSIS — Z79890 Hormone replacement therapy (postmenopausal): Secondary | ICD-10-CM | POA: Insufficient documentation

## 2020-01-11 DIAGNOSIS — I6529 Occlusion and stenosis of unspecified carotid artery: Secondary | ICD-10-CM | POA: Diagnosis not present

## 2020-01-11 DIAGNOSIS — Z951 Presence of aortocoronary bypass graft: Secondary | ICD-10-CM | POA: Insufficient documentation

## 2020-01-11 DIAGNOSIS — F17200 Nicotine dependence, unspecified, uncomplicated: Secondary | ICD-10-CM | POA: Insufficient documentation

## 2020-01-11 DIAGNOSIS — K640 First degree hemorrhoids: Secondary | ICD-10-CM | POA: Insufficient documentation

## 2020-01-11 DIAGNOSIS — I509 Heart failure, unspecified: Secondary | ICD-10-CM | POA: Insufficient documentation

## 2020-01-11 DIAGNOSIS — Z79899 Other long term (current) drug therapy: Secondary | ICD-10-CM | POA: Insufficient documentation

## 2020-01-11 DIAGNOSIS — I2510 Atherosclerotic heart disease of native coronary artery without angina pectoris: Secondary | ICD-10-CM | POA: Diagnosis not present

## 2020-01-11 DIAGNOSIS — D120 Benign neoplasm of cecum: Secondary | ICD-10-CM | POA: Diagnosis not present

## 2020-01-11 DIAGNOSIS — Z1211 Encounter for screening for malignant neoplasm of colon: Secondary | ICD-10-CM | POA: Diagnosis present

## 2020-01-11 DIAGNOSIS — Z7984 Long term (current) use of oral hypoglycemic drugs: Secondary | ICD-10-CM | POA: Diagnosis not present

## 2020-01-11 DIAGNOSIS — F329 Major depressive disorder, single episode, unspecified: Secondary | ICD-10-CM | POA: Insufficient documentation

## 2020-01-11 DIAGNOSIS — E1151 Type 2 diabetes mellitus with diabetic peripheral angiopathy without gangrene: Secondary | ICD-10-CM | POA: Diagnosis not present

## 2020-01-11 DIAGNOSIS — E039 Hypothyroidism, unspecified: Secondary | ICD-10-CM | POA: Diagnosis not present

## 2020-01-11 DIAGNOSIS — K621 Rectal polyp: Secondary | ICD-10-CM | POA: Insufficient documentation

## 2020-01-11 DIAGNOSIS — Z7951 Long term (current) use of inhaled steroids: Secondary | ICD-10-CM | POA: Insufficient documentation

## 2020-01-11 DIAGNOSIS — I110 Hypertensive heart disease with heart failure: Secondary | ICD-10-CM | POA: Insufficient documentation

## 2020-01-11 DIAGNOSIS — K5730 Diverticulosis of large intestine without perforation or abscess without bleeding: Secondary | ICD-10-CM | POA: Insufficient documentation

## 2020-01-11 DIAGNOSIS — F419 Anxiety disorder, unspecified: Secondary | ICD-10-CM | POA: Diagnosis not present

## 2020-01-11 DIAGNOSIS — Z86010 Personal history of colonic polyps: Secondary | ICD-10-CM | POA: Insufficient documentation

## 2020-01-11 DIAGNOSIS — I252 Old myocardial infarction: Secondary | ICD-10-CM | POA: Diagnosis not present

## 2020-01-11 DIAGNOSIS — Z7902 Long term (current) use of antithrombotics/antiplatelets: Secondary | ICD-10-CM | POA: Insufficient documentation

## 2020-01-11 DIAGNOSIS — Z955 Presence of coronary angioplasty implant and graft: Secondary | ICD-10-CM | POA: Insufficient documentation

## 2020-01-11 HISTORY — DX: Unspecified asthma, uncomplicated: J45.909

## 2020-01-11 HISTORY — DX: Calculus of kidney: N20.0

## 2020-01-11 HISTORY — DX: Depression, unspecified: F32.A

## 2020-01-11 HISTORY — DX: Hypothyroidism, unspecified: E03.9

## 2020-01-11 HISTORY — DX: Atherosclerotic heart disease of native coronary artery without angina pectoris: I25.10

## 2020-01-11 HISTORY — DX: Anxiety disorder, unspecified: F41.9

## 2020-01-11 HISTORY — DX: Gout, unspecified: M10.9

## 2020-01-11 HISTORY — DX: Chronic obstructive pulmonary disease, unspecified: J44.9

## 2020-01-11 HISTORY — PX: COLONOSCOPY WITH PROPOFOL: SHX5780

## 2020-01-11 HISTORY — DX: Gastro-esophageal reflux disease without esophagitis: K21.9

## 2020-01-11 HISTORY — DX: Malignant (primary) neoplasm, unspecified: C80.1

## 2020-01-11 LAB — GLUCOSE, CAPILLARY: Glucose-Capillary: 177 mg/dL — ABNORMAL HIGH (ref 70–99)

## 2020-01-11 SURGERY — COLONOSCOPY WITH PROPOFOL
Anesthesia: General

## 2020-01-11 MED ORDER — LIDOCAINE HCL (CARDIAC) PF 100 MG/5ML IV SOSY
PREFILLED_SYRINGE | INTRAVENOUS | Status: DC | PRN
  Administered 2020-01-11: 50 mg via INTRAVENOUS

## 2020-01-11 MED ORDER — PROPOFOL 10 MG/ML IV BOLUS
INTRAVENOUS | Status: DC | PRN
  Administered 2020-01-11: 70 mg via INTRAVENOUS

## 2020-01-11 MED ORDER — LIDOCAINE HCL (PF) 2 % IJ SOLN
INTRAMUSCULAR | Status: AC
  Filled 2020-01-11: qty 5

## 2020-01-11 MED ORDER — SODIUM CHLORIDE 0.9 % IV SOLN: INTRAVENOUS | Status: DC

## 2020-01-11 MED ORDER — PROPOFOL 500 MG/50ML IV EMUL
INTRAVENOUS | Status: AC
  Filled 2020-01-11: qty 50

## 2020-01-11 MED ORDER — PROPOFOL 500 MG/50ML IV EMUL
INTRAVENOUS | Status: DC | PRN
  Administered 2020-01-11: 120 ug/kg/min via INTRAVENOUS

## 2020-01-11 NOTE — Anesthesia Preprocedure Evaluation (Addendum)
Anesthesia Evaluation  Patient identified by MRN, date of birth, ID band Patient awake    Reviewed: Allergy & Precautions, H&P , NPO status , Patient's Chart, lab work & pertinent test results  History of Anesthesia Complications Negative for: history of anesthetic complications  Airway Mallampati: II  TM Distance: >3 FB     Dental  (+) Upper Dentures, Lower Dentures   Pulmonary asthma , neg sleep apnea, COPD, Current Smoker and Patient abstained from smoking.,    breath sounds clear to auscultation       Cardiovascular hypertension, (-) angina+ CAD, + Past MI, + CABG, + Peripheral Vascular Disease and +CHF (EF 25-30%)  (-) Cardiac Stents + dysrhythmias (LBBB)  Rhythm:regular Rate:Normal     Neuro/Psych PSYCHIATRIC DISORDERS Anxiety Depression negative neurological ROS     GI/Hepatic Neg liver ROS, GERD  Controlled,  Endo/Other  diabetesHypothyroidism   Renal/GU negative Renal ROS  negative genitourinary   Musculoskeletal   Abdominal   Peds  Hematology negative hematology ROS (+)   Anesthesia Other Findings Past Medical History: No date: Anxiety No date: Asthma No date: Cancer [facility])     Comment:  cervical No date: Carotid artery occlusion No date: COPD (chronic obstructive pulmonary disease) (HCC) No date: Coronary artery disease No date: Depression No date: Diabetes mellitus without complication (HCC) No date: GERD (gastroesophageal reflux disease) No date: Gout No date: Hypertension No date: Hypothyroidism No date: Kidney stones No date: Myocardial infarction [facility])  Past Surgical History: No date: ABDOMINAL HYSTERECTOMY     Comment:  partial No date: CHOLECYSTECTOMY No date: CORONARY ANGIOPLASTY No date: CORONARY ARTERY BYPASS GRAFT     Comment:  double 08/2009: EYE SURGERY; Bilateral     Comment:  cataracts 1980s: HIATAL HERNIA REPAIR 07/03/2017: LEFT HEART CATH AND CORONARY ANGIOGRAPHY; N/A      Comment:  Procedure: LEFT HEART CATH AND CORONARY ANGIOGRAPHY;                Surgeon: Corey Skains, MD;  Location: [place]               CV LAB;  Service: Cardiovascular;  Laterality: N/A; 01/11/2018: LOWER EXTREMITY ANGIOGRAPHY; Left     Comment:  Procedure: LOWER EXTREMITY ANGIOGRAPHY;  Surgeon: Algernon Huxley, MD;  Location: [place] [facility];  Service:               Cardiovascular;  Laterality: Left; No date: THYROID SURGERY     Comment:  pt does not remember if removed or partial removal  BMI    Body Mass Index: 25.69 kg/m      Reproductive/Obstetrics negative OB ROS                            Anesthesia Physical Anesthesia Plan  ASA: III  Anesthesia Plan: General   Post-op Pain Management:    Induction:   PONV Risk Score and Plan: Propofol infusion and TIVA  Airway Management Planned:   Additional Equipment:   Intra-op Plan:   Post-operative Plan:   Informed Consent: I have reviewed the patients History and Physical, chart, labs and discussed the procedure including the risks, benefits and alternatives for the proposed anesthesia with the patient or authorized representative who has indicated his/her understanding and acceptance.     Dental Advisory Given  Plan Discussed with: Anesthesiologist, CRNA and Surgeon  Anesthesia  Plan Comments:         Anesthesia Quick Evaluation

## 2020-01-11 NOTE — Anesthesia Postprocedure Evaluation (Signed)
Anesthesia Post Note  Patient: Christine Obrien  Procedure(s) Performed: COLONOSCOPY WITH PROPOFOL (N/A )  Patient location during evaluation: PACU Anesthesia Type: General Level of consciousness: awake and alert Pain management: pain level controlled Vital Signs Assessment: post-procedure vital signs reviewed and stable Respiratory status: spontaneous breathing, nonlabored ventilation and respiratory function stable Cardiovascular status: blood pressure returned to baseline and stable Postop Assessment: no apparent nausea or vomiting Anesthetic complications: no   No complications documented.   Last Vitals:  Vitals:   01/11/20 1002 01/11/20 1012  BP: 125/61 130/68  Pulse: 76 71  Resp: 16 14  Temp:    SpO2: 98% 97%    Last Pain:  Vitals:   01/11/20 1012  TempSrc:   PainSc: 0-No pain                 Brett Canales [name]

## 2020-01-11 NOTE — Transfer of Care (Signed)
Immediate Anesthesia Transfer of Care Note  Patient: Christine Obrien  Procedure(s) Performed: COLONOSCOPY WITH PROPOFOL (N/A )  Patient Location: PACU and Endoscopy Unit  Anesthesia Type:General  Level of Consciousness: drowsy  Airway & Oxygen Therapy: Patient Spontanous Breathing  Post-op Assessment: Report given to RN and Post -op Vital signs reviewed and stable  Post vital signs: Reviewed and stable  Last Vitals:  Vitals Value Taken Time  BP 121/46 01/11/20 0942  Temp    Pulse 80 01/11/20 0942  Resp 16 01/11/20 0942  SpO2 96 % 01/11/20 0942  Vitals shown include unvalidated device data.  Last Pain:  Vitals:   01/11/20 0942  TempSrc: (P) Temporal  PainSc:          Complications: No complications documented.

## 2020-01-11 NOTE — Interval H&P Note (Signed)
History and Physical Interval Note:  9/31/1216 2:44 AM  Christine Obrien  has presented today for surgery, with the diagnosis of PERSONAL HX.OF COLON POLYPS.  The various methods of treatment have been discussed with the patient and family. After consideration of risks, benefits and other options for treatment, the patient has consented to  Procedure(s): COLONOSCOPY WITH PROPOFOL (N/A) as a surgical intervention.  The patient's history has been reviewed, patient examined, no change in status, stable for surgery.  I have reviewed the patient's chart and labs.  Questions were answered to the patient's satisfaction.     [place], [place]

## 2020-01-11 NOTE — H&P (Signed)
Outpatient short stay form Pre-procedure 01/11/2020 9:07 AM Christine Obrien, M.D.  Primary Physician: [name], M.D.  Reason for visit:  Personal history of tubular adenoma of the colon (March 2014).  History of present illness:                          Patient presents for colonoscopy for a personal hx of colon polyps. The patient denies abdominal pain, abnormal weight loss or rectal bleeding.      Current Facility-Administered Medications:  .  0.9 %  sodium chloride infusion, , Intravenous, Continuous, Counce, Benay Pike, MD, Last Rate: 20 mL/hr at 01/11/20 0905, New Bag at 01/11/20 0905  Medications Prior to Admission  Medication Sig Dispense Refill Last Dose  . empagliflozin (JARDIANCE) 10 MG TABS tablet Take 10 mg by mouth daily with breakfast.   01/10/2020 at Unknown time  . ferrous sulfate 325 (65 FE) MG tablet Take 325 mg by mouth.   0 01/10/2020 at Unknown time  . Fluticasone-Salmeterol (ADVAIR DISKUS) 250-50 MCG/DOSE AEPB INHALE 1 PUFF INTO THE LUNGS TWICE DAILY   01/10/2020 at Unknown time  . glipiZIDE (GLUCOTROL) 5 MG tablet Take by mouth daily before breakfast.   01/10/2020 at Unknown time  . levothyroxine (SYNTHROID) 137 MCG tablet   1 01/10/2020 at Unknown time  . metFORMIN (GLUCOPHAGE) 500 MG tablet Take by mouth 2 (two) times daily with a meal.    01/10/2020 at Unknown time  . Multiple Vitamin (MULTIVITAMIN) capsule Take 1 capsule by mouth daily.   Past Week at Unknown time  . omega-3 acid ethyl esters (LOVAZA) 1 g capsule Take by mouth 2 (two) times daily.   Past Week at Unknown time  . omeprazole (PRILOSEC) 20 MG capsule Take 20 mg by mouth daily.    Past Week at Unknown time  . rosuvastatin (CRESTOR) 5 MG tablet 5 mg.    01/10/2020 at Unknown time  . venlafaxine (EFFEXOR) 75 MG tablet Take 75 mg by mouth 3 (three) times daily with meals.   01/10/2020 at Unknown time  . Acetaminophen (TYLENOL PO) Take 500 mg by mouth.      . ALPRAZolam (XANAX PO) alprazolam (Patient  not taking: Reported on 11/02/2019)     . ascorbic acid (VITAMIN C) 1000 MG tablet Take by mouth. (Patient not taking: Reported on 11/02/2019)     . aspirin EC 81 MG tablet Take 81 mg by mouth. (Patient not taking: Reported on 11/02/2019)     . buPROPion (WELLBUTRIN SR) 100 MG 12 hr tablet TAKE 1 TABLET DAILY (Patient not taking: Reported on 11/02/2019)     . cephALEXin (KEFLEX) 500 MG capsule 3 (three) times daily.  (Patient not taking: Reported on 11/02/2019)     . clopidogrel (PLAVIX) 75 MG tablet TAKE 1 TABLET BY MOUTH DAILY 30 tablet 11 01/06/2020  . doxepin (SINEQUAN) 10 MG capsule Take by mouth. Two tablets at night     . ezetimibe (ZETIA) 10 MG tablet Take by mouth.     . fidaxomicin (DIFICID) 200 MG TABS tablet Take by mouth. (Patient not taking: Reported on 11/02/2019)     . fluticasone (FLONASE) 50 MCG/ACT nasal spray Place into the nose.     . loratadine (CLARITIN) 10 MG tablet TAKE 1 TABLET BY MOUTH ONCE DAILY (Patient not taking: Reported on 11/02/2019)     . losartan (COZAAR) 25 MG tablet Take by mouth.     . metoprolol tartrate (LOPRESSOR) 25 MG  tablet Take 1 tablet (25 mg total) by mouth 2 (two) times daily. 60 tablet 0   . OVER THE COUNTER MEDICATION NEURIVA 1 TABLET DAILY (Patient not taking: Reported on 11/02/2019)     . predniSONE (DELTASONE) 10 MG tablet TK UTD. FOLLOW DIRECTONS ON SHEET (Patient not taking: Reported on 11/02/2019)     . Probiotic Product (PROBIOTIC DAILY PO) Take by mouth daily. (Patient not taking: Reported on 11/02/2019)        Allergies  Allergen Reactions  . Ace Inhibitors     Other reaction(s): Cough  . Atorvastatin     Other reaction(s): Muscle Pain  . Statins     Other reaction(s): Muscle Pain Muscle pain  . Sulfa Antibiotics     Stomach pains     Past Medical History:  Diagnosis Date  . Anxiety   . Asthma   . Cancer (HCC)    cervical  . Carotid artery occlusion   . COPD (chronic obstructive pulmonary disease) ([place])   . Coronary artery  disease   . Depression   . Diabetes mellitus without complication ([place])   . GERD (gastroesophageal reflux disease)   . Gout   . Hypertension   . Hypothyroidism   . Kidney stones   . Myocardial infarction [facility])     Review of systems:  Otherwise negative.    Physical Exam  Gen: Alert, oriented. Appears stated age.  HEENT: [place]/AT. PERRLA. Lungs: CTA, no wheezes. CV: RR nl S1, S2. Abd: soft, benign, no masses. BS+ Ext: No edema. Pulses 2+    Planned procedures: Proceed with colonoscopy. The patient understands the nature of the planned procedure, indications, risks, alternatives and potential complications including but not limited to bleeding, infection, perforation, damage to internal organs and possible oversedation/side effects from anesthesia. The patient agrees and gives consent to proceed.  Please refer to procedure notes for findings, recommendations and patient disposition/instructions.     Christine Obrien, M.D. Gastroenterology 01/11/2020  9:07 AM

## 2020-01-11 NOTE — Op Note (Signed)
[facility] Gastroenterology Patient Name: Christine Obrien Procedure Date: 01/11/2020 9:20 AM MRN: [id number] Account #: [id number] Date of Birth: June 30, 1940 Admit Type: Outpatient Age: 79 Room: [facility] ENDO ROOM 3 Gender: Female Note Status: Finalized Procedure:             Colonoscopy Indications:           Surveillance: Personal history of adenomatous polyps                         on last colonoscopy > 5 years ago Providers:             Lorie Apley K. Khye Hochstetler MD, MD Medicines:             Propofol per Anesthesia Complications:         No immediate complications. Procedure:             Pre-Anesthesia Assessment:                        - The risks and benefits of the procedure and the                         sedation options and risks were discussed with the                         patient. All questions were answered and informed                         consent was obtained.                        - Patient identification and proposed procedure were                         verified prior to the procedure by the nurse. The                         procedure was verified in the procedure room.                        - ASA Grade Assessment: III - A patient with severe                         systemic disease.                        - After reviewing the risks and benefits, the patient                         was deemed in satisfactory condition to undergo the                         procedure.                        After obtaining informed consent, the colonoscope was                         passed under direct vision. Throughout the procedure,  the patient's blood pressure, pulse, and oxygen                         saturations were monitored continuously. The                         Colonoscope was introduced through the anus and                         advanced to the the terminal ileum, with                         identification of the appendiceal  orifice and IC                         valve. The colonoscopy was performed without                         difficulty. The patient tolerated the procedure well.                         The quality of the bowel preparation was good. The                         ileocecal valve, appendiceal orifice, and rectum were                         photographed. Findings:      The perianal and digital rectal examinations were normal. Pertinent       negatives include normal sphincter tone and no palpable rectal lesions.      Non-bleeding internal hemorrhoids were found during retroflexion. The       hemorrhoids were Grade I (internal hemorrhoids that do not prolapse).      Multiple small and large-mouthed diverticula were found in the sigmoid       colon.      Two sessile polyps were found in the rectum and cecum. The polyps were 4       to 7 mm in size. These polyps were removed with a jumbo cold forceps.       Resection and retrieval were complete.      The terminal ileum appeared normal.      The exam was otherwise without abnormality. Impression:            - Non-bleeding internal hemorrhoids.                        - Diverticulosis in the sigmoid colon.                        - Two 4 to 7 mm polyps in the rectum and in the cecum,                         removed with a jumbo cold forceps. Resected and                         retrieved.                        - The examined portion of  the ileum was normal.                        - The examination was otherwise normal. Recommendation:        - Patient has a contact number available for                         emergencies. The signs and symptoms of potential                         delayed complications were discussed with the patient.                         Return to normal activities tomorrow. Written                         discharge instructions were provided to the patient.                        - Resume previous diet.                         - Continue present medications.                        - If polyps are benign or adenomatous without                         dysplasia, I will advise NO further colonoscopy due to                         advanced age and/or severe comorbidity.                        - Return to GI office PRN.                        - The findings and recommendations were discussed with                         the patient. Procedure Code(s):     --- Professional ---                        [phone], Colonoscopy, flexible; with biopsy, single or                         multiple Diagnosis Code(s):     --- Professional ---                        K57.30, Diverticulosis of large intestine without                         perforation or abscess without bleeding                        K63.5, Polyp of colon                        K62.1, Rectal polyp  K64.0, First degree hemorrhoids                        Z86.010, Personal history of colonic polyps CPT copyright 2019 [employer]. All rights reserved. The codes documented in this report are preliminary and upon coder review may  be revised to meet current compliance requirements. Efrain Sella MD, MD 01/11/2020 9:42:57 AM This report has been signed electronically. Number of Addenda: 0 Note Initiated On: 01/11/2020 9:20 AM Scope Withdrawal Time: 0 hours 6 minutes 19 seconds  Total Procedure Duration: 0 hours 13 minutes 54 seconds  Estimated Blood Loss:  Estimated blood loss: none. Estimated blood loss: none.      [facility]

## 2020-01-12 ENCOUNTER — Encounter: Payer: Self-pay | Admitting: Internal Medicine

## 2020-01-12 LAB — SURGICAL PATHOLOGY

## 2020-03-14 ENCOUNTER — Other Ambulatory Visit (HOSPITAL_COMMUNITY): Payer: Self-pay | Admitting: Family

## 2020-03-14 DIAGNOSIS — U071 COVID-19: Secondary | ICD-10-CM

## 2020-03-14 NOTE — Progress Notes (Signed)
I connected by phone with Christine Obrien on [id number]/1638 at 5:42 PM to discuss the potential use of a new treatment for mild to moderate COVID-19 viral infection in non-hospitalized patients.  This patient is a 79 y.o. female that meets the FDA criteria for Emergency Use Authorization of COVID monoclonal antibody casirivimab/imdevimab, bamlanivimab/eteseviamb, or sotrovimab.  Has a (+) direct SARS-CoV-2 viral test result  Has mild or moderate COVID-19   Is NOT hospitalized due to COVID-19  Is within 10 days of symptom onset  Has at least one of the high risk factor(s) for progression to severe COVID-19 and/or hospitalization as defined in EUA.  Specific high risk criteria : Older age (>/= 78 yo)   Symptoms of H/A, eye pain, aches, nausea, diarrhea, cough began 03/10/20.   I have spoken and communicated the following to the patient or parent/caregiver regarding COVID monoclonal antibody treatment:  1. FDA has authorized the emergency use for the treatment of mild to moderate COVID-19 in adults and pediatric patients with positive results of direct SARS-CoV-2 viral testing who are 56 years of age and older weighing at least 40 kg, and who are at high risk for progressing to severe COVID-19 and/or hospitalization.  2. The significant known and potential risks and benefits of COVID monoclonal antibody, and the extent to which such potential risks and benefits are unknown.  3. Information on available alternative treatments and the risks and benefits of those alternatives, including clinical trials.  4. Patients treated with COVID monoclonal antibody should continue to self-isolate and use infection control measures (e.g., wear mask, isolate, social distance, avoid sharing personal items, clean and disinfect "high touch" surfaces, and frequent handwashing) according to CDC guidelines.   5. The patient or parent/caregiver has the option to accept or refuse COVID monoclonal antibody  treatment.  After reviewing this information with the patient, the patient has agreed to receive one of the available covid 19 monoclonal antibodies and will be provided an appropriate fact sheet prior to infusion. [name], NP 03/14/2020 5:42 PM

## 2020-03-16 ENCOUNTER — Ambulatory Visit (HOSPITAL_COMMUNITY)
Admission: RE | Admit: 2020-03-16 | Discharge: 2020-03-16 | Disposition: A | Discharge status: Home / Self Care | Payer: Medicare Other | Source: Ambulatory Visit | Attending: Pulmonary Disease | Admitting: Pulmonary Disease

## 2020-03-16 ENCOUNTER — Other Ambulatory Visit (HOSPITAL_COMMUNITY): Payer: Self-pay

## 2020-03-16 DIAGNOSIS — U071 COVID-19: Secondary | ICD-10-CM | POA: Insufficient documentation

## 2020-03-16 DIAGNOSIS — Z23 Encounter for immunization: Secondary | ICD-10-CM | POA: Diagnosis not present

## 2020-03-16 MED ORDER — SODIUM CHLORIDE 0.9 % IV SOLN: INTRAVENOUS | Status: DC | PRN

## 2020-03-16 MED ORDER — FAMOTIDINE IN NACL 20-0.9 MG/50ML-% IV SOLN: 20.0000 mg | Freq: Once | INTRAVENOUS | Status: DC | PRN

## 2020-03-16 MED ORDER — SOTROVIMAB 500 MG/8ML IV SOLN
500.0000 mg | Freq: Once | INTRAVENOUS | Status: AC
  Administered 2020-03-16: 500 mg via INTRAVENOUS

## 2020-03-16 MED ORDER — DIPHENHYDRAMINE HCL 50 MG/ML IJ SOLN: 50.0000 mg | Freq: Once | INTRAMUSCULAR | Status: DC | PRN

## 2020-03-16 MED ORDER — ALBUTEROL SULFATE HFA 108 (90 BASE) MCG/ACT IN AERS: 2.0000 | INHALATION_SPRAY | Freq: Once | RESPIRATORY_TRACT | Status: DC | PRN

## 2020-03-16 MED ORDER — METHYLPREDNISOLONE SODIUM SUCC 125 MG IJ SOLR: 125.0000 mg | Freq: Once | INTRAMUSCULAR | Status: DC | PRN

## 2020-03-16 MED ORDER — EPINEPHRINE 0.3 MG/0.3ML IJ SOAJ: 0.3000 mg | Freq: Once | INTRAMUSCULAR | Status: DC | PRN

## 2020-03-16 NOTE — Discharge Instructions (Signed)

## 2020-03-16 NOTE — Progress Notes (Signed)
Diagnosis: COVID-19  Physician: Dr. Patrick Wright  Procedure: [facility] Infusion [facility] Med: Sotrovimab infusion - Provided patient with sotrovimab fact sheet for patients, parents, and caregivers prior to infusion.   Complications: No immediate complications noted  Discharge: Discharged home    

## 2020-03-16 NOTE — Progress Notes (Signed)
Patient reviewed Fact Sheet for Patients, Parents, and Caregivers for Emergency Use Authorization (EUA) of Sotrovimab for the Treatment of Coronavirus. Patient also reviewed and is agreeable to the estimated cost of treatment. Patient is agreeable to proceed.   

## 2020-03-27 ENCOUNTER — Other Ambulatory Visit: Payer: Self-pay | Admitting: Family Medicine

## 2020-03-27 DIAGNOSIS — Z1231 Encounter for screening mammogram for malignant neoplasm of breast: Secondary | ICD-10-CM

## 2020-04-03 ENCOUNTER — Ambulatory Visit
Admission: RE | Admit: 2020-04-03 | Discharge: 2020-04-03 | Disposition: A | Discharge status: Home / Self Care | Payer: Medicare Other | Source: Ambulatory Visit | Attending: Family Medicine | Admitting: Family Medicine

## 2020-04-03 ENCOUNTER — Other Ambulatory Visit: Payer: Self-pay

## 2020-04-03 DIAGNOSIS — Z1231 Encounter for screening mammogram for malignant neoplasm of breast: Secondary | ICD-10-CM | POA: Diagnosis not present

## 2020-04-11 ENCOUNTER — Other Ambulatory Visit (INDEPENDENT_AMBULATORY_CARE_PROVIDER_SITE_OTHER): Payer: Self-pay | Admitting: Vascular Surgery

## 2020-04-11 DIAGNOSIS — I739 Peripheral vascular disease, unspecified: Secondary | ICD-10-CM

## 2020-04-17 ENCOUNTER — Other Ambulatory Visit: Payer: Self-pay

## 2020-04-17 ENCOUNTER — Ambulatory Visit (INDEPENDENT_AMBULATORY_CARE_PROVIDER_SITE_OTHER): Payer: Medicare Other

## 2020-04-17 ENCOUNTER — Encounter (INDEPENDENT_AMBULATORY_CARE_PROVIDER_SITE_OTHER): Payer: Self-pay | Admitting: Nurse Practitioner

## 2020-04-17 ENCOUNTER — Ambulatory Visit (INDEPENDENT_AMBULATORY_CARE_PROVIDER_SITE_OTHER): Payer: Medicare Other | Admitting: Nurse Practitioner

## 2020-04-17 VITALS — BP 109/64 | HR 84 | Resp 16 | Wt 142.4 lb

## 2020-04-17 DIAGNOSIS — E1151 Type 2 diabetes mellitus with diabetic peripheral angiopathy without gangrene: Secondary | ICD-10-CM | POA: Diagnosis not present

## 2020-04-17 DIAGNOSIS — I70219 Atherosclerosis of native arteries of extremities with intermittent claudication, unspecified extremity: Secondary | ICD-10-CM | POA: Diagnosis not present

## 2020-04-17 DIAGNOSIS — I739 Peripheral vascular disease, unspecified: Secondary | ICD-10-CM | POA: Diagnosis not present

## 2020-04-17 DIAGNOSIS — I6523 Occlusion and stenosis of bilateral carotid arteries: Secondary | ICD-10-CM

## 2020-04-17 DIAGNOSIS — E785 Hyperlipidemia, unspecified: Secondary | ICD-10-CM

## 2020-04-30 ENCOUNTER — Encounter (INDEPENDENT_AMBULATORY_CARE_PROVIDER_SITE_OTHER): Payer: Self-pay | Admitting: Nurse Practitioner

## 2020-04-30 NOTE — Progress Notes (Signed)
Subjective:    Patient ID: Christine Obrien, female    DOB: [birth year], 80 y.o.   MRN: [id number] Chief Complaint  Patient presents with  . Follow-up    1 yr abi    The patient returns to the office for followup and review of the noninvasive studies. There have been no interval changes in lower extremity symptoms. No interval shortening of the patient's claudication distance or development of rest pain symptoms. No new ulcers or wounds have occurred since the last visit.  There have been no significant changes to the patient's overall health care.  The patient denies amaurosis fugax or recent TIA symptoms. There are no recent neurological changes noted. The patient denies history of DVT, PE or superficial thrombophlebitis. The patient denies recent episodes of angina or shortness of breath.   ABI Rt=1.07 and Lt=1.11  (previous ABI's Rt=1.09 and Lt=1.11) Duplex ultrasound of the bilateral tibial arteries reveals triphasic waveforms with good toe informs bilaterally.   Review of Systems  Cardiovascular: Negative for leg swelling.  All other systems reviewed and are negative.      Objective:   Physical Exam Vitals reviewed.  HENT:     Head: Normocephalic.  Cardiovascular:     Rate and Rhythm: Normal rate.     Pulses: Normal pulses.  Pulmonary:     Effort: Pulmonary effort is normal.  Skin:    General: Skin is warm and dry.  Neurological:     Mental Status: She is alert and oriented to person, place, and time.  Psychiatric:        Mood and Affect: Mood normal.        Behavior: Behavior normal.        Thought Content: Thought content normal.        Judgment: Judgment normal.     BP 109/64 (BP Location: Right Arm)   Pulse 84   Resp 16   Wt 142 lb 6.4 oz (64.6 kg)   BMI 25.23 kg/m   Past Medical History:  Diagnosis Date  . Anxiety   . Asthma   . Cancer (HCC)    cervical  . Carotid artery occlusion   . COPD (chronic obstructive pulmonary disease) ([place])   .  Coronary artery disease   . Depression   . Diabetes mellitus without complication ([place])   . GERD (gastroesophageal reflux disease)   . Gout   . Hypertension   . Hypothyroidism   . Kidney stones   . Myocardial infarction [facility])     Social History   Socioeconomic History  . Marital status: Widowed    Spouse name: Not on file  . Number of children: Not on file  . Years of education: Not on file  . Highest education level: Not on file  Occupational History  . Not on file  Tobacco Use  . Smoking status: Current Every Day Smoker    Packs/day: 1.00    Types: Cigarettes  . Smokeless tobacco: Never Used  Vaping Use  . Vaping Use: Never used  Substance and Sexual Activity  . Alcohol use: No  . Drug use: No  . Sexual activity: Not on file  Other Topics Concern  . Not on file  Social History Narrative  . Not on file   Social Determinants of Health   Financial Resource Strain: Not on file  Food Insecurity: Not on file  Transportation Needs: Not on file  Physical Activity: Not on file  Stress: Not on file  Social Connections:  Not on file  Intimate Partner Violence: Not on file    Past Surgical History:  Procedure Laterality Date  . ABDOMINAL HYSTERECTOMY     partial  . CHOLECYSTECTOMY    . COLONOSCOPY WITH PROPOFOL N/A 01/11/2020   Procedure: COLONOSCOPY WITH PROPOFOL;  Surgeon: Toledo, Benay Pike, MD;  Location: [facility] ENDOSCOPY;  Service: Gastroenterology;  Laterality: N/A;  . CORONARY ANGIOPLASTY    . CORONARY ARTERY BYPASS GRAFT     double  . EYE SURGERY Bilateral 08/2009   cataracts  . HIATAL HERNIA REPAIR  1980s  . LEFT HEART CATH AND CORONARY ANGIOGRAPHY N/A 07/03/2017   Procedure: LEFT HEART CATH AND CORONARY ANGIOGRAPHY;  Surgeon: Corey Skains, MD;  Location: [place] [facility];  Service: Cardiovascular;  Laterality: N/A;  . LOWER EXTREMITY ANGIOGRAPHY Left 01/11/2018   Procedure: LOWER EXTREMITY ANGIOGRAPHY;  Surgeon: Algernon Huxley, MD;  Location: [place] [facility];  Service: Cardiovascular;  Laterality: Left;  . THYROID SURGERY     pt does not remember if removed or partial removal    Family History  Problem Relation Age of Onset  . Hypertension Mother   . Hypertension Father   . Diabetes Sister   . Cancer Brother   . Diabetes Brother   . Breast cancer Neg Hx     Allergies  Allergen Reactions  . Ace Inhibitors     Other reaction(s): Cough  . Atorvastatin     Other reaction(s): Muscle Pain  . Statins     Other reaction(s): Muscle Pain Muscle pain  . Sulfa Antibiotics     Stomach pains    CBC Latest Ref Rng & Units 07/04/2017 07/03/2017 07/02/2017  WBC 3.6 - 11.0 K/uL 6.4 6.2 6.9  Hemoglobin 12.0 - 16.0 g/dL 11.9(L) 12.2 13.8  Hematocrit 35.0 - 47.0 % 35.7 36.3 40.3  Platelets 150 - 440 K/uL 162 181 217      CMP     Component Value Date/Time   NA 141 07/04/2017 0137   NA 142 04/01/2013 0456   K 4.6 01/11/2018 1332   K 3.6 04/01/2013 0456   CL 108 07/04/2017 0137   CL 110 (H) 04/01/2013 0456   CO2 29 07/04/2017 0137   CO2 26 04/01/2013 0456   GLUCOSE 100 (H) 07/04/2017 0137   GLUCOSE 93 04/01/2013 0456   BUN 16 01/11/2018 1332   BUN 7 04/01/2013 0456   CREATININE 1.00 07/01/2018 1302   CREATININE 0.73 04/01/2013 0456   CALCIUM 8.7 (L) 07/04/2017 0137   CALCIUM 9.0 04/01/2013 0456   PROT 6.7 03/30/2013 1602   ALBUMIN 3.3 (L) 03/30/2013 1602   AST 13 (L) 03/30/2013 1602   ALT 15 03/30/2013 1602   ALKPHOS 90 03/30/2013 1602   BILITOT 0.2 03/30/2013 1602   GFRNONAA >60 01/11/2018 1332   GFRNONAA >60 04/01/2013 0456   GFRAA >60 01/11/2018 1332   GFRAA >60 04/01/2013 0456     VAS [place] ABI WITH/WO TBI  Result Date: 04/24/2020 LOWER EXTREMITY DOPPLER STUDY Indications: Peripheral artery disease.  Vascular Interventions: 01/11/2018 PTA of the bilateral CIA with stents. Stent                         distal left CIA and proximal EIA. Comparison Study: 04/19/2019 Performing Technologist: Charlane Ferretti RT  (R)(VS)  Examination Guidelines: A complete evaluation includes at minimum, Doppler waveform signals and systolic blood pressure reading at the level of bilateral brachial, anterior tibial, and posterior tibial  arteries, when vessel segments are accessible. Bilateral testing is considered an integral part of a complete examination. Photoelectric Plethysmograph (PPG) waveforms and toe systolic pressure readings are included as required and additional duplex testing as needed. Limited examinations for reoccurring indications may be performed as noted.  ABI Findings: +---------+------------------+-----+---------+--------+ Right    Rt Pressure (mmHg)IndexWaveform Comment  +---------+------------------+-----+---------+--------+ Brachial 143                                      +---------+------------------+-----+---------+--------+ ATA      143               1.00 triphasic         +---------+------------------+-----+---------+--------+ PTA      153               1.07 triphasic         +---------+------------------+-----+---------+--------+ Great Toe80                0.56 Dampened          +---------+------------------+-----+---------+--------+ +---------+------------------+-----+---------+--------------+ Left     Lt Pressure (mmHg)IndexWaveform Comment        +---------+------------------+-----+---------+--------------+ Brachial 111                             Left SCA steal +---------+------------------+-----+---------+--------------+ ATA      125               0.87 triphasic               +---------+------------------+-----+---------+--------------+ PTA      159               1.11 triphasic               +---------+------------------+-----+---------+--------------+ Great Toe110               0.77 Dampened                +---------+------------------+-----+---------+--------------+ +-------+-----------+-----------+------------+------------+ ABI/TBIToday's  ABIToday's TBIPrevious ABIPrevious TBI +-------+-----------+-----------+------------+------------+ Right  1.07       .56        1.09        .76          +-------+-----------+-----------+------------+------------+ Left   1.11       .77        1.11        .82          +-------+-----------+-----------+------------+------------+ Bilateral ABIs appear essentially unchanged compared to prior study on 04/19/2019.  Summary: Right: Resting right ankle-brachial index is within normal range. No evidence of significant right lower extremity arterial disease. The right toe-brachial index is abnormal. Right TBI appears decreased as compared to the previous exam on 04/19/2019. Left: Resting left ankle-brachial index is within normal range. No evidence of significant left lower extremity arterial disease. The left toe-brachial index is normal. Left TBI appears essentially unchanged as compared to the previous exam on 04/19/2019. Left vertebral artery demonstrates bidirectional flow.  *See table(s) above for measurements and observations.  Electronically signed by Leotis Pain MD on 04/24/2020 at 8:46:21 AM.    Final        Assessment & Plan:   1. Atherosclerotic peripheral vascular disease with intermittent claudication (HCC)  Recommend:  The patient has evidence of atherosclerosis of the lower extremities with claudication.  The patient does not voice lifestyle limiting changes at this point  in time.  Noninvasive studies do not suggest clinically significant change.  No invasive studies, angiography or surgery at this time The patient should continue walking and begin a more formal exercise program.  The patient should continue antiplatelet therapy and aggressive treatment of the lipid abnormalities  No changes in the patient's medications at this time  The patient should continue wearing graduated compression socks 10-15 mmHg strength to control the mild edema.   - VAS [place] ABI WITH/WO TBI;  Future  2. Type 2 diabetes mellitus with diabetic peripheral angiopathy without gangrene, unspecified whether long term insulin use ([place]) Continue hypoglycemic medications as already ordered, these medications have been reviewed and there are no changes at this time.  Hgb A1C to be monitored as already arranged by primary service   3. Hyperlipidemia, unspecified hyperlipidemia type Continue statin as ordered and reviewed, no changes at this time    Current Outpatient Medications on File Prior to Visit  Medication Sig Dispense Refill  . Acetaminophen (TYLENOL PO) Take 500 mg by mouth.     Marland Kitchen apixaban (ELIQUIS) 5 MG TABS tablet Take by mouth.    Marland Kitchen buPROPion (WELLBUTRIN SR) 100 MG 12 hr tablet TAKE 1 TABLET DAILY    . doxepin (SINEQUAN) 10 MG capsule Take by mouth. Two tablets at night    . empagliflozin (JARDIANCE) 10 MG TABS tablet Take 10 mg by mouth daily with breakfast.    . ezetimibe (ZETIA) 10 MG tablet Take by mouth.    . ferrous sulfate 325 (65 FE) MG tablet Take 325 mg by mouth.   0  . fluticasone (FLONASE) 50 MCG/ACT nasal spray Place into the nose.    Marland Kitchen Fluticasone-Salmeterol (ADVAIR) 250-50 MCG/DOSE AEPB INHALE 1 PUFF INTO THE LUNGS TWICE DAILY    . glipiZIDE (GLUCOTROL) 5 MG tablet Take by mouth daily before breakfast.    . levothyroxine (SYNTHROID) 137 MCG tablet   1  . loratadine (CLARITIN) 10 MG tablet TAKE 1 TABLET BY MOUTH ONCE DAILY    . losartan (COZAAR) 25 MG tablet Take by mouth.    . metFORMIN (GLUCOPHAGE) 500 MG tablet Take by mouth 2 (two) times daily with a meal.     . metoprolol tartrate (LOPRESSOR) 25 MG tablet Take 1 tablet (25 mg total) by mouth 2 (two) times daily. 60 tablet 0  . Multiple Vitamin (MULTIVITAMIN) capsule Take 1 capsule by mouth daily.    Marland Kitchen omega-3 acid ethyl esters (LOVAZA) 1 g capsule Take by mouth 2 (two) times daily.    Marland Kitchen omeprazole (PRILOSEC) 20 MG capsule Take 20 mg by mouth daily.     . rosuvastatin (CRESTOR) 5 MG tablet 5 mg.     .  venlafaxine (EFFEXOR) 75 MG tablet Take 75 mg by mouth 3 (three) times daily with meals.    . ALPRAZolam (XANAX PO) alprazolam (Patient not taking: No sig reported)    . ascorbic acid (VITAMIN C) 1000 MG tablet Take by mouth. (Patient not taking: No sig reported)    . aspirin EC 81 MG tablet Take 81 mg by mouth. (Patient not taking: No sig reported)    . cephALEXin (KEFLEX) 500 MG capsule 3 (three) times daily.  (Patient not taking: No sig reported)    . clopidogrel (PLAVIX) 75 MG tablet TAKE 1 TABLET BY MOUTH DAILY (Patient not taking: Reported on 04/17/2020) 30 tablet 11  . fidaxomicin (DIFICID) 200 MG TABS tablet Take by mouth. (Patient not taking: No sig reported)    . OVER  THE COUNTER MEDICATION NEURIVA 1 TABLET DAILY (Patient not taking: No sig reported)    . predniSONE (DELTASONE) 10 MG tablet TK UTD. FOLLOW [employer] ON SHEET (Patient not taking: No sig reported)    . Probiotic Product (PROBIOTIC DAILY PO) Take by mouth daily. (Patient not taking: No sig reported)     No current facility-administered medications on file prior to visit.    There are no Patient Instructions on file for this visit. No follow-ups on file.   Kris Hartmann, NP

## 2020-05-07 ENCOUNTER — Inpatient Hospital Stay: Admission: RE | Admit: 2020-05-07 | Payer: Medicare Other | Source: Ambulatory Visit

## 2020-05-07 ENCOUNTER — Ambulatory Visit: Admission: RE | Admit: 2020-05-07 | Payer: Medicare Other | Source: Ambulatory Visit

## 2020-05-28 ENCOUNTER — Ambulatory Visit: Payer: Medicare Other

## 2020-05-28 ENCOUNTER — Ambulatory Visit
Admission: RE | Admit: 2020-05-28 | Discharge: 2020-05-28 | Disposition: A | Discharge status: Home / Self Care | Payer: Medicare Other | Source: Ambulatory Visit | Attending: Nurse Practitioner | Admitting: Nurse Practitioner

## 2020-05-28 ENCOUNTER — Other Ambulatory Visit: Payer: Self-pay

## 2020-05-28 DIAGNOSIS — I6523 Occlusion and stenosis of bilateral carotid arteries: Secondary | ICD-10-CM | POA: Diagnosis not present

## 2020-05-28 LAB — POCT I-STAT CREATININE: Creatinine, Ser: 1.1 mg/dL — ABNORMAL HIGH (ref 0.44–1.00)

## 2020-05-28 MED ORDER — IOHEXOL 350 MG/ML SOLN
75.0000 mL | Freq: Once | INTRAVENOUS | Status: AC | PRN
  Administered 2020-05-28: 75 mL via INTRAVENOUS

## 2020-05-28 NOTE — Progress Notes (Signed)
Please bring patient in for CT scan review with JD

## 2020-06-13 ENCOUNTER — Encounter (INDEPENDENT_AMBULATORY_CARE_PROVIDER_SITE_OTHER): Payer: Self-pay | Admitting: Nurse Practitioner

## 2020-06-13 ENCOUNTER — Ambulatory Visit (INDEPENDENT_AMBULATORY_CARE_PROVIDER_SITE_OTHER): Payer: Medicare Other | Admitting: Nurse Practitioner

## 2020-06-13 ENCOUNTER — Ambulatory Visit (INDEPENDENT_AMBULATORY_CARE_PROVIDER_SITE_OTHER): Payer: Medicare Other

## 2020-06-13 ENCOUNTER — Other Ambulatory Visit: Payer: Self-pay

## 2020-06-13 VITALS — BP 134/69 | HR 87 | Ht 63.0 in | Wt 144.0 lb

## 2020-06-13 DIAGNOSIS — I70219 Atherosclerosis of native arteries of extremities with intermittent claudication, unspecified extremity: Secondary | ICD-10-CM

## 2020-06-13 DIAGNOSIS — I6523 Occlusion and stenosis of bilateral carotid arteries: Secondary | ICD-10-CM | POA: Diagnosis not present

## 2020-06-17 ENCOUNTER — Encounter (INDEPENDENT_AMBULATORY_CARE_PROVIDER_SITE_OTHER): Payer: Self-pay | Admitting: Nurse Practitioner

## 2020-06-17 NOTE — Progress Notes (Signed)
Subjective:    Patient ID: Christine Obrien, female    DOB: [birth year], 80 y.o.   MRN: [id number] Chief Complaint  Patient presents with  . Follow-up    1 Mo U/S     Christine Obrien is a 80 year old female that presents today for evaluation of noninvasive studies.  The patient has a previous history of PAD and carotid artery stenosis.  She denies any worsening claudication-like symptoms, rest pain or ulceration.  Overall the patient has been doing well.  She denies any amaurosis fugax or TIA-like symptoms.  She denies any pain or discomfort when using either upper extremity.  Today noninvasive studies show a right ABI 1.07 with a left 1.05.  These were improved from previous studies done on 03/2020   Review of Systems  Eyes: Negative for visual disturbance.  Cardiovascular: Negative for leg swelling.  Hematological: Does not bruise/bleed easily.  All other systems reviewed and are negative.      Objective:   Physical Exam Vitals reviewed.  HENT:     Head: Normocephalic.  Cardiovascular:     Rate and Rhythm: Normal rate.     Pulses: Normal pulses.  Pulmonary:     Effort: Pulmonary effort is normal.  Neurological:     Mental Status: She is alert and oriented to person, place, and time.  Psychiatric:        Mood and Affect: Mood normal.        Behavior: Behavior normal.        Thought Content: Thought content normal.        Judgment: Judgment normal.     BP 134/69   Pulse 87   Ht 5\' 3"  (1.6 m)   Wt 144 lb (65.3 kg)   BMI 25.51 kg/m   Past Medical History:  Diagnosis Date  . Anxiety   . Asthma   . Cancer (HCC)    cervical  . Carotid artery occlusion   . COPD (chronic obstructive pulmonary disease) ([place])   . Coronary artery disease   . Depression   . Diabetes mellitus without complication ([place])   . GERD (gastroesophageal reflux disease)   . Gout   . Hypertension   . Hypothyroidism   . Kidney stones   . Myocardial infarction [facility])     Social History    Socioeconomic History  . Marital status: Widowed    Spouse name: Not on file  . Number of children: Not on file  . Years of education: Not on file  . Highest education level: Not on file  Occupational History  . Not on file  Tobacco Use  . Smoking status: Current Every Day Smoker    Packs/day: 1.00    Types: Cigarettes  . Smokeless tobacco: Never Used  Vaping Use  . Vaping Use: Never used  Substance and Sexual Activity  . Alcohol use: No  . Drug use: No  . Sexual activity: Not on file  Other Topics Concern  . Not on file  Social History Narrative  . Not on file   Social Determinants of Health   Financial Resource Strain: Not on file  Food Insecurity: Not on file  Transportation Needs: Not on file  Physical Activity: Not on file  Stress: Not on file  Social Connections: Not on file  Intimate Partner Violence: Not on file    Past Surgical History:  Procedure Laterality Date  . ABDOMINAL HYSTERECTOMY     partial  . CHOLECYSTECTOMY    . COLONOSCOPY WITH  PROPOFOL N/A 01/11/2020   Procedure: COLONOSCOPY WITH PROPOFOL;  Surgeon: Toledo, Benay Pike, MD;  Location: [facility] ENDOSCOPY;  Service: Gastroenterology;  Laterality: N/A;  . CORONARY ANGIOPLASTY    . CORONARY ARTERY BYPASS GRAFT     double  . EYE SURGERY Bilateral 08/2009   cataracts  . HIATAL HERNIA REPAIR  1980s  . LEFT HEART CATH AND CORONARY ANGIOGRAPHY N/A 07/03/2017   Procedure: LEFT HEART CATH AND CORONARY ANGIOGRAPHY;  Surgeon: Corey Skains, MD;  Location: [place] [facility];  Service: Cardiovascular;  Laterality: N/A;  . LOWER EXTREMITY ANGIOGRAPHY Left 01/11/2018   Procedure: LOWER EXTREMITY ANGIOGRAPHY;  Surgeon: Algernon Huxley, MD;  Location: [place] [facility];  Service: Cardiovascular;  Laterality: Left;  . THYROID SURGERY     pt does not remember if removed or partial removal    Family History  Problem Relation Age of Onset  . Hypertension Mother   . Hypertension Father   . Diabetes Sister    . Cancer Brother   . Diabetes Brother   . Breast cancer Neg Hx     Allergies  Allergen Reactions  . Ace Inhibitors     Other reaction(s): Cough  . Atorvastatin     Other reaction(s): Muscle Pain  . Statins     Other reaction(s): Muscle Pain Muscle pain  . Sulfa Antibiotics     Stomach pains    CBC Latest Ref Rng & Units 07/04/2017 07/03/2017 07/02/2017  WBC 3.6 - 11.0 K/uL 6.4 6.2 6.9  Hemoglobin 12.0 - 16.0 g/dL 11.9(L) 12.2 13.8  Hematocrit 35.0 - 47.0 % 35.7 36.3 40.3  Platelets 150 - 440 K/uL 162 181 217      CMP     Component Value Date/Time   NA 141 07/04/2017 0137   NA 142 04/01/2013 0456   K 4.6 01/11/2018 1332   K 3.6 04/01/2013 0456   CL 108 07/04/2017 0137   CL 110 (H) 04/01/2013 0456   CO2 29 07/04/2017 0137   CO2 26 04/01/2013 0456   GLUCOSE 100 (H) 07/04/2017 0137   GLUCOSE 93 04/01/2013 0456   BUN 16 01/11/2018 1332   BUN 7 04/01/2013 0456   CREATININE 1.10 (H) 05/28/2020 1400   CREATININE 0.73 04/01/2013 0456   CALCIUM 8.7 (L) 07/04/2017 0137   CALCIUM 9.0 04/01/2013 0456   PROT 6.7 03/30/2013 1602   ALBUMIN 3.3 (L) 03/30/2013 1602   AST 13 (L) 03/30/2013 1602   ALT 15 03/30/2013 1602   ALKPHOS 90 03/30/2013 1602   BILITOT 0.2 03/30/2013 1602   GFRNONAA >60 01/11/2018 1332   GFRNONAA >60 04/01/2013 0456   GFRAA >60 01/11/2018 1332   GFRAA >60 04/01/2013 0456     VAS [place] ABI WITH/WO TBI  Result Date: 06/15/2020 LOWER EXTREMITY DOPPLER STUDY Indications: Peripheral artery disease.  Vascular Interventions: 01/11/2018 PTA of the bilateral CIA with stents. Stent                         distal left CIA and proximal EIA. Performing Technologist: Concha Norway RVT  Examination Guidelines: A complete evaluation includes at minimum, Doppler waveform signals and systolic blood pressure reading at the level of bilateral brachial, anterior tibial, and posterior tibial arteries, when vessel segments are accessible. Bilateral testing is considered an integral  part of a complete examination. Photoelectric Plethysmograph (PPG) waveforms and toe systolic pressure readings are included as required and additional duplex testing as needed. Limited examinations for reoccurring indications  may be performed as noted.  ABI Findings: +---------+------------------+-----+---------+--------+ Right    Rt Pressure (mmHg)IndexWaveform Comment  +---------+------------------+-----+---------+--------+ Brachial 153                                      +---------+------------------+-----+---------+--------+ ATA      164               1.07 triphasic         +---------+------------------+-----+---------+--------+ PTA      161               1.05 triphasic         +---------+------------------+-----+---------+--------+ Great Toe121               0.79 Normal            +---------+------------------+-----+---------+--------+ +---------+------------------+-----+---------+-------+ Left     Lt Pressure (mmHg)IndexWaveform Comment +---------+------------------+-----+---------+-------+ ATA      159               1.04 triphasic        +---------+------------------+-----+---------+-------+ PTA      161               1.05 triphasic        +---------+------------------+-----+---------+-------+ Great Toe125               0.82 Normal           +---------+------------------+-----+---------+-------+ +-------+-----------+-----------+------------+------------+ ABI/TBIToday's ABIToday's TBIPrevious ABIPrevious TBI +-------+-----------+-----------+------------+------------+ Right  1.07       .79        1.07        .56          +-------+-----------+-----------+------------+------------+ Left   1.05       .82        1.11        .77          +-------+-----------+-----------+------------+------------+ Right TBIs appear increased compared to prior study on 03/2020.  Summary: Right: Resting right ankle-brachial index is within normal range. No  evidence of significant right lower extremity arterial disease. The right toe-brachial index is normal. Left: Resting left ankle-brachial index is within normal range. No evidence of significant left lower extremity arterial disease. The left toe-brachial index is normal.  *See table(s) above for measurements and observations.  Electronically signed by Leotis Pain MD on 06/15/2020 at 11:58:35 AM.    Final        Assessment & Plan:   1. Atherosclerotic peripheral vascular disease with intermittent claudication (HCC)  Recommend:  The patient has evidence of atherosclerosis of the lower extremities with claudication.  The patient does not voice lifestyle limiting changes at this point in time.  Noninvasive studies do not suggest clinically significant change.  No invasive studies, angiography or surgery at this time The patient should continue walking and begin a more formal exercise program.  The patient should continue antiplatelet therapy and aggressive treatment of the lipid abnormalities  No changes in the patient's medications at this time  The patient should continue wearing graduated compression socks 10-15 mmHg strength to control the mild edema.    2. Bilateral carotid artery stenosis Patient had a recent CT angiogram of her carotid artery.  She does have evidence of a high-grade stenosis of the right ICA.  We will have the patient follow-up soon to discuss next steps with Dr. Lucky Cowboy.   Current Outpatient Medications on File Prior to Visit  Medication  Sig Dispense Refill  . Acetaminophen (TYLENOL PO) Take 500 mg by mouth.     . ALPRAZolam (XANAX PO) alprazolam    . apixaban (ELIQUIS) 5 MG TABS tablet Take by mouth.    Marland Kitchen ascorbic acid (VITAMIN C) 1000 MG tablet Take by mouth.    Marland Kitchen aspirin EC 81 MG tablet Take 81 mg by mouth.    Marland Kitchen buPROPion (WELLBUTRIN SR) 100 MG 12 hr tablet TAKE 1 TABLET DAILY    . cephALEXin (KEFLEX) 500 MG capsule 3 (three) times daily.    . clopidogrel (PLAVIX)  75 MG tablet TAKE 1 TABLET BY MOUTH DAILY 30 tablet 11  . empagliflozin (JARDIANCE) 10 MG TABS tablet Take 10 mg by mouth daily with breakfast.    . ferrous sulfate 325 (65 FE) MG tablet Take 325 mg by mouth.   0  . fidaxomicin (DIFICID) 200 MG TABS tablet Take by mouth.    . fluticasone (FLONASE) 50 MCG/ACT nasal spray Place into the nose.    Marland Kitchen Fluticasone-Salmeterol (ADVAIR) 250-50 MCG/DOSE AEPB INHALE 1 PUFF INTO THE LUNGS TWICE DAILY    . glipiZIDE (GLUCOTROL) 5 MG tablet Take by mouth daily before breakfast.    . levothyroxine (SYNTHROID) 137 MCG tablet   1  . loratadine (CLARITIN) 10 MG tablet TAKE 1 TABLET BY MOUTH ONCE DAILY    . metFORMIN (GLUCOPHAGE) 500 MG tablet Take by mouth 2 (two) times daily with a meal.     . metoprolol tartrate (LOPRESSOR) 25 MG tablet Take 1 tablet (25 mg total) by mouth 2 (two) times daily. 60 tablet 0  . Multiple Vitamin (MULTIVITAMIN) capsule Take 1 capsule by mouth daily.    Marland Kitchen omega-3 acid ethyl esters (LOVAZA) 1 g capsule Take by mouth 2 (two) times daily.    Marland Kitchen omeprazole (PRILOSEC) 20 MG capsule Take 20 mg by mouth daily.     Marland Kitchen OVER THE COUNTER MEDICATION NEURIVA 1 TABLET DAILY    . predniSONE (DELTASONE) 10 MG tablet TK UTD. FOLLOW DIRECTONS ON SHEET    . Probiotic Product (PROBIOTIC DAILY PO) Take by mouth daily.    . rosuvastatin (CRESTOR) 5 MG tablet 5 mg.     . venlafaxine (EFFEXOR) 75 MG tablet Take 75 mg by mouth 3 (three) times daily with meals.    Marland Kitchen doxepin (SINEQUAN) 10 MG capsule Take by mouth. Two tablets at night    . ezetimibe (ZETIA) 10 MG tablet Take by mouth.    . losartan (COZAAR) 25 MG tablet Take by mouth.     No current facility-administered medications on file prior to visit.    There are no Patient Instructions on file for this visit. No follow-ups on file.   Kris Hartmann, NP

## 2020-06-19 ENCOUNTER — Encounter (INDEPENDENT_AMBULATORY_CARE_PROVIDER_SITE_OTHER): Payer: Self-pay | Admitting: Vascular Surgery

## 2020-06-19 ENCOUNTER — Other Ambulatory Visit: Payer: Self-pay

## 2020-06-19 ENCOUNTER — Ambulatory Visit (INDEPENDENT_AMBULATORY_CARE_PROVIDER_SITE_OTHER): Payer: Medicare Other | Admitting: Vascular Surgery

## 2020-06-19 VITALS — BP 115/71 | HR 88 | Resp 16 | Wt 146.0 lb

## 2020-06-19 DIAGNOSIS — E785 Hyperlipidemia, unspecified: Secondary | ICD-10-CM

## 2020-06-19 DIAGNOSIS — G458 Other transient cerebral ischemic attacks and related syndromes: Secondary | ICD-10-CM

## 2020-06-19 DIAGNOSIS — E1151 Type 2 diabetes mellitus with diabetic peripheral angiopathy without gangrene: Secondary | ICD-10-CM | POA: Diagnosis not present

## 2020-06-19 DIAGNOSIS — I6523 Occlusion and stenosis of bilateral carotid arteries: Secondary | ICD-10-CM | POA: Diagnosis not present

## 2020-06-19 DIAGNOSIS — I743 Embolism and thrombosis of arteries of the lower extremities: Secondary | ICD-10-CM | POA: Diagnosis not present

## 2020-06-19 NOTE — Assessment & Plan Note (Signed)
lipid control important in reducing the progression of atherosclerotic disease. Continue statin therapy  

## 2020-06-19 NOTE — Progress Notes (Signed)
MRN : [id number]  Christine Obrien is a 80 y.o. (01-29-1941) female who presents with chief complaint of  Chief Complaint  Patient presents with  . Follow-up    Ct results  .  History of Present Illness: Patient returns today in follow up of her carotid disease.  She has recently undergone lower extremity arterial studies which showed good flow.  She is several years status post treatment for lower extremity ischemia and has done well from this. As for her carotid disease, she does have significant dizziness and lightheadedness.  She has not had any obvious focal neurologic symptoms since her last visit.  She has undergone a CT angiogram which I have independently reviewed.  The official report is of a 75 to 80% right carotid artery stenosis but it appears worse than that to me.  The left carotid stenosis is estimated in the 55% range which seems reasonable.  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  Current Outpatient Medications  Medication Sig Dispense Refill  . Acetaminophen (TYLENOL PO) Take 500 mg by mouth.     . ALPRAZolam (XANAX PO) alprazolam    . apixaban (ELIQUIS) 5 MG TABS tablet Take by mouth.    Marland Kitchen ascorbic acid (VITAMIN C) 1000 MG tablet Take by mouth.    Marland Kitchen aspirin EC 81 MG tablet Take 81 mg by mouth.    Marland Kitchen buPROPion (WELLBUTRIN SR) 100 MG 12 hr tablet TAKE 1 TABLET DAILY    . cephALEXin (KEFLEX) 500 MG capsule 3 (three) times daily.    . clopidogrel (PLAVIX) 75 MG tablet TAKE 1 TABLET BY MOUTH DAILY 30 tablet 11  . empagliflozin (JARDIANCE) 10 MG TABS tablet Take 10 mg by mouth daily with breakfast.    . ferrous sulfate 325 (65 FE) MG tablet Take 325 mg by mouth.   0  . fidaxomicin (DIFICID) 200 MG TABS tablet Take by mouth.    . fluticasone (FLONASE) 50 MCG/ACT nasal spray Place into the nose.    Marland Kitchen Fluticasone-Salmeterol (ADVAIR) 250-50 MCG/DOSE AEPB INHALE 1 PUFF INTO THE LUNGS TWICE DAILY    . glipiZIDE (GLUCOTROL) 5 MG tablet Take by  mouth daily before breakfast.    . levothyroxine (SYNTHROID) 137 MCG tablet   1  . loratadine (CLARITIN) 10 MG tablet TAKE 1 TABLET BY MOUTH ONCE DAILY    . metFORMIN (GLUCOPHAGE) 500 MG tablet Take by mouth 2 (two) times daily with a meal.     . metoprolol tartrate (LOPRESSOR) 25 MG tablet Take 1 tablet (25 mg total) by mouth 2 (two) times daily. 60 tablet 0  . Multiple Vitamin (MULTIVITAMIN) capsule Take 1 capsule by mouth daily.    Marland Kitchen omega-3 acid ethyl esters (LOVAZA) 1 g capsule Take by mouth 2 (two) times daily.    Marland Kitchen omeprazole (PRILOSEC) 20 MG capsule Take 20 mg by mouth daily.     Marland Kitchen OVER THE COUNTER MEDICATION NEURIVA 1 TABLET DAILY    . predniSONE (DELTASONE) 10 MG tablet TK UTD. FOLLOW DIRECTONS ON SHEET    . Probiotic Product (PROBIOTIC DAILY PO) Take by mouth daily.    . rosuvastatin (CRESTOR) 5 MG tablet 5 mg.     . venlafaxine (EFFEXOR) 75 MG tablet Take 75 mg by mouth 3 (three) times daily with meals.    Marland Kitchen doxepin (SINEQUAN) 10 MG capsule Take by mouth. Two tablets at night    . ezetimibe (ZETIA) 10 MG tablet Take by mouth.    Marland Kitchen  losartan (COZAAR) 25 MG tablet Take by mouth.     No current facility-administered medications for this visit.    Past Medical History:  Diagnosis Date  . Anxiety   . Asthma   . Cancer (HCC)    cervical  . Carotid artery occlusion   . COPD (chronic obstructive pulmonary disease) ([place])   . Coronary artery disease   . Depression   . Diabetes mellitus without complication ([place])   . GERD (gastroesophageal reflux disease)   . Gout   . Hypertension   . Hypothyroidism   . Kidney stones   . Myocardial infarction [facility])     Past Surgical History:  Procedure Laterality Date  . ABDOMINAL HYSTERECTOMY     partial  . CHOLECYSTECTOMY    . COLONOSCOPY WITH PROPOFOL N/A 01/11/2020   Procedure: COLONOSCOPY WITH PROPOFOL;  Surgeon: Toledo, Benay Pike, MD;  Location: [facility] ENDOSCOPY;  Service: Gastroenterology;  Laterality: N/A;  . CORONARY ANGIOPLASTY     . CORONARY ARTERY BYPASS GRAFT     double  . EYE SURGERY Bilateral 08/2009   cataracts  . HIATAL HERNIA REPAIR  1980s  . LEFT HEART CATH AND CORONARY ANGIOGRAPHY N/A 07/03/2017   Procedure: LEFT HEART CATH AND CORONARY ANGIOGRAPHY;  Surgeon: Corey Skains, MD;  Location: [place] [facility];  Service: Cardiovascular;  Laterality: N/A;  . LOWER EXTREMITY ANGIOGRAPHY Left 01/11/2018   Procedure: LOWER EXTREMITY ANGIOGRAPHY;  Surgeon: Algernon Huxley, MD;  Location: [place] [facility];  Service: Cardiovascular;  Laterality: Left;  . THYROID SURGERY     pt does not remember if removed or partial removal     Social History   Tobacco Use  . Smoking status: Current Every Day Smoker    Packs/day: 1.00    Types: Cigarettes  . Smokeless tobacco: Never Used  Vaping Use  . Vaping Use: Never used  Substance Use Topics  . Alcohol use: No  . Drug use: No      Family History  Problem Relation Age of Onset  . Hypertension Mother   . Hypertension Father   . Diabetes Sister   . Cancer Brother   . Diabetes Brother   . Breast cancer Neg Hx      Allergies  Allergen Reactions  . Ace Inhibitors     Other reaction(s): Cough  . Atorvastatin     Other reaction(s): Muscle Pain  . Statins     Other reaction(s): Muscle Pain Muscle pain  . Sulfa Antibiotics     Stomach pains    REVIEW OF SYSTEMS(Negative unless checked)  Constitutional: [] ??Weight loss [] ??Fever [] ??Chills Cardiac: [] ??Chest pain [] ??Chest pressure [] ??Palpitations [] ??Shortness of breath when laying flat [] ??Shortness of breath at rest [] ??Shortness of breath with exertion. Vascular: [x] ??Pain in legs with walking [] ??Pain in legs at rest [] ??Pain in legs when laying flat [x] ??Claudication [] ??Pain in feet when walking [] ??Pain in feet at rest [] ??Pain in feet when laying flat [] ??History of DVT [] ??Phlebitis [x] ??Swelling in legs [] ??Varicose veins [] ??Non-healing ulcers Pulmonary:  [] ??Uses home oxygen [] ??Productive cough [] ??Hemoptysis [] ??Wheeze [] ??COPD [] ??Asthma Neurologic: [x] ??Dizziness [] ??Blackouts [] ??Seizures [] ??History of stroke [] ??History of TIA [] ??Aphasia [] ??Temporary blindness [] ??Dysphagia [] ??Weakness or numbness in arms [] ??Weakness or numbness in legs Musculoskeletal: [x] ??Arthritis [] ??Joint swelling [] ??Joint pain [] ??Low back pain Hematologic: [] ??Easy bruising [] ??Easy bleeding [] ??Hypercoagulable state [] ??Anemic  Gastrointestinal: [] ??Blood in stool [] ??Vomiting blood [] ??Gastroesophageal reflux/heartburn [] ??Abdominal pain Genitourinary: [] ??Chronic kidney disease [] ??Difficult urination [] ??Frequent urination [] ??Burning with urination [] ??Hematuria Skin: [] ??Rashes [] ??Ulcers [] ??Wounds Psychological: [] ??History of anxiety [] ??History of  major depression.  Physical Examination  BP 115/71 (BP Location: Right Arm)   Pulse 88   Resp 16   Wt 146 lb (66.2 kg)   BMI 25.86 kg/m  Gen:  WD/WN, NAD. Appears younger than stated age. Head: [place]/AT, No temporalis wasting. Ear/Nose/Throat: Hearing somewhat diminished, nares w/o erythema or drainage Eyes: Conjunctiva clear. Sclera non-icteric Neck: Supple.  Trachea midline Pulmonary:  Good air movement, no use of accessory muscles.  Cardiac: RRR, no JVD Vascular:  Vessel Right Left  Radial Palpable Palpable                          PT Palpable Palpable  DP Palpable Palpable   Gastrointestinal: soft, non-tender/non-distended. No guarding/reflex.  Musculoskeletal: M/S 5/5 throughout.  No deformity or atrophy. No edema. Neurologic: Sensation grossly intact in extremities.  Symmetrical.  Speech is fluent.  Psychiatric: Judgment intact, Mood & affect appropriate for pt's clinical situation. Dermatologic: No rashes or ulcers noted.  No cellulitis or open wounds.       Labs Recent Results (from the past 2160 hour(s))   I-STAT creatinine     Status: Abnormal   Collection Time: 05/28/20  2:00 PM  Result Value Ref Range   Creatinine, Ser 1.10 (H) 0.44 - 1.00 mg/dL    Radiology CT ANGIO HEAD W OR WO CONTRAST  Result Date: 05/28/2020 CLINICAL DATA:  Carotid artery stenosis. EXAM: CT ANGIOGRAPHY HEAD AND NECK TECHNIQUE: Multidetector CT imaging of the head and neck was performed using the standard protocol during bolus administration of intravenous contrast. Multiplanar CT image reconstructions and MIPs were obtained to evaluate the vascular anatomy. Carotid stenosis measurements (when applicable) are obtained utilizing NASCET criteria, using the distal internal carotid diameter as the denominator. CONTRAST:  65mL OMNIPAQUE IOHEXOL 350 MG/ML SOLN COMPARISON:  MRI of the brain July 01, 2018. FINDINGS: CT HEAD FINDINGS Brain: No evidence of acute infarction, hemorrhage, hydrocephalus, extra-axial collection or mass lesion/mass effect. Patchy hypodensity of the periventricular white matter, nonspecific, most likely related to chronic microvascular ischemic changes. Vascular: No hyperdense vessel. Skull: Normal. Negative for fracture or focal lesion. Sinuses: Left mastoid effusion.  Paranasal sinuses are clear. Orbits: No acute finding. Review of the MIP images confirms the above findings CTA NECK FINDINGS Aortic arch: Standard branching. Imaged portion shows no evidence of aneurysm or dissection. Heavily calcified plaques are seen in the aortic arch and at the origin the major neck arteries, without significant stenosis. Right carotid system: Atherosclerotic changes are seen along the right common carotid artery, carotid bifurcation and cervical right ICA with 75-80% stenosis at the bulb. Left carotid system: Atherosclerotic changes are seen along the left common carotid artery carotid bifurcation and cervical left ICA with approximately 50-55% stenosis at the bulb. Vertebral arteries: Heavily calcified plaque at the origin of  the right vertebral artery with mild stenosis. Remainder of the cervical and intracranial right vertebral artery is maintained. Heavily calcified plaque are seen in the left subclavian artery with severe stenosis proximal to the origin of the left vertebral artery. Mild tortuosity of the left V1 segment with mild stenosis. Remainder of the left vertebral artery is maintained. Skeleton: Degenerative changes of the cervical spine, more pronounced at the facet joints on the left from C3 through C6. Other neck: Negative. Upper chest: Irregular opacities in the bilateral lung apices are favored to represent fibrosis. Review of the MIP images confirms the above findings CTA HEAD FINDINGS Anterior circulation: Calcified plaques in the bilateral  carotid siphons without hemodynamically significant stenosis. Posterior circulation: No significant stenosis, proximal occlusion, aneurysm, or vascular malformation. Venous sinuses: Poorly opacified. Anatomic variants: None. Review of the MIP images confirms the above findings IMPRESSION: 1. Atherosclerotic changes at the bilateral carotid bifurcations with 75-80% stenosis on the right and 50-55% stenosis on the left with. 2. Severe stenosis of the left subclavian artery proximal to the origin of the left vertebral artery. 3. Heavily calcified plaque at the origin of the right vertebral artery with mild stenosis. 4. No acute intracranial findings. Aortic Atherosclerosis (ICD10-I70.0). Electronically Signed   By: Pedro Earls M.D.   On: 05/28/2020 15:51   CT ANGIO NECK W OR WO CONTRAST  Result Date: 05/28/2020 CLINICAL DATA:  Carotid artery stenosis. EXAM: CT ANGIOGRAPHY HEAD AND NECK TECHNIQUE: Multidetector CT imaging of the head and neck was performed using the standard protocol during bolus administration of intravenous contrast. Multiplanar CT image reconstructions and MIPs were obtained to evaluate the vascular anatomy. Carotid stenosis measurements (when  applicable) are obtained utilizing NASCET criteria, using the distal internal carotid diameter as the denominator. CONTRAST:  40mL OMNIPAQUE IOHEXOL 350 MG/ML SOLN COMPARISON:  MRI of the brain July 01, 2018. FINDINGS: CT HEAD FINDINGS Brain: No evidence of acute infarction, hemorrhage, hydrocephalus, extra-axial collection or mass lesion/mass effect. Patchy hypodensity of the periventricular white matter, nonspecific, most likely related to chronic microvascular ischemic changes. Vascular: No hyperdense vessel. Skull: Normal. Negative for fracture or focal lesion. Sinuses: Left mastoid effusion.  Paranasal sinuses are clear. Orbits: No acute finding. Review of the MIP images confirms the above findings CTA NECK FINDINGS Aortic arch: Standard branching. Imaged portion shows no evidence of aneurysm or dissection. Heavily calcified plaques are seen in the aortic arch and at the origin the major neck arteries, without significant stenosis. Right carotid system: Atherosclerotic changes are seen along the right common carotid artery, carotid bifurcation and cervical right ICA with 75-80% stenosis at the bulb. Left carotid system: Atherosclerotic changes are seen along the left common carotid artery carotid bifurcation and cervical left ICA with approximately 50-55% stenosis at the bulb. Vertebral arteries: Heavily calcified plaque at the origin of the right vertebral artery with mild stenosis. Remainder of the cervical and intracranial right vertebral artery is maintained. Heavily calcified plaque are seen in the left subclavian artery with severe stenosis proximal to the origin of the left vertebral artery. Mild tortuosity of the left V1 segment with mild stenosis. Remainder of the left vertebral artery is maintained. Skeleton: Degenerative changes of the cervical spine, more pronounced at the facet joints on the left from C3 through C6. Other neck: Negative. Upper chest: Irregular opacities in the bilateral lung  apices are favored to represent fibrosis. Review of the MIP images confirms the above findings CTA HEAD FINDINGS Anterior circulation: Calcified plaques in the bilateral carotid siphons without hemodynamically significant stenosis. Posterior circulation: No significant stenosis, proximal occlusion, aneurysm, or vascular malformation. Venous sinuses: Poorly opacified. Anatomic variants: None. Review of the MIP images confirms the above findings IMPRESSION: 1. Atherosclerotic changes at the bilateral carotid bifurcations with 75-80% stenosis on the right and 50-55% stenosis on the left with. 2. Severe stenosis of the left subclavian artery proximal to the origin of the left vertebral artery. 3. Heavily calcified plaque at the origin of the right vertebral artery with mild stenosis. 4. No acute intracranial findings. Aortic Atherosclerosis (ICD10-I70.0). Electronically Signed   By: Pedro Earls M.D.   On: 05/28/2020 15:51   VAS  [place] ABI WITH/WO TBI  Result Date: 06/15/2020 LOWER EXTREMITY DOPPLER STUDY Indications: Peripheral artery disease.  Vascular Interventions: 01/11/2018 PTA of the bilateral CIA with stents. Stent                         distal left CIA and proximal EIA. Performing Technologist: Concha Norway RVT  Examination Guidelines: A complete evaluation includes at minimum, Doppler waveform signals and systolic blood pressure reading at the level of bilateral brachial, anterior tibial, and posterior tibial arteries, when vessel segments are accessible. Bilateral testing is considered an integral part of a complete examination. Photoelectric Plethysmograph (PPG) waveforms and toe systolic pressure readings are included as required and additional duplex testing as needed. Limited examinations for reoccurring indications may be performed as noted.  ABI Findings: +---------+------------------+-----+---------+--------+ Right    Rt Pressure (mmHg)IndexWaveform Comment   +---------+------------------+-----+---------+--------+ Brachial 153                                      +---------+------------------+-----+---------+--------+ ATA      164               1.07 triphasic         +---------+------------------+-----+---------+--------+ PTA      161               1.05 triphasic         +---------+------------------+-----+---------+--------+ Great Toe121               0.79 Normal            +---------+------------------+-----+---------+--------+ +---------+------------------+-----+---------+-------+ Left     Lt Pressure (mmHg)IndexWaveform Comment +---------+------------------+-----+---------+-------+ ATA      159               1.04 triphasic        +---------+------------------+-----+---------+-------+ PTA      161               1.05 triphasic        +---------+------------------+-----+---------+-------+ Great Toe125               0.82 Normal           +---------+------------------+-----+---------+-------+ +-------+-----------+-----------+------------+------------+ ABI/TBIToday's ABIToday's TBIPrevious ABIPrevious TBI +-------+-----------+-----------+------------+------------+ Right  1.07       .79        1.07        .56          +-------+-----------+-----------+------------+------------+ Left   1.05       .82        1.11        .77          +-------+-----------+-----------+------------+------------+ Right TBIs appear increased compared to prior study on 03/2020.  Summary: Right: Resting right ankle-brachial index is within normal range. No evidence of significant right lower extremity arterial disease. The right toe-brachial index is normal. Left: Resting left ankle-brachial index is within normal range. No evidence of significant left lower extremity arterial disease. The left toe-brachial index is normal.  *See table(s) above for measurements and observations.  Electronically signed by Leotis Pain MD on 06/15/2020 at  11:58:35 AM.    Final     Assessment/Plan Diabetes ([place]) blood glucose control important in reducing the progression of atherosclerotic disease. Also, involved in wound healing. On appropriate medications.  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 No treatment currently required although this did look  fairly severe on her recent CT angiogram.  Hyperlipidemia lipid control important in reducing the progression of atherosclerotic disease. 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  Being checked annually.  Carotid stenosis She has undergone a CT angiogram which I have independently reviewed.  The official report is of a 75 to 80% right carotid artery stenosis but it appears worse than that to me.  The left carotid stenosis is estimated in the 55% range which seems reasonable.  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 We had a long discussion today with her daughter on the phone as well.  She has anatomy that would be acceptable for carotid endarterectomy or carotid stent placement.  We have discussed the differences between the 2 procedures.  We have discussed the risks and benefits of the 2 procedures.  She wants to go home and discuss this with her daughter before making a decision but is leaning towards carotid artery stenting.  Either way I think is reasonable.  She will contact our office with a decision.    Leotis Pain, MD  06/19/2020 2:54 PM    This note was created with Dragon medical transcription system.  Any errors from dictation are purely unintentional

## 2020-06-19 NOTE — Assessment & Plan Note (Signed)
She has undergone a CT angiogram which I have independently reviewed.  The official report is of a 75 to 80% right carotid artery stenosis but it appears worse than that to me.  The left carotid stenosis is estimated in the 55% range which seems reasonable.  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 We had a long discussion today with her daughter on the phone as well.  She has anatomy that would be acceptable for carotid endarterectomy or carotid stent placement.  We have discussed the differences between the 2 procedures.  We have discussed the risks and benefits of the 2 procedures.  She wants to go home and discuss this with her daughter before making a decision but is leaning towards carotid artery stenting.  Either way I think is reasonable.  She will contact our office with a decision.

## 2020-06-19 NOTE — Patient Instructions (Signed)
Carotid Artery Disease  Carotid artery disease, also called carotid artery stenosis, is the narrowing or blockage of one or both carotid arteries. The carotid arteries are the two main blood vessels on either side of the neck. They supply blood to the brain, other parts of the head, and the neck. Carotid artery disease increases your risk for a stroke or a transient ischemic attack (TIA). A TIA is a "mini-stroke" that causes stroke-like symptoms that then go away quickly. What are the causes? This condition is mainly caused by a narrowing and hardening of the carotid arteries (atherosclerosis). The carotid arteries can become narrow or clogged with a buildup of fat, cholesterol, calcium, and other substances (plaque). What increases the risk? The following factors may make you more likely to develop this condition:  Having certain medical conditions, such as: ? High cholesterol. ? High blood pressure (hypertension). ? Diabetes. ? Obesity.  Smoking.  A family history of cardiovascular disease.  Inactivity or lack of regular exercise.  Being female. Men have an increased risk of developing atherosclerosis earlier in life than women.  Old age. What are the signs or symptoms? This condition may not have any signs or symptoms until a stroke or TIA occurs. In some cases, your health care provider may be able to hear a whooshing sound (bruit). This can indicate a change in blood flow caused by plaque buildup. An eye exam can also help identify signs of the condition. How is this diagnosed? This condition may be diagnosed with a physical exam, your medical history, and your family's medical history. You may also have tests that look at the blood flow in your carotid arteries, such as:  Carotid artery ultrasound, which uses sound waves to create pictures to show if the arteries are narrow or blocked.  Tests that use a dye injected into a vein to highlight your arteries on images, such  as: ? Carotid or cerebral angiography, which uses X-rays. ? Computerized tomographic angiography (CTA), which uses CT scans. ? Magnetic resonance angiography (MRA), which uses MRI. How is this treated? This condition may be treated with a combination of treatments. Treatment options include:  Lifestyle changes, such as: ? Quitting smoking. ? Exercising regularly or as told by your health care provider. ? Eating a heart-healthy diet. ? Managing stress. ? Maintaining a healthy weight.  Medicines to control blood pressure, cholesterol, and blood clotting.  Surgery. You may have: ? A carotid endarterectomy. This is a surgery to remove the blockages in the carotid arteries. ? A carotid angioplasty with stenting. This is a procedure in which a small mesh tube (stent) is used to widen the blocked carotid arteries. Follow these instructions at home: Eating and drinking Follow instructions about your diet from your health care provider. It is important to:  Eat a healthy diet that is low in saturated fats and includes plenty of fresh fruits, vegetables, and lean meats.  Avoid foods that are high in fat and salt (sodium).  Avoid foods that are fried, overly processed, or have poor nutritional value.   Lifestyle  Maintain a healthy weight.  Do exercises as told by your health care provider to stay physically active. It is recommended that each week you get at least 150 minutes of moderate-intensity exercise or 75 minutes of exercise that takes a lot of effort.  Do not use any products that contain nicotine or tobacco, such as cigarettes, e-cigarettes, and chewing tobacco. If you need help quitting, ask your health care provider.    Do not drink alcohol if: ? Your health care provider tells you not to drink. ? You are pregnant, may be pregnant, or are planning to become pregnant.  If you drink alcohol: ? Limit how much you use to:  0-1 drink a day for women.  0-2 drinks a day for  men. ? Be aware of how much alcohol is in your drink. In the [place] one drink equals one 12 oz bottle of beer (355 mL), one 5 oz glass of wine (148 mL), or one 1 oz glass of hard liquor (44 mL).  Do not use drugs.  Manage your stress. Ask your health care provider for stress management tips.   General instructions  Take over-the-counter and prescription medicines only as told by your health care provider.  Keep all follow-up visits as told by your health care provider. This is important. Where to find more information  American Heart Association: www.heart.org Get help right away if:  You have any symptoms of a stroke. "BE FAST" is an easy way to remember the main warning signs of a stroke: ? B - Balance. Signs are dizziness, sudden trouble walking, or loss of balance. ? E - Eyes. Signs are trouble seeing or a sudden change in vision. ? F - Face. Signs are sudden weakness or numbness of the face, or the face or eyelid drooping on one side. ? A - Arms. Signs are weakness or numbness in an arm. This happens suddenly and usually on one side of the body. ? S - Speech. Signs are sudden trouble speaking, slurred speech, or trouble understanding what people say. ? T - Time. Time to call emergency services. Write down what time symptoms started.  You have other signs of a stroke, such as: ? A sudden, severe headache with no known cause. ? Nausea or vomiting. ? Seizure. These symptoms may represent a serious problem that is an emergency. Do not wait to see if the symptoms will go away. Get medical help right away. Call your local emergency services (911 in the [place] Do not drive yourself to the hospital. Summary  Carotid artery disease, also called carotid artery stenosis, is the narrowing or blockage of one or both carotid arteries.  Carotid artery disease increases your risk for a stroke or a transient ischemic attack (TIA).  This condition can be treated with lifestyle changes,  medicines, surgery, or a combination of these treatments.  Get help right away if you have any symptoms of stroke. The acronym BEFAST is an easy way to remember the main warning signs of stroke. This information is not intended to replace advice given to you by your health care provider. Make sure you discuss any questions you have with your health care provider. Document Revised: 10/18/2018 Document Reviewed: 10/18/2018 Elsevier Patient Education  2021 Elsevier Inc.  

## 2020-06-19 NOTE — Assessment & Plan Note (Signed)
Perfusion recently checked and was normal.  Being checked annually.

## 2020-06-26 SURGERY — Surgical Case
Anesthesia: *Unknown

## 2020-07-10 ENCOUNTER — Other Ambulatory Visit: Payer: Self-pay

## 2020-07-10 ENCOUNTER — Other Ambulatory Visit
Admission: RE | Admit: 2020-07-10 | Discharge: 2020-07-10 | Disposition: A | Discharge status: Home / Self Care | Payer: Medicare Other | Source: Ambulatory Visit | Attending: Vascular Surgery | Admitting: Vascular Surgery

## 2020-07-10 ENCOUNTER — Other Ambulatory Visit (INDEPENDENT_AMBULATORY_CARE_PROVIDER_SITE_OTHER): Payer: Self-pay | Admitting: Nurse Practitioner

## 2020-07-10 DIAGNOSIS — Z20822 Contact with and (suspected) exposure to covid-19: Secondary | ICD-10-CM | POA: Insufficient documentation

## 2020-07-10 DIAGNOSIS — Z01812 Encounter for preprocedural laboratory examination: Secondary | ICD-10-CM | POA: Insufficient documentation

## 2020-07-10 LAB — SARS CORONAVIRUS 2 (TAT 6-24 HRS): SARS Coronavirus 2: NEGATIVE

## 2020-07-10 NOTE — Patient Instructions (Signed)
Your procedure is scheduled on: Thursday 07/12/20.  Report to THE FIRST FLOOR REGISTRATION DESK IN THE MEDICAL MALL ON THE MORNING OF SURGERY FIRST, THEN YOU WILL CHECK IN AT THE SURGERY INFORMATION DESK LOCATED OUTSIDE THE SAME DAY SURGERY DEPARTMENT LOCATED ON 2ND FLOOR MEDICAL MALL ENTRANCE.  To find out your arrival time please call [phone] between 1PM - 3PM on Wednesday 07/11/20.   Remember: Instructions that are not followed completely may result in serious medical risk, up to and including death, or upon the discretion of your surgeon and anesthesiologist your surgery may need to be rescheduled.     __X__ 1. Do not eat food after midnight the night before your procedure.                 No gum chewing or hard candies. You may drink clear liquids up to 2 hours                 before you are scheduled to arrive for your surgery- DO NOT drink clear                 liquids within 2 hours of the start of your surgery.                 Clear Liquids include:  water, apple juice without pulp, clear carbohydrate                 drink such as Clearfast or Gatorade, Black Coffee or Tea (Do not add                 milk or creamer to coffee or tea).   __X__2.  On the morning of surgery brush your teeth with toothpaste and water, you may rinse your mouth with mouthwash if you wish.  Do not swallow any toothpaste or mouthwash.    __X__ 3.  No Alcohol for 24 hours before or after surgery.  __X__ 4.  Do Not Smoke or use e-cigarettes For 24 Hours Prior to Your Surgery.                 Do not use any chewable tobacco products for at least 6 hours prior to                 surgery.  __X__5.  Notify your doctor if there is any change in your medical condition      (cold, fever, infections).      Do NOT wear jewelry, make-up, hairpins, clips or nail polish. Do NOT wear lotions, powders, or perfumes.  Do NOT shave 48 hours prior to surgery. Men may shave face and neck. Do NOT bring valuables to  the hospital.     [facility] is not responsible for any belongings or valuables.   Contacts, dentures/partials or body piercings may not be worn into surgery. Bring a case for your contacts, glasses or hearing aids, a denture cup will be supplied. Leave your suitcase in the car. After surgery it may be brought to your room.   For patients admitted to the hospital, discharge time is determined by your treatment team.    Patients discharged the day of surgery will not be allowed to drive home.     __X__ Take these medicines the morning of surgery with A SIP OF WATER:     1. buPROPion (WELLBUTRIN SR)  2. Fluticasone-Salmeterol (ADVAIR)  3. fluticasone (FLONASE)  4. levothyroxine (SYNTHROID)  5. loratadine (CLARITIN)  6. omeprazole (  PRILOSEC)  7. rosuvastatin (CRESTOR)   8. venlafaxine (EFFEXOR)    __X__ Use CHG Soap as directed.  __X__ Use inhalers on the morning of surgery and bring them to the hospital.  __X__ Stop Metformin 2 days prior to your surgery.   __X__ Stop Blood Thinners: Eliquis 2 days prior to your surgery. Your last dose was on Tuesday 07/10/20.  __X__ Stop Anti-inflammatories 7 days before surgery such as Advil, Ibuprofen, Motrin, BC or Goodies Powder, Naprosyn, Naproxen, Aleve, Aspirin, Meloxicam. May take Tylenol if needed for pain or discomfort.   __X__Do not start taking any new herbal supplements or vitamins prior to your procedure.  __X__ Stop taking omega-3 acid ethyl esters (LOVAZA) until after your procedure.    Wear comfortable clothing (specific to your surgery type) to the hospital.  Plan for stool softeners for home use; pain medications have a tendency to cause constipation. You can also help prevent constipation by eating foods high in fiber such as fruits and vegetables and drinking plenty of fluids as your diet allows.  After surgery, you can prevent lung complications by doing breathing exercises.Take deep breaths and cough every 1-2  hours. Your doctor may order a device called an Incentive Spirometer to help you take deep breaths.  Please call the [place] Department at [phone] if you have any questions about these instructions.

## 2020-07-11 ENCOUNTER — Emergency Department
Admission: EM | Admit: 2020-07-11 | Discharge: 2020-07-11 | Disposition: A | Discharge status: Home / Self Care | Payer: Medicare Other | Attending: Emergency Medicine | Admitting: Emergency Medicine

## 2020-07-11 ENCOUNTER — Encounter: Payer: Self-pay | Admitting: Vascular Surgery

## 2020-07-11 ENCOUNTER — Encounter: Payer: Self-pay | Admitting: Urgent Care

## 2020-07-11 ENCOUNTER — Telehealth (INDEPENDENT_AMBULATORY_CARE_PROVIDER_SITE_OTHER): Payer: Self-pay

## 2020-07-11 ENCOUNTER — Emergency Department: Payer: Medicare Other

## 2020-07-11 ENCOUNTER — Encounter
Admission: RE | Admit: 2020-07-11 | Discharge: 2020-07-11 | Disposition: A | Discharge status: Home / Self Care | Payer: Medicare Other | Source: Ambulatory Visit | Attending: Vascular Surgery | Admitting: Vascular Surgery

## 2020-07-11 DIAGNOSIS — I5022 Chronic systolic (congestive) heart failure: Secondary | ICD-10-CM | POA: Insufficient documentation

## 2020-07-11 DIAGNOSIS — Z79899 Other long term (current) drug therapy: Secondary | ICD-10-CM | POA: Diagnosis not present

## 2020-07-11 DIAGNOSIS — N183 Chronic kidney disease, stage 3 unspecified: Secondary | ICD-10-CM | POA: Diagnosis not present

## 2020-07-11 DIAGNOSIS — E1122 Type 2 diabetes mellitus with diabetic chronic kidney disease: Secondary | ICD-10-CM | POA: Insufficient documentation

## 2020-07-11 DIAGNOSIS — I130 Hypertensive heart and chronic kidney disease with heart failure and stage 1 through stage 4 chronic kidney disease, or unspecified chronic kidney disease: Secondary | ICD-10-CM | POA: Diagnosis not present

## 2020-07-11 DIAGNOSIS — Z95810 Presence of automatic (implantable) cardiac defibrillator: Secondary | ICD-10-CM | POA: Diagnosis not present

## 2020-07-11 DIAGNOSIS — E039 Hypothyroidism, unspecified: Secondary | ICD-10-CM | POA: Insufficient documentation

## 2020-07-11 DIAGNOSIS — I2510 Atherosclerotic heart disease of native coronary artery without angina pectoris: Secondary | ICD-10-CM | POA: Diagnosis not present

## 2020-07-11 DIAGNOSIS — Z7984 Long term (current) use of oral hypoglycemic drugs: Secondary | ICD-10-CM | POA: Insufficient documentation

## 2020-07-11 DIAGNOSIS — Z01818 Encounter for other preprocedural examination: Secondary | ICD-10-CM | POA: Insufficient documentation

## 2020-07-11 DIAGNOSIS — S82032A Displaced transverse fracture of left patella, initial encounter for closed fracture: Secondary | ICD-10-CM | POA: Insufficient documentation

## 2020-07-11 DIAGNOSIS — Z8541 Personal history of malignant neoplasm of cervix uteri: Secondary | ICD-10-CM | POA: Insufficient documentation

## 2020-07-11 DIAGNOSIS — F17210 Nicotine dependence, cigarettes, uncomplicated: Secondary | ICD-10-CM | POA: Insufficient documentation

## 2020-07-11 DIAGNOSIS — J449 Chronic obstructive pulmonary disease, unspecified: Secondary | ICD-10-CM | POA: Diagnosis not present

## 2020-07-11 DIAGNOSIS — W231XXA Caught, crushed, jammed, or pinched between stationary objects, initial encounter: Secondary | ICD-10-CM | POA: Insufficient documentation

## 2020-07-11 DIAGNOSIS — J45909 Unspecified asthma, uncomplicated: Secondary | ICD-10-CM | POA: Diagnosis not present

## 2020-07-11 DIAGNOSIS — Z7952 Long term (current) use of systemic steroids: Secondary | ICD-10-CM | POA: Insufficient documentation

## 2020-07-11 DIAGNOSIS — S80912A Unspecified superficial injury of left knee, initial encounter: Secondary | ICD-10-CM | POA: Diagnosis present

## 2020-07-11 LAB — BASIC METABOLIC PANEL
Anion gap: 9 (ref 5–15)
BUN: 15 mg/dL (ref 8–23)
CO2: 26 mmol/L (ref 22–32)
Calcium: 9.9 mg/dL (ref 8.9–10.3)
Chloride: 104 mmol/L (ref 98–111)
Creatinine, Ser: 1.07 mg/dL — ABNORMAL HIGH (ref 0.44–1.00)
GFR, Estimated: 53 mL/min — ABNORMAL LOW (ref >60)
Glucose, Bld: 243 mg/dL — ABNORMAL HIGH (ref 70–99)
Potassium: 4.1 mmol/L (ref 3.5–5.1)
Sodium: 139 mmol/L (ref 135–145)

## 2020-07-11 LAB — PROTIME-INR
INR: 1.2 (ref 0.8–1.2)
Prothrombin Time: 14.4 seconds (ref 11.4–15.2)

## 2020-07-11 LAB — CBC WITH DIFFERENTIAL/PLATELET
Abs Immature Granulocytes: 0.02 10*3/uL (ref 0.00–0.07)
Basophils Absolute: 0 10*3/uL (ref 0.0–0.1)
Basophils Relative: 0 %
Eosinophils Absolute: 0.3 10*3/uL (ref 0.0–0.5)
Eosinophils Relative: 4 %
HCT: 44.7 % (ref 36.0–46.0)
Hemoglobin: 14.5 g/dL (ref 12.0–15.0)
Immature Granulocytes: 0 %
Lymphocytes Relative: 24 %
Lymphs Abs: 1.8 10*3/uL (ref 0.7–4.0)
MCH: 31.4 pg (ref 26.0–34.0)
MCHC: 32.4 g/dL (ref 30.0–36.0)
MCV: 96.8 fL (ref 80.0–100.0)
Monocytes Absolute: 0.3 10*3/uL (ref 0.1–1.0)
Monocytes Relative: 4 %
Neutro Abs: 5.2 10*3/uL (ref 1.7–7.7)
Neutrophils Relative %: 68 %
Platelets: 184 10*3/uL (ref 150–400)
RBC: 4.62 MIL/uL (ref 3.87–5.11)
RDW: 13.3 % (ref 11.5–15.5)
WBC: 7.7 10*3/uL (ref 4.0–10.5)
nRBC: 0 % (ref 0.0–0.2)

## 2020-07-11 LAB — APTT: aPTT: 30 seconds (ref 24–36)

## 2020-07-11 LAB — SURGICAL PCR SCREEN
MRSA, PCR: NEGATIVE
Staphylococcus aureus: POSITIVE — AB

## 2020-07-11 LAB — TYPE AND SCREEN
ABO/RH(D): O NEG
Antibody Screen: NEGATIVE

## 2020-07-11 MED ORDER — OXYCODONE-ACETAMINOPHEN 5-325 MG PO TABS: 1.0000 | ORAL_TABLET | ORAL | 0 refills | Status: DC | PRN

## 2020-07-11 MED ORDER — MORPHINE SULFATE (PF) 4 MG/ML IV SOLN
INTRAVENOUS | Status: AC
  Administered 2020-07-11: 4 mg via INTRAVENOUS
  Filled 2020-07-11: qty 1

## 2020-07-11 MED ORDER — MORPHINE SULFATE (PF) 4 MG/ML IV SOLN: 4.0000 mg | Freq: Once | INTRAVENOUS | Status: AC

## 2020-07-11 MED ORDER — OXYCODONE-ACETAMINOPHEN 5-325 MG PO TABS
1.0000 | ORAL_TABLET | Freq: Once | ORAL | Status: AC
Start: 2020-07-11 — End: 2020-07-11
  Administered 2020-07-11: 1 via ORAL
  Filled 2020-07-11: qty 1

## 2020-07-11 NOTE — ED Triage Notes (Signed)
[facility] called to pt's home d/t pt falling up stairs. Visible deformity to left patella. MD at bedside. Pt denies LOC or hitting head. Pt is scheduled tomorrow for a carotid procedure tomorrow, here.

## 2020-07-11 NOTE — Telephone Encounter (Signed)
I attempted to contact the patient to let her know that her right CEA has been canceled for now due to her needing to be cardiac cleared. A message was left for a return call. I spoke with the patient's daughter and explained that we are waiting for a cardiac clearance for [facility] clinic and once we have that we will reschedule her surgery with Dr. Lucky Cowboy. Patient's daughter understood.

## 2020-07-11 NOTE — ED Provider Notes (Signed)
[facility] Emergency Department Provider Note   ____________________________________________   Event Date/Time   First MD Initiated Contact with Patient 07/11/20 1654     (approximate)  I have reviewed the triage vital signs and the nursing notes.   HISTORY  Chief Complaint Knee Injury    HPI Christine Obrien is a 80 y.o. female with past medical history of hypertension, hyperlipidemia, diabetes, CAD status post CABG, CKD, asthma, and carotid stenosis who presents to the ED complaining of knee pain.  Patient reports that she went to step up onto her porch just prior to arrival when her shoe got caught and she fell directly onto her left knee.  She denies hitting her head or losing consciousness.  She complains of severe pain at her left knee and has been unable to walk since the fall.  She denies pain to her right lower extremity or either upper extremities.  She currently takes Eliquis.  Obvious deformity was noted by EMS and patient treated with 70 mcg of fentanyl.        Past Medical History:  Diagnosis Date  . Anxiety   . Asthma   . Atherosclerotic peripheral vascular disease with intermittent claudication ([place])   . Cancer (HCC)    cervical  . Carotid artery occlusion   . CHF (congestive heart failure), NYHA class III ([place])   . CKD (chronic kidney disease), stage III ([place])   . COPD (chronic obstructive pulmonary disease) ([place])   . Coronary artery disease   . Current use of long term anticoagulation   . Depression   . Diabetes mellitus without complication ([place])   . GERD (gastroesophageal reflux disease)   . Gout   . HLD (hyperlipidemia)   . Hx of CABG   . Hypertension   . Hypothyroidism   . Kidney stones   . LBBB (left bundle branch block)   . Myocardial infarction ([place])   . Subclavian steal syndrome     Patient Active Problem List   Diagnosis Date Noted  . ICD (implantable cardioverter-defibrillator) in place 11/02/2019  . Chronic  systolic CHF (congestive heart failure), NYHA class 3 ([place]) 02/14/2019  . LBBB (left bundle branch block) 12/02/2018  . 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 Subclavian steal syndrome 01/12/2018  . Atherosclerotic peripheral vascular disease with intermittent claudication ([place]) 12/30/2017  . Pain in both upper extremities 12/30/2017  . Hyperlipidemia 12/30/2017  . Coronary artery disease involving native heart 12/30/2017  . Postoperative atrial fibrillation ([place]) 07/09/2017  . S/P CABG x 2 07/09/2017  . Stenosis of left subclavian artery ([place]) 07/07/2017  . History of non-ST elevation myocardial infarction (NSTEMI) 07/06/2017  . Presence of stent in coronary artery 07/06/2017  . Chest pain 07/03/2017  . NSTEMI (non-ST elevated myocardial infarction) ([place]) 07/03/2017  . Impingement syndrome of right shoulder region 03/06/2017  . Diabetes ([place]) 03/25/2016  . Carotid stenosis 03/25/2016  . Anxiety, generalized 08/24/2015  . Gastroesophageal reflux disease without esophagitis 07/13/2015  . DDD (degenerative disc disease), cervical 04/06/2015  . Cervical radiculitis 04/06/2015  . Type 2 diabetes mellitus without complications ([place]) 16/96/7893  . Carotid bruit 09/15/2011  . Symptoms involving cardiovascular system 09/15/2011  . Essential hypertension 09/11/2011  . Dysthymic disorder 09/11/2011  . Hypothyroidism 09/11/2011  . Major depressive disorder, recurrent episode, moderate ([place]) 09/11/2011  . Dyslipidemia 03/18/2011    Past Surgical History:  Procedure Laterality Date  . ABDOMINAL HYSTERECTOMY     partial  .  CHOLECYSTECTOMY    . COLONOSCOPY WITH PROPOFOL N/A 01/11/2020   Procedure: COLONOSCOPY WITH PROPOFOL;  Surgeon: Toledo, Benay Pike, MD;  Location: [facility] ENDOSCOPY;  Service: Gastroenterology;  Laterality: N/A;  . CORONARY ANGIOPLASTY    . CORONARY ARTERY BYPASS GRAFT     double  . EYE SURGERY Bilateral 08/2009   cataracts  . HIATAL HERNIA REPAIR   1980s  . LEFT HEART CATH AND CORONARY ANGIOGRAPHY N/A 07/03/2017   Procedure: LEFT HEART CATH AND CORONARY ANGIOGRAPHY;  Surgeon: Corey Skains, MD;  Location: [facility];  Service: Cardiovascular;  Laterality: N/A;  . LOWER EXTREMITY ANGIOGRAPHY Left 01/11/2018   Procedure: LOWER EXTREMITY ANGIOGRAPHY;  Surgeon: Algernon Huxley, MD;  Location: [facility];  Service: Cardiovascular;  Laterality: Left;  . THYROID SURGERY     pt does not remember if removed or partial removal    Prior to Admission medications   Medication Sig Start Date End Date Taking? Authorizing Provider  Acetaminophen (TYLENOL PO) Take 500 mg by mouth.    Yes [provider]  apixaban (ELIQUIS) 5 MG TABS tablet Take 5 mg by mouth daily. 03/05/20  Yes [provider]  empagliflozin (JARDIANCE) 10 MG TABS tablet Take 10 mg by mouth daily.   Yes [provider]  ezetimibe (ZETIA) 10 MG tablet Take by mouth. 08/20/17 07/11/20 Yes [provider]  ferrous sulfate 325 (65 FE) MG tablet Take 325 mg by mouth.  03/09/16  Yes [provider]  fluticasone (FLONASE) 50 MCG/ACT nasal spray Place 2 sprays into the nose daily.   Yes [provider]  Fluticasone-Salmeterol (ADVAIR) 250-50 MCG/DOSE AEPB Inhale 1 puff into the lungs daily. 08/13/18  Yes [provider]  glipiZIDE (GLUCOTROL) 5 MG tablet Take by mouth daily before breakfast.   Yes [provider]  levothyroxine (SYNTHROID) 137 MCG tablet Take 137 mcg by mouth daily. 10/29/17  Yes [provider]  loratadine (CLARITIN) 10 MG tablet Take 10 mg by mouth daily. 08/04/18  Yes [provider]  metFORMIN (GLUCOPHAGE) 500 MG tablet Take 500 mg by mouth 2 (two) times daily with a meal.   Yes [provider]  metoprolol tartrate (LOPRESSOR) 25 MG tablet Take 1 tablet (25 mg total) by mouth 2 (two) times daily. 07/04/17  Yes Fritzi Mandes, MD  Multiple Vitamin (MULTIVITAMIN)  capsule Take 1 capsule by mouth daily.   Yes [provider]  omega-3 acid ethyl esters (LOVAZA) 1 g capsule Take 2 g by mouth 2 (two) times daily.   Yes [provider]  omeprazole (PRILOSEC) 20 MG capsule Take 20 mg by mouth 2 (two) times daily before a meal. 01/09/16  Yes [provider]  oxyCODONE-acetaminophen (PERCOCET) 5-325 MG tablet Take 1 tablet by mouth every 4 (four) hours as needed for severe pain. 07/11/20 07/11/21 Yes Blake Divine, MD  venlafaxine (EFFEXOR) 75 MG tablet Take 75 mg by mouth 2 (two) times daily with a meal.   Yes [provider]  doxepin (SINEQUAN) 10 MG capsule Take by mouth. Two tablets at night 02/01/18 04/17/20  [provider]  losartan (COZAAR) 25 MG tablet Take by mouth. 12/02/18 04/17/20  [provider]    Allergies Ace inhibitors, Atorvastatin, Statins, and Sulfa antibiotics  Family History  Problem Relation Age of Onset  . Hypertension Mother   . Hypertension Father   . Diabetes Sister   . Cancer Brother   . Diabetes Brother   . Breast cancer Neg  Hx     Social History Social History   Tobacco Use  . Smoking status: Current Every Day Smoker    Packs/day: 1.00    Types: Cigarettes  . Smokeless tobacco: Never Used  Vaping Use  . Vaping Use: Never used  Substance Use Topics  . Alcohol use: No  . Drug use: No    Review of Systems  Constitutional: No fever/chills Eyes: No visual changes. ENT: No sore throat. Cardiovascular: Denies chest pain. Respiratory: Denies shortness of breath. Gastrointestinal: No abdominal pain.  No nausea, no vomiting.  No diarrhea.  No constipation. Genitourinary: Negative for dysuria. Musculoskeletal: Negative for back pain.  Positive for left knee pain. Skin: Negative for rash. Neurological: Negative for headaches, focal weakness or numbness.  ____________________________________________   PHYSICAL EXAM:  VITAL SIGNS: ED Triage Vitals  Enc Vitals  Group     BP      Pulse      Resp      Temp      Temp src      SpO2      Weight      Height      Head Circumference      Peak Flow      Pain Score      Pain Loc      Pain Edu?      Excl. in [place]?     Constitutional: Alert and oriented. Eyes: Conjunctivae are normal. Head: Atraumatic. Nose: No congestion/rhinnorhea. Mouth/Throat: Mucous membranes are moist. Neck: Normal ROM Cardiovascular: Normal rate, regular rhythm. Grossly normal heart sounds.  2+ DP pulses bilaterally. Respiratory: Normal respiratory effort.  No retractions. Lungs CTAB. Gastrointestinal: Soft and nontender. No distention. Genitourinary: deferred Musculoskeletal: Obvious deformity noted to left knee with patella alta, knee held in flexed position.  Patient able to gently straighten knee. Neurologic:  Normal speech and language. No gross focal neurologic deficits are appreciated. Skin:  Skin is warm, dry and intact. No rash noted. Psychiatric: Mood and affect are normal. Speech and behavior are normal.  ____________________________________________   LABS (all labs ordered are listed, but only abnormal results are displayed)  Labs Reviewed - No data to display   PROCEDURES  Procedure(s) performed (including Critical Care):  Procedures   ____________________________________________   INITIAL IMPRESSION / ASSESSMENT AND PLAN / ED COURSE       80 year old female with past medical history of hypertension, hyperlipidemia, diabetes, CAD status post CABG, CKD, asthma, and carotid stenosis who presents to the ED complaining of left knee pain following a fall.  She has obvious deformity with apparent patella alta, suspect patellar tendon rupture and we will further assess with x-ray.  She is neurovascularly intact to her left lower extremity.  No evidence of trauma to her head, neck, trunk, or other extremity.  We will treat patient's pain with IV morphine.  X-rays of left knee reviewed by me and are  consistent with transverse patellar fracture with significant displacement.  Case discussed with Dr. Harlow Mares of orthopedics, who agrees with plan for knee immobilizer and outpatient follow-up for scheduling of surgery.  Patient's pain is improved following morphine and placement of knee immobilizer.  Her blood pressures have been borderline low here in the ED, but she is asymptomatic with this.  She does take medicine for her blood pressure and was counseled to check her pressure in the morning, hold antihypertensives if it remains low.  She was counseled to return to the ED for any new or worsening symptoms,  patient agrees with plan.      ____________________________________________   FINAL CLINICAL IMPRESSION(S) / ED DIAGNOSES  Final diagnoses:  Closed displaced transverse fracture of left patella, initial encounter     ED Discharge Orders         Ordered    oxyCODONE-acetaminophen (PERCOCET) 5-325 MG tablet  Every 4 hours PRN        07/11/20 2042           Note:  This document was prepared using Dragon voice recognition software and may include unintentional dictation errors.   Blake Divine, MD 07/11/20 2044

## 2020-07-12 ENCOUNTER — Encounter: Admission: RE | Payer: Self-pay | Source: Home / Self Care

## 2020-07-12 ENCOUNTER — Inpatient Hospital Stay: Admission: RE | Admit: 2020-07-12 | Payer: Medicare Other | Source: Home / Self Care | Admitting: Vascular Surgery

## 2020-07-12 HISTORY — DX: Long term (current) use of anticoagulants: Z79.01

## 2020-07-12 HISTORY — DX: Hyperlipidemia, unspecified: E78.5

## 2020-07-12 HISTORY — DX: Heart failure, unspecified: I50.9

## 2020-07-12 HISTORY — DX: Left bundle-branch block, unspecified: I44.7

## 2020-07-12 HISTORY — DX: Other transient cerebral ischemic attacks and related syndromes: G45.8

## 2020-07-12 HISTORY — DX: Presence of aortocoronary bypass graft: Z95.1

## 2020-07-12 HISTORY — DX: Atherosclerosis of native arteries of extremities with intermittent claudication, unspecified extremity: I70.219

## 2020-07-12 HISTORY — DX: Chronic kidney disease, stage 3 unspecified: N18.30

## 2020-07-12 SURGERY — ENDARTERECTOMY, CAROTID
Anesthesia: General | Laterality: Right

## 2020-07-18 ENCOUNTER — Emergency Department: Payer: Medicare Other

## 2020-07-18 ENCOUNTER — Other Ambulatory Visit: Payer: Self-pay

## 2020-07-18 ENCOUNTER — Emergency Department
Admission: EM | Admit: 2020-07-18 | Discharge: 2020-07-18 | Disposition: A | Discharge status: Home / Self Care | Payer: Medicare Other | Source: Home / Self Care | Attending: Emergency Medicine | Admitting: Emergency Medicine

## 2020-07-18 DIAGNOSIS — J45909 Unspecified asthma, uncomplicated: Secondary | ICD-10-CM | POA: Insufficient documentation

## 2020-07-18 DIAGNOSIS — Z95810 Presence of automatic (implantable) cardiac defibrillator: Secondary | ICD-10-CM | POA: Insufficient documentation

## 2020-07-18 DIAGNOSIS — Z7901 Long term (current) use of anticoagulants: Secondary | ICD-10-CM | POA: Insufficient documentation

## 2020-07-18 DIAGNOSIS — Z951 Presence of aortocoronary bypass graft: Secondary | ICD-10-CM | POA: Insufficient documentation

## 2020-07-18 DIAGNOSIS — I130 Hypertensive heart and chronic kidney disease with heart failure and stage 1 through stage 4 chronic kidney disease, or unspecified chronic kidney disease: Secondary | ICD-10-CM | POA: Insufficient documentation

## 2020-07-18 DIAGNOSIS — Z7984 Long term (current) use of oral hypoglycemic drugs: Secondary | ICD-10-CM | POA: Insufficient documentation

## 2020-07-18 DIAGNOSIS — I5022 Chronic systolic (congestive) heart failure: Secondary | ICD-10-CM | POA: Insufficient documentation

## 2020-07-18 DIAGNOSIS — N183 Chronic kidney disease, stage 3 unspecified: Secondary | ICD-10-CM | POA: Insufficient documentation

## 2020-07-18 DIAGNOSIS — Z79899 Other long term (current) drug therapy: Secondary | ICD-10-CM | POA: Insufficient documentation

## 2020-07-18 DIAGNOSIS — S0990XA Unspecified injury of head, initial encounter: Secondary | ICD-10-CM | POA: Insufficient documentation

## 2020-07-18 DIAGNOSIS — Y92002 Bathroom of unspecified non-institutional (private) residence single-family (private) house as the place of occurrence of the external cause: Secondary | ICD-10-CM | POA: Insufficient documentation

## 2020-07-18 DIAGNOSIS — E039 Hypothyroidism, unspecified: Secondary | ICD-10-CM | POA: Insufficient documentation

## 2020-07-18 DIAGNOSIS — S61412A Laceration without foreign body of left hand, initial encounter: Secondary | ICD-10-CM | POA: Insufficient documentation

## 2020-07-18 DIAGNOSIS — Z955 Presence of coronary angioplasty implant and graft: Secondary | ICD-10-CM | POA: Insufficient documentation

## 2020-07-18 DIAGNOSIS — I2510 Atherosclerotic heart disease of native coronary artery without angina pectoris: Secondary | ICD-10-CM | POA: Insufficient documentation

## 2020-07-18 DIAGNOSIS — Z23 Encounter for immunization: Secondary | ICD-10-CM | POA: Insufficient documentation

## 2020-07-18 DIAGNOSIS — R0602 Shortness of breath: Secondary | ICD-10-CM | POA: Diagnosis not present

## 2020-07-18 DIAGNOSIS — E1122 Type 2 diabetes mellitus with diabetic chronic kidney disease: Secondary | ICD-10-CM | POA: Insufficient documentation

## 2020-07-18 DIAGNOSIS — E785 Hyperlipidemia, unspecified: Secondary | ICD-10-CM | POA: Insufficient documentation

## 2020-07-18 DIAGNOSIS — Z7951 Long term (current) use of inhaled steroids: Secondary | ICD-10-CM | POA: Insufficient documentation

## 2020-07-18 DIAGNOSIS — W01190A Fall on same level from slipping, tripping and stumbling with subsequent striking against furniture, initial encounter: Secondary | ICD-10-CM | POA: Insufficient documentation

## 2020-07-18 DIAGNOSIS — E1169 Type 2 diabetes mellitus with other specified complication: Secondary | ICD-10-CM | POA: Insufficient documentation

## 2020-07-18 DIAGNOSIS — S61217A Laceration without foreign body of left little finger without damage to nail, initial encounter: Secondary | ICD-10-CM | POA: Insufficient documentation

## 2020-07-18 DIAGNOSIS — F17210 Nicotine dependence, cigarettes, uncomplicated: Secondary | ICD-10-CM | POA: Insufficient documentation

## 2020-07-18 DIAGNOSIS — M25562 Pain in left knee: Secondary | ICD-10-CM | POA: Insufficient documentation

## 2020-07-18 DIAGNOSIS — Z8541 Personal history of malignant neoplasm of cervix uteri: Secondary | ICD-10-CM | POA: Insufficient documentation

## 2020-07-18 DIAGNOSIS — W19XXXA Unspecified fall, initial encounter: Secondary | ICD-10-CM

## 2020-07-18 LAB — CBC WITH DIFFERENTIAL/PLATELET
Abs Immature Granulocytes: 0.02 10*3/uL (ref 0.00–0.07)
Basophils Absolute: 0 10*3/uL (ref 0.0–0.1)
Basophils Relative: 0 %
Eosinophils Absolute: 0.2 10*3/uL (ref 0.0–0.5)
Eosinophils Relative: 3 %
HCT: 37.3 % (ref 36.0–46.0)
Hemoglobin: 12.5 g/dL (ref 12.0–15.0)
Immature Granulocytes: 0 %
Lymphocytes Relative: 23 %
Lymphs Abs: 1.5 10*3/uL (ref 0.7–4.0)
MCH: 32 pg (ref 26.0–34.0)
MCHC: 33.5 g/dL (ref 30.0–36.0)
MCV: 95.4 fL (ref 80.0–100.0)
Monocytes Absolute: 0.4 10*3/uL (ref 0.1–1.0)
Monocytes Relative: 6 %
Neutro Abs: 4.4 10*3/uL (ref 1.7–7.7)
Neutrophils Relative %: 68 %
Platelets: 202 10*3/uL (ref 150–400)
RBC: 3.91 MIL/uL (ref 3.87–5.11)
RDW: 13.5 % (ref 11.5–15.5)
WBC: 6.5 10*3/uL (ref 4.0–10.5)
nRBC: 0 % (ref 0.0–0.2)

## 2020-07-18 LAB — BASIC METABOLIC PANEL
Anion gap: 9 (ref 5–15)
BUN: 18 mg/dL (ref 8–23)
CO2: 24 mmol/L (ref 22–32)
Calcium: 9.5 mg/dL (ref 8.9–10.3)
Chloride: 104 mmol/L (ref 98–111)
Creatinine, Ser: 0.97 mg/dL (ref 0.44–1.00)
GFR, Estimated: 60 mL/min — ABNORMAL LOW (ref >60)
Glucose, Bld: 131 mg/dL — ABNORMAL HIGH (ref 70–99)
Potassium: 3.6 mmol/L (ref 3.5–5.1)
Sodium: 137 mmol/L (ref 135–145)

## 2020-07-18 LAB — PROTIME-INR
INR: 1.1 (ref 0.8–1.2)
Prothrombin Time: 13.8 seconds (ref 11.4–15.2)

## 2020-07-18 MED ORDER — MORPHINE SULFATE (PF) 4 MG/ML IV SOLN
4.0000 mg | Freq: Once | INTRAVENOUS | Status: AC
  Administered 2020-07-18: 4 mg via INTRAVENOUS
  Filled 2020-07-18: qty 1

## 2020-07-18 MED ORDER — TETANUS-DIPHTH-ACELL PERTUSSIS 5-2.5-18.5 LF-MCG/0.5 IM SUSY
0.5000 mL | PREFILLED_SYRINGE | Freq: Once | INTRAMUSCULAR | Status: AC
  Administered 2020-07-18: 0.5 mL via INTRAMUSCULAR
  Filled 2020-07-18: qty 0.5

## 2020-07-18 MED ORDER — BACITRACIN ZINC 500 UNIT/GM EX OINT
TOPICAL_OINTMENT | CUTANEOUS | Status: AC
  Filled 2020-07-18: qty 0.9

## 2020-07-18 NOTE — ED Provider Notes (Signed)
[facility] Emergency Department Provider Note  ____________________________________________   None    (approximate)  I have reviewed the triage vital signs and the nursing notes.   HISTORY  Chief Complaint Fall   HPI Christine Obrien is a 80 y.o. female  with past medical history of hypertension, hyperlipidemia, diabetes, CAD status post CABG, CKD, asthma, and carotid stenosis, and recent ground-level fall complicated by a left patellar fracture scheduled to undergo surgery tomorrow who presents via EMS from home after she had a mechanical fall.  Patient states she got up from the bathroom and slipped trying to grip her walker striking her left hand against some furniture.  She does states she did hit her head but did not have any LOC and does not have any headache.  She states now his pain in her left hand and some pain from prior injury in her left knee but no other new pain.  She specifically denies any new pain in the bilateral ankles, hips, right knee, wrist, elbows, shoulders, chest, abdomen or back.  No recent chest pain, cough, fevers, chills, vomiting, diarrhea, dysuria, rash or other interim falls.  Patient notes she was previously on Eliquis but has been holding this after being seen by cardiology yesterday for preop appointment.         Past Medical History:  Diagnosis Date  . Anxiety   . Asthma   . Atherosclerotic peripheral vascular disease with intermittent claudication ([place])   . Cancer (HCC)    cervical  . Carotid artery occlusion   . CHF (congestive heart failure), NYHA class III ([place])   . CKD (chronic kidney disease), stage III ([place])   . COPD (chronic obstructive pulmonary disease) ([place])   . Coronary artery disease   . Current use of long term anticoagulation   . Depression   . Diabetes mellitus without complication ([place])   . GERD (gastroesophageal reflux disease)   . Gout   . HLD (hyperlipidemia)   . Hx of CABG   . Hypertension   .  Hypothyroidism   . Kidney stones   . LBBB (left bundle branch block)   . Myocardial infarction ([place])   . Subclavian steal syndrome     Patient Active Problem List   Diagnosis Date Noted  . ICD (implantable cardioverter-defibrillator) in place 11/02/2019  . Chronic systolic CHF (congestive heart failure), NYHA class 3 ([place]) 02/14/2019  . LBBB (left bundle branch block) 12/02/2018  . 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 Subclavian steal syndrome 01/12/2018  . Atherosclerotic peripheral vascular disease with intermittent claudication ([place]) 12/30/2017  . Pain in both upper extremities 12/30/2017  . Hyperlipidemia 12/30/2017  . Coronary artery disease involving native heart 12/30/2017  . Postoperative atrial fibrillation ([place]) 07/09/2017  . S/P CABG x 2 07/09/2017  . Stenosis of left subclavian artery ([place]) 07/07/2017  . History of non-ST elevation myocardial infarction (NSTEMI) 07/06/2017  . Presence of stent in coronary artery 07/06/2017  . Chest pain 07/03/2017  . NSTEMI (non-ST elevated myocardial infarction) ([place]) 07/03/2017  . Impingement syndrome of right shoulder region 03/06/2017  . Diabetes ([place]) 03/25/2016  . Carotid stenosis 03/25/2016  . Anxiety, generalized 08/24/2015  . Gastroesophageal reflux disease without esophagitis 07/13/2015  . DDD (degenerative disc disease), cervical 04/06/2015  . Cervical radiculitis 04/06/2015  . Type 2 diabetes mellitus without complications ([place]) 09/81/1914  . Carotid bruit 09/15/2011  . Symptoms involving cardiovascular system 09/15/2011  . Essential hypertension 09/11/2011  .  Dysthymic disorder 09/11/2011  . Hypothyroidism 09/11/2011  . Major depressive disorder, recurrent episode, moderate ([place]) 09/11/2011  . Dyslipidemia 03/18/2011    Past Surgical History:  Procedure Laterality Date  . ABDOMINAL HYSTERECTOMY     partial  . CHOLECYSTECTOMY    . COLONOSCOPY WITH PROPOFOL N/A 01/11/2020   Procedure:  COLONOSCOPY WITH PROPOFOL;  Surgeon: Toledo, Benay Pike, MD;  Location: [facility] ENDOSCOPY;  Service: Gastroenterology;  Laterality: N/A;  . CORONARY ANGIOPLASTY    . CORONARY ARTERY BYPASS GRAFT     double  . EYE SURGERY Bilateral 08/2009   cataracts  . HIATAL HERNIA REPAIR  1980s  . LEFT HEART CATH AND CORONARY ANGIOGRAPHY N/A 07/03/2017   Procedure: LEFT HEART CATH AND CORONARY ANGIOGRAPHY;  Surgeon: Corey Skains, MD;  Location: [place] [facility];  Service: Cardiovascular;  Laterality: N/A;  . LOWER EXTREMITY ANGIOGRAPHY Left 01/11/2018   Procedure: LOWER EXTREMITY ANGIOGRAPHY;  Surgeon: Algernon Huxley, MD;  Location: [place] [facility];  Service: Cardiovascular;  Laterality: Left;  . THYROID SURGERY     pt does not remember if removed or partial removal    Prior to Admission medications   Medication Sig Start Date End Date Taking? Authorizing Provider  Acetaminophen (TYLENOL PO) Take 500 mg by mouth.     [provider]  apixaban (ELIQUIS) 5 MG TABS tablet Take 5 mg by mouth daily. 03/05/20   [provider]  doxepin (SINEQUAN) 10 MG capsule Take by mouth. Two tablets at night 02/01/18 04/17/20  [provider]  empagliflozin (JARDIANCE) 10 MG TABS tablet Take 10 mg by mouth daily.    [provider]  ezetimibe (ZETIA) 10 MG tablet Take by mouth. 08/20/17 07/11/20  [provider]  ferrous sulfate 325 (65 FE) MG tablet Take 325 mg by mouth.  03/09/16   [provider]  fluticasone (FLONASE) 50 MCG/ACT nasal spray Place 2 sprays into the nose daily.    [provider]  Fluticasone-Salmeterol (ADVAIR) 250-50 MCG/DOSE AEPB Inhale 1 puff into the lungs daily. 08/13/18   [provider]  glipiZIDE (GLUCOTROL) 5 MG tablet Take by mouth daily before breakfast.    [provider]  levothyroxine (SYNTHROID) 137 MCG tablet Take 137 mcg by mouth daily. 10/29/17   [provider]  loratadine (CLARITIN) 10 MG  tablet Take 10 mg by mouth daily. 08/04/18   [provider]  losartan (COZAAR) 25 MG tablet Take by mouth. 12/02/18 04/17/20  [provider]  metFORMIN (GLUCOPHAGE) 500 MG tablet Take 500 mg by mouth 2 (two) times daily with a meal.    [provider]  metoprolol tartrate (LOPRESSOR) 25 MG tablet Take 1 tablet (25 mg total) by mouth 2 (two) times daily. 07/04/17   Fritzi Mandes, MD  Multiple Vitamin (MULTIVITAMIN) capsule Take 1 capsule by mouth daily.    [provider]  omega-3 acid ethyl esters (LOVAZA) 1 g capsule Take 2 g by mouth 2 (two) times daily.    [provider]  omeprazole (PRILOSEC) 20 MG capsule Take 20 mg by mouth 2 (two) times daily before a meal. 01/09/16   [provider]  oxyCODONE-acetaminophen (PERCOCET) 5-325 MG tablet Take 1 tablet by mouth every 4 (four) hours as needed for severe pain. 07/11/20 07/11/21  Blake Divine, MD  venlafaxine (EFFEXOR) 75 MG tablet Take 75 mg by mouth 2 (two) times daily with a meal.    [provider]    Allergies Ace inhibitors, Atorvastatin, Statins,  and Sulfa antibiotics  Family History  Problem Relation Age of Onset  . Hypertension Mother   . Hypertension Father   . Diabetes Sister   . Cancer Brother   . Diabetes Brother   . Breast cancer Neg Hx     Social History Social History   Tobacco Use  . Smoking status: Current Every Day Smoker    Packs/day: 1.00    Types: Cigarettes  . Smokeless tobacco: Never Used  Vaping Use  . Vaping Use: Never used  Substance Use Topics  . Alcohol use: No  . Drug use: No    Review of Systems  Review of Systems  Constitutional: Negative for chills and fever.  HENT: Negative for sore throat.   Eyes: Negative for pain.  Respiratory: Negative for cough and stridor.   Cardiovascular: Negative for chest pain.  Gastrointestinal: Negative for vomiting.  Genitourinary: Negative for dysuria.  Musculoskeletal: Positive for back pain (  chronic), joint pain ( L knee) and myalgias ( L hand).  Skin: Negative for rash.  Neurological: Negative for seizures, loss of consciousness and headaches.  Psychiatric/Behavioral: Negative for suicidal ideas.  All other systems reviewed and are negative.     ____________________________________________   PHYSICAL EXAM:  VITAL SIGNS: ED Triage Vitals  Enc Vitals Group     BP 07/18/20 1924 (!) 182/62     Pulse --      Resp 07/18/20 1924 18     Temp 07/18/20 1924 98 F (36.7 C)     Temp Source 07/18/20 1924 Oral     SpO2 07/18/20 1924 95 %     Weight 07/18/20 1925 144 lb 6.4 oz (65.5 kg)     Height 07/18/20 1925 5\' 3"  (1.6 m)     Head Circumference --      Peak Flow --      Pain Score 07/18/20 1925 6     Pain Loc --      Pain Edu? --      Excl. in [place]? --    Vitals:   07/18/20 1924 07/18/20 2127  BP: (!) 182/62 (!) 148/92  Pulse:  78  Resp: 18 18  Temp: 98 F (36.7 C) 98.1 F (36.7 C)  SpO2: 95% 97%   Physical Exam Vitals and nursing note reviewed.  Constitutional:      General: She is not in acute distress.    Appearance: She is well-developed.  HENT:     Head: Normocephalic and atraumatic.     Right Ear: External ear normal.     Left Ear: External ear normal.  Eyes:     Conjunctiva/sclera: Conjunctivae normal.  Cardiovascular:     Rate and Rhythm: Normal rate and regular rhythm.     Heart sounds: No murmur heard.   Pulmonary:     Effort: Pulmonary effort is normal. No respiratory distress.     Breath sounds: Normal breath sounds.  Abdominal:     Palpations: Abdomen is soft.     Tenderness: There is no abdominal tenderness.  Musculoskeletal:     Cervical back: Neck supple.  Skin:    General: Skin is warm and dry.     Capillary Refill: Capillary refill takes less than 2 seconds.  Neurological:     Mental Status: She is alert and oriented to person, place, and time.  Psychiatric:        Mood and Affect: Mood normal.     Cranial nerves II through  XII grossly intact.  Patient  has full and symmetric strength in her bilateral upper and lower extremities with exception of left knee where she is able to flex or extend.  She is able to flex and extend all digits in the left hand including at the distal interphalangeal and proximal interphalangeal joints.  She does have a laceration over the tip of the left fifth digit without involvement of the nailbed.  She has a large skin tear over the palmar aspect of the left hand just proximal to the thumb.  She has full range of motion and strength of the thumb.  No snuffbox tenderness.  2+ radial pulse.  No other obvious trauma to the left upper extremity.  After cleaning the patient's left pinky finger seems there is no significant laceration but it seems she took off a layer of superficial skin over the tuft of the finger.  There is a little bit of oozing but no significant ongoing bleeding or indication for suture repair at this time. ____________________________________________   LABS (all labs ordered are listed, but only abnormal results are displayed)  Labs Reviewed  BASIC METABOLIC PANEL - Abnormal; Notable for the following components:      Result Value   Glucose, Bld 131 (*)    GFR, Estimated 60 (*)    All other components within normal limits  CBC WITH DIFFERENTIAL/PLATELET  PROTIME-INR   ____________________________________________  EKG  ____________________________________________  RADIOLOGY  ED MD interpretation: CT head and C-spine are unremarkable for any acute injury.  Plain film left knee shows known injury but no subsequent acute injury.  Plain film the left hand shows no fracture dislocation.  Official radiology report(s): CT Head Wo Contrast  Result Date: 07/18/2020 CLINICAL DATA:  [place], loss of balance EXAM: CT HEAD WITHOUT CONTRAST TECHNIQUE: Contiguous axial images were obtained from the base of the skull through the vertex without intravenous contrast. COMPARISON:   05/28/2020 FINDINGS: Brain: Stable hypodensities within the periventricular white matter and bilateral basal ganglia consistent with chronic small vessel ischemic changes. No acute infarct or hemorrhage. Lateral ventricles and midline structures are unremarkable. No acute extra-axial fluid collections. No mass effect. Vascular: No hyperdense vessel or unexpected calcification. Skull: Normal. Negative for fracture or focal lesion. Sinuses/Orbits: No acute finding. Other: None. IMPRESSION: 1. Stable head CT, no acute intracranial process. Electronically Signed   By: Randa Ngo M.D.   On: 07/18/2020 20:03   CT Cervical Spine Wo Contrast  Result Date: 07/18/2020 CLINICAL DATA:  [place], loss of balance EXAM: CT CERVICAL SPINE WITHOUT CONTRAST TECHNIQUE: Multidetector CT imaging of the cervical spine was performed without intravenous contrast. Multiplanar CT image reconstructions were also generated. COMPARISON:  07/02/2017 FINDINGS: Alignment: There is slight reversal of normal cervical lordosis at the C6/C7 level, stable. Otherwise alignment remains anatomic. Skull base and vertebrae: No acute fracture. No primary bone lesion or focal pathologic process. Soft tissues and spinal canal: No prevertebral fluid or swelling. No visible canal hematoma. Extensive atherosclerosis within the carotid arteries. Disc levels: Significant left predominant facet hypertrophic changes are seen from C3 through C7. This results in left predominant neural foraminal encroachment at these levels. There is mild diffuse spondylosis, greatest at C6-7, with mild right predominant neural foraminal encroachment. Upper chest: Airway is patent. Chronic subpleural scarring at the right apex unchanged. Other: Reconstructed images demonstrate no additional findings. IMPRESSION: 1. No acute cervical spine fracture. 2. Multilevel spondylosis and facet hypertrophy as above. Electronically Signed   By: Randa Ngo M.D.   On: 07/18/2020 20:05  DG  Knee Complete 4 Views Left  Result Date: 07/18/2020 CLINICAL DATA:  [place], previous patellar fracture EXAM: LEFT KNEE - COMPLETE 4+ VIEW COMPARISON:  07/11/2020 FINDINGS: Frontal, bilateral oblique, and lateral views of the left knee are obtained. The distracted comminuted patellar fracture seen previously is unchanged. Persistent overlying soft tissue swelling. There are no new bony abnormalities. Joint spaces are well preserved. There is diffuse subcutaneous edema. IMPRESSION: 1. Stable comminuted distracted patellar fracture. 2. No new bony abnormality. Electronically Signed   By: Randa Ngo M.D.   On: 07/18/2020 19:53   DG Hand Complete Left  Result Date: 07/18/2020 CLINICAL DATA:  [place], lacerations EXAM: LEFT HAND - COMPLETE 3+ VIEW COMPARISON:  None. FINDINGS: Frontal, oblique, lateral views of the left hand are obtained. There are no acute displaced fractures. Diffuse osteoarthritis, with joint space narrowing and osteophyte formation greatest in the radial aspect of the carpus and at the first carpometacarpal joint. Soft tissues are unremarkable. No radiopaque foreign bodies. IMPRESSION: 1. Diffuse osteoarthritis.  No acute displaced fracture. Electronically Signed   By: Randa Ngo M.D.   On: 07/18/2020 19:54    ____________________________________________   PROCEDURES  Procedure(s) performed (including Critical Care):  Procedures   ____________________________________________   INITIAL IMPRESSION / ASSESSMENT AND PLAN / ED COURSE      Patient presents with above history exam for assessment of some pain in her left hand after she had a mechanical fall while getting up from a toilet earlier today.  On arrival she is hypertensive with a BP of 182/62 with otherwise stable vital signs on room air.  She is weak in her left knee where she has some pain from prior injury and fracture and otherwise no other obvious trauma to the lower extremities or right upper extremity.  She does  have a skin tear over the palmar aspect of the left hand and laceration over the distal tip of the fifth digit but is otherwise neurovascularly intact in all extremities.  No other recent sick symptoms or historical or exam factors to suggest a syncopal etiology for fall.  CT head and C-spine unremarkable for acute intracranial or cervical injury.  Plain film left hand shows no acute fracture dislocation I have a low suspicion for occult fracture at this time.  Left knee film shows no acute injury from prior.  Tetanus updated.  Wound washed with warm soapy water.  Bacitracin applied to fifth digit skin tear and some tape applied to skin tear over the palm.  Low suspicion for other significant occult injury.  Patient discharged stable condition.  Strict return precautions advised and discussed.      ____________________________________________   FINAL CLINICAL IMPRESSION(S) / ED DIAGNOSES  Final diagnoses:  Fall, initial encounter  Laceration of left little finger without foreign body without damage to nail, initial encounter    Medications  morphine 4 MG/ML injection 4 mg (4 mg Intravenous Given 07/18/20 2027)  Tdap (BOOSTRIX) injection 0.5 mL (0.5 mLs Intramuscular Given 07/18/20 2042)  bacitracin ointment ( Topical Given 07/18/20 2127)     ED Discharge Orders    None       Note:  This document was prepared using Dragon voice recognition software and may include unintentional dictation errors.   Lucrezia Starch, MD 07/19/20 Berniece Salines

## 2020-07-18 NOTE — ED Triage Notes (Signed)
Reported via EMS d/t mechanical fall in bathroom today. Reports she lost balance, hit head, denies LOC. Lacerations noted to left hand. Pt schedule for surgery on L Knee tomorrow due to recent fall.

## 2020-07-21 ENCOUNTER — Encounter: Payer: Self-pay | Admitting: Emergency Medicine

## 2020-07-21 ENCOUNTER — Other Ambulatory Visit: Payer: Self-pay

## 2020-07-21 ENCOUNTER — Emergency Department: Payer: Medicare Other

## 2020-07-21 ENCOUNTER — Inpatient Hospital Stay
Admission: EM | Admit: 2020-07-21 | Discharge: 2020-07-25 | DRG: 291 | Disposition: A | Discharge status: Home Health | Payer: Medicare Other | Attending: Internal Medicine | Admitting: Internal Medicine

## 2020-07-21 DIAGNOSIS — J449 Chronic obstructive pulmonary disease, unspecified: Secondary | ICD-10-CM | POA: Diagnosis present

## 2020-07-21 DIAGNOSIS — I700 Atherosclerosis of aorta: Secondary | ICD-10-CM | POA: Diagnosis present

## 2020-07-21 DIAGNOSIS — Z809 Family history of malignant neoplasm, unspecified: Secondary | ICD-10-CM | POA: Diagnosis not present

## 2020-07-21 DIAGNOSIS — Z20822 Contact with and (suspected) exposure to covid-19: Secondary | ICD-10-CM | POA: Diagnosis present

## 2020-07-21 DIAGNOSIS — Z7901 Long term (current) use of anticoagulants: Secondary | ICD-10-CM

## 2020-07-21 DIAGNOSIS — E1169 Type 2 diabetes mellitus with other specified complication: Secondary | ICD-10-CM | POA: Diagnosis not present

## 2020-07-21 DIAGNOSIS — I5023 Acute on chronic systolic (congestive) heart failure: Secondary | ICD-10-CM | POA: Diagnosis present

## 2020-07-21 DIAGNOSIS — F411 Generalized anxiety disorder: Secondary | ICD-10-CM | POA: Diagnosis present

## 2020-07-21 DIAGNOSIS — I130 Hypertensive heart and chronic kidney disease with heart failure and stage 1 through stage 4 chronic kidney disease, or unspecified chronic kidney disease: Secondary | ICD-10-CM | POA: Diagnosis present

## 2020-07-21 DIAGNOSIS — Z87442 Personal history of urinary calculi: Secondary | ICD-10-CM

## 2020-07-21 DIAGNOSIS — Z95810 Presence of automatic (implantable) cardiac defibrillator: Secondary | ICD-10-CM

## 2020-07-21 DIAGNOSIS — D3502 Benign neoplasm of left adrenal gland: Secondary | ICD-10-CM | POA: Diagnosis present

## 2020-07-21 DIAGNOSIS — F17200 Nicotine dependence, unspecified, uncomplicated: Secondary | ICD-10-CM | POA: Diagnosis present

## 2020-07-21 DIAGNOSIS — E1165 Type 2 diabetes mellitus with hyperglycemia: Secondary | ICD-10-CM | POA: Diagnosis present

## 2020-07-21 DIAGNOSIS — Z951 Presence of aortocoronary bypass graft: Secondary | ICD-10-CM

## 2020-07-21 DIAGNOSIS — I6529 Occlusion and stenosis of unspecified carotid artery: Secondary | ICD-10-CM | POA: Diagnosis present

## 2020-07-21 DIAGNOSIS — Z888 Allergy status to other drugs, medicaments and biological substances status: Secondary | ICD-10-CM

## 2020-07-21 DIAGNOSIS — E1122 Type 2 diabetes mellitus with diabetic chronic kidney disease: Secondary | ICD-10-CM

## 2020-07-21 DIAGNOSIS — Z23 Encounter for immunization: Secondary | ICD-10-CM | POA: Diagnosis present

## 2020-07-21 DIAGNOSIS — I447 Left bundle-branch block, unspecified: Secondary | ICD-10-CM | POA: Diagnosis present

## 2020-07-21 DIAGNOSIS — Z9049 Acquired absence of other specified parts of digestive tract: Secondary | ICD-10-CM

## 2020-07-21 DIAGNOSIS — E876 Hypokalemia: Secondary | ICD-10-CM | POA: Diagnosis not present

## 2020-07-21 DIAGNOSIS — Z955 Presence of coronary angioplasty implant and graft: Secondary | ICD-10-CM

## 2020-07-21 DIAGNOSIS — N181 Chronic kidney disease, stage 1: Secondary | ICD-10-CM | POA: Diagnosis present

## 2020-07-21 DIAGNOSIS — R0602 Shortness of breath: Secondary | ICD-10-CM | POA: Diagnosis present

## 2020-07-21 DIAGNOSIS — I2510 Atherosclerotic heart disease of native coronary artery without angina pectoris: Secondary | ICD-10-CM | POA: Diagnosis present

## 2020-07-21 DIAGNOSIS — Z882 Allergy status to sulfonamides status: Secondary | ICD-10-CM

## 2020-07-21 DIAGNOSIS — I255 Ischemic cardiomyopathy: Secondary | ICD-10-CM | POA: Diagnosis present

## 2020-07-21 DIAGNOSIS — R0600 Dyspnea, unspecified: Secondary | ICD-10-CM

## 2020-07-21 DIAGNOSIS — I70219 Atherosclerosis of native arteries of extremities with intermittent claudication, unspecified extremity: Secondary | ICD-10-CM | POA: Diagnosis present

## 2020-07-21 DIAGNOSIS — Z7951 Long term (current) use of inhaled steroids: Secondary | ICD-10-CM

## 2020-07-21 DIAGNOSIS — E039 Hypothyroidism, unspecified: Secondary | ICD-10-CM | POA: Diagnosis present

## 2020-07-21 DIAGNOSIS — N183 Chronic kidney disease, stage 3 unspecified: Secondary | ICD-10-CM | POA: Diagnosis not present

## 2020-07-21 DIAGNOSIS — F32A Depression, unspecified: Secondary | ICD-10-CM | POA: Diagnosis present

## 2020-07-21 DIAGNOSIS — K219 Gastro-esophageal reflux disease without esophagitis: Secondary | ICD-10-CM | POA: Diagnosis present

## 2020-07-21 DIAGNOSIS — Z79899 Other long term (current) drug therapy: Secondary | ICD-10-CM

## 2020-07-21 DIAGNOSIS — Z7984 Long term (current) use of oral hypoglycemic drugs: Secondary | ICD-10-CM

## 2020-07-21 DIAGNOSIS — I252 Old myocardial infarction: Secondary | ICD-10-CM

## 2020-07-21 DIAGNOSIS — Z90710 Acquired absence of both cervix and uterus: Secondary | ICD-10-CM | POA: Diagnosis not present

## 2020-07-21 DIAGNOSIS — I5021 Acute systolic (congestive) heart failure: Secondary | ICD-10-CM | POA: Diagnosis not present

## 2020-07-21 DIAGNOSIS — Z833 Family history of diabetes mellitus: Secondary | ICD-10-CM | POA: Diagnosis not present

## 2020-07-21 DIAGNOSIS — I509 Heart failure, unspecified: Secondary | ICD-10-CM | POA: Insufficient documentation

## 2020-07-21 DIAGNOSIS — F17210 Nicotine dependence, cigarettes, uncomplicated: Secondary | ICD-10-CM | POA: Diagnosis present

## 2020-07-21 DIAGNOSIS — Z8249 Family history of ischemic heart disease and other diseases of the circulatory system: Secondary | ICD-10-CM

## 2020-07-21 DIAGNOSIS — E785 Hyperlipidemia, unspecified: Secondary | ICD-10-CM | POA: Diagnosis present

## 2020-07-21 DIAGNOSIS — I480 Paroxysmal atrial fibrillation: Secondary | ICD-10-CM | POA: Diagnosis present

## 2020-07-21 DIAGNOSIS — I10 Essential (primary) hypertension: Secondary | ICD-10-CM | POA: Diagnosis present

## 2020-07-21 DIAGNOSIS — Z79890 Hormone replacement therapy (postmenopausal): Secondary | ICD-10-CM

## 2020-07-21 LAB — CBC
HCT: 33.2 % — ABNORMAL LOW (ref 36.0–46.0)
Hemoglobin: 11.1 g/dL — ABNORMAL LOW (ref 12.0–15.0)
MCH: 32.1 pg (ref 26.0–34.0)
MCHC: 33.4 g/dL (ref 30.0–36.0)
MCV: 96 fL (ref 80.0–100.0)
Platelets: 162 10*3/uL (ref 150–400)
RBC: 3.46 MIL/uL — ABNORMAL LOW (ref 3.87–5.11)
RDW: 14.1 % (ref 11.5–15.5)
WBC: 8 10*3/uL (ref 4.0–10.5)
nRBC: 0 % (ref 0.0–0.2)

## 2020-07-21 LAB — COMPREHENSIVE METABOLIC PANEL
ALT: 18 U/L (ref 0–44)
AST: 25 U/L (ref 15–41)
Albumin: 3.1 g/dL — ABNORMAL LOW (ref 3.5–5.0)
Alkaline Phosphatase: 47 U/L (ref 38–126)
Anion gap: 9 (ref 5–15)
BUN: 11 mg/dL (ref 8–23)
CO2: 24 mmol/L (ref 22–32)
Calcium: 9 mg/dL (ref 8.9–10.3)
Chloride: 102 mmol/L (ref 98–111)
Creatinine, Ser: 0.88 mg/dL (ref 0.44–1.00)
GFR, Estimated: >60 mL/min (ref >60)
Glucose, Bld: 187 mg/dL — ABNORMAL HIGH (ref 70–99)
Potassium: 3.3 mmol/L — ABNORMAL LOW (ref 3.5–5.1)
Sodium: 135 mmol/L (ref 135–145)
Total Bilirubin: 1 mg/dL (ref 0.3–1.2)
Total Protein: 6.6 g/dL (ref 6.5–8.1)

## 2020-07-21 LAB — RESP PANEL BY RT-PCR (FLU A&B, COVID) ARPGX2
Influenza A by PCR: NEGATIVE
Influenza B by PCR: NEGATIVE
SARS Coronavirus 2 by RT PCR: NEGATIVE

## 2020-07-21 LAB — BRAIN NATRIURETIC PEPTIDE: B Natriuretic Peptide: 758.2 pg/mL — ABNORMAL HIGH (ref 0.0–100.0)

## 2020-07-21 LAB — TROPONIN I (HIGH SENSITIVITY)
Troponin I (High Sensitivity): 59 ng/L — ABNORMAL HIGH (ref <18)
Troponin I (High Sensitivity): 78 ng/L — ABNORMAL HIGH (ref <18)
Troponin I (High Sensitivity): 97 ng/L — ABNORMAL HIGH (ref <18)
Troponin I (High Sensitivity): 111 ng/L (ref <18)

## 2020-07-21 MED ORDER — METHOCARBAMOL 500 MG PO TABS
500.0000 mg | ORAL_TABLET | Freq: Three times a day (TID) | ORAL | Status: DC | PRN
  Administered 2020-07-21 – 2020-07-24 (×5): 500 mg via ORAL
  Filled 2020-07-21 (×6): qty 1

## 2020-07-21 MED ORDER — LEVOTHYROXINE SODIUM 137 MCG PO TABS
137.0000 ug | ORAL_TABLET | Freq: Every day | ORAL | Status: DC
  Administered 2020-07-22 – 2020-07-25 (×4): 137 ug via ORAL
  Filled 2020-07-21 (×4): qty 1

## 2020-07-21 MED ORDER — SODIUM CHLORIDE 0.9% FLUSH
3.0000 mL | Freq: Two times a day (BID) | INTRAVENOUS | Status: DC
  Administered 2020-07-21 – 2020-07-25 (×8): 3 mL via INTRAVENOUS

## 2020-07-21 MED ORDER — MOMETASONE FURO-FORMOTEROL FUM 200-5 MCG/ACT IN AERO
2.0000 | INHALATION_SPRAY | Freq: Two times a day (BID) | RESPIRATORY_TRACT | Status: DC
  Administered 2020-07-22 – 2020-07-25 (×7): 2 via RESPIRATORY_TRACT
  Filled 2020-07-21: qty 8.8

## 2020-07-21 MED ORDER — SODIUM CHLORIDE 0.9 % IV SOLN: 250.0000 mL | INTRAVENOUS | Status: DC | PRN

## 2020-07-21 MED ORDER — APIXABAN 5 MG PO TABS: 5.0000 mg | ORAL_TABLET | Freq: Every day | ORAL | Status: DC

## 2020-07-21 MED ORDER — IOHEXOL 350 MG/ML SOLN
75.0000 mL | Freq: Once | INTRAVENOUS | Status: AC | PRN
  Administered 2020-07-21: 75 mL via INTRAVENOUS

## 2020-07-21 MED ORDER — FLUTICASONE PROPIONATE 50 MCG/ACT NA SUSP
2.0000 | Freq: Every day | NASAL | Status: DC
  Administered 2020-07-21 – 2020-07-25 (×4): 2 via NASAL
  Filled 2020-07-21 (×2): qty 16

## 2020-07-21 MED ORDER — MORPHINE SULFATE (PF) 2 MG/ML IV SOLN
2.0000 mg | Freq: Once | INTRAVENOUS | Status: AC
  Administered 2020-07-21: 2 mg via INTRAVENOUS
  Filled 2020-07-21: qty 1

## 2020-07-21 MED ORDER — EZETIMIBE 10 MG PO TABS
10.0000 mg | ORAL_TABLET | Freq: Every day | ORAL | Status: DC
  Administered 2020-07-21 – 2020-07-25 (×5): 10 mg via ORAL
  Filled 2020-07-21 (×5): qty 1

## 2020-07-21 MED ORDER — FUROSEMIDE 10 MG/ML IJ SOLN
60.0000 mg | Freq: Once | INTRAMUSCULAR | Status: AC
  Administered 2020-07-21: 60 mg via INTRAVENOUS
  Filled 2020-07-21: qty 8

## 2020-07-21 MED ORDER — VENLAFAXINE HCL 37.5 MG PO TABS
75.0000 mg | ORAL_TABLET | Freq: Two times a day (BID) | ORAL | Status: DC
  Administered 2020-07-21 – 2020-07-25 (×8): 75 mg via ORAL
  Filled 2020-07-21 (×9): qty 2

## 2020-07-21 MED ORDER — FENTANYL CITRATE (PF) 100 MCG/2ML IJ SOLN
50.0000 ug | Freq: Once | INTRAMUSCULAR | Status: AC
Start: 2020-07-21 — End: 2020-07-21
  Administered 2020-07-21: 50 ug via INTRAVENOUS
  Filled 2020-07-21: qty 2

## 2020-07-21 MED ORDER — PANTOPRAZOLE SODIUM 40 MG PO TBEC
40.0000 mg | DELAYED_RELEASE_TABLET | Freq: Every day | ORAL | Status: DC
  Administered 2020-07-21 – 2020-07-25 (×5): 40 mg via ORAL
  Filled 2020-07-21 (×5): qty 1

## 2020-07-21 MED ORDER — APIXABAN 5 MG PO TABS
5.0000 mg | ORAL_TABLET | Freq: Two times a day (BID) | ORAL | Status: DC
  Administered 2020-07-21 – 2020-07-25 (×8): 5 mg via ORAL
  Filled 2020-07-21 (×8): qty 1

## 2020-07-21 MED ORDER — APIXABAN 5 MG PO TABS: 5.0000 mg | ORAL_TABLET | Freq: Two times a day (BID) | ORAL | Status: DC

## 2020-07-21 MED ORDER — LORATADINE 10 MG PO TABS
10.0000 mg | ORAL_TABLET | Freq: Every day | ORAL | Status: DC
  Administered 2020-07-21 – 2020-07-25 (×6): 10 mg via ORAL
  Filled 2020-07-21 (×6): qty 1

## 2020-07-21 MED ORDER — ONDANSETRON HCL 4 MG/2ML IJ SOLN
4.0000 mg | Freq: Four times a day (QID) | INTRAMUSCULAR | Status: DC | PRN
  Administered 2020-07-21: 4 mg via INTRAVENOUS
  Filled 2020-07-21: qty 2

## 2020-07-21 MED ORDER — LOSARTAN POTASSIUM 25 MG PO TABS
25.0000 mg | ORAL_TABLET | Freq: Every day | ORAL | Status: DC
  Administered 2020-07-21 – 2020-07-25 (×4): 25 mg via ORAL
  Filled 2020-07-21 (×5): qty 1

## 2020-07-21 MED ORDER — ACETAMINOPHEN 500 MG PO TABS
500.0000 mg | ORAL_TABLET | Freq: Four times a day (QID) | ORAL | Status: DC | PRN
  Administered 2020-07-21 – 2020-07-22 (×2): 500 mg via ORAL
  Filled 2020-07-21 (×2): qty 1

## 2020-07-21 MED ORDER — SODIUM CHLORIDE 0.9% FLUSH
3.0000 mL | INTRAVENOUS | Status: DC | PRN
  Administered 2020-07-21 – 2020-07-23 (×2): 3 mL via INTRAVENOUS

## 2020-07-21 MED ORDER — FUROSEMIDE 10 MG/ML IJ SOLN
40.0000 mg | Freq: Every day | INTRAMUSCULAR | Status: DC
  Administered 2020-07-22 – 2020-07-25 (×4): 40 mg via INTRAVENOUS
  Filled 2020-07-21 (×4): qty 4

## 2020-07-21 MED ORDER — ADULT MULTIVITAMIN W/MINERALS CH
1.0000 | ORAL_TABLET | Freq: Every day | ORAL | Status: DC
  Administered 2020-07-21 – 2020-07-25 (×5): 1 via ORAL
  Filled 2020-07-21 (×6): qty 1

## 2020-07-21 MED ORDER — POTASSIUM CHLORIDE CRYS ER 20 MEQ PO TBCR
40.0000 meq | EXTENDED_RELEASE_TABLET | Freq: Once | ORAL | Status: AC
  Administered 2020-07-21: 40 meq via ORAL
  Filled 2020-07-21: qty 2

## 2020-07-21 MED ORDER — FERROUS SULFATE 325 (65 FE) MG PO TABS
325.0000 mg | ORAL_TABLET | Freq: Two times a day (BID) | ORAL | Status: DC
  Administered 2020-07-21 – 2020-07-25 (×8): 325 mg via ORAL
  Filled 2020-07-21 (×8): qty 1

## 2020-07-21 MED ORDER — OMEGA-3-ACID ETHYL ESTERS 1 G PO CAPS
2.0000 g | ORAL_CAPSULE | Freq: Two times a day (BID) | ORAL | Status: DC
  Administered 2020-07-21 – 2020-07-25 (×9): 2 g via ORAL
  Filled 2020-07-21 (×11): qty 2

## 2020-07-21 MED ORDER — FENTANYL CITRATE (PF) 100 MCG/2ML IJ SOLN
50.0000 ug | Freq: Once | INTRAMUSCULAR | Status: AC
  Administered 2020-07-21: 50 ug via INTRAVENOUS
  Filled 2020-07-21: qty 2

## 2020-07-21 MED ORDER — OXYCODONE-ACETAMINOPHEN 5-325 MG PO TABS
1.0000 | ORAL_TABLET | ORAL | Status: DC | PRN
  Administered 2020-07-21 – 2020-07-25 (×11): 1 via ORAL
  Filled 2020-07-21 (×12): qty 1

## 2020-07-21 MED ORDER — METOPROLOL TARTRATE 25 MG PO TABS
25.0000 mg | ORAL_TABLET | Freq: Two times a day (BID) | ORAL | Status: DC
  Administered 2020-07-21 – 2020-07-24 (×7): 25 mg via ORAL
  Filled 2020-07-21 (×9): qty 1

## 2020-07-21 NOTE — ED Notes (Signed)
Informed Rn bed assigned [id number]

## 2020-07-21 NOTE — Consult Note (Signed)
[facility]  CARDIOLOGY CONSULT NOTE  Patient ID: Christine Obrien MRN: [id number] DOB/AGE: Aug 16, 1940 80 y.o.  Admit date: 07/21/2020 Referring Physician [place] Primary Physician [facility] Primary Cardiologist Nehemiah Massed Reason for Consultation acute on chronic systolic congestive heart failure  HPI: 80 year old female with her for evaluation of acute on chronic systolic congestive heart failure.  The patient has known history of coronary artery disease, ischemic cardiomyopathy, and chronic systolic congestive heart failure.  She underwent recent left patella repair.  She presents with worsening exertional dyspnea but denies chest pain, or pedal edema.  Admission labs notable for borderline elevated troponin of 59 and 78.  ECG revealed sinus tachycardia with left bundle branch block.  Chest x-ray revealed small bilateral pleural effusions without evidence for pulmonary edema.  Chest CT did not reveal evidence for pulmonary emboli.  Patient reports overall clinical improvement on supplemental O2 and after initial diuresis.  The patient has known coronary artery disease, with history of non-STEMI and multiple stents 03/20/2006.  She underwent two-vessel CABG with LIMA to LAD and SVG to OM1 at [facility] on 07/08/2017.  He is status post ICD for primary prevention.  2D echocardiogram 02/27/2020 revealed LVEF of 35%.  Lexiscan Myoview 07/17/2020 revealed LVEF of 33% without evidence for scar or ischemia.  Review of systems complete and found to be negative unless listed above     Past Medical History:  Diagnosis Date  . Anxiety   . Asthma   . Atherosclerotic peripheral vascular disease with intermittent claudication ([place])   . Cancer (HCC)    cervical  . Carotid artery occlusion   . CHF (congestive heart failure), NYHA class III ([place])   . CKD (chronic kidney disease), stage III ([place])   . COPD (chronic obstructive pulmonary disease) ([place])   . Coronary artery disease   . Current use of long term  anticoagulation   . Depression   . Diabetes mellitus without complication ([place])   . GERD (gastroesophageal reflux disease)   . Gout   . HLD (hyperlipidemia)   . Hx of CABG   . Hypertension   . Hypothyroidism   . Kidney stones   . LBBB (left bundle branch block)   . Myocardial infarction ([place])   . Subclavian steal syndrome     Past Surgical History:  Procedure Laterality Date  . ABDOMINAL HYSTERECTOMY     partial  . CHOLECYSTECTOMY    . COLONOSCOPY WITH PROPOFOL N/A 01/11/2020   Procedure: COLONOSCOPY WITH PROPOFOL;  Surgeon: Toledo, Benay Pike, MD;  Location: [facility] ENDOSCOPY;  Service: Gastroenterology;  Laterality: N/A;  . CORONARY ANGIOPLASTY    . CORONARY ARTERY BYPASS GRAFT     double  . EYE SURGERY Bilateral 08/2009   cataracts  . HIATAL HERNIA REPAIR  1980s  . LEFT HEART CATH AND CORONARY ANGIOGRAPHY N/A 07/03/2017   Procedure: LEFT HEART CATH AND CORONARY ANGIOGRAPHY;  Surgeon: Corey Skains, MD;  Location: [place] [facility];  Service: Cardiovascular;  Laterality: N/A;  . LOWER EXTREMITY ANGIOGRAPHY Left 01/11/2018   Procedure: LOWER EXTREMITY ANGIOGRAPHY;  Surgeon: Algernon Huxley, MD;  Location: [place] [facility];  Service: Cardiovascular;  Laterality: Left;  . THYROID SURGERY     pt does not remember if removed or partial removal    (Not in a hospital admission)  Social History   Socioeconomic History  . Marital status: Widowed    Spouse name: Not on file  . Number of children: Not on file  . Years of education:  Not on file  . Highest education level: Not on file  Occupational History  . Not on file  Tobacco Use  . Smoking status: Current Every Day Smoker    Packs/day: 1.00    Types: Cigarettes  . Smokeless tobacco: Never Used  Vaping Use  . Vaping Use: Never used  Substance and Sexual Activity  . Alcohol use: No  . Drug use: No  . Sexual activity: Not on file  Other Topics Concern  . Not on file  Social History Narrative  . Not on file    Social Determinants of Health   Financial Resource Strain: Not on file  Food Insecurity: Not on file  Transportation Needs: Not on file  Physical Activity: Not on file  Stress: Not on file  Social Connections: Not on file  Intimate Partner Violence: Not on file    Family History  Problem Relation Age of Onset  . Hypertension Mother   . Hypertension Father   . Diabetes Sister   . Cancer Brother   . Diabetes Brother   . Breast cancer Neg Hx       Review of systems complete and found to be negative unless listed above      PHYSICAL EXAM  General: Well developed, well nourished, in no acute distress HEENT:  Normocephalic and atramatic Neck:  No JVD.  Lungs: Clear bilaterally to auscultation and percussion. Heart: HRRR . Normal S1 and S2 without gallops or murmurs.  Abdomen: Bowel sounds are positive, abdomen soft and non-tender  Msk:  Back normal, normal gait. Normal strength and tone for age. Extremities: No clubbing, cyanosis or edema.   Neuro: Alert and oriented X 3. Psych:  Good affect, responds appropriately  Labs:   Lab Results  Component Value Date   WBC 8.0 07/21/2020   HGB 11.1 (L) 07/21/2020   HCT 33.2 (L) 07/21/2020   MCV 96.0 07/21/2020   PLT 162 07/21/2020    Recent Labs  Lab 07/21/20 0818  NA 135  K 3.3*  CL 102  CO2 24  BUN 11  CREATININE 0.88  CALCIUM 9.0  PROT 6.6  BILITOT 1.0  ALKPHOS 47  ALT 18  AST 25  GLUCOSE 187*   Lab Results  Component Value Date   CKTOTAL 149 03/31/2013   CKMB 16.0 (H) 03/31/2013   TROPONINI 0.17 (HH) 07/03/2017    Lab Results  Component Value Date   CHOL 223 (H) 07/03/2017   CHOL 175 03/30/2013   Lab Results  Component Value Date   HDL 41 07/03/2017   HDL 41 03/30/2013   Lab Results  Component Value Date   LDLCALC 150 (H) 07/03/2017   LDLCALC 102 (H) 03/30/2013   Lab Results  Component Value Date   TRIG 160 (H) 07/03/2017   TRIG 161 03/30/2013   Lab Results  Component Value Date    CHOLHDL 5.4 07/03/2017   No results found for: LDLDIRECT    Radiology: DG Knee 2 Views Left  Result Date: 07/11/2020 CLINICAL DATA:  Fall, pain EXAM: LEFT KNEE - 1-2 VIEW COMPARISON:  None FINDINGS: There is a displaced fracture through the mid to lower pole of the left patella. Fracture fragments are distracted approximately 3-4 cm. Overlying soft tissue swelling. No additional fracture. No subluxation or dislocation. No significant joint effusion. IMPRESSION: Left patellar fracture with significantly displaced fracture fragments. Electronically Signed   By: Rolm Baptise M.D.   On: 07/11/2020 17:50   CT Head Wo Contrast  Result Date: 07/18/2020  CLINICAL DATA:  [place], loss of balance EXAM: CT HEAD WITHOUT CONTRAST TECHNIQUE: Contiguous axial images were obtained from the base of the skull through the vertex without intravenous contrast. COMPARISON:  05/28/2020 FINDINGS: Brain: Stable hypodensities within the periventricular white matter and bilateral basal ganglia consistent with chronic small vessel ischemic changes. No acute infarct or hemorrhage. Lateral ventricles and midline structures are unremarkable. No acute extra-axial fluid collections. No mass effect. Vascular: No hyperdense vessel or unexpected calcification. Skull: Normal. Negative for fracture or focal lesion. Sinuses/Orbits: No acute finding. Other: None. IMPRESSION: 1. Stable head CT, no acute intracranial process. Electronically Signed   By: Randa Ngo M.D.   On: 07/18/2020 20:03   CT Angio Chest PE W and/or Wo Contrast  Result Date: 07/21/2020 CLINICAL DATA:  Left knee surgery 2 days ago. Increased weakness and shortness of breath this morning. Possible pulmonary embolism. EXAM: CT ANGIOGRAPHY CHEST WITH CONTRAST TECHNIQUE: Multidetector CT imaging of the chest was performed using the standard protocol during bolus administration of intravenous contrast. Multiplanar CT image reconstructions and MIPs were obtained to evaluate the  vascular anatomy. CONTRAST:  52mL OMNIPAQUE IOHEXOL 350 MG/ML SOLN COMPARISON:  Neck CT 05/28/2020 and noncontrast abdominal CT 06/12/2012 FINDINGS: Cardiovascular: Mild cardiomegaly. Left-sided pacemaker is present. Sternotomy wires are present. Previous CABG. Thoracic aorta is normal in caliber. There is calcified plaque over the thoracic aorta. Pulmonary arterial system is well opacified and demonstrates no evidence of pulmonary embolism. Mediastinum/Nodes: No significant mediastinal or hilar adenopathy. Remaining mediastinal structures are unremarkable. Lungs/Pleura: Lungs are adequately inflated with small bilateral pleural effusions and associated bibasilar atelectasis. Subtle peripheral patchy density over the lingula likely atelectasis. There is focal airspace density over the right suprahilar region which may be due to atelectasis or possibly infection. Mild nodular biapical pleural thickening right worse than left with continuous second area of nodularity over the posterior right upper lobe measuring 0.8 x 1.8 cm unchanged from recent neck CT 05/28/2020. Findings likely due to chronic scarring, although indeterminate. Airways are normal. Upper Abdomen: Calcified plaque over the abdominal aorta. Stable left adrenal adenoma. No acute findings. Musculoskeletal: Degenerative change of the spine. Review of the MIP images confirms the above findings. IMPRESSION: 1. No evidence of pulmonary embolism. 2. Small bilateral pleural effusions with associated bibasilar atelectasis. Focal airspace density over the right suprahilar region which may be due to atelectasis or possibly infection. 3. Nodular biapical pleural thickening right worse than left with continuous second area of nodularity over the posterior right upper lobe measuring 0.8 x 1.8 cm unchanged from recent neck CT 05/28/2020 and likely due to chronic scarring, although indeterminate. Recommend follow-up chest CT 3 months versus PET-CT for further  evaluation. 4. Aortic atherosclerosis. Stable left adrenal adenoma. Aortic Atherosclerosis (ICD10-I70.0). Electronically Signed   By: Marin Olp M.D.   On: 07/21/2020 11:00   CT Cervical Spine Wo Contrast  Result Date: 07/18/2020 CLINICAL DATA:  [place], loss of balance EXAM: CT CERVICAL SPINE WITHOUT CONTRAST TECHNIQUE: Multidetector CT imaging of the cervical spine was performed without intravenous contrast. Multiplanar CT image reconstructions were also generated. COMPARISON:  07/02/2017 FINDINGS: Alignment: There is slight reversal of normal cervical lordosis at the C6/C7 level, stable. Otherwise alignment remains anatomic. Skull base and vertebrae: No acute fracture. No primary bone lesion or focal pathologic process. Soft tissues and spinal canal: No prevertebral fluid or swelling. No visible canal hematoma. Extensive atherosclerosis within the carotid arteries. Disc levels: Significant left predominant facet hypertrophic changes are seen from C3 through C7.  This results in left predominant neural foraminal encroachment at these levels. There is mild diffuse spondylosis, greatest at C6-7, with mild right predominant neural foraminal encroachment. Upper chest: Airway is patent. Chronic subpleural scarring at the right apex unchanged. Other: Reconstructed images demonstrate no additional findings. IMPRESSION: 1. No acute cervical spine fracture. 2. Multilevel spondylosis and facet hypertrophy as above. Electronically Signed   By: Randa Ngo M.D.   On: 07/18/2020 20:05   DG Chest Portable 1 View  Result Date: 07/21/2020 CLINICAL DATA:  Dyspnea EXAM: PORTABLE CHEST 1 VIEW COMPARISON:  07/02/2017 chest radiograph. FINDINGS: Intact sternotomy wires. Two lead left subclavian ICD with lead tips overlying the right atrium and right ventricle. Surgical clips overlie the left mediastinum. Stable cardiomediastinal silhouette with mild cardiomegaly. No pneumothorax. No pleural effusion. Cephalization of the  pulmonary vasculature without overt pulmonary edema. Mild curvilinear bibasilar scarring versus atelectasis. IMPRESSION: 1. Mild cardiomegaly without overt pulmonary edema. 2. Mild curvilinear bibasilar scarring versus atelectasis. Electronically Signed   By: Ilona Sorrel M.D.   On: 07/21/2020 08:40   DG Knee Complete 4 Views Left  Result Date: 07/18/2020 CLINICAL DATA:  [place], previous patellar fracture EXAM: LEFT KNEE - COMPLETE 4+ VIEW COMPARISON:  07/11/2020 FINDINGS: Frontal, bilateral oblique, and lateral views of the left knee are obtained. The distracted comminuted patellar fracture seen previously is unchanged. Persistent overlying soft tissue swelling. There are no new bony abnormalities. Joint spaces are well preserved. There is diffuse subcutaneous edema. IMPRESSION: 1. Stable comminuted distracted patellar fracture. 2. No new bony abnormality. Electronically Signed   By: Randa Ngo M.D.   On: 07/18/2020 19:53   DG Hand Complete Left  Result Date: 07/18/2020 CLINICAL DATA:  [place], lacerations EXAM: LEFT HAND - COMPLETE 3+ VIEW COMPARISON:  None. FINDINGS: Frontal, oblique, lateral views of the left hand are obtained. There are no acute displaced fractures. Diffuse osteoarthritis, with joint space narrowing and osteophyte formation greatest in the radial aspect of the carpus and at the first carpometacarpal joint. Soft tissues are unremarkable. No radiopaque foreign bodies. IMPRESSION: 1. Diffuse osteoarthritis.  No acute displaced fracture. Electronically Signed   By: Randa Ngo M.D.   On: 07/18/2020 19:54    EKG: Sinus tachycardia with left bundle branch block  ASSESSMENT AND PLAN:   1.  Acute on chronic systolic congestive heart failure, clinically improved after initial diuresis, known moderate reduced left ventricular function, with LVEF of 35% by 2D echocardiogram 02/27/2020 2.  Borderline elevated high-sensitivity troponin (59, 78), in the absence of chest pain, in the setting  of acute on chronic systolic congestive heart failure, likely demand supply ischemia ( I21.A1 ) 3.  Coronary artery disease, history of non-STEMI and multiple coronary stents 03/20/2006, status post CABG x2 with LIMA to LAD and SVG to OM on 07/08/2017, with recent [place] 07/17/2020 which did not reveal evidence for ischemia. 4.  Ischemic cardiomyopathy, with mild reduced left ventricular function, status post ICD  Recommendations  1.  Agree with overall current therapy 2.  Defer full dose anticoagulation 3.  Continue diuresis 4.  Carefully monitor renal status 5.  Defer cardiac catheterization 6.  Continue metoprolol tartrate and losartan  Signed: Isaias Cowman MD,PhD, [facility] 07/21/2020, 1:14 PM

## 2020-07-21 NOTE — ED Notes (Signed)
Ed md aware of elevated troponin

## 2020-07-21 NOTE — H&P (Signed)
History and Physical    Christine Obrien OZD:[id number] DOB: 24-Jan-1941 DOA: 07/21/2020  PCP: [name], MD   Patient coming from: Home  I have personally briefly reviewed patient's old medical records in [place]  Chief Complaint: Shortness of breath  HPI: Christine Obrien is a 80 y.o. female with medical history significant for chronic systolic heart failure, last known LVEF of 35%, depression, COPD, diabetes mellitus complications of stage III chronic kidney disease, GERD, hypertension, hypothyroidism, status post recent left patella fracture repair who presents to the emergency room for evaluation of exertional shortness of breath which she has had for 2 days as well as worsening knee pain. Patient notes that she has shortness of breath with moderate exertion and has to rest after ambulating to the bathroom to be able to catch her breath.  She denies having any lower extremity swelling, no chest pain and denies having any orthopnea or paroxysmal nocturnal dyspnea. She denies having any fever or chills, no cough, no dizziness or lightheadedness, no abdominal pain, no nausea, no vomiting, no urinary frequency, no dysuria, no nocturia, no diaphoresis or palpitations. Labs show sodium 135, potassium 3.3, chloride 102, bicarb 24, glucose 187, BUN 11, creatinine 0.8, calcium 9.0, alkaline phosphatase 47, albumin 3.9, AST 25,, ALT 18, total protein 6.6, BNP 758, troponin 59 >>78, white count 8.0, hemoglobin 11.9, hematocrit 33.2, MCV 96, RDW 13.9, platelet count 162, PT 13.8, INR 1.1 Respiratory viral panel is negative CT angiogram of the chest showed no evidence of pulmonary embolism. Small bilateral pleural effusions with associated bibasilar atelectasis. Focal airspace density over the right suprahilar region which may be due to atelectasis or possibly infection. Nodular biapical pleural thickening right worse than left with continuous second area of nodularity over the posterior right upper  lobe measuring 0.8 x 1.8 cm unchanged from recent neck CT 05/28/2020 and likely due to chronic scarring, although indeterminate. Recommend follow-up chest CT 3 months versus PET-CT for further Evaluation. Aortic atherosclerosis. Stable left adrenal adenoma. Twelve-lead EKG reviewed by me shows sinus tachycardia with a left bundle branch block.   ED Course: Patient is a 80 year old female who presents to the ER for evaluation of exertional shortness of breath and worsening left knee pain.  She is status post recent left patella repair.  Imaging shows small bilateral pleural effusions.  Patient's last known LVEF is about 35% from 11/21.  She received a dose of Lasix in the ER.  Her troponin is slightly elevated and she will be admitted to the hospital for further evaluation.   Review of Systems: As per HPI otherwise all other systems reviewed and negative.    Past Medical History:  Diagnosis Date  . Anxiety   . Asthma   . Atherosclerotic peripheral vascular disease with intermittent claudication ([place])   . Cancer (HCC)    cervical  . Carotid artery occlusion   . CHF (congestive heart failure), NYHA class III ([place])   . CKD (chronic kidney disease), stage III ([place])   . COPD (chronic obstructive pulmonary disease) ([place])   . Coronary artery disease   . Current use of long term anticoagulation   . Depression   . Diabetes mellitus without complication ([place])   . GERD (gastroesophageal reflux disease)   . Gout   . HLD (hyperlipidemia)   . Hx of CABG   . Hypertension   . Hypothyroidism   . Kidney stones   . LBBB (left bundle branch block)   . Myocardial infarction ([place])   .  Subclavian steal syndrome     Past Surgical History:  Procedure Laterality Date  . ABDOMINAL HYSTERECTOMY     partial  . CHOLECYSTECTOMY    . COLONOSCOPY WITH PROPOFOL N/A 01/11/2020   Procedure: COLONOSCOPY WITH PROPOFOL;  Surgeon: Toledo, Benay Pike, MD;  Location: [facility] ENDOSCOPY;  Service: Gastroenterology;   Laterality: N/A;  . CORONARY ANGIOPLASTY    . CORONARY ARTERY BYPASS GRAFT     double  . EYE SURGERY Bilateral 08/2009   cataracts  . HIATAL HERNIA REPAIR  1980s  . LEFT HEART CATH AND CORONARY ANGIOGRAPHY N/A 07/03/2017   Procedure: LEFT HEART CATH AND CORONARY ANGIOGRAPHY;  Surgeon: Corey Skains, MD;  Location: [place] [facility];  Service: Cardiovascular;  Laterality: N/A;  . LOWER EXTREMITY ANGIOGRAPHY Left 01/11/2018   Procedure: LOWER EXTREMITY ANGIOGRAPHY;  Surgeon: Algernon Huxley, MD;  Location: [place] [facility];  Service: Cardiovascular;  Laterality: Left;  . THYROID SURGERY     pt does not remember if removed or partial removal     reports that she has been smoking cigarettes. She has been smoking about 1.00 pack per day. She has never used smokeless tobacco. She reports that she does not drink alcohol and does not use drugs.  Allergies  Allergen Reactions  . Ace Inhibitors     Other reaction(s): Cough  . Atorvastatin     Other reaction(s): Muscle Pain  . Statins     Other reaction(s): Muscle Pain Muscle pain  . Sulfa Antibiotics     Stomach pains    Family History  Problem Relation Age of Onset  . Hypertension Mother   . Hypertension Father   . Diabetes Sister   . Cancer Brother   . Diabetes Brother   . Breast cancer Neg Hx       Prior to Admission medications   Medication Sig Start Date End Date Taking? Authorizing Provider  apixaban (ELIQUIS) 5 MG TABS tablet Take 5 mg by mouth daily. 03/05/20  Yes [provider]  empagliflozin (JARDIANCE) 10 MG TABS tablet Take 10 mg by mouth daily.   Yes [provider]  ezetimibe (ZETIA) 10 MG tablet Take by mouth. 08/20/17 07/21/20 Yes [provider]  ferrous sulfate 325 (65 FE) MG tablet Take 325 mg by mouth.  03/09/16  Yes [provider]  fluticasone (FLONASE) 50 MCG/ACT nasal spray Place 2 sprays into the nose daily.   Yes [provider]  Fluticasone-Salmeterol  (ADVAIR) 250-50 MCG/DOSE AEPB Inhale 1 puff into the lungs daily. 08/13/18  Yes [provider]  glipiZIDE (GLUCOTROL) 5 MG tablet Take by mouth daily before breakfast.   Yes [provider]  levothyroxine (SYNTHROID) 137 MCG tablet Take 137 mcg by mouth daily. 10/29/17  Yes [provider]  loratadine (CLARITIN) 10 MG tablet Take 10 mg by mouth daily. 08/04/18  Yes [provider]  losartan (COZAAR) 25 MG tablet Take by mouth. 12/02/18 07/21/20 Yes [provider]  metFORMIN (GLUCOPHAGE) 500 MG tablet Take 500 mg by mouth 2 (two) times daily with a meal.   Yes [provider]  metoprolol tartrate (LOPRESSOR) 25 MG tablet Take 1 tablet (25 mg total) by mouth 2 (two) times daily. 07/04/17  Yes Fritzi Mandes, MD  Multiple Vitamin (MULTIVITAMIN) capsule Take 1 capsule by mouth daily.   Yes [provider]  omega-3 acid ethyl esters (LOVAZA) 1 g capsule Take 2 g by mouth 2 (two) times daily.   Yes [provider]  omeprazole (PRILOSEC) 20 MG capsule Take 20 mg by mouth 2 (two) times daily before a meal. 01/09/16  Yes [provider]  oxyCODONE (OXY IR/ROXICODONE) 5 MG immediate release tablet Take 1 tablet by mouth every 4 (four) hours as needed for pain or severe pain. 07/13/20  Yes [provider]  oxyCODONE-acetaminophen (PERCOCET) 5-325 MG tablet Take 1 tablet by mouth every 4 (four) hours as needed for severe pain. 07/11/20 07/11/21 Yes Blake Divine, MD  venlafaxine (EFFEXOR) 75 MG tablet Take 75 mg by mouth 2 (two) times daily with a meal.   Yes [provider]  Acetaminophen (TYLENOL PO) Take 500 mg by mouth.     [provider]    Physical Exam: Vitals:   07/21/20 0830 07/21/20 0930 07/21/20 1045 07/21/20 1251  BP: 112/77 117/90 122/82 127/74  Pulse: 83 81 85 80  Resp: 16 (!) 22 (!) 23 (!) 22  Temp:      TempSrc:      SpO2: 92% 96% 95% 91%  Weight:      Height:         Vitals:    07/21/20 0830 07/21/20 0930 07/21/20 1045 07/21/20 1251  BP: 112/77 117/90 122/82 127/74  Pulse: 83 81 85 80  Resp: 16 (!) 22 (!) 23 (!) 22  Temp:      TempSrc:      SpO2: 92% 96% 95% 91%  Weight:      Height:          Constitutional: Alert and oriented x 3 .  Appears uncomfortable HEENT:      Head: Normocephalic and atraumatic.         Eyes: PERLA, EOMI, Conjunctivae are normal. Sclera is non-icteric.       Mouth/Throat: Mucous membranes are moist.       Neck: Supple with no signs of meningismus. Cardiovascular:  Tachycardic. No murmurs, gallops, or rubs. 2+ symmetrical distal pulses are present . No JVD. No LE edema Respiratory: Respiratory effort normal . No wheezes, crackles in both bases bilaterally, or rhonchi.  Gastrointestinal: Soft, non tender, and non distended with positive bowel sounds.  Genitourinary: No CVA tenderness. Musculoskeletal:  Decreased range of motion in left knee.  Left knee immobilizer in place. No cyanosis, or erythema of extremities. Neurologic:  Face is symmetric. Moving all extremities. No gross focal neurologic deficits . Skin: Skin is warm, dry.  No rash or ulcers Psychiatric: Mood and affect are normal    Labs on Admission: I have personally reviewed following labs and imaging studies  CBC: Recent Labs  Lab 07/18/20 2040 07/21/20 0818  WBC 6.5 8.0  NEUTROABS 4.4  --   HGB 12.5 11.1*  HCT 37.3 33.2*  MCV 95.4 96.0  PLT 202 921   Basic Metabolic Panel: Recent Labs  Lab 07/18/20 2040 07/21/20 0818  NA 137 135  K 3.6 3.3*  CL 104 102  CO2 24 24  GLUCOSE 131* 187*  BUN 18 11  CREATININE 0.97 0.88  CALCIUM 9.5 9.0   GFR: Estimated Creatinine Clearance: 47.9 mL/min (by C-G formula based on SCr of 0.88 mg/dL). Liver Function Tests: Recent Labs  Lab 07/21/20 0818  AST 25  ALT 18  ALKPHOS 47  BILITOT 1.0  PROT 6.6  ALBUMIN 3.1*   No results for input(s): LIPASE, AMYLASE in the last 168 hours. No results for input(s):  AMMONIA in the last 168 hours. Coagulation Profile: Recent Labs  Lab 07/18/20 2040  INR 1.1   Cardiac  Enzymes: No results for input(s): CKTOTAL, CKMB, CKMBINDEX, TROPONINI in the last 168 hours. BNP (last 3 results) No results for input(s): PROBNP in the last 8760 hours. HbA1C: No results for input(s): HGBA1C in the last 72 hours. CBG: No results for input(s): GLUCAP in the last 168 hours. Lipid Profile: No results for input(s): CHOL, HDL, LDLCALC, TRIG, CHOLHDL, LDLDIRECT in the last 72 hours. Thyroid Function Tests: No results for input(s): TSH, T4TOTAL, FREET4, T3FREE, THYROIDAB in the last 72 hours. Anemia Panel: No results for input(s): VITAMINB12, FOLATE, FERRITIN, TIBC, IRON, RETICCTPCT in the last 72 hours. Urine analysis:    Component Value Date/Time   COLORURINE Straw 03/31/2013 0011   APPEARANCEUR Clear 03/31/2013 0011   LABSPEC 1.005 03/31/2013 0011   PHURINE 8.0 03/31/2013 0011   GLUCOSEU Negative 03/31/2013 0011   HGBUR Negative 03/31/2013 0011   BILIRUBINUR Negative 03/31/2013 0011   KETONESUR Negative 03/31/2013 0011   PROTEINUR Negative 03/31/2013 0011   NITRITE Negative 03/31/2013 0011   LEUKOCYTESUR Negative 03/31/2013 0011    Radiological Exams on Admission: CT Angio Chest PE W and/or Wo Contrast  Result Date: 07/21/2020 CLINICAL DATA:  Left knee surgery 2 days ago. Increased weakness and shortness of breath this morning. Possible pulmonary embolism. EXAM: CT ANGIOGRAPHY CHEST WITH CONTRAST TECHNIQUE: Multidetector CT imaging of the chest was performed using the standard protocol during bolus administration of intravenous contrast. Multiplanar CT image reconstructions and MIPs were obtained to evaluate the vascular anatomy. CONTRAST:  85mL OMNIPAQUE IOHEXOL 350 MG/ML SOLN COMPARISON:  Neck CT 05/28/2020 and noncontrast abdominal CT 06/12/2012 FINDINGS: Cardiovascular: Mild cardiomegaly. Left-sided pacemaker is present. Sternotomy wires are present. Previous  CABG. Thoracic aorta is normal in caliber. There is calcified plaque over the thoracic aorta. Pulmonary arterial system is well opacified and demonstrates no evidence of pulmonary embolism. Mediastinum/Nodes: No significant mediastinal or hilar adenopathy. Remaining mediastinal structures are unremarkable. Lungs/Pleura: Lungs are adequately inflated with small bilateral pleural effusions and associated bibasilar atelectasis. Subtle peripheral patchy density over the lingula likely atelectasis. There is focal airspace density over the right suprahilar region which may be due to atelectasis or possibly infection. Mild nodular biapical pleural thickening right worse than left with continuous second area of nodularity over the posterior right upper lobe measuring 0.8 x 1.8 cm unchanged from recent neck CT 05/28/2020. Findings likely due to chronic scarring, although indeterminate. Airways are normal. Upper Abdomen: Calcified plaque over the abdominal aorta. Stable left adrenal adenoma. No acute findings. Musculoskeletal: Degenerative change of the spine. Review of the MIP images confirms the above findings. IMPRESSION: 1. No evidence of pulmonary embolism. 2. Small bilateral pleural effusions with associated bibasilar atelectasis. Focal airspace density over the right suprahilar region which may be due to atelectasis or possibly infection. 3. Nodular biapical pleural thickening right worse than left with continuous second area of nodularity over the posterior right upper lobe measuring 0.8 x 1.8 cm unchanged from recent neck CT 05/28/2020 and likely due to chronic scarring, although indeterminate. Recommend follow-up chest CT 3 months versus PET-CT for further evaluation. 4. Aortic atherosclerosis. Stable left adrenal adenoma. Aortic Atherosclerosis (ICD10-I70.0). Electronically Signed   By: Marin Olp M.D.   On: 07/21/2020 11:00   DG Chest Portable 1 View  Result Date: 07/21/2020 CLINICAL DATA:  Dyspnea EXAM:  PORTABLE CHEST 1 VIEW COMPARISON:  07/02/2017 chest radiograph. FINDINGS: Intact sternotomy wires. Two lead left subclavian ICD with lead tips overlying the right atrium and right ventricle. Surgical clips overlie the left mediastinum. Stable cardiomediastinal  silhouette with mild cardiomegaly. No pneumothorax. No pleural effusion. Cephalization of the pulmonary vasculature without overt pulmonary edema. Mild curvilinear bibasilar scarring versus atelectasis. IMPRESSION: 1. Mild cardiomegaly without overt pulmonary edema. 2. Mild curvilinear bibasilar scarring versus atelectasis. Electronically Signed   By: Ilona Sorrel M.D.   On: 07/21/2020 08:40     Assessment/Plan Principal Problem:   Acute systolic CHF (congestive heart failure) (HCC) Active Problems:   Coronary artery disease involving native heart   Anxiety, generalized   Essential hypertension   Hypothyroidism   CKD stage 3 due to type 2 diabetes mellitus (HCC)   Nicotine dependence    Acute on chronic systolic CHF Patient presents for evaluation of shortness of breath with minimal exertion Imaging shows small bilateral pleural effusions and BNP is elevated 2D echocardiogram shows an LVEF of 35% from 11/21 We will start patient on Lasix 40 mg IV daily Continue losartan and metoprolol   Hypertension Blood pressure stable Continue Losartan and metoprolol    Diabetes mellitus with complications of stage III chronic kidney disease Hold oral hypoglycemic agents Maintain consistent carbohydrate diet Glycemic control with sliding scale insulin    Nicotine dependence Smoking cessation was discussed with patient in detail She declines a nicotine transdermal patch at this time    Depression/anxiety Continue Effexor    History of coronary artery disease status post CABG Continue metoprolol, fish oil, Zetia and aspirin    Status post left patella fracture repair Left knee remains immobilized Pain  control    Hypothyroidism Continue Synthroid   History of post operative atrial fibrillation/venous thromboembolic disease Continue scheduled Apixaban   Elevated troponin Most likely secondary to demand ischemia She has no EKG changes We will continue to cycle cardiac enzymes  DVT prophylaxis: Apixaban Code Status: full code Family Communication: Greater than 50% of time was spent discussing patient's condition and plan of care at the bedside.  All questions and concerns have been addressed.  She verbalizes understanding and agrees with the plan.  Disposition Plan: Back to previous home environment Consults called: Cardiology Status: At the time of admission, it appears that the appropriate admission status for this patient is inpatient.   This is judged to be reasonable and necessary in order to provide the required intensity of service to ensure the patient's safety given the presenting symptoms, physical exam findings, and initial radiographic and laboratory data in the context of their comorbid conditions. Patient requires inpatient status due to high intensity of service, high risk for further deterioration and high frequency of surveillance required.    Collier Bullock MD [facility]     07/21/2020, 12:57 PM

## 2020-07-21 NOTE — ED Triage Notes (Signed)
Pt to ER via EMS with reports of left knee surgery 2 days ago and continued pain.  Pt also reports increased weakness and SHOB when attempting to go to bathroom this AM.

## 2020-07-21 NOTE — ED Provider Notes (Signed)
[facility] Emergency Department Provider Note  Time seen: 8:14 AM  I have reviewed the triage vital signs and the nursing notes.   HISTORY  Chief Complaint Weakness and Shortness of Breath   HPI Christine Obrien is a 80 y.o. female with a past medical history of anxiety, asthma, CHF, CKD, COPD, diabetes, gastric reflux, hypertension, presents to the emergency department for shortness of breath and left knee pain.  According to the patient she had a knee operation 2 days ago and has been experiencing significant pain in the left knee.  She also states she has been experiencing intermittent shortness of breath, 2 episodes of shortness of breath last night lasting several minutes each and then an episode this morning.  Denies any chest pain nausea or diaphoresis.  No pleuritic pain.  Does state an occasional cough.   Past Medical History:  Diagnosis Date  . Anxiety   . Asthma   . Atherosclerotic peripheral vascular disease with intermittent claudication ([place])   . Cancer (HCC)    cervical  . Carotid artery occlusion   . CHF (congestive heart failure), NYHA class III ([place])   . CKD (chronic kidney disease), stage III ([place])   . COPD (chronic obstructive pulmonary disease) ([place])   . Coronary artery disease   . Current use of long term anticoagulation   . Depression   . Diabetes mellitus without complication ([place])   . GERD (gastroesophageal reflux disease)   . Gout   . HLD (hyperlipidemia)   . Hx of CABG   . Hypertension   . Hypothyroidism   . Kidney stones   . LBBB (left bundle branch block)   . Myocardial infarction ([place])   . Subclavian steal syndrome     Patient Active Problem List   Diagnosis Date Noted  . ICD (implantable cardioverter-defibrillator) in place 11/02/2019  . Chronic systolic CHF (congestive heart failure), NYHA class 3 ([place]) 02/14/2019  . LBBB (left bundle branch block) 12/02/2018  . 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 Subclavian steal syndrome 01/12/2018  . Atherosclerotic peripheral vascular disease with intermittent claudication ([place]) 12/30/2017  . Pain in both upper extremities 12/30/2017  . Hyperlipidemia 12/30/2017  . Coronary artery disease involving native heart 12/30/2017  . Postoperative atrial fibrillation ([place]) 07/09/2017  . S/P CABG x 2 07/09/2017  . Stenosis of left subclavian artery ([place]) 07/07/2017  . History of non-ST elevation myocardial infarction (NSTEMI) 07/06/2017  . Presence of stent in coronary artery 07/06/2017  . Chest pain 07/03/2017  . NSTEMI (non-ST elevated myocardial infarction) ([place]) 07/03/2017  . Impingement syndrome of right shoulder region 03/06/2017  . Diabetes ([place]) 03/25/2016  . Carotid stenosis 03/25/2016  . Anxiety, generalized 08/24/2015  . Gastroesophageal reflux disease without esophagitis 07/13/2015  . DDD (degenerative disc disease), cervical 04/06/2015  . Cervical radiculitis 04/06/2015  . Type 2 diabetes mellitus without complications ([place]) 73/53/2992  . Carotid bruit 09/15/2011  . Symptoms involving cardiovascular system 09/15/2011  . Essential hypertension 09/11/2011  . Dysthymic disorder 09/11/2011  . Hypothyroidism 09/11/2011  . Major depressive disorder, recurrent episode, moderate ([place]) 09/11/2011  . Dyslipidemia 03/18/2011    Past Surgical History:  Procedure Laterality Date  . ABDOMINAL HYSTERECTOMY     partial  . CHOLECYSTECTOMY    . COLONOSCOPY WITH PROPOFOL N/A 01/11/2020   Procedure: COLONOSCOPY WITH PROPOFOL;  Surgeon: Toledo, Benay Pike, MD;  Location: [facility] ENDOSCOPY;  Service: Gastroenterology;  Laterality: N/A;  . CORONARY ANGIOPLASTY    .  CORONARY ARTERY BYPASS GRAFT     double  . EYE SURGERY Bilateral 08/2009   cataracts  . HIATAL HERNIA REPAIR  1980s  . LEFT HEART CATH AND CORONARY ANGIOGRAPHY N/A 07/03/2017   Procedure: LEFT HEART CATH AND CORONARY ANGIOGRAPHY;  Surgeon: Corey Skains, MD;  Location: [facility];  Service: Cardiovascular;  Laterality: N/A;  . LOWER EXTREMITY ANGIOGRAPHY Left 01/11/2018   Procedure: LOWER EXTREMITY ANGIOGRAPHY;  Surgeon: Algernon Huxley, MD;  Location: [facility];  Service: Cardiovascular;  Laterality: Left;  . THYROID SURGERY     pt does not remember if removed or partial removal    Prior to Admission medications   Medication Sig Start Date End Date Taking? Authorizing Provider  Acetaminophen (TYLENOL PO) Take 500 mg by mouth.     [provider]  apixaban (ELIQUIS) 5 MG TABS tablet Take 5 mg by mouth daily. 03/05/20   [provider]  doxepin (SINEQUAN) 10 MG capsule Take by mouth. Two tablets at night 02/01/18 04/17/20  [provider]  empagliflozin (JARDIANCE) 10 MG TABS tablet Take 10 mg by mouth daily.    [provider]  ezetimibe (ZETIA) 10 MG tablet Take by mouth. 08/20/17 07/11/20  [provider]  ferrous sulfate 325 (65 FE) MG tablet Take 325 mg by mouth.  03/09/16   [provider]  fluticasone (FLONASE) 50 MCG/ACT nasal spray Place 2 sprays into the nose daily.    [provider]  Fluticasone-Salmeterol (ADVAIR) 250-50 MCG/DOSE AEPB Inhale 1 puff into the lungs daily. 08/13/18   [provider]  glipiZIDE (GLUCOTROL) 5 MG tablet Take by mouth daily before breakfast.    [provider]  levothyroxine (SYNTHROID) 137 MCG tablet Take 137 mcg by mouth daily. 10/29/17   [provider]  loratadine (CLARITIN) 10 MG tablet Take 10 mg by mouth daily. 08/04/18   [provider]  losartan (COZAAR) 25 MG tablet Take by mouth. 12/02/18 04/17/20  [provider]  metFORMIN (GLUCOPHAGE) 500 MG tablet Take 500 mg by mouth 2 (two) times daily with a meal.    [provider]  metoprolol tartrate (LOPRESSOR) 25 MG tablet Take 1 tablet (25 mg total) by mouth 2 (two) times daily. 07/04/17   Fritzi Mandes, MD  Multiple Vitamin (MULTIVITAMIN)  capsule Take 1 capsule by mouth daily.    [provider]  omega-3 acid ethyl esters (LOVAZA) 1 g capsule Take 2 g by mouth 2 (two) times daily.    [provider]  omeprazole (PRILOSEC) 20 MG capsule Take 20 mg by mouth 2 (two) times daily before a meal. 01/09/16   [provider]  oxyCODONE-acetaminophen (PERCOCET) 5-325 MG tablet Take 1 tablet by mouth every 4 (four) hours as needed for severe pain. 07/11/20 07/11/21  Blake Divine, MD  venlafaxine (EFFEXOR) 75 MG tablet Take 75 mg by mouth 2 (two) times daily with a meal.    [provider]    Allergies  Allergen Reactions  . Ace Inhibitors     Other reaction(s): Cough  . Atorvastatin     Other reaction(s): Muscle Pain  . Statins     Other reaction(s): Muscle Pain Muscle pain  . Sulfa Antibiotics     Stomach pains    Family History  Problem Relation Age of Onset  . Hypertension Mother   . Hypertension Father   . Diabetes Sister   . Cancer Brother   . Diabetes Brother   .  Breast cancer Neg Hx     Social History Social History   Tobacco Use  . Smoking status: Current Every Day Smoker    Packs/day: 1.00    Types: Cigarettes  . Smokeless tobacco: Never Used  Vaping Use  . Vaping Use: Never used  Substance Use Topics  . Alcohol use: No  . Drug use: No    Review of Systems Constitutional: Negative for fever Cardiovascular: Negative for chest pain. Respiratory: Intermittent shortness of breath since last night. Gastrointestinal: Negative for abdominal pain Musculoskeletal: Left knee pain Skin: Negative for skin complaints  Neurological: Negative for headache All other ROS negative  ____________________________________________   PHYSICAL EXAM:  VITAL SIGNS: ED Triage Vitals  Enc Vitals Group     BP 07/21/20 0811 108/72     Pulse Rate 07/21/20 0811 84     Resp 07/21/20 0811 18     Temp 07/21/20 0811 98.2 F (36.8 C)     Temp Source 07/21/20 0811 Oral     SpO2 --       Weight 07/21/20 0814 144 lb (65.3 kg)     Height 07/21/20 0814 5\' 3"  (1.6 m)     Head Circumference --      Peak Flow --      Pain Score 07/21/20 0813 10     Pain Loc --      Pain Edu? --      Excl. in [place]? --    Constitutional: Alert and oriented. Well appearing and in no distress. Eyes: Normal exam ENT      Head: Normocephalic and atraumatic.      Mouth/Throat: Mucous membranes are moist. Cardiovascular: Normal rate, regular rhythm.  Respiratory: Normal respiratory effort without tachypnea nor retractions. Breath sounds are clear Gastrointestinal: Soft and nontender. No distention. Musculoskeletal: Left knee is dressed with the knee immobilizer in place.  Neurovascular intact distally. Neurologic:  Normal speech and language. No gross focal neurologic deficits  Skin:  Skin is warm, dry and intact.  Psychiatric: Mood and affect are normal.   ____________________________________________    EKG  EKG viewed and interpreted by myself shows what appears to be a left bundle branch block at 84 bpm with a widened QRS, left axis deviation with nonspecific ST changes.  Left bundle branch present on prior EKG 07/02/2017.  ____________________________________________    RADIOLOGY  Chest x-ray shows cardiomegaly but no overt pulmonary edema CTA negative for PE  ____________________________________________   INITIAL IMPRESSION / ASSESSMENT AND PLAN / ED COURSE  Pertinent labs & imaging results that were available during my care of the patient were reviewed by me and considered in my medical decision making (see chart for details).   Patient presents to the emergency department for shortness of breath intermittent since last night along with left knee pain since her surgery 2 days ago.  Care everywhere notes show the patient had an open reduction and internal fixation of the left patella.  Overall patient appears well, satting 92% currently on room air.  We will check labs including  cardiac enzymes, BNP, we will obtain a chest x-ray and EKG and continue to closely monitor.  Differential would include ACS, pneumonia, pneumothorax, CHF, less likely PE given only 2 days of immobilization with no chest pain or pleuritic pain and reassuring vitals.  CTA is negative for PE.  Patient's troponin has increased from 59-78.  Patient's BNP is elevated with a recent EF of 30 to 35%.  Suspect likely CHF exacerbation.  We  will dose IV Lasix.  We will admit to the hospital service for further evaluation.  Christine Obrien was evaluated in Emergency Department on 07/21/2020 for the symptoms described in the history of present illness. She was evaluated in the context of the global COVID-19 pandemic, which necessitated consideration that the patient might be at risk for infection with the SARS-CoV-2 virus that causes COVID-19. Institutional protocols and algorithms that pertain to the evaluation of patients at risk for COVID-19 are in a state of rapid change based on information released by regulatory bodies including the CDC and federal and state organizations. These policies and algorithms were followed during the patient's care in the ED.  ____________________________________________   FINAL CLINICAL IMPRESSION(S) / ED DIAGNOSES  Shortness of breath CHF exacerbation   Harvest Dark, MD 07/21/20 1134

## 2020-07-22 DIAGNOSIS — E1169 Type 2 diabetes mellitus with other specified complication: Secondary | ICD-10-CM

## 2020-07-22 DIAGNOSIS — I5023 Acute on chronic systolic (congestive) heart failure: Secondary | ICD-10-CM

## 2020-07-22 DIAGNOSIS — I10 Essential (primary) hypertension: Secondary | ICD-10-CM

## 2020-07-22 LAB — CBC
HCT: 34.6 % — ABNORMAL LOW (ref 36.0–46.0)
Hemoglobin: 11.6 g/dL — ABNORMAL LOW (ref 12.0–15.0)
MCH: 32.8 pg (ref 26.0–34.0)
MCHC: 33.5 g/dL (ref 30.0–36.0)
MCV: 97.7 fL (ref 80.0–100.0)
Platelets: 186 10*3/uL (ref 150–400)
RBC: 3.54 MIL/uL — ABNORMAL LOW (ref 3.87–5.11)
RDW: 14 % (ref 11.5–15.5)
WBC: 7.4 10*3/uL (ref 4.0–10.5)
nRBC: 0 % (ref 0.0–0.2)

## 2020-07-22 LAB — BASIC METABOLIC PANEL
Anion gap: 8 (ref 5–15)
BUN: 15 mg/dL (ref 8–23)
CO2: 29 mmol/L (ref 22–32)
Calcium: 9.3 mg/dL (ref 8.9–10.3)
Chloride: 102 mmol/L (ref 98–111)
Creatinine, Ser: 1.01 mg/dL — ABNORMAL HIGH (ref 0.44–1.00)
GFR, Estimated: 57 mL/min — ABNORMAL LOW (ref >60)
Glucose, Bld: 143 mg/dL — ABNORMAL HIGH (ref 70–99)
Potassium: 3.6 mmol/L (ref 3.5–5.1)
Sodium: 139 mmol/L (ref 135–145)

## 2020-07-22 NOTE — Progress Notes (Signed)
[facility] Cardiology  SUBJECTIVE: Patient laying in bed, denies chest pain or shortness of breath   Vitals:   07/21/20 2150 07/22/20 0500 07/22/20 0535 07/22/20 0755  BP: (!) 139/58  95/67 (!) 89/56  Pulse: 65  69 66  Resp:   14 16  Temp:   97.9 F (36.6 C) 97.9 F (36.6 C)  TempSrc:   Oral Oral  SpO2:   96% 96%  Weight:  67.1 kg    Height:         Intake/Output Summary (Last 24 hours) at 07/22/2020 1018 Last data filed at 07/22/2020 0900 Gross per 24 hour  Intake 240 ml  Output 500 ml  Net -260 ml      PHYSICAL EXAM  General: Well developed, well nourished, in no acute distress HEENT:  Normocephalic and atramatic Neck:  No JVD.  Lungs: Clear bilaterally to auscultation and percussion. Heart: HRRR . Normal S1 and S2 without gallops or murmurs.  Abdomen: Bowel sounds are positive, abdomen soft and non-tender  Msk:  Back normal, normal gait. Normal strength and tone for age. Extremities: No clubbing, cyanosis or edema.   Neuro: Alert and oriented X 3. Psych:  Good affect, responds appropriately   LABS: Basic Metabolic Panel: Recent Labs    07/21/20 0818 07/22/20 0355  NA 135 139  K 3.3* 3.6  CL 102 102  CO2 24 29  GLUCOSE 187* 143*  BUN 11 15  CREATININE 0.88 1.01*  CALCIUM 9.0 9.3   Liver Function Tests: Recent Labs    07/21/20 0818  AST 25  ALT 18  ALKPHOS 47  BILITOT 1.0  PROT 6.6  ALBUMIN 3.1*   No results for input(s): LIPASE, AMYLASE in the last 72 hours. CBC: Recent Labs    07/21/20 0818 07/22/20 0753  WBC 8.0 7.4  HGB 11.1* 11.6*  HCT 33.2* 34.6*  MCV 96.0 97.7  PLT 162 186   Cardiac Enzymes: No results for input(s): CKTOTAL, CKMB, CKMBINDEX, TROPONINI in the last 72 hours. BNP: Invalid input(s): POCBNP D-Dimer: No results for input(s): DDIMER in the last 72 hours. Hemoglobin A1C: No results for input(s): HGBA1C in the last 72 hours. Fasting Lipid Panel: No results for input(s): CHOL, HDL, LDLCALC, TRIG, CHOLHDL, LDLDIRECT in the  last 72 hours. Thyroid Function Tests: No results for input(s): TSH, T4TOTAL, T3FREE, THYROIDAB in the last 72 hours.  Invalid input(s): FREET3 Anemia Panel: No results for input(s): VITAMINB12, FOLATE, FERRITIN, TIBC, IRON, RETICCTPCT in the last 72 hours.  CT Angio Chest PE W and/or Wo Contrast  Result Date: 07/21/2020 CLINICAL DATA:  Left knee surgery 2 days ago. Increased weakness and shortness of breath this morning. Possible pulmonary embolism. EXAM: CT ANGIOGRAPHY CHEST WITH CONTRAST TECHNIQUE: Multidetector CT imaging of the chest was performed using the standard protocol during bolus administration of intravenous contrast. Multiplanar CT image reconstructions and MIPs were obtained to evaluate the vascular anatomy. CONTRAST:  42mL OMNIPAQUE IOHEXOL 350 MG/ML SOLN COMPARISON:  Neck CT 05/28/2020 and noncontrast abdominal CT 06/12/2012 FINDINGS: Cardiovascular: Mild cardiomegaly. Left-sided pacemaker is present. Sternotomy wires are present. Previous CABG. Thoracic aorta is normal in caliber. There is calcified plaque over the thoracic aorta. Pulmonary arterial system is well opacified and demonstrates no evidence of pulmonary embolism. Mediastinum/Nodes: No significant mediastinal or hilar adenopathy. Remaining mediastinal structures are unremarkable. Lungs/Pleura: Lungs are adequately inflated with small bilateral pleural effusions and associated bibasilar atelectasis. Subtle peripheral patchy density over the lingula likely atelectasis. There is focal airspace density over the right  suprahilar region which may be due to atelectasis or possibly infection. Mild nodular biapical pleural thickening right worse than left with continuous second area of nodularity over the posterior right upper lobe measuring 0.8 x 1.8 cm unchanged from recent neck CT 05/28/2020. Findings likely due to chronic scarring, although indeterminate. Airways are normal. Upper Abdomen: Calcified plaque over the abdominal aorta.  Stable left adrenal adenoma. No acute findings. Musculoskeletal: Degenerative change of the spine. Review of the MIP images confirms the above findings. IMPRESSION: 1. No evidence of pulmonary embolism. 2. Small bilateral pleural effusions with associated bibasilar atelectasis. Focal airspace density over the right suprahilar region which may be due to atelectasis or possibly infection. 3. Nodular biapical pleural thickening right worse than left with continuous second area of nodularity over the posterior right upper lobe measuring 0.8 x 1.8 cm unchanged from recent neck CT 05/28/2020 and likely due to chronic scarring, although indeterminate. Recommend follow-up chest CT 3 months versus PET-CT for further evaluation. 4. Aortic atherosclerosis. Stable left adrenal adenoma. Aortic Atherosclerosis (ICD10-I70.0). Electronically Signed   By: Marin Olp M.D.   On: 07/21/2020 11:00   DG Chest Portable 1 View  Result Date: 07/21/2020 CLINICAL DATA:  Dyspnea EXAM: PORTABLE CHEST 1 VIEW COMPARISON:  07/02/2017 chest radiograph. FINDINGS: Intact sternotomy wires. Two lead left subclavian ICD with lead tips overlying the right atrium and right ventricle. Surgical clips overlie the left mediastinum. Stable cardiomediastinal silhouette with mild cardiomegaly. No pneumothorax. No pleural effusion. Cephalization of the pulmonary vasculature without overt pulmonary edema. Mild curvilinear bibasilar scarring versus atelectasis. IMPRESSION: 1. Mild cardiomegaly without overt pulmonary edema. 2. Mild curvilinear bibasilar scarring versus atelectasis. Electronically Signed   By: Ilona Sorrel M.D.   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 Acute on chronic systolic congestive heart failure, clinically improved after initial diuresis, known moderate reduced left ventricular function, with LVEF of 35% by 2D echocardiogram 02/27/2020 2.  Borderline elevated high-sensitivity troponin (59, 78), in the absence of chest pain, in the setting of acute on chronic systolic congestive heart failure, likely demand supply ischemia ( I21.A1 ) 3.  Coronary artery disease, history of non-STEMI and multiple coronary stents 03/20/2006, status post CABG x2 with LIMA to LAD and SVG to OM on 07/08/2017, with recent [place] 07/17/2020 which did not reveal evidence for ischemia. 4.  Ischemic cardiomyopathy, with mild reduced left ventricular function, status post ICD  Recommendations  1.  Agree with overall current therapy 2.  Defer full dose anticoagulation 3.  Continue diuresis 4.  Carefully monitor renal status 5.  Defer cardiac catheterization 6.  Continue metoprolol tartrate and losartan   Isaias Cowman, MD, PhD, [facility] 07/22/2020 10:18 AM

## 2020-07-22 NOTE — Progress Notes (Signed)
PROGRESS NOTE    Christine Obrien  XTG:[id number] DOB: [birth year] DOA: 07/21/2020 PCP: [name], MD   Assessment & Plan:   Principal Problem:   Acute systolic CHF (congestive heart failure) ([place]) Active Problems:   Coronary artery disease involving native heart   Anxiety, generalized   Essential hypertension   Hypothyroidism   CKD stage 3 due to type 2 diabetes mellitus ([place])   Nicotine dependence   Acute on chronic systolic CHF: echo shows EF of 35% in 11/21. Continue on IV lasix. Monitor I/Os and daily weights. Continue on losartan, metoprolol. Cardio is following and recs apprec  HTN: continue on metoprolol, losartan  DM2: likely well controlled. Continue on SSI w/ accuchecks   Nicotine dependence: smoking cessation counseling  Depression: severity unknown. Continue on home dose of effexor   Hx of CAD: s/p CABG. Continue on metoprolol, zetia, aspirin   Left patella fracture: s/p repair. Left knee remains immobilized.  Hypothyroidism: continue on home dose of levothyroxine   PAF & DVT: continue on eliquis   Elevated troponin: likely secondary to demand ischemia. Cardio is following      DVT prophylaxis: eliquis  Code Status: full  Family Communication: discussed pt's care w/ pt's daughter, Angela Nevin, and answered her questions  Disposition Plan: likely d/c back home  Level of care: Progressive Cardiac   Status is: Inpatient  Remains inpatient appropriate because:Unsafe d/c plan, IV treatments appropriate due to intensity of illness or inability to take PO and Inpatient level of care appropriate due to severity of illness   Dispo: The patient is from: Home              Anticipated d/c is to: Home              Patient currently is not medically stable to d/c.   Difficult to place patient No    Consultants:   Cardio    Procedures:  Antimicrobials:   Subjective: Pt c/o fatigue  Objective: Vitals:   07/21/20 1957 07/21/20 2150 07/22/20 0500  07/22/20 0535  BP: 95/63 (!) 139/58  95/67  Pulse: 64 65  69  Resp: 16   14  Temp: (!) 97.5 F (36.4 C)   97.9 F (36.6 C)  TempSrc: Oral   Oral  SpO2: 98%   96%  Weight:   67.1 kg   Height:        Intake/Output Summary (Last 24 hours) at 07/22/2020 0741 Last data filed at 07/22/2020 0500 Gross per 24 hour  Intake --  Output 500 ml  Net -500 ml   Filed Weights   07/21/20 0814 07/22/20 0500  Weight: 65.3 kg 67.1 kg    Examination:  General exam: Appears calm and comfortable  Respiratory system: diminished breath sounds b/l. Cardiovascular system: S1 & S2 +. No rubs, gallops or clicks. Gastrointestinal system: Abdomen is nondistended, soft and nontender. Normal bowel sounds heard. Central nervous system: Alert and oriented. Moves all extremities  Psychiatry: Judgement and insight appear normal. Mood & affect appropriate.     Data Reviewed: I have personally reviewed following labs and imaging studies  CBC: Recent Labs  Lab 07/18/20 2040 07/21/20 0818  WBC 6.5 8.0  NEUTROABS 4.4  --   HGB 12.5 11.1*  HCT 37.3 33.2*  MCV 95.4 96.0  PLT 202 270   Basic Metabolic Panel: Recent Labs  Lab 07/18/20 2040 07/21/20 0818 07/22/20 0355  NA 137 135 139  K 3.6 3.3* 3.6  CL 104 102 102  CO2 24 24 29   GLUCOSE 131* 187* 143*  BUN 18 11 15   CREATININE 0.97 0.88 1.01*  CALCIUM 9.5 9.0 9.3   GFR: Estimated Creatinine Clearance: 42.2 mL/min (A) (by C-G formula based on SCr of 1.01 mg/dL (H)). Liver Function Tests: Recent Labs  Lab 07/21/20 0818  AST 25  ALT 18  ALKPHOS 47  BILITOT 1.0  PROT 6.6  ALBUMIN 3.1*   No results for input(s): LIPASE, AMYLASE in the last 168 hours. No results for input(s): AMMONIA in the last 168 hours. Coagulation Profile: Recent Labs  Lab 07/18/20 2040  INR 1.1   Cardiac Enzymes: No results for input(s): CKTOTAL, CKMB, CKMBINDEX, TROPONINI in the last 168 hours. BNP (last 3 results) No results for input(s): PROBNP in the last  8760 hours. HbA1C: No results for input(s): HGBA1C in the last 72 hours. CBG: No results for input(s): GLUCAP in the last 168 hours. Lipid Profile: No results for input(s): CHOL, HDL, LDLCALC, TRIG, CHOLHDL, LDLDIRECT in the last 72 hours. Thyroid Function Tests: No results for input(s): TSH, T4TOTAL, FREET4, T3FREE, THYROIDAB in the last 72 hours. Anemia Panel: No results for input(s): VITAMINB12, FOLATE, FERRITIN, TIBC, IRON, RETICCTPCT in the last 72 hours. Sepsis Labs: No results for input(s): PROCALCITON, LATICACIDVEN in the last 168 hours.  Recent Results (from the past 240 hour(s))  Resp Panel by RT-PCR (Flu A&B, Covid) Nasopharyngeal Swab     Status: None   Collection Time: 07/21/20  8:18 AM   Specimen: Nasopharyngeal Swab; Nasopharyngeal(NP) swabs in vial transport medium  Result Value Ref Range Status   SARS Coronavirus 2 by RT PCR NEGATIVE NEGATIVE Final    Comment: (NOTE) SARS-CoV-2 target nucleic acids are NOT DETECTED.  The SARS-CoV-2 RNA is generally detectable in upper respiratory specimens during the acute phase of infection. The lowest concentration of SARS-CoV-2 viral copies this assay can detect is 138 copies/mL. A negative result does not preclude SARS-Cov-2 infection and should not be used as the sole basis for treatment or other patient management decisions. A negative result may occur with  improper specimen collection/handling, submission of specimen other than nasopharyngeal swab, presence of viral mutation(s) within the areas targeted by this assay, and inadequate number of viral copies(<138 copies/mL). A negative result must be combined with clinical observations, patient history, and epidemiological information. The expected result is Negative.  Fact Sheet for Patients:  EntrepreneurPulse.com.au  Fact Sheet for Healthcare Providers:  IncredibleEmployment.be  This test is no t yet approved or cleared by the  [place] FDA and  has been authorized for detection and/or diagnosis of SARS-CoV-2 by FDA under an Emergency Use Authorization (EUA). This EUA will remain  in effect (meaning this test can be used) for the duration of the COVID-19 declaration under Section 564(b)(1) of the Act, 21 U.S.C.section 360bbb-3(b)(1), unless the authorization is terminated  or revoked sooner.       Influenza A by PCR NEGATIVE NEGATIVE Final   Influenza B by PCR NEGATIVE NEGATIVE Final    Comment: (NOTE) The Xpert Xpress SARS-CoV-2/FLU/RSV plus assay is intended as an aid in the diagnosis of influenza from Nasopharyngeal swab specimens and should not be used as a sole basis for treatment. Nasal washings and aspirates are unacceptable for Xpert Xpress SARS-CoV-2/FLU/RSV testing.  Fact Sheet for Patients: EntrepreneurPulse.com.au  Fact Sheet for Healthcare Providers: IncredibleEmployment.be  This test is not yet approved or cleared by the [place] FDA and has been authorized for detection and/or diagnosis of SARS-CoV-2 by FDA under  an Emergency Use Authorization (EUA). This EUA will remain in effect (meaning this test can be used) for the duration of the COVID-19 declaration under Section 564(b)(1) of the Act, 21 U.S.C. section 360bbb-3(b)(1), unless the authorization is terminated or revoked.  Performed at [facility], [address], [place], [place] [address]          Radiology Studies: CT Angio Chest PE W and/or Wo Contrast  Result Date: 07/21/2020 CLINICAL DATA:  Left knee surgery 2 days ago. Increased weakness and shortness of breath this morning. Possible pulmonary embolism. EXAM: CT ANGIOGRAPHY CHEST WITH CONTRAST TECHNIQUE: Multidetector CT imaging of the chest was performed using the standard protocol during bolus administration of intravenous contrast. Multiplanar CT image reconstructions and MIPs were obtained to evaluate the  vascular anatomy. CONTRAST:  42mL OMNIPAQUE IOHEXOL 350 MG/ML SOLN COMPARISON:  Neck CT 05/28/2020 and noncontrast abdominal CT 06/12/2012 FINDINGS: Cardiovascular: Mild cardiomegaly. Left-sided pacemaker is present. Sternotomy wires are present. Previous CABG. Thoracic aorta is normal in caliber. There is calcified plaque over the thoracic aorta. Pulmonary arterial system is well opacified and demonstrates no evidence of pulmonary embolism. Mediastinum/Nodes: No significant mediastinal or hilar adenopathy. Remaining mediastinal structures are unremarkable. Lungs/Pleura: Lungs are adequately inflated with small bilateral pleural effusions and associated bibasilar atelectasis. Subtle peripheral patchy density over the lingula likely atelectasis. There is focal airspace density over the right suprahilar region which may be due to atelectasis or possibly infection. Mild nodular biapical pleural thickening right worse than left with continuous second area of nodularity over the posterior right upper lobe measuring 0.8 x 1.8 cm unchanged from recent neck CT 05/28/2020. Findings likely due to chronic scarring, although indeterminate. Airways are normal. Upper Abdomen: Calcified plaque over the abdominal aorta. Stable left adrenal adenoma. No acute findings. Musculoskeletal: Degenerative change of the spine. Review of the MIP images confirms the above findings. IMPRESSION: 1. No evidence of pulmonary embolism. 2. Small bilateral pleural effusions with associated bibasilar atelectasis. Focal airspace density over the right suprahilar region which may be due to atelectasis or possibly infection. 3. Nodular biapical pleural thickening right worse than left with continuous second area of nodularity over the posterior right upper lobe measuring 0.8 x 1.8 cm unchanged from recent neck CT 05/28/2020 and likely due to chronic scarring, although indeterminate. Recommend follow-up chest CT 3 months versus PET-CT for further  evaluation. 4. Aortic atherosclerosis. Stable left adrenal adenoma. Aortic Atherosclerosis (ICD10-I70.0). Electronically Signed   By: Marin Olp M.D.   On: 07/21/2020 11:00   DG Chest Portable 1 View  Result Date: 07/21/2020 CLINICAL DATA:  Dyspnea EXAM: PORTABLE CHEST 1 VIEW COMPARISON:  07/02/2017 chest radiograph. FINDINGS: Intact sternotomy wires. Two lead left subclavian ICD with lead tips overlying the right atrium and right ventricle. Surgical clips overlie the left mediastinum. Stable cardiomediastinal silhouette with mild cardiomegaly. No pneumothorax. No pleural effusion. Cephalization of the pulmonary vasculature without overt pulmonary edema. Mild curvilinear bibasilar scarring versus atelectasis. IMPRESSION: 1. Mild cardiomegaly without overt pulmonary edema. 2. Mild curvilinear bibasilar scarring versus atelectasis. Electronically Signed   By: Ilona Sorrel M.D.   On: 07/21/2020 08:40        Scheduled Meds: . apixaban  5 mg Oral BID  . ezetimibe  10 mg Oral Daily  . ferrous sulfate  325 mg Oral BID WC  . fluticasone  2 spray Each Nare Daily  . furosemide  40 mg Intravenous Daily  . levothyroxine  137 mcg Oral Q0600  . loratadine  10  mg Oral Daily  . losartan  25 mg Oral Daily  . metoprolol tartrate  25 mg Oral BID  . mometasone-formoterol  2 puff Inhalation BID  . multivitamin with minerals  1 tablet Oral Daily  . omega-3 acid ethyl esters  2 g Oral BID  . pantoprazole  40 mg Oral Daily  . sodium chloride flush  3 mL Intravenous Q12H  . venlafaxine  75 mg Oral BID WC   Continuous Infusions: . sodium chloride       LOS: 1 day    Time spent: 33 mins     Wyvonnia Dusky, MD [facility] Pager [phone]  If 7PM-7AM, please contact night-coverage 07/22/2020, 7:41 AM

## 2020-07-22 NOTE — Progress Notes (Signed)
PT Cancellation Note  Patient Details Name: Christine Obrien MRN: [id number] DOB: [birth year]   Cancelled Treatment:    Reason Eval/Treat Not Completed: Patient not medically ready PT orders received, chart reviewed. Pt noted to have low BP, spoke with nurse who recommends holding PT evaluation at this time. Will f/u as able & as pt is medically stable.  Lavone Nian, PT, DPT 07/22/20, 1:45 PM    Waunita Schooner 07/22/2020, 1:44 PM

## 2020-07-23 DIAGNOSIS — E876 Hypokalemia: Secondary | ICD-10-CM

## 2020-07-23 DIAGNOSIS — I10 Essential (primary) hypertension: Secondary | ICD-10-CM | POA: Diagnosis not present

## 2020-07-23 DIAGNOSIS — I5023 Acute on chronic systolic (congestive) heart failure: Secondary | ICD-10-CM | POA: Diagnosis not present

## 2020-07-23 LAB — BASIC METABOLIC PANEL
Anion gap: 8 (ref 5–15)
BUN: 21 mg/dL (ref 8–23)
CO2: 28 mmol/L (ref 22–32)
Calcium: 9.4 mg/dL (ref 8.9–10.3)
Chloride: 102 mmol/L (ref 98–111)
Creatinine, Ser: 0.96 mg/dL (ref 0.44–1.00)
GFR, Estimated: >60 mL/min (ref >60)
Glucose, Bld: 146 mg/dL — ABNORMAL HIGH (ref 70–99)
Potassium: 3.4 mmol/L — ABNORMAL LOW (ref 3.5–5.1)
Sodium: 138 mmol/L (ref 135–145)

## 2020-07-23 LAB — CBC
HCT: 32.3 % — ABNORMAL LOW (ref 36.0–46.0)
Hemoglobin: 10.4 g/dL — ABNORMAL LOW (ref 12.0–15.0)
MCH: 31.8 pg (ref 26.0–34.0)
MCHC: 32.2 g/dL (ref 30.0–36.0)
MCV: 98.8 fL (ref 80.0–100.0)
Platelets: 203 10*3/uL (ref 150–400)
RBC: 3.27 MIL/uL — ABNORMAL LOW (ref 3.87–5.11)
RDW: 13.8 % (ref 11.5–15.5)
WBC: 6.8 10*3/uL (ref 4.0–10.5)
nRBC: 0 % (ref 0.0–0.2)

## 2020-07-23 MED ORDER — ACETAMINOPHEN 500 MG PO TABS
500.0000 mg | ORAL_TABLET | Freq: Four times a day (QID) | ORAL | Status: DC | PRN
  Administered 2020-07-23: 500 mg via ORAL
  Filled 2020-07-23: qty 1

## 2020-07-23 MED ORDER — DILTIAZEM HCL 25 MG/5ML IV SOLN: 10.0000 mg | Freq: Once | INTRAVENOUS | Status: DC

## 2020-07-23 MED ORDER — POTASSIUM CHLORIDE CRYS ER 20 MEQ PO TBCR
20.0000 meq | EXTENDED_RELEASE_TABLET | Freq: Once | ORAL | Status: AC
  Administered 2020-07-23: 20 meq via ORAL
  Filled 2020-07-23: qty 1

## 2020-07-23 NOTE — Progress Notes (Signed)
[facility] Cardiology  SUBJECTIVE: Patient laying in bed, eating breakfast, denies chest pain or shortness of breath   Vitals:   07/22/20 1725 07/22/20 2133 07/22/20 2321 07/23/20 0455  BP: 116/70 93/62 (!) 136/48 105/63  Pulse: 74 69 61 62  Resp: 16 16  18   Temp: 98.2 F (36.8 C) 98.1 F (36.7 C)  98.6 F (37 C)  TempSrc: Oral Oral    SpO2: 95% 97%  96%  Weight:    67.5 kg  Height:         Intake/Output Summary (Last 24 hours) at 07/23/2020 0865 Last data filed at 07/23/2020 0520 Gross per 24 hour  Intake 720 ml  Output 1200 ml  Net -480 ml      PHYSICAL EXAM  General: Well developed, well nourished, in no acute distress HEENT:  Normocephalic and atramatic Neck:  No JVD.  Lungs: Clear bilaterally to auscultation and percussion. Heart: HRRR . Normal S1 and S2 without gallops or murmurs.  Abdomen: Bowel sounds are positive, abdomen soft and non-tender  Msk:  Back normal, normal gait. Normal strength and tone for age. Extremities: No clubbing, cyanosis or edema.   Neuro: Alert and oriented X 3. Psych:  Good affect, responds appropriately   LABS: Basic Metabolic Panel: Recent Labs    07/22/20 0355 07/23/20 0347  NA 139 138  K 3.6 3.4*  CL 102 102  CO2 29 28  GLUCOSE 143* 146*  BUN 15 21  CREATININE 1.01* 0.96  CALCIUM 9.3 9.4   Liver Function Tests: Recent Labs    07/21/20 0818  AST 25  ALT 18  ALKPHOS 47  BILITOT 1.0  PROT 6.6  ALBUMIN 3.1*   No results for input(s): LIPASE, AMYLASE in the last 72 hours. CBC: Recent Labs    07/22/20 0753 07/23/20 0347  WBC 7.4 6.8  HGB 11.6* 10.4*  HCT 34.6* 32.3*  MCV 97.7 98.8  PLT 186 203   Cardiac Enzymes: No results for input(s): CKTOTAL, CKMB, CKMBINDEX, TROPONINI in the last 72 hours. BNP: Invalid input(s): POCBNP D-Dimer: No results for input(s): DDIMER in the last 72 hours. Hemoglobin A1C: No results for input(s): HGBA1C in the last 72 hours. Fasting Lipid Panel: No results for input(s): CHOL,  HDL, LDLCALC, TRIG, CHOLHDL, LDLDIRECT in the last 72 hours. Thyroid Function Tests: No results for input(s): TSH, T4TOTAL, T3FREE, THYROIDAB in the last 72 hours.  Invalid input(s): FREET3 Anemia Panel: No results for input(s): VITAMINB12, FOLATE, FERRITIN, TIBC, IRON, RETICCTPCT in the last 72 hours.  CT Angio Chest PE W and/or Wo Contrast  Result Date: 07/21/2020 CLINICAL DATA:  Left knee surgery 2 days ago. Increased weakness and shortness of breath this morning. Possible pulmonary embolism. EXAM: CT ANGIOGRAPHY CHEST WITH CONTRAST TECHNIQUE: Multidetector CT imaging of the chest was performed using the standard protocol during bolus administration of intravenous contrast. Multiplanar CT image reconstructions and MIPs were obtained to evaluate the vascular anatomy. CONTRAST:  27mL OMNIPAQUE IOHEXOL 350 MG/ML SOLN COMPARISON:  Neck CT 05/28/2020 and noncontrast abdominal CT 06/12/2012 FINDINGS: Cardiovascular: Mild cardiomegaly. Left-sided pacemaker is present. Sternotomy wires are present. Previous CABG. Thoracic aorta is normal in caliber. There is calcified plaque over the thoracic aorta. Pulmonary arterial system is well opacified and demonstrates no evidence of pulmonary embolism. Mediastinum/Nodes: No significant mediastinal or hilar adenopathy. Remaining mediastinal structures are unremarkable. Lungs/Pleura: Lungs are adequately inflated with small bilateral pleural effusions and associated bibasilar atelectasis. Subtle peripheral patchy density over the lingula likely atelectasis. There is focal airspace  density over the right suprahilar region which may be due to atelectasis or possibly infection. Mild nodular biapical pleural thickening right worse than left with continuous second area of nodularity over the posterior right upper lobe measuring 0.8 x 1.8 cm unchanged from recent neck CT 05/28/2020. Findings likely due to chronic scarring, although indeterminate. Airways are normal. Upper  Abdomen: Calcified plaque over the abdominal aorta. Stable left adrenal adenoma. No acute findings. Musculoskeletal: Degenerative change of the spine. Review of the MIP images confirms the above findings. IMPRESSION: 1. No evidence of pulmonary embolism. 2. Small bilateral pleural effusions with associated bibasilar atelectasis. Focal airspace density over the right suprahilar region which may be due to atelectasis or possibly infection. 3. Nodular biapical pleural thickening right worse than left with continuous second area of nodularity over the posterior right upper lobe measuring 0.8 x 1.8 cm unchanged from recent neck CT 05/28/2020 and likely due to chronic scarring, although indeterminate. Recommend follow-up chest CT 3 months versus PET-CT for further evaluation. 4. Aortic atherosclerosis. Stable left adrenal adenoma. Aortic Atherosclerosis (ICD10-I70.0). Electronically Signed   By: Marin Olp M.D.   On: 07/21/2020 11:00     Echo   TELEMETRY: Paced rhythm 66 bpm:  ASSESSMENT AND PLAN:  Principal Problem:   Acute systolic CHF (congestive heart failure) ([facility]) Active Problems:   Coronary artery disease involving native heart   Anxiety, generalized   Essential hypertension   Hypothyroidism   CKD stage 3 due to type 2 diabetes mellitus ([facility])   Nicotine dependence    1. Acute on chronic systolic congestive heart failure, clinically improved after initial diuresis, known moderate reduced left ventricular function, with LVEF of 35% by 2D echocardiogram 02/27/2020, 24-hour net output 260 cc 2.Borderline elevated high-sensitivity troponin(59, 78), in the absence of chest pain, in the setting of acute on chronic systolic congestive heart failure, likely demand supply ischemia( I21.A1 ) 3.Coronary artery disease, history of non-STEMI and multiple coronary stents 03/20/2006, status post CABG x2 with LIMA to LAD and SVG to OM on 07/08/2017, with recent [place] 07/17/2020 which did not  reveal evidence for ischemia. 4.Ischemic cardiomyopathy, with mild reduced left ventricular function, status post ICD  Recommendations  1.Agree with overall current therapy 2.Defer full dose anticoagulation 3.Continue diuresis 4.Carefully monitor renal status 5.Defer cardiac catheterization 6.Continue losartan 7.  Continue metoprolol tartrate   Isaias Cowman, MD, PhD, [facility] 07/23/2020 8:29 AM

## 2020-07-23 NOTE — Evaluation (Signed)
Occupational Therapy Evaluation Patient Details Name: Christine Obrien MRN: [id number] DOB: 1941-03-23 Today's Date: 07/23/2020    History of Present Illness Christine Obrien is a 80 y.o. female with medical history significant for chronic systolic heart failure, last known LVEF of 35%, depression, COPD, diabetes mellitus complications of stage III chronic kidney disease, GERD, hypertension, hypothyroidism, status post recent left patella fracture repair who presents to the emergency room for evaluation of exertional shortness of breath which she has had for 2 days as well as worsening knee pain.   Clinical Impression   Pt was seen for OT evaluation this date. Prior to hospital admission and recent L patellar fracture, pt was independent with ADL, using rollator for mobility. Pt lives with his daughter who is able to provide 24/7 sup/assist per pt report. Currently pt demonstrates impairments as described below (See OT problem list) which functionally limit her ability to perform ADL/self-care tasks. Pt currently requires MIN A for LB ADL tasks, CGA for ADL transfers with 2WW and KI donned. Pt educated in [place] for LB ADL tasks, falls prevention, home/routines modifications. Also instructed in PLB and activity pacing to support energy conservation and minimize falls risk. Pt verbalized understanding. Pt would benefit from skilled OT services to address noted impairments and functional limitations (see below for any additional details) in order to maximize safety and independence while minimizing falls risk and caregiver burden. Upon hospital discharge, recommend HHOT to maximize pt safety and return to functional independence during meaningful occupations of daily life.     Follow Up Recommendations  Home health OT;Other (comment) (supervision for bathing, LB dressing, ADL mobility)    Equipment Recommendations  None recommended by OT    Recommendations for Other Services       Precautions / Restrictions  Precautions Precautions: Fall Required Braces or Orthoses: Knee Immobilizer - Left Restrictions Weight Bearing Restrictions: No      Mobility Bed Mobility Overal bed mobility: Needs Assistance Bed Mobility: Sit to Supine       Sit to supine: Modified independent (Device/Increase time)   General bed mobility comments: able to hook R foot behind L ankle to swing BLE into bed    Transfers Overall transfer level: Needs assistance Equipment used: Rolling walker (2 wheeled) Transfers: Sit to/from Stand Sit to Stand: Min guard              Balance Overall balance assessment: Needs assistance Sitting-balance support: No upper extremity supported;Feet supported Sitting balance-Leahy Scale: Good     Standing balance support: Bilateral upper extremity supported;During functional activity Standing balance-Leahy Scale: Fair Standing balance comment: reliant on B UE support due to pain in left LE                           ADL either performed or assessed with clinical judgement   ADL Overall ADL's : Needs assistance/impaired                                       General ADL Comments: Pt requires MIN A for LB ADL tasks, CGA for ADL transfers     Vision Baseline Vision/History: Wears glasses Wears Glasses: Reading only Patient Visual Report: No change from baseline       Perception     Praxis      Pertinent Vitals/Pain Pain Assessment: 0-10 Pain Score: 7  Pain  Location: 7/10 L knee, but back down 4/10 after ADL mobility Pain Descriptors / Indicators: Sore;Grimacing;Guarding Pain Intervention(s): Limited activity within patient's tolerance;Monitored during session;Repositioned     Hand Dominance Right   Extremity/Trunk Assessment Upper Extremity Assessment Upper Extremity Assessment: Overall WFL for tasks assessed   Lower Extremity Assessment Lower Extremity Assessment: Overall WFL for tasks assessed;LLE deficits/detail LLE  Deficits / Details: L KI LLE: Unable to fully assess due to immobilization       Communication Communication Communication: HOH   Cognition Arousal/Alertness: Awake/alert Behavior During Therapy: WFL for tasks assessed/performed Overall Cognitive Status: Within Functional Limits for tasks assessed                                     General Comments       Exercises Other Exercises Other Exercises: Pt educated in AE for LB ADL tasks, falls prevention, home/routines modifications Other Exercises: Pt instructed in PLB and activity pacing to support energy conservation and minimize falls risk   Shoulder Instructions      Home Living Family/patient expects to be discharged to:: Private residence Living Arrangements: Children Available Help at Discharge: Family;Available PRN/intermittently Type of Home: House Home Access: Stairs to enter Entrance Stairs-Number of Steps: 1   Home Layout: One level     Bathroom Shower/Tub: Tub/shower unit;Walk-in shower   Bathroom Toilet: Handicapped height (has both)     Home Equipment: Shower seat;Other (comment);Grab bars - tub/shower;Adaptive equipment;Bedside commode Adaptive Equipment: Reacher Additional Comments: has rollator      Prior Functioning/Environment Level of Independence: Independent with assistive device(s)        Comments: Pt reports using rollator for mobility, indep with ADL tasks, meal prep, cleaning, med mgt, and drives, 2 falls in 49mo        OT Problem List: Decreased strength;Pain;Decreased range of motion;Impaired balance (sitting and/or standing);Decreased activity tolerance;Decreased knowledge of use of DME or AE      OT Treatment/Interventions: Self-care/ADL training;Therapeutic exercise;Therapeutic activities;DME and/or AE instruction;Patient/family education;Balance training;Energy conservation    OT Goals(Current goals can be found in the care plan section) Acute Rehab OT  Goals Patient Stated Goal: to return home OT Goal Formulation: With patient Time For Goal Achievement: 08/06/20 Potential to Achieve Goals: Good ADL Goals Pt Will Perform Lower Body Dressing: with supervision;with set-up;with adaptive equipment;sit to/from stand Pt Will Transfer to Toilet: with supervision;ambulating (BSC over toilet, LRAD For amb, [place] donned) Additional ADL Goal #1: Pt/caregiver will be modified independent with knee immobilizer mgt Additional ADL Goal #2: Pt will verbalize plan to implement at least 1 learned falls prevention strategy to maximize safety  OT Frequency: Min 2X/week   Barriers to D/C:            Co-evaluation              AM-PAC OT "6 Clicks" Daily Activity     Outcome Measure Help from another person eating meals?: None Help from another person taking care of personal grooming?: None Help from another person toileting, which includes using toliet, bedpan, or urinal?: A Little Help from another person bathing (including washing, rinsing, drying)?: A Little Help from another person to put on and taking off regular upper body clothing?: None Help from another person to put on and taking off regular lower body clothing?: A Little 6 Click Score: 21   End of Session    Activity Tolerance: Patient tolerated treatment well  Patient left: in bed;with call bell/phone within reach;with bed alarm set;Other (comment) (rolled towel under L ankle, L KI in place)  OT Visit Diagnosis: Other abnormalities of gait and mobility (R26.89);History of falling (Z91.81);Pain Pain - Right/Left: Left Pain - part of body: Knee                Time: 1344-1411 OT Time Calculation (min): 27 min Charges:  OT General Charges $OT Visit: 1 Visit OT Evaluation $OT Eval Moderate Complexity: 1 Mod OT Treatments $Self Care/Home Management : 23-37 mins  Hanley Hays, MPH, MS, OTR/L ascom [phone] 07/23/20, 2:57 PM

## 2020-07-23 NOTE — Progress Notes (Signed)
PROGRESS NOTE   HPI was taken from Dr. Francine Graven: Christine Obrien is a 80 y.o. female with medical history significant for chronic systolic heart failure, last known LVEF of 35%, depression, COPD, diabetes mellitus complications of stage III chronic kidney disease, GERD, hypertension, hypothyroidism, status post recent left patella fracture repair who presents to the emergency room for evaluation of exertional shortness of breath which she has had for 2 days as well as worsening knee pain. Patient notes that she has shortness of breath with moderate exertion and has to rest after ambulating to the bathroom to be able to catch her breath.  She denies having any lower extremity swelling, no chest pain and denies having any orthopnea or paroxysmal nocturnal dyspnea. She denies having any fever or chills, no cough, no dizziness or lightheadedness, no abdominal pain, no nausea, no vomiting, no urinary frequency, no dysuria, no nocturia, no diaphoresis or palpitations. Labs show sodium 135, potassium 3.3, chloride 102, bicarb 24, glucose 187, BUN 11, creatinine 0.8, calcium 9.0, alkaline phosphatase 47, albumin 3.9, AST 25,, ALT 18, total protein 6.6, BNP 758, troponin 59 >>78, white count 8.0, hemoglobin 11.9, hematocrit 33.2, MCV 96, RDW 13.9, platelet count 162, PT 13.8, INR 1.1 Respiratory viral panel is negative CT angiogram of the chest showed no evidence of pulmonary embolism. Small bilateral pleural effusions with associated bibasilar atelectasis. Focal airspace density over the right suprahilar region which may be due to atelectasis or possibly infection. Nodular biapical pleural thickening right worse than left with continuous second area of nodularity over the posterior right upper lobe measuring 0.8 x 1.8 cm unchanged from recent neck CT 05/28/2020 and likely due to chronic scarring, although indeterminate. Recommend follow-up chest CT 3 months versus PET-CT for further Evaluation. Aortic  atherosclerosis. Stable left adrenal adenoma. Twelve-lead EKG reviewed by me shows sinus tachycardia with a left bundle branch block.   ED Course: Patient is a 80 year old female who presents to the ER for evaluation of exertional shortness of breath and worsening left knee pain.  She is status post recent left patella repair.  Imaging shows small bilateral pleural effusions.  Patient's last known LVEF is about 35% from 11/21.  She received a dose of Lasix in the ER.  Her troponin is slightly elevated and she will be admitted to the hospital for further evaluation.    Christine Obrien  OVZ:[id number] DOB: 10-31-1940 DOA: 07/21/2020 PCP: [name], MD   Assessment & Plan:   Principal Problem:   Acute systolic CHF (congestive heart failure) ([place]) Active Problems:   Coronary artery disease involving native heart   Anxiety, generalized   Essential hypertension   Hypothyroidism   CKD stage 3 due to type 2 diabetes mellitus ([place])   Nicotine dependence   Acute on chronic systolic CHF: echo shows EF of 35% in Nov 2021. Continue on IV lasix. Monitor I/Os and daily weights. Neg approx 544mL. Continue on metoprolol, losartan. Cardio is following and recs apprec   Hypokalemia: KCl repleted. Will continue to monitor   HTN: continue on BB, ARB  DM2: continue on SSI w/ accuchecks. Likely well controlled   Nicotine dependence: smoking cessation counseling   Depression: severity unknown. Continue on home dose venlafaxine   Hx of CAD: s/p CABG. Continue on BB, zetia, aspirin   Left patella fracture: s/p repair. Left knee remains immobilized.  Hypothyroidism: continue on home dose of levothyroxine   PAF & DVT: continue on eliquis   Elevated troponin: likely secondary to demand ischemia.  Cardio is following      DVT prophylaxis: eliquis  Code Status: full  Family Communication: called pt's daughter but no answer today  Disposition Plan: likely d/c back home  Level of care:  Progressive Cardiac   Status is: Inpatient  Remains inpatient appropriate because:Unsafe d/c plan, IV treatments appropriate due to intensity of illness or inability to take PO and Inpatient level of care appropriate due to severity of illness   Dispo: The patient is from: Home              Anticipated d/c is to: Home              Patient currently is not medically stable to d/c.   Difficult to place patient No    Consultants:   Cardio    Procedures:  Antimicrobials:   Subjective: Pt c/o malaise   Objective: Vitals:   07/22/20 2321 07/23/20 0455 07/23/20 0900 07/23/20 1123  BP: (!) 136/48 105/63 (!) 82/63 (!) 149/69  Pulse: 61 62 66 68  Resp:  18 18   Temp:  98.6 F (37 C) 98.1 F (36.7 C) 98.1 F (36.7 C)  TempSrc:   Oral Oral  SpO2:  96% 96% 92%  Weight:  67.5 kg    Height:        Intake/Output Summary (Last 24 hours) at 07/23/2020 1321 Last data filed at 07/23/2020 1125 Gross per 24 hour  Intake 820 ml  Output 1350 ml  Net -530 ml   Filed Weights   07/21/20 0814 07/22/20 0500 07/23/20 0455  Weight: 65.3 kg 67.1 kg 67.5 kg    Examination:  General exam: Appears comfortable  Respiratory system: decreased breath sounds b/l  Cardiovascular system: S1/S2+. No rubs or clicks  Gastrointestinal system: Abd is soft, NT, ND & hypoactive bowel sounds  Central nervous system:  Alert and oriented. Moves all extremities   Psychiatry: Judgement and insight appear normal. Flat mood and affect     Data Reviewed: I have personally reviewed following labs and imaging studies  CBC: Recent Labs  Lab 07/18/20 2040 07/21/20 0818 07/22/20 0753 07/23/20 0347  WBC 6.5 8.0 7.4 6.8  NEUTROABS 4.4  --   --   --   HGB 12.5 11.1* 11.6* 10.4*  HCT 37.3 33.2* 34.6* 32.3*  MCV 95.4 96.0 97.7 98.8  PLT 202 162 186 716   Basic Metabolic Panel: Recent Labs  Lab 07/18/20 2040 07/21/20 0818 07/22/20 0355 07/23/20 0347  NA 137 135 139 138  K 3.6 3.3* 3.6 3.4*  CL 104  102 102 102  CO2 24 24 29 28   GLUCOSE 131* 187* 143* 146*  BUN 18 11 15 21   CREATININE 0.97 0.88 1.01* 0.96  CALCIUM 9.5 9.0 9.3 9.4   GFR: Estimated Creatinine Clearance: 44.5 mL/min (by C-G formula based on SCr of 0.96 mg/dL). Liver Function Tests: Recent Labs  Lab 07/21/20 0818  AST 25  ALT 18  ALKPHOS 47  BILITOT 1.0  PROT 6.6  ALBUMIN 3.1*   No results for input(s): LIPASE, AMYLASE in the last 168 hours. No results for input(s): AMMONIA in the last 168 hours. Coagulation Profile: Recent Labs  Lab 07/18/20 2040  INR 1.1   Cardiac Enzymes: No results for input(s): CKTOTAL, CKMB, CKMBINDEX, TROPONINI in the last 168 hours. BNP (last 3 results) No results for input(s): PROBNP in the last 8760 hours. HbA1C: No results for input(s): HGBA1C in the last 72 hours. CBG: No results for input(s): GLUCAP in the  last 168 hours. Lipid Profile: No results for input(s): CHOL, HDL, LDLCALC, TRIG, CHOLHDL, LDLDIRECT in the last 72 hours. Thyroid Function Tests: No results for input(s): TSH, T4TOTAL, FREET4, T3FREE, THYROIDAB in the last 72 hours. Anemia Panel: No results for input(s): VITAMINB12, FOLATE, FERRITIN, TIBC, IRON, RETICCTPCT in the last 72 hours. Sepsis Labs: No results for input(s): PROCALCITON, LATICACIDVEN in the last 168 hours.  Recent Results (from the past 240 hour(s))  Resp Panel by RT-PCR (Flu A&B, Covid) Nasopharyngeal Swab     Status: None   Collection Time: 07/21/20  8:18 AM   Specimen: Nasopharyngeal Swab; Nasopharyngeal(NP) swabs in vial transport medium  Result Value Ref Range Status   SARS Coronavirus 2 by RT PCR NEGATIVE NEGATIVE Final    Comment: (NOTE) SARS-CoV-2 target nucleic acids are NOT DETECTED.  The SARS-CoV-2 RNA is generally detectable in upper respiratory specimens during the acute phase of infection. The lowest concentration of SARS-CoV-2 viral copies this assay can detect is 138 copies/mL. A negative result does not preclude  SARS-Cov-2 infection and should not be used as the sole basis for treatment or other patient management decisions. A negative result may occur with  improper specimen collection/handling, submission of specimen other than nasopharyngeal swab, presence of viral mutation(s) within the areas targeted by this assay, and inadequate number of viral copies(<138 copies/mL). A negative result must be combined with clinical observations, patient history, and epidemiological information. The expected result is Negative.  Fact Sheet for Patients:  EntrepreneurPulse.com.au  Fact Sheet for Healthcare Providers:  IncredibleEmployment.be  This test is no t yet approved or cleared by the [place] FDA and  has been authorized for detection and/or diagnosis of SARS-CoV-2 by FDA under an Emergency Use Authorization (EUA). This EUA will remain  in effect (meaning this test can be used) for the duration of the COVID-19 declaration under Section 564(b)(1) of the Act, 21 U.S.C.section 360bbb-3(b)(1), unless the authorization is terminated  or revoked sooner.       Influenza A by PCR NEGATIVE NEGATIVE Final   Influenza B by PCR NEGATIVE NEGATIVE Final    Comment: (NOTE) The Xpert Xpress SARS-CoV-2/FLU/RSV plus assay is intended as an aid in the diagnosis of influenza from Nasopharyngeal swab specimens and should not be used as a sole basis for treatment. Nasal washings and aspirates are unacceptable for Xpert Xpress SARS-CoV-2/FLU/RSV testing.  Fact Sheet for Patients: EntrepreneurPulse.com.au  Fact Sheet for Healthcare Providers: IncredibleEmployment.be  This test is not yet approved or cleared by the [place] FDA and has been authorized for detection and/or diagnosis of SARS-CoV-2 by FDA under an Emergency Use Authorization (EUA). This EUA will remain in effect (meaning this test can be used) for the duration of  the COVID-19 declaration under Section 564(b)(1) of the Act, 21 U.S.C. section 360bbb-3(b)(1), unless the authorization is terminated or revoked.  Performed at [facility], [address] [place], [place] [address]          Radiology Studies: No results found.      Scheduled Meds: . apixaban  5 mg Oral BID  . ezetimibe  10 mg Oral Daily  . ferrous sulfate  325 mg Oral BID WC  . fluticasone  2 spray Each Nare Daily  . furosemide  40 mg Intravenous Daily  . levothyroxine  137 mcg Oral Q0600  . loratadine  10 mg Oral Daily  . losartan  25 mg Oral Daily  . metoprolol tartrate  25 mg Oral BID  . mometasone-formoterol  2 puff Inhalation BID  . multivitamin with minerals  1 tablet Oral Daily  . omega-3 acid ethyl esters  2 g Oral BID  . pantoprazole  40 mg Oral Daily  . sodium chloride flush  3 mL Intravenous Q12H  . venlafaxine  75 mg Oral BID WC   Continuous Infusions: . sodium chloride       LOS: 2 days    Time spent: 30 mins     Wyvonnia Dusky, MD [facility] Pager [phone]  If 7PM-7AM, please contact night-coverage 07/23/2020, 1:21 PM

## 2020-07-23 NOTE — Progress Notes (Signed)
  Subjective:  Patient reports pain as moderate.   Objective:   VITALS:   Vitals:   07/22/20 2321 07/23/20 0455 07/23/20 0900 07/23/20 1123  BP: (!) 136/48 105/63 (!) 82/63 (!) 149/69  Pulse: 61 62 66 68  Resp:  18 18   Temp:  98.6 F (37 C) 98.1 F (36.7 C) 98.1 F (36.7 C)  TempSrc:   Oral Oral  SpO2:  96% 96% 92%  Weight:  67.5 kg    Height:        PHYSICAL EXAM:  Sensation intact distally Dorsiflexion/Plantar flexion intact Incision: dressing C/D/I No cellulitis present Compartment soft  LABS  Results for orders placed or performed during the hospital encounter of 07/21/20 (from the past 24 hour(s))  Basic metabolic panel     Status: Abnormal   Collection Time: 07/23/20  3:47 AM  Result Value Ref Range   Sodium 138 135 - 145 mmol/L   Potassium 3.4 (L) 3.5 - 5.1 mmol/L   Chloride 102 98 - 111 mmol/L   CO2 28 22 - 32 mmol/L   Glucose, Bld 146 (H) 70 - 99 mg/dL   BUN 21 8 - 23 mg/dL   Creatinine, Ser 0.96 0.44 - 1.00 mg/dL   Calcium 9.4 8.9 - 10.3 mg/dL   GFR, Estimated >60 >60 mL/min   Anion gap 8 5 - 15  CBC     Status: Abnormal   Collection Time: 07/23/20  3:47 AM  Result Value Ref Range   WBC 6.8 4.0 - 10.5 K/uL   RBC 3.27 (L) 3.87 - 5.11 MIL/uL   Hemoglobin 10.4 (L) 12.0 - 15.0 g/dL   HCT 32.3 (L) 36.0 - 46.0 %   MCV 98.8 80.0 - 100.0 fL   MCH 31.8 26.0 - 34.0 pg   MCHC 32.2 30.0 - 36.0 g/dL   RDW 13.8 11.5 - 15.5 %   Platelets 203 150 - 400 K/uL   nRBC 0.0 0.0 - 0.2 %    No results found.  Assessment/Plan:     Principal Problem:   Acute systolic CHF (congestive heart failure) ([facility]) Active Problems:   Coronary artery disease involving native heart   Anxiety, generalized   Essential hypertension   Hypothyroidism   CKD stage 3 due to type 2 diabetes mellitus ([place])   Nicotine dependence   Up with therapy, must be in knee immobilizer at all times except hygiene purposes OK for transfer from ortho standpoint Dressing changed Follow up  is already scheduled Please call with questions   Lovell Sheehan , MD 07/23/2020, 3:30 PM

## 2020-07-23 NOTE — Consult Note (Addendum)
   Heart Failure Nurse Navigator Note  Hfr EF 35%   She presented to the emergency room with complaints of increasing shortness of breath that she had noticed for the past 2 days, worse with exertion.   Comorbidities:  Depression COPD Diabetes Stage III chronic kidney disease GERD Hypertension Coronary artery disease/CABG/stenting  History of ICD.  Medications:  Apixaban 5 mg twice a day Zetia 10 mg daily Furosemide 40 mg IV daily Losartan 25 mg daily next metoprolol tartrate 25 mg daily   Labs:   Sodium 138, potassium 3.4, chloride 102, CO2 28, BUN 21, creatinine 0.96, hemoglobin 10.4, hematocrit 32.5 Intake 720 mL Output 1200 mL   Assessment:  General-she is awake and alert sitting up in the chair at bedside, states that she is in  a lot of pain with her knee and just not feeling well.  HEENT-pupils are equal, normocephalic  Cardiac-heart tones are regular rate and rhythm  Chest-breath sounds are clear to posterior auscultation  Abdomen-soft nontender  Musculoskeletal-left leg is in race.  Psych-is pleasant and appropriate, makes good eye contact.  Neurologic-speech is clear   Initial meeting with patient after she was admitted with increasing shortness of breath.  Discussed her diet, she does admit to eating hot dogs, some prepared foods.  She also admits to cooking with salt but does not add it at the table.  Discussed the importance of weighing daily, reporting 2 to 3 pound weight gain overnight or 5 pounds within a week.  Also discussed with her the outpatient heart failure clinic which to be another option for her to call if she was having problems.  Due to her being uncomfortable told her that I would come back and talk with her at more length tomorrow, left her reading materials on living with heart failure, the zone magnet and information about the heart failure clinic.   [name] RN [facility]

## 2020-07-23 NOTE — Evaluation (Addendum)
Physical Therapy Evaluation Patient Details Name: Christine Obrien MRN: [id number] DOB: 01/29/1941 Today's Date: 07/23/2020   History of Present Illness  Christine Obrien is a 80 y.o. female with medical history significant for chronic systolic heart failure, last known LVEF of 35%, depression, COPD, diabetes mellitus complications of stage III chronic kidney disease, GERD, hypertension, hypothyroidism, status post recent left patella fracture repair who presents to the emergency room for evaluation of exertional shortness of breath which she has had for 2 days as well as worsening knee pain.    Clinical Impression  Patient received in bed, reports she is having trouble breathing. Patient received on 3 lpm when arrived, RN decreased to 1 lpm during session. Patient requires min assist with bed mobility, min guard for sit to stand and min guard for ambulation of 3 feet with RW and WBAT, KI donned. Patient will continue to benefit from skilled PT while here to improve functional strength, safety and independence.       Follow Up Recommendations Home health PT;Supervision for mobility/OOB    Equipment Recommendations  None recommended by PT    Recommendations for Other Services       Precautions / Restrictions Precautions Precautions: Fall Restrictions Weight Bearing Restrictions: No      Mobility  Bed Mobility Overal bed mobility: Needs Assistance Bed Mobility: Supine to Sit     Supine to sit: Min assist     General bed mobility comments: Min assist to raise ttrunk to seated position min assist to bring left LE off bed.     Transfers Overall transfer level: Needs assistance Equipment used: Rolling walker (2 wheeled) Transfers: Sit to/from Stand Sit to Stand: Min guard            Ambulation/Gait Ambulation/Gait assistance: Min guard Gait Distance (Feet): 3 Feet Assistive device: Rolling walker (2 wheeled) Gait Pattern/deviations: Step-to pattern;Decreased step length -  right;Decreased step length - left Gait velocity: decreased   General Gait Details: patient ambulated from bed to recliner with RW and KI. SOB with mobility and pain in left LE  Stairs            Wheelchair Mobility    Modified Rankin (Stroke Patients Only)       Balance Overall balance assessment: Needs assistance   Sitting balance-Leahy Scale: Good     Standing balance support: Bilateral upper extremity supported;During functional activity Standing balance-Leahy Scale: Fair Standing balance comment: reliant on B UE support due to pain in left LE                             Pertinent Vitals/Pain Pain Assessment: 0-10 Pain Score: 4  Pain Location: L knee, left hand Pain Descriptors / Indicators: Sore;Grimacing;Guarding Pain Intervention(s): Monitored during session;Limited activity within patient's tolerance;Premedicated before session;Repositioned    Home Living Family/patient expects to be discharged to:: Private residence Living Arrangements: Children Available Help at Discharge: Family;Available PRN/intermittently Type of Home: House Home Access: Stairs to enter   [employer] of Steps: 1 Home Layout: One level Home Equipment: Walker - 2 wheels      Prior Function                 Hand Dominance        Extremity/Trunk Assessment   Upper Extremity Assessment Upper Extremity Assessment: Defer to OT evaluation    Lower Extremity Assessment Lower Extremity Assessment: Overall WFL for tasks assessed    Cervical /  Trunk Assessment Cervical / Trunk Assessment: Normal  Communication   Communication: HOH  Cognition Arousal/Alertness: Awake/alert Behavior During Therapy: WFL for tasks assessed/performed Overall Cognitive Status: Within Functional Limits for tasks assessed                                        General Comments      Exercises     Assessment/Plan    PT Assessment Patient needs  continued PT services  PT Problem List Decreased strength;Decreased mobility;Decreased range of motion;Decreased activity tolerance;Decreased balance;Pain;Decreased safety awareness;Cardiopulmonary status limiting activity       PT Treatment Interventions DME instruction;Therapeutic exercise;Gait training;Balance training;Stair training;Functional mobility training;Therapeutic activities;Patient/family education    PT Goals (Current goals can be found in the Care Plan section)  Acute Rehab PT Goals Patient Stated Goal: to return home PT Goal Formulation: With patient Time For Goal Achievement: 07/30/20 Potential to Achieve Goals: Good    Frequency Min 2X/week   Barriers to discharge Inaccessible home environment;Decreased caregiver support      Co-evaluation               AM-PAC PT "6 Clicks" Mobility  Outcome Measure Help needed turning from your back to your side while in a flat bed without using bedrails?: A Little Help needed moving from lying on your back to sitting on the side of a flat bed without using bedrails?: A Little Help needed moving to and from a bed to a chair (including a wheelchair)?: A Little Help needed standing up from a chair using your arms (e.g., wheelchair or bedside chair)?: A Little Help needed to walk in hospital room?: A Little Help needed climbing 3-5 steps with a railing? : A Little 6 Click Score: 18    End of Session Equipment Utilized During Treatment: Gait belt;Left knee immobilizer;Oxygen Activity Tolerance: Patient limited by pain;Other (comment) (SOB) Patient left: in chair;with call bell/phone within reach;with chair alarm set Nurse Communication: Mobility status PT Visit Diagnosis: History of falling (Z91.81);Pain;Muscle weakness (generalized) (M62.81) Pain - Right/Left: Left Pain - part of body: Knee    Time: 0920-0952 PT Time Calculation (min) (ACUTE ONLY): 32 min   Charges:   PT Evaluation $PT Eval Moderate Complexity: 1  Mod PT Treatments $Therapeutic Activity: 8-22 mins        Khiem Gargis, PT, GCS 07/23/20,10:54 AM

## 2020-07-24 DIAGNOSIS — I5021 Acute systolic (congestive) heart failure: Secondary | ICD-10-CM | POA: Diagnosis not present

## 2020-07-24 LAB — BRAIN NATRIURETIC PEPTIDE: B Natriuretic Peptide: 555 pg/mL — ABNORMAL HIGH (ref 0.0–100.0)

## 2020-07-24 LAB — BASIC METABOLIC PANEL
Anion gap: 7 (ref 5–15)
BUN: 20 mg/dL (ref 8–23)
CO2: 30 mmol/L (ref 22–32)
Calcium: 9.4 mg/dL (ref 8.9–10.3)
Chloride: 101 mmol/L (ref 98–111)
Creatinine, Ser: 0.88 mg/dL (ref 0.44–1.00)
GFR, Estimated: >60 mL/min (ref >60)
Glucose, Bld: 162 mg/dL — ABNORMAL HIGH (ref 70–99)
Potassium: 3.4 mmol/L — ABNORMAL LOW (ref 3.5–5.1)
Sodium: 138 mmol/L (ref 135–145)

## 2020-07-24 LAB — CBC
HCT: 32.3 % — ABNORMAL LOW (ref 36.0–46.0)
Hemoglobin: 10.6 g/dL — ABNORMAL LOW (ref 12.0–15.0)
MCH: 31.7 pg (ref 26.0–34.0)
MCHC: 32.8 g/dL (ref 30.0–36.0)
MCV: 96.7 fL (ref 80.0–100.0)
Platelets: 216 10*3/uL (ref 150–400)
RBC: 3.34 MIL/uL — ABNORMAL LOW (ref 3.87–5.11)
RDW: 13.5 % (ref 11.5–15.5)
WBC: 6.1 10*3/uL (ref 4.0–10.5)
nRBC: 0 % (ref 0.0–0.2)

## 2020-07-24 MED ORDER — METHOCARBAMOL 500 MG PO TABS
500.0000 mg | ORAL_TABLET | Freq: Four times a day (QID) | ORAL | Status: DC | PRN
  Administered 2020-07-24 – 2020-07-25 (×2): 500 mg via ORAL
  Filled 2020-07-24 (×3): qty 1

## 2020-07-24 MED ORDER — ROSUVASTATIN CALCIUM 5 MG PO TABS
5.0000 mg | ORAL_TABLET | Freq: Every day | ORAL | Status: DC
  Administered 2020-07-24 – 2020-07-25 (×2): 5 mg via ORAL
  Filled 2020-07-24 (×2): qty 1

## 2020-07-24 MED ORDER — POTASSIUM CHLORIDE CRYS ER 20 MEQ PO TBCR
40.0000 meq | EXTENDED_RELEASE_TABLET | Freq: Once | ORAL | Status: AC
  Administered 2020-07-24: 40 meq via ORAL
  Filled 2020-07-24: qty 2

## 2020-07-24 MED ORDER — CLOPIDOGREL BISULFATE 75 MG PO TABS
75.0000 mg | ORAL_TABLET | Freq: Every day | ORAL | Status: DC
  Administered 2020-07-24 – 2020-07-25 (×2): 75 mg via ORAL
  Filled 2020-07-24 (×2): qty 1

## 2020-07-24 NOTE — Progress Notes (Addendum)
PROGRESS NOTE   HPI was taken from Dr. Francine Graven: Christine Obrien is a 80 y.o. female with medical history significant for chronic systolic heart failure, last known LVEF of 35%, depression, COPD, diabetes mellitus complications of stage III chronic kidney disease, GERD, hypertension, hypothyroidism, status post recent left patella fracture repair who presents to the emergency room for evaluation of exertional shortness of breath which she has had for 2 days as well as worsening knee pain. Patient notes that she has shortness of breath with moderate exertion and has to rest after ambulating to the bathroom to be able to catch her breath.  She denies having any lower extremity swelling, no chest pain and denies having any orthopnea or paroxysmal nocturnal dyspnea. She denies having any fever or chills, no cough, no dizziness or lightheadedness, no abdominal pain, no nausea, no vomiting, no urinary frequency, no dysuria, no nocturia, no diaphoresis or palpitations. Labs show sodium 135, potassium 3.3, chloride 102, bicarb 24, glucose 187, BUN 11, creatinine 0.8, calcium 9.0, alkaline phosphatase 47, albumin 3.9, AST 25,, ALT 18, total protein 6.6, BNP 758, troponin 59 >>78, white count 8.0, hemoglobin 11.9, hematocrit 33.2, MCV 96, RDW 13.9, platelet count 162, PT 13.8, INR 1.1 Respiratory viral panel is negative CT angiogram of the chest showed no evidence of pulmonary embolism. Small bilateral pleural effusions with associated bibasilar atelectasis. Focal airspace density over the right suprahilar region which may be due to atelectasis or possibly infection. Nodular biapical pleural thickening right worse than left with continuous second area of nodularity over the posterior right upper lobe measuring 0.8 x 1.8 cm unchanged from recent neck CT 05/28/2020 and likely due to chronic scarring, although indeterminate. Recommend follow-up chest CT 3 months versus PET-CT for further Evaluation. Aortic  atherosclerosis. Stable left adrenal adenoma. Twelve-lead EKG reviewed by me shows sinus tachycardia with a left bundle branch block.   ED Course: Patient is a 80 year old female who presents to the ER for evaluation of exertional shortness of breath and worsening left knee pain.  She is status post recent left patella repair.  Imaging shows small bilateral pleural effusions.  Patient's last known LVEF is about 35% from 11/21.  She received a dose of Lasix in the ER.  Her troponin is slightly elevated and she will be admitted to the hospital for further evaluation.  4/5- no overnight issues. Starting to feel better.    Christine Obrien  VZD:[id number] DOB: 06/10/1940 DOA: 07/21/2020 PCP: [name], MD   Assessment & Plan:   Principal Problem:   Acute systolic CHF (congestive heart failure) ([place]) Active Problems:   Coronary artery disease involving native heart   Anxiety, generalized   Essential hypertension   Hypothyroidism   CKD stage 3 due to type 2 diabetes mellitus ([place])   Nicotine dependence   Acute on chronic systolic CHF: echo shows EF of 35% in Nov 2021.  Clinically improving, not at base line yet BNP trending down Continue iv lasix  I/o Daily weight Continue ARB and beta blk Cardiac cath deferred Cards following IS Wean off 02   Hypokalemia: kcl given. Monitor labs   HTN: stable, continue bb , arb   DM2:bg stable Continue riss   Nicotine dependence: smoking cessation counseling   Depression: severity unknown. Continue on home dose venlafaxine   Hx of CAD: s/p CABG. Continue on BB, zetia, aspirin   Left patella fracture: s/p repair. Left knee remains immobilized.  Hypothyroidism: continue on home dose of levothyroxine   PAF &  DVT: continue on eliquis   Elevated troponin: likely secondary to demand ischemia. Cardio is following      DVT prophylaxis: eliquis  Code Status: full  Family Communication:daughter at bedside Disposition Plan:  likely d/c back home  Level of care: Progressive Cardiac   Status is: Inpatient  Remains inpatient appropriate because:Unsafe d/c plan, IV treatments appropriate due to intensity of illness or inability to take PO and Inpatient level of care appropriate due to severity of illness   Dispo: The patient is from: Home              Anticipated d/c is to: Home              Patient currently is not medically stable to d/c.   Difficult to place patient No    Consultants:   Cardio    Procedures:  Antimicrobials:   Subjective: Starting to feel better  Objective: Vitals:   07/23/20 2222 07/23/20 2305 07/24/20 0347 07/24/20 0837  BP: 137/74 114/67 107/75 104/67  Pulse: (!) 142 71 71 73  Resp: 14 18 14 19   Temp:  98.1 F (36.7 C) 98.3 F (36.8 C) 97.8 F (36.6 C)  TempSrc:   Oral Oral  SpO2: 92% 95% 99% 97%  Weight:   65.8 kg   Height:        Intake/Output Summary (Last 24 hours) at 07/24/2020 1450 Last data filed at 07/24/2020 1345 Gross per 24 hour  Intake 1400 ml  Output 2850 ml  Net -1450 ml   Filed Weights   07/22/20 0500 07/23/20 0455 07/24/20 0347  Weight: 67.1 kg 67.5 kg 65.8 kg    Examination:  Calm, comfortable Scattered Rales at the bases no wheezing Regular S1-S2 no gallops Soft benign positive bowel sounds Trace pedal edema bilaterally Awake alert oriented x3 Mood and affect appropriate in current setting    Data Reviewed: I have personally reviewed following labs and imaging studies  CBC: Recent Labs  Lab 07/18/20 2040 07/21/20 0818 07/22/20 0753 07/23/20 0347 07/24/20 0412  WBC 6.5 8.0 7.4 6.8 6.1  NEUTROABS 4.4  --   --   --   --   HGB 12.5 11.1* 11.6* 10.4* 10.6*  HCT 37.3 33.2* 34.6* 32.3* 32.3*  MCV 95.4 96.0 97.7 98.8 96.7  PLT 202 162 186 203 250   Basic Metabolic Panel: Recent Labs  Lab 07/18/20 2040 07/21/20 0818 07/22/20 0355 07/23/20 0347 07/24/20 0412  NA 137 135 139 138 138  K 3.6 3.3* 3.6 3.4* 3.4*  CL 104 102  102 102 101  CO2 24 24 29 28 30   GLUCOSE 131* 187* 143* 146* 162*  BUN 18 11 15 21 20   CREATININE 0.97 0.88 1.01* 0.96 0.88  CALCIUM 9.5 9.0 9.3 9.4 9.4   GFR: Estimated Creatinine Clearance: 48.1 mL/min (by C-G formula based on SCr of 0.88 mg/dL). Liver Function Tests: Recent Labs  Lab 07/21/20 0818  AST 25  ALT 18  ALKPHOS 47  BILITOT 1.0  PROT 6.6  ALBUMIN 3.1*   No results for input(s): LIPASE, AMYLASE in the last 168 hours. No results for input(s): AMMONIA in the last 168 hours. Coagulation Profile: Recent Labs  Lab 07/18/20 2040  INR 1.1   Cardiac Enzymes: No results for input(s): CKTOTAL, CKMB, CKMBINDEX, TROPONINI in the last 168 hours. BNP (last 3 results) No results for input(s): PROBNP in the last 8760 hours. HbA1C: No results for input(s): HGBA1C in the last 72 hours. CBG: No results for input(s):  GLUCAP in the last 168 hours. Lipid Profile: No results for input(s): CHOL, HDL, LDLCALC, TRIG, CHOLHDL, LDLDIRECT in the last 72 hours. Thyroid Function Tests: No results for input(s): TSH, T4TOTAL, FREET4, T3FREE, THYROIDAB in the last 72 hours. Anemia Panel: No results for input(s): VITAMINB12, FOLATE, FERRITIN, TIBC, IRON, RETICCTPCT in the last 72 hours. Sepsis Labs: No results for input(s): PROCALCITON, LATICACIDVEN in the last 168 hours.  Recent Results (from the past 240 hour(s))  Resp Panel by RT-PCR (Flu A&B, Covid) Nasopharyngeal Swab     Status: None   Collection Time: 07/21/20  8:18 AM   Specimen: Nasopharyngeal Swab; Nasopharyngeal(NP) swabs in vial transport medium  Result Value Ref Range Status   SARS Coronavirus 2 by RT PCR NEGATIVE NEGATIVE Final    Comment: (NOTE) SARS-CoV-2 target nucleic acids are NOT DETECTED.  The SARS-CoV-2 RNA is generally detectable in upper respiratory specimens during the acute phase of infection. The lowest concentration of SARS-CoV-2 viral copies this assay can detect is 138 copies/mL. A negative result does  not preclude SARS-Cov-2 infection and should not be used as the sole basis for treatment or other patient management decisions. A negative result may occur with  improper specimen collection/handling, submission of specimen other than nasopharyngeal swab, presence of viral mutation(s) within the areas targeted by this assay, and inadequate number of viral copies(<138 copies/mL). A negative result must be combined with clinical observations, patient history, and epidemiological information. The expected result is Negative.  Fact Sheet for Patients:  EntrepreneurPulse.com.au  Fact Sheet for Healthcare Providers:  IncredibleEmployment.be  This test is no t yet approved or cleared by the [place] FDA and  has been authorized for detection and/or diagnosis of SARS-CoV-2 by FDA under an Emergency Use Authorization (EUA). This EUA will remain  in effect (meaning this test can be used) for the duration of the COVID-19 declaration under Section 564(b)(1) of the Act, 21 U.S.C.section 360bbb-3(b)(1), unless the authorization is terminated  or revoked sooner.       Influenza A by PCR NEGATIVE NEGATIVE Final   Influenza B by PCR NEGATIVE NEGATIVE Final    Comment: (NOTE) The Xpert Xpress SARS-CoV-2/FLU/RSV plus assay is intended as an aid in the diagnosis of influenza from Nasopharyngeal swab specimens and should not be used as a sole basis for treatment. Nasal washings and aspirates are unacceptable for Xpert Xpress SARS-CoV-2/FLU/RSV testing.  Fact Sheet for Patients: EntrepreneurPulse.com.au  Fact Sheet for Healthcare Providers: IncredibleEmployment.be  This test is not yet approved or cleared by the [place] FDA and has been authorized for detection and/or diagnosis of SARS-CoV-2 by FDA under an Emergency Use Authorization (EUA). This EUA will remain in effect (meaning this test can be used) for the  duration of the COVID-19 declaration under Section 564(b)(1) of the Act, 21 U.S.C. section 360bbb-3(b)(1), unless the authorization is terminated or revoked.  Performed at [facility], [address] [place], [place] [address]          Radiology Studies: No results found.      Scheduled Meds: . apixaban  5 mg Oral BID  . ezetimibe  10 mg Oral Daily  . ferrous sulfate  325 mg Oral BID WC  . fluticasone  2 spray Each Nare Daily  . furosemide  40 mg Intravenous Daily  . levothyroxine  137 mcg Oral Q0600  . loratadine  10 mg Oral Daily  . losartan  25 mg Oral Daily  . metoprolol tartrate  25 mg Oral BID  .  mometasone-formoterol  2 puff Inhalation BID  . multivitamin with minerals  1 tablet Oral Daily  . omega-3 acid ethyl esters  2 g Oral BID  . pantoprazole  40 mg Oral Daily  . sodium chloride flush  3 mL Intravenous Q12H  . venlafaxine  75 mg Oral BID WC   Continuous Infusions: . sodium chloride       LOS: 3 days    Time spent: 35 mins with more than 50% on [place], MD [facility] Pager [phone]  If 7PM-7AM, please contact night-coverage 07/24/2020, 2:50 PM

## 2020-07-24 NOTE — Progress Notes (Signed)
   Heart Failure Nurse Navigator Note    Met with patient today, she states prior to that she had not been weighing herself daily but in encouraged to do so.  Also discussed reading labels and following low-sodium diet of less than 2000 mg daily.  Along with less than 64 ounces of fluid restriction daily.  [name] RN CHFN

## 2020-07-24 NOTE — Progress Notes (Signed)
[facility] Cardiology    SUBJECTIVE: The patient reports still have shortness of breath when lying flat while on bedpan, and felt much better sitting up. She denies chest pain or palpitations.   Vitals:   07/23/20 2222 07/23/20 2305 07/24/20 0347 07/24/20 0837  BP: 137/74 114/67 107/75 104/67  Pulse: (!) 142 71 71 73  Resp: 14 18 14 19   Temp:  98.1 F (36.7 C) 98.3 F (36.8 C) 97.8 F (36.6 C)  TempSrc:   Oral Oral  SpO2: 92% 95% 99% 97%  Weight:   65.8 kg   Height:         Intake/Output Summary (Last 24 hours) at 07/24/2020 0847 Last data filed at 07/24/2020 5329 Gross per 24 hour  Intake 1640 ml  Output 2050 ml  Net -410 ml      PHYSICAL EXAM  General: Well developed, well nourished, in no acute distress HEENT:  Normocephalic and atramatic Neck:  No JVD.  Lungs: normal effort of breathing on supp O2 via [place], wheezing Heart: HRRR . Normal S1 and S2 without gallops or murmurs.  Abdomen: nondistended  Msk:  Back normal, normal gait. Normal strength and tone for age. Extremities: No clubbing, cyanosis or edema.   Neuro: Alert and oriented X 3. Psych:  Good affect, responds appropriately   LABS: Basic Metabolic Panel: Recent Labs    07/23/20 0347 07/24/20 0412  NA 138 138  K 3.4* 3.4*  CL 102 101  CO2 28 30  GLUCOSE 146* 162*  BUN 21 20  CREATININE 0.96 0.88  CALCIUM 9.4 9.4   Liver Function Tests: No results for input(s): AST, ALT, ALKPHOS, BILITOT, PROT, ALBUMIN in the last 72 hours. No results for input(s): LIPASE, AMYLASE in the last 72 hours. CBC: Recent Labs    07/23/20 0347 07/24/20 0412  WBC 6.8 6.1  HGB 10.4* 10.6*  HCT 32.3* 32.3*  MCV 98.8 96.7  PLT 203 216   Cardiac Enzymes: No results for input(s): CKTOTAL, CKMB, CKMBINDEX, TROPONINI in the last 72 hours. BNP: Invalid input(s): POCBNP D-Dimer: No results for input(s): DDIMER in the last 72 hours. Hemoglobin A1C: No results for input(s): HGBA1C in the last 72 hours. Fasting Lipid  Panel: No results for input(s): CHOL, HDL, LDLCALC, TRIG, CHOLHDL, LDLDIRECT in the last 72 hours. Thyroid Function Tests: No results for input(s): TSH, T4TOTAL, T3FREE, THYROIDAB in the last 72 hours.  Invalid input(s): FREET3 Anemia Panel: No results for input(s): VITAMINB12, FOLATE, FERRITIN, TIBC, IRON, RETICCTPCT in the last 72 hours.  No results found.   TELEMETRY: paced rhythm  ASSESSMENT AND PLAN:  Principal Problem:   Acute systolic CHF (congestive heart failure) ([facility]) Active Problems:   Coronary artery disease involving native heart   Anxiety, generalized   Essential hypertension   Hypothyroidism   CKD stage 3 due to type 2 diabetes mellitus ([facility])   Nicotine dependence   1. Acute on chronic systolic congestive heart failure, clinically improved after initial diuresis, known moderate reduced left ventricular function, with LVEF of 35% by 2D echocardiogram 02/27/2020. BNP downtrending (758->555) 2.Borderline elevated high-sensitivity troponin(59, 78), in the absence of chest pain, in the setting of acute on chronic systolic congestive heart failure, likely demand supply ischemia( I21.A1 ) 3.Coronary artery disease, history of non-STEMI and multiple coronary stents 03/20/2006, status post CABG x2 with LIMA to LAD and SVG to OM on 07/08/2017, with recent [place] 07/17/2020 which did not reveal evidence for ischemia. 4.Ischemic cardiomyopathy, with mild reduced left ventricular function, status post  ICD 5. COPD and ongoing tobacco abuse 6. Right lower extremity arterial embolism, on Eliquis, followed by Dr. Lucky Cowboy  Recommendations:  1. Agree with current therapy 2. Counseled patient about smoking cessation 3. Continue diuresis 4. Continue Plavix 5. COPD management per hospitalist 6. Continue metoprolol tartrate and losartan 7. Defer cardiac catheterization   Sharolyn Douglas 07/24/2020 8:47 AM

## 2020-07-24 NOTE — Care Management Important Message (Signed)
Important Message  Patient Details  Name: Christine Obrien MRN: [id number] Date of Birth: February 21, 1941   Medicare Important Message Given:  Yes     Dannette Barbara 07/24/2020, 11:17 AM

## 2020-07-24 NOTE — Progress Notes (Signed)
Occupational Therapy Treatment Patient Details Name: Christine Obrien MRN: [id number] DOB: 1940/10/26 Today's Date: 07/24/2020    History of present illness Christine Obrien is a 80 y.o. female with medical history significant for chronic systolic heart failure, last known LVEF of 35%, depression, COPD, diabetes mellitus complications of stage III chronic kidney disease, GERD, hypertension, hypothyroidism, status post recent left patella fracture repair who presents to the emergency room for evaluation of exertional shortness of breath which she has had for 2 days as well as worsening knee pain.   OT comments  Pt seen for OT tx this date. Pt in bed, endorses feeling depressed and anxious. OT utilized active listening and therapeutic use of self to provide emotional support for pt. Pt endorsed feeling depressed and when asked if she had plans or having thoughts of self harm, pt stated, "I've always had thoughts but I'm too chicken to do anything." RN notified promptly. May benefit from psych consult to address. Pt also endorsed having questions about whether her meds were making her anxiety/depression worse. RN aware. Pt performed bed mobility with modified independence, using hook method to swing BLE over the edge of the bed with cue to utilize. Pt performed STS transfer with RW and supervision, to get to recliner. Pt instructed in ECS and PLB to support progression of previous education and support recall and carryover. Pt verbalized understanding. VSS throughout. Pt continues to benefit from skilled OT services. Continue to recommend HHOT at discharge.    Follow Up Recommendations  Home health OT;Other (comment) (supervision for bathing, LB dressing, ADL mobility)    Equipment Recommendations  None recommended by OT    Recommendations for Other Services      Precautions / Restrictions Precautions Precautions: Fall Required Braces or Orthoses: Knee Immobilizer - Left Knee Immobilizer - Left: On at all  times Restrictions Weight Bearing Restrictions: Yes LLE Weight Bearing: Weight bearing as tolerated       Mobility Bed Mobility Overal bed mobility: Needs Assistance Bed Mobility: Supine to Sit     Supine to sit: Modified independent (Device/Increase time)     General bed mobility comments: pt hooks LLE with RLE and performs without difficulty    Transfers Overall transfer level: Needs assistance Equipment used: Rolling walker (2 wheeled) Transfers: Sit to/from Stand Sit to Stand: Supervision         General transfer comment: no physical assistance required    Balance Overall balance assessment: Needs assistance Sitting-balance support: No upper extremity supported;Feet supported Sitting balance-Leahy Scale: Good Sitting balance - Comments: no balance deficits noted   Standing balance support: Bilateral upper extremity supported;During functional activity Standing balance-Leahy Scale: Good Standing balance comment: no LOB or unsteadiness during all standing activity                           ADL either performed or assessed with clinical judgement   ADL                                               Vision       Perception     Praxis      Cognition Arousal/Alertness: Awake/alert Behavior During Therapy: WFL for tasks assessed/performed Overall Cognitive Status: Within Functional Limits for tasks assessed  General Comments: pt endorsed feeling depressed and when asked if she had plans or having thoughts of self harm, pt stated, "I've always had thoughts but I'm too chicken to do anything." RN notified. May benefit from psych consult to address. Pt endorsed having questions about whether her meds were making her anxiety/depression worse. RN aware        Exercises Other Exercises Other Exercises: Pt instructed in breathing exercises to support breathe recovery after exertion and  when pt is feeling winded, educated in use of pulse oximeter to monitor O2   Shoulder Instructions       General Comments      Pertinent Vitals/ Pain       Pain Assessment: 0-10 Pain Score: 4  Pain Location: L knee Pain Descriptors / Indicators: Sore;Grimacing;Guarding Pain Intervention(s): Limited activity within patient's tolerance;Monitored during session;Repositioned;Patient requesting pain meds-RN notified  Home Living                                          Prior Functioning/Environment              Frequency  Min 2X/week        Progress Toward Goals  OT Goals(current goals can now be found in the care plan section)  Progress towards OT goals: Progressing toward goals  Acute Rehab OT Goals Patient Stated Goal: to return home OT Goal Formulation: With patient Time For Goal Achievement: 08/06/20 Potential to Achieve Goals: Good  Plan Discharge plan remains appropriate;Frequency remains appropriate    Co-evaluation                 AM-PAC OT "6 Clicks" Daily Activity     Outcome Measure   Help from another person eating meals?: None Help from another person taking care of personal grooming?: None Help from another person toileting, which includes using toliet, bedpan, or urinal?: A Little Help from another person bathing (including washing, rinsing, drying)?: A Little Help from another person to put on and taking off regular upper body clothing?: None Help from another person to put on and taking off regular lower body clothing?: A Little 6 Click Score: 21    End of Session    OT Visit Diagnosis: Other abnormalities of gait and mobility (R26.89);History of falling (Z91.81);Pain Pain - Right/Left: Left Pain - part of body: Knee   Activity Tolerance Patient tolerated treatment well   Patient Left Other (comment);in chair;with chair alarm set;with call bell/phone within reach   Nurse Communication Other (comment)  (depression, question about meds)        Time: 0940-1005 OT Time Calculation (min): 25 min  Charges: OT General Charges $OT Visit: 1 Visit OT Treatments $Self Care/Home Management : 23-37 mins  Hanley Hays, MPH, MS, OTR/L ascom [phone] 07/24/20, 1:30 PM

## 2020-07-24 NOTE — Progress Notes (Signed)
Physical Therapy Treatment Patient Details Name: Christine Obrien MRN: [id number] DOB: 1940/12/17 Today's Date: 07/24/2020    History of Present Illness Christine Obrien is a 80 y.o. female with medical history significant for chronic systolic heart failure, last known LVEF of 35%, depression, COPD, diabetes mellitus complications of stage III chronic kidney disease, GERD, hypertension, hypothyroidism, status post recent left patella fracture repair who presents to the emergency room for evaluation of exertional shortness of breath which she has had for 2 days as well as worsening knee pain.    PT Comments    Pt was sitting in recliner upon arriving. She is A and O x 4 and extremely pleasant throughout. Easily able to stand and ambulate. Pt was on 2 L o2 upon arriving to room however was able to maintain >92% on Rm air during session. RN notified O2 [place] left off. Pt demonstrated safe ability to ambulate 200 ft and was able to correctly perform ascending/descending step without difficulty. Overall pt is progressing well towards all acute goals. Recommend DC home with HHPT to follow. Pt was in recliner post session with call bell in reach and chair alarm in place.    Follow Up Recommendations  Home health PT;Supervision for mobility/OOB     Equipment Recommendations  None recommended by PT       Precautions / Restrictions Precautions Precautions: Fall Restrictions Weight Bearing Restrictions: Yes LLE Weight Bearing: Weight bearing as tolerated    Mobility  Bed Mobility               General bed mobility comments: pt was in recliner pre/post session    Transfers Overall transfer level: Needs assistance Equipment used: Rolling walker (2 wheeled) Transfers: Sit to/from Stand Sit to Stand: Supervision         General transfer comment: no physical assistance required  Ambulation/Gait Ambulation/Gait assistance: Supervision Gait Distance (Feet): 200 Feet Assistive device: Rolling  walker (2 wheeled) Gait Pattern/deviations: Step-to pattern Gait velocity: decreased   General Gait Details: pt was easily able to ambulate 200 ft with RW without LOB or difficulty.   Stairs Stairs: Yes Stairs assistance: Min guard Stair Management: No rails;Forwards;With walker Number of Stairs: 1 General stair comments: pt was able to safely ascend/descend 1 step with RW       Balance Overall balance assessment: Needs assistance Sitting-balance support: No upper extremity supported;Feet supported Sitting balance-Leahy Scale: Good Sitting balance - Comments: no balance deficits noted   Standing balance support: Bilateral upper extremity supported;During functional activity Standing balance-Leahy Scale: Good Standing balance comment: no LOB or unsteadiness during all standing activity       Cognition Arousal/Alertness: Awake/alert Behavior During Therapy: WFL for tasks assessed/performed Overall Cognitive Status: Within Functional Limits for tasks assessed      General Comments: Pt was A and O x 4             Pertinent Vitals/Pain Pain Assessment: No/denies pain Pain Score: 0-No pain Pain Intervention(s): Monitored during session;Limited activity within patient's tolerance           PT Goals (current goals can now be found in the care plan section) Acute Rehab PT Goals Patient Stated Goal: to return home Progress towards PT goals: Progressing toward goals    Frequency    Min 2X/week      PT Plan Current plan remains appropriate       AM-PAC PT "6 Clicks" Mobility   Outcome Measure  Help needed turning from your back  to your side while in a flat bed without using bedrails?: A Little Help needed moving from lying on your back to sitting on the side of a flat bed without using bedrails?: A Little Help needed moving to and from a bed to a chair (including a wheelchair)?: A Little Help needed standing up from a chair using your arms (e.g., wheelchair  or bedside chair)?: A Little Help needed to walk in hospital room?: A Little Help needed climbing 3-5 steps with a railing? : A Little 6 Click Score: 18    End of Session Equipment Utilized During Treatment: Gait belt;Left knee immobilizer Activity Tolerance: Patient tolerated treatment well Patient left: in chair;with call bell/phone within reach;with chair alarm set Nurse Communication: Mobility status PT Visit Diagnosis: History of falling (Z91.81);Pain;Muscle weakness (generalized) (M62.81) Pain - Right/Left: Left Pain - part of body: Knee     Time: 1133-1204 PT Time Calculation (min) (ACUTE ONLY): 31 min  Charges:  $Gait Training: 23-37 mins                     Julaine Fusi PTA 07/24/20, 12:40 PM

## 2020-07-25 DIAGNOSIS — I5021 Acute systolic (congestive) heart failure: Secondary | ICD-10-CM | POA: Diagnosis not present

## 2020-07-25 LAB — BASIC METABOLIC PANEL
Anion gap: 10 (ref 5–15)
BUN: 18 mg/dL (ref 8–23)
CO2: 28 mmol/L (ref 22–32)
Calcium: 9.4 mg/dL (ref 8.9–10.3)
Chloride: 100 mmol/L (ref 98–111)
Creatinine, Ser: 0.85 mg/dL (ref 0.44–1.00)
GFR, Estimated: >60 mL/min (ref >60)
Glucose, Bld: 167 mg/dL — ABNORMAL HIGH (ref 70–99)
Potassium: 3.7 mmol/L (ref 3.5–5.1)
Sodium: 138 mmol/L (ref 135–145)

## 2020-07-25 MED ORDER — CLOPIDOGREL BISULFATE 75 MG PO TABS: 75.0000 mg | ORAL_TABLET | Freq: Every day | ORAL | 0 refills | Status: DC

## 2020-07-25 MED ORDER — FUROSEMIDE 20 MG PO TABS: 20.0000 mg | ORAL_TABLET | Freq: Once | ORAL | Status: DC

## 2020-07-25 MED ORDER — METOPROLOL SUCCINATE ER 25 MG PO TB24
25.0000 mg | ORAL_TABLET | Freq: Every day | ORAL | Status: DC
  Administered 2020-07-25: 25 mg via ORAL
  Filled 2020-07-25: qty 1

## 2020-07-25 MED ORDER — FUROSEMIDE 40 MG PO TABS: 40.0000 mg | ORAL_TABLET | Freq: Once | ORAL | Status: DC

## 2020-07-25 MED ORDER — FUROSEMIDE 20 MG PO TABS: 20.0000 mg | ORAL_TABLET | Freq: Every day | ORAL | 0 refills | Status: DC

## 2020-07-25 MED ORDER — LEVOTHYROXINE SODIUM 137 MCG PO TABS: 137.0000 ug | ORAL_TABLET | Freq: Every day | ORAL | 3 refills | Status: AC

## 2020-07-25 NOTE — Progress Notes (Signed)
Physical Therapy Treatment Patient Details Name: Christine Obrien MRN: [id number] DOB: 05/11/1940 Today's Date: 07/25/2020    History of Present Illness Christine Obrien is a 80 y.o. female with medical history significant for chronic systolic heart failure, last known LVEF of 35%, depression, COPD, diabetes mellitus complications of stage III chronic kidney disease, GERD, hypertension, hypothyroidism, status post recent left patella fracture repair who presents to the emergency room for evaluation of exertional shortness of breath which she has had for 2 days as well as worsening knee pain.    PT Comments    Pt was sitting in recliner upon arriving. She agrees to PT session and is cooperative throughout. Easily able to stand and ambulate 200 ft with RW. HR was stable and o2 >94% on rm air. Pt will greatly benefit from HHPT at DC to address deficits with balance and improve safety with ADLs. Pt was issued gait belt for home use. She states she has all other equipment needs met.    Follow Up Recommendations  Home health PT;Supervision for mobility/OOB     Equipment Recommendations  None recommended by PT (pt reports having equipment needs met. gait belt issued due to some balance deficits.)       Precautions / Restrictions Precautions Precautions: Fall Required Braces or Orthoses: Knee Immobilizer - Left Knee Immobilizer - Left: On at all times Restrictions Weight Bearing Restrictions: Yes LLE Weight Bearing: Weight bearing as tolerated    Mobility  Bed Mobility Overal bed mobility: Needs Assistance Bed Mobility: Supine to Sit     Supine to sit: Modified independent (Device/Increase time) Sit to supine: Modified independent (Device/Increase time)        Transfers Overall transfer level: Needs assistance Equipment used: Rolling walker (2 wheeled) Transfers: Sit to/from Stand Sit to Stand: Supervision            Ambulation/Gait Ambulation/Gait assistance: Supervision Gait  Distance (Feet): 200 Feet Assistive device: Rolling walker (2 wheeled) Gait Pattern/deviations: Step-to pattern Gait velocity: decreased   General Gait Details: pt was easily able to ambulate 200 ft with RW without LOB or difficulty.       Balance Overall balance assessment: Needs assistance Sitting-balance support: No upper extremity supported;Feet supported Sitting balance-Leahy Scale: Good Sitting balance - Comments: no balance deficits noted   Standing balance support: Bilateral upper extremity supported;During functional activity Standing balance-Leahy Scale: Good Standing balance comment: no LOB or unsteadiness during all standing activity       Cognition Arousal/Alertness: Awake/alert Behavior During Therapy: WFL for tasks assessed/performed Overall Cognitive Status: Within Functional Limits for tasks assessed      General Comments: Pt is A and O x 4 and pleasnat and motivated             Pertinent Vitals/Pain Pain Assessment: No/denies pain Pain Score: 0-No pain Pain Location: L knee Pain Descriptors / Indicators: Sore;Grimacing;Guarding Pain Intervention(s): Limited activity within patient's tolerance;Monitored during session;Premedicated before session;Repositioned           PT Goals (current goals can now be found in the care plan section) Acute Rehab PT Goals Patient Stated Goal: to return home Progress towards PT goals: Progressing toward goals    Frequency    Min 2X/week      PT Plan Current plan remains appropriate       AM-PAC PT "6 Clicks" Mobility   Outcome Measure  Help needed turning from your back to your side while in a flat bed without using bedrails?: A Little Help   needed moving from lying on your back to sitting on the side of a flat bed without using bedrails?: A Little Help needed moving to and from a bed to a chair (including a wheelchair)?: A Little Help needed standing up from a chair using your arms (e.g., wheelchair or  bedside chair)?: A Little Help needed to walk in hospital room?: A Little Help needed climbing 3-5 steps with a railing? : A Little 6 Click Score: 18    End of Session Equipment Utilized During Treatment: Gait belt;Left knee immobilizer Activity Tolerance: Patient tolerated treatment well Patient left: in chair;with call bell/phone within reach;with chair alarm set Nurse Communication: Mobility status PT Visit Diagnosis: History of falling (Z91.81);Pain;Muscle weakness (generalized) (M62.81) Pain - Right/Left: Left Pain - part of body: Knee     Time: 1005-1030 PT Time Calculation (min) (ACUTE ONLY): 25 min  Charges:  $Gait Training: 8-22 mins $Therapeutic Activity: 8-22 mins                     Julaine Fusi PTA 07/25/20, 11:08 AM

## 2020-07-25 NOTE — Progress Notes (Signed)
Patient on Room air and tolerating well.  No shortness of breath noted.

## 2020-07-25 NOTE — Discharge Summary (Signed)
[facility] NAME: Christine Obrien    MR#:  [id number]  DATE OF BIRTH:  02/20/1941  DATE OF ADMISSION:  07/21/2020 ADMITTING PHYSICIAN: Collier Bullock, MD  DATE OF DISCHARGE: 07/25/2020  PRIMARY CARE PHYSICIAN: [name], MD    ADMISSION DIAGNOSIS:  Acute CHF (congestive heart failure) ([place]) [I50.9] Dyspnea, unspecified type [R06.00] Acute on chronic congestive heart failure, unspecified heart failure type ([place]) [I50.9]  DISCHARGE DIAGNOSIS:  Acute on Chronic systolic CHF  SECONDARY DIAGNOSIS:   Past Medical History:  Diagnosis Date  . Anxiety   . Asthma   . Atherosclerotic peripheral vascular disease with intermittent claudication ([place])   . Cancer (HCC)    cervical  . Carotid artery occlusion   . CHF (congestive heart failure), NYHA class III ([place])   . CKD (chronic kidney disease), stage III ([place])   . COPD (chronic obstructive pulmonary disease) ([place])   . Coronary artery disease   . Current use of long term anticoagulation   . Depression   . Diabetes mellitus without complication ([place])   . GERD (gastroesophageal reflux disease)   . Gout   . HLD (hyperlipidemia)   . Hx of CABG   . Hypertension   . Hypothyroidism   . Kidney stones   . LBBB (left bundle branch block)   . Myocardial infarction ([place])   . Subclavian steal syndrome     HOSPITAL COURSE:   Christine Obrien a 80 y.o.femalewith medical history significant forchronic systolic heart failure, last known LVEF of 35%, depression, COPD, diabetes mellitus complications of stage III chronic kidney disease, GERD, hypertension, hypothyroidism, status post recent left patella fracture repair who presents to the emergency room for evaluation of exertional shortness of breath which she has had for 2 days as well as worsening knee pain.  Acute on chronic systolic CHF -- echo shows EF of 35% in Nov 2021.  --Clinically improving and sats more than 92% on room air. --BNP  trending down --Continue iv lasix -- change to oral Lasix 20 mg from tomorrow --Continue ARB and beta blocker --Cardiac cath deferred -- appreciate [facility] cardiology input. Recommends oral Lasix, Plavix for CAD  HTN: stable, continue bb , arb  DM2: with mild hyperglycemia --bg stable --Continue riss  Nicotine dependence: smoking cessation counseling   Hx of CAD: s/p CABG.  --Continue on BB, zetia, aspirin  --plavix added by cardiology  Left patella fracture: s/p repair. Left knee remains immobilized.pt will f/u Dr Harlow Mares on her appt  Hypothyroidism: continue on home dose of levothyroxine   PAF & DVT: continue on eliquis   Elevated troponin: likely secondary to demand ischemia.       DVT prophylaxis: eliquis  Code Status: full  Family Communication:daughter carla on the phone Disposition Plan: likely d/c back home  Level of care: Progressive Cardiac   Status is: Inpatient   Dispo: The patient is from: Home  Anticipated d/c is to: Home with HHPT/RN  Patient currently is  medically improving and best optimized  to d/c.              Difficult to place patient No   CONSULTS OBTAINED:  Treatment Team:  Isaias Cowman, MD  DRUG ALLERGIES:   Allergies  Allergen Reactions  . Ace Inhibitors     Other reaction(s): Cough  . Atorvastatin     Other reaction(s): Muscle Pain  . Statins     Other reaction(s): Muscle Pain Muscle pain  .  Sulfa Antibiotics     Stomach pains    DISCHARGE MEDICATIONS:   Allergies as of 07/25/2020      Reactions   Ace Inhibitors    Other reaction(s): Cough   Atorvastatin    Other reaction(s): Muscle Pain   Statins    Other reaction(s): Muscle Pain Muscle pain   Sulfa Antibiotics    Stomach pains      Medication List    STOP taking these medications   oxyCODONE 5 MG immediate release tablet Commonly known as: Oxy IR/ROXICODONE     TAKE these medications   apixaban 5 MG  Tabs tablet Commonly known as: ELIQUIS Take 5 mg by mouth 2 (two) times daily.   clopidogrel 75 MG tablet Commonly known as: PLAVIX Take 1 tablet (75 mg total) by mouth daily. Start taking on: July 26, 2020   empagliflozin 10 MG Tabs tablet Commonly known as: JARDIANCE Take 10 mg by mouth daily.   ezetimibe 10 MG tablet Commonly known as: ZETIA Take by mouth.   ferrous sulfate 325 (65 FE) MG tablet Take 325 mg by mouth.   fluticasone 50 MCG/ACT nasal spray Commonly known as: FLONASE Place 2 sprays into the nose daily.   Fluticasone-Salmeterol 250-50 MCG/DOSE Aepb Commonly known as: ADVAIR Inhale 1 puff into the lungs daily.   furosemide 20 MG tablet Commonly known as: LASIX Take 1 tablet (20 mg total) by mouth daily.   glipiZIDE 5 MG tablet Commonly known as: GLUCOTROL Take by mouth daily before breakfast.   levothyroxine 137 MCG tablet Commonly known as: SYNTHROID Take 1 tablet (137 mcg total) by mouth daily.   loratadine 10 MG tablet Commonly known as: CLARITIN Take 10 mg by mouth daily.   losartan 25 MG tablet Commonly known as: COZAAR Take by mouth.   metFORMIN 500 MG tablet Commonly known as: GLUCOPHAGE Take 500 mg by mouth 2 (two) times daily with a meal.   metoprolol tartrate 25 MG tablet Commonly known as: LOPRESSOR Take 1 tablet (25 mg total) by mouth 2 (two) times daily.   multivitamin capsule Take 1 capsule by mouth daily.   omega-3 acid ethyl esters 1 g capsule Commonly known as: LOVAZA Take 2 g by mouth 2 (two) times daily.   omeprazole 20 MG capsule Commonly known as: PRILOSEC Take 20 mg by mouth 2 (two) times daily before a meal.   oxyCODONE-acetaminophen 5-325 MG tablet Commonly known as: Percocet Take 1 tablet by mouth every 4 (four) hours as needed for severe pain.   TYLENOL PO Take 500 mg by mouth.   venlafaxine 75 MG tablet Commonly known as: EFFEXOR Take 75 mg by mouth 2 (two) times daily with a meal.             Discharge Care Instructions  (From admission, onward)         Start     Ordered   07/25/20 0000  Discharge wound care:       Comments: Keep dry with gauze and bandage   07/25/20 1037          If you experience worsening of your admission symptoms, develop shortness of breath, life threatening emergency, suicidal or homicidal thoughts you must seek medical attention immediately by calling 911 or calling your MD immediately  if symptoms less severe.  You Must read complete instructions/literature along with all the possible adverse reactions/side effects for all the Medicines you take and that have been prescribed to you. Take any new Medicines after you  have completely understood and accept all the possible adverse reactions/side effects.   Please note  You were cared for by a hospitalist during your hospital stay. If you have any questions about your discharge medications or the care you received while you were in the hospital after you are discharged, you can call the unit and asked to speak with the hospitalist on call if the hospitalist that took care of you is not available. Once you are discharged, your primary care physician will handle any further medical issues. Please note that NO REFILLS for any discharge medications will be authorized once you are discharged, as it is imperative that you return to your primary care physician (or establish a relationship with a primary care physician if you do not have one) for your aftercare needs so that they can reassess your need for medications and monitor your lab values. Today   SUBJECTIVE  Doing well Out int he chair No sob or cp Ate good BF   VITAL SIGNS:  Blood pressure 118/77, pulse 80, temperature 98.1 F (36.7 C), temperature source Oral, resp. rate 19, height 5\' 3"  (1.6 m), weight 65.9 kg, SpO2 94 %.  I/O:    Intake/Output Summary (Last 24 hours) at 07/25/2020 1038 Last data filed at 07/25/2020 0935 Gross per 24 hour   Intake 1060 ml  Output 1100 ml  Net -40 ml    PHYSICAL EXAMINATION:  GENERAL:  80 y.o.-year-old patient lying in the bed with no acute distress.  LUNGS: decreased breath sounds bilaterally, no wheezing, rales,rhonchi or crepitation. No use of accessory muscles of respiration.  CARDIOVASCULAR: S1, S2 normal. No murmurs, rubs, or gallops.  ABDOMEN: Soft, non-tender, non-distended. Bowel sounds present. No organomegaly or mass.  EXTREMITIES: No pedal edema, cyanosis, or clubbing.  NEUROLOGIC: grossly non-focal PSYCHIATRIC: patient is alert and oriented x 3.  SKIN: No obvious rash, lesion, or ulcer.   DATA REVIEW:   CBC  Recent Labs  Lab 07/24/20 0412  WBC 6.1  HGB 10.6*  HCT 32.3*  PLT 216    Chemistries  Recent Labs  Lab 07/21/20 0818 07/22/20 0355 07/25/20 0406  NA 135   < > 138  K 3.3*   < > 3.7  CL 102   < > 100  CO2 24   < > 28  GLUCOSE 187*   < > 167*  BUN 11   < > 18  CREATININE 0.88   < > 0.85  CALCIUM 9.0   < > 9.4  AST 25  --   --   ALT 18  --   --   ALKPHOS 47  --   --   BILITOT 1.0  --   --    < > = values in this interval not displayed.    Microbiology Results   Recent Results (from the past 240 hour(s))  Resp Panel by RT-PCR (Flu A&B, Covid) Nasopharyngeal Swab     Status: None   Collection Time: 07/21/20  8:18 AM   Specimen: Nasopharyngeal Swab; Nasopharyngeal(NP) swabs in vial transport medium  Result Value Ref Range Status   SARS Coronavirus 2 by RT PCR NEGATIVE NEGATIVE Final    Comment: (NOTE) SARS-CoV-2 target nucleic acids are NOT DETECTED.  The SARS-CoV-2 RNA is generally detectable in upper respiratory specimens during the acute phase of infection. The lowest concentration of SARS-CoV-2 viral copies this assay can detect is 138 copies/mL. A negative result does not preclude SARS-Cov-2 infection and should not be used as the  sole basis for treatment or other patient management decisions. A negative result may occur with  improper  specimen collection/handling, submission of specimen other than nasopharyngeal swab, presence of viral mutation(s) within the areas targeted by this assay, and inadequate number of viral copies(<138 copies/mL). A negative result must be combined with clinical observations, patient history, and epidemiological information. The expected result is Negative.  Fact Sheet for Patients:  EntrepreneurPulse.com.au  Fact Sheet for Healthcare Providers:  IncredibleEmployment.be  This test is no t yet approved or cleared by the [place] FDA and  has been authorized for detection and/or diagnosis of SARS-CoV-2 by FDA under an Emergency Use Authorization (EUA). This EUA will remain  in effect (meaning this test can be used) for the duration of the COVID-19 declaration under Section 564(b)(1) of the Act, 21 U.S.C.section 360bbb-3(b)(1), unless the authorization is terminated  or revoked sooner.       Influenza A by PCR NEGATIVE NEGATIVE Final   Influenza B by PCR NEGATIVE NEGATIVE Final    Comment: (NOTE) The Xpert Xpress SARS-CoV-2/FLU/RSV plus assay is intended as an aid in the diagnosis of influenza from Nasopharyngeal swab specimens and should not be used as a sole basis for treatment. Nasal washings and aspirates are unacceptable for Xpert Xpress SARS-CoV-2/FLU/RSV testing.  Fact Sheet for Patients: EntrepreneurPulse.com.au  Fact Sheet for Healthcare Providers: IncredibleEmployment.be  This test is not yet approved or cleared by the [place] FDA and has been authorized for detection and/or diagnosis of SARS-CoV-2 by FDA under an Emergency Use Authorization (EUA). This EUA will remain in effect (meaning this test can be used) for the duration of the COVID-19 declaration under Section 564(b)(1) of the Act, 21 U.S.C. section 360bbb-3(b)(1), unless the authorization is terminated or revoked.  Performed at  [facility], [address] [place], [place] [address]     RADIOLOGY:  No results found.   CODE STATUS:     Code Status Orders  (From admission, onward)         Start     Ordered   07/21/20 1134  Full code  Continuous        07/21/20 1136        Code Status History    Date Active Date Inactive Code Status Order ID Comments User Context   01/11/2018 1842 01/11/2018 2327 Full Code [id number]  Algernon Huxley, MD Inpatient   07/03/2017 0140 07/05/2017 0021 Full Code [id number]  Max Sane, MD ED   Advance Care Planning Activity       TOTAL TIME TAKING CARE OF THIS PATIENT: *40* minutes.    Fritzi Mandes M.D  [facility]    CC: Primary care physician; [name], MD

## 2020-07-25 NOTE — TOC Initial Note (Signed)
Transition of Care [facility]) - Initial/Assessment Note    Patient Details  Name: Christine Obrien MRN: [id number] Date of Birth: Sep 14, 1940  Transition of Care [facility]) CM/SW Contact:    Beverly Sessions, RN Phone Number: 07/25/2020, 10:45 AM  Clinical Narrative:                   Patient admitted from home with CHF Patient lives at home with Elias-Fela Solis PCP [place]  Patient to discharge home today PT has evaluated and recommends home health PT  Patient active with [facility] Resumption orders for RN and PT.   [place] with [facility] notified   Expected Discharge Plan: Home w Home Health Services Barriers to Discharge: No Barriers Identified   Patient Goals and CMS Choice        Expected Discharge Plan and Services Expected Discharge Plan: [place]         Expected Discharge Date: 07/25/20                         HH Arranged: PT,RN HH Agency: [facility] Date [place] Agency Contacted: 07/25/20   Representative spoke with at [place]: [place] Arrangements/Services   Lives with:: Adult Children Patient language and need for interpreter reviewed:: Yes        Need for Family Participation in Patient Care: Yes (Comment) Care giver support system in place?: Yes (comment) Current home services: DME,Home PT,Home RN Criminal Activity/Legal Involvement Pertinent to Current Situation/Hospitalization: No - Comment as needed  Activities of Daily Living Home Assistive Devices/Equipment: Eyeglasses,Dentures (specify type) ADL Screening (condition at time of admission) Patient's cognitive ability adequate to safely complete daily activities?: Yes Is the patient deaf or have difficulty hearing?: Yes Does the patient have difficulty seeing, even when wearing glasses/contacts?: No Does the patient have difficulty concentrating, remembering, or making decisions?: Yes Patient able to express need for assistance with ADLs?:  Yes Does the patient have difficulty dressing or bathing?: Yes Independently performs ADLs?: No Communication: Independent Dressing (OT): Needs assistance Is this a change from baseline?: Pre-admission baseline Grooming: Needs assistance Is this a change from baseline?: Pre-admission baseline Feeding: Independent Bathing: Needs assistance Is this a change from baseline?: Pre-admission baseline Toileting: Needs assistance Is this a change from baseline?: Pre-admission baseline In/Out Bed: Needs assistance Is this a change from baseline?: Pre-admission baseline Walks in Home: Needs assistance Is this a change from baseline?: Pre-admission baseline Does the patient have difficulty walking or climbing stairs?: Yes Weakness of Legs: Left Weakness of Arms/Hands: None  Permission Sought/Granted                  Emotional Assessment              Admission diagnosis:  Acute CHF (congestive heart failure) (HCC) [I50.9] Dyspnea, unspecified type [R06.00] Acute on chronic congestive heart failure, unspecified heart failure type ([place]) [I50.9] Patient Active Problem List   Diagnosis Date Noted  . Acute CHF (congestive heart failure) ([place]) 07/21/2020  . Acute systolic CHF (congestive heart failure) ([place]) 07/21/2020  . CKD stage 3 due to type 2 diabetes mellitus ([place]) 07/21/2020  . Nicotine dependence 07/21/2020  . ICD (implantable cardioverter-defibrillator) in place 11/02/2019  . Chronic systolic CHF (congestive heart failure), NYHA class 3 ([place]) 02/14/2019  . LBBB (left bundle branch block) 12/02/2018  . 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 Subclavian steal syndrome 01/12/2018  .  Atherosclerotic peripheral vascular disease with intermittent claudication ([place]) 12/30/2017  . Pain in both upper extremities 12/30/2017  . Hyperlipidemia 12/30/2017  . Coronary artery disease involving native heart 12/30/2017  . Postoperative atrial fibrillation ([place])  07/09/2017  . S/P CABG x 2 07/09/2017  . Stenosis of left subclavian artery ([place]) 07/07/2017  . History of non-ST elevation myocardial infarction (NSTEMI) 07/06/2017  . Presence of stent in coronary artery 07/06/2017  . Chest pain 07/03/2017  . NSTEMI (non-ST elevated myocardial infarction) ([place]) 07/03/2017  . Impingement syndrome of right shoulder region 03/06/2017  . Diabetes ([place]) 03/25/2016  . Carotid stenosis 03/25/2016  . Anxiety, generalized 08/24/2015  . Gastroesophageal reflux disease without esophagitis 07/13/2015  . DDD (degenerative disc disease), cervical 04/06/2015  . Cervical radiculitis 04/06/2015  . Type 2 diabetes mellitus without complications ([place]) 45/06/8880  . Carotid bruit 09/15/2011  . Symptoms involving cardiovascular system 09/15/2011  . Essential hypertension 09/11/2011  . Dysthymic disorder 09/11/2011  . Hypothyroidism 09/11/2011  . Major depressive disorder, recurrent episode, moderate ([place]) 09/11/2011  . Dyslipidemia 03/18/2011   PCP:  [name], MD Pharmacy:   [facility] [place], [place] - [address] [place] [address] Phone: [phone] Fax: [phone]  [id number] [address], [address] [place] [address] Phone: [phone] Fax: [phone]     Social Determinants of Health (SDOH) Interventions    Readmission Risk Interventions No flowsheet data found.

## 2020-07-25 NOTE — Discharge Instructions (Addendum)
Pt to keep April 12th appt with Dr Harlow Mares

## 2020-07-25 NOTE — Progress Notes (Signed)
[facility] Cardiology    SUBJECTIVE: The patient reports feeling back to her baseline. She denies worsening breathing, denies chest pain or palpitations.    Vitals:   07/24/20 1801 07/24/20 1926 07/25/20 0745 07/25/20 0800  BP:  107/82 118/77   Pulse: 80 77 80   Resp:  18 19   Temp:  97.6 F (36.4 C) 98.1 F (36.7 C)   TempSrc:   Oral   SpO2: 93% 98% 98% 94%  Weight:   65.9 kg   Height:         Intake/Output Summary (Last 24 hours) at 07/25/2020 0834 Last data filed at 07/24/2020 1830 Gross per 24 hour  Intake 820 ml  Output 950 ml  Net -130 ml      PHYSICAL EXAM   General: Well developed, well nourished, in no acute distress HEENT:  Normocephalic and atramatic Neck:   No JVD.  Lungs: normal effort of breathing on RA Heart: HRRR . Normal S1 and S2 without gallops or murmurs.  Abdomen: nondistended  Msk:  Back normal, normal gait. Normal strength and tone for age. Extremities: No clubbing, cyanosis or edema.   Neuro: Alert and oriented X 3. Psych:  Good affect, responds appropriately   LABS: Basic Metabolic Panel: Recent Labs    07/24/20 0412 07/25/20 0406  NA 138 138  K 3.4* 3.7  CL 101 100  CO2 30 28  GLUCOSE 162* 167*  BUN 20 18  CREATININE 0.88 0.85  CALCIUM 9.4 9.4   Liver Function Tests: No results for input(s): AST, ALT, ALKPHOS, BILITOT, PROT, ALBUMIN in the last 72 hours. No results for input(s): LIPASE, AMYLASE in the last 72 hours. CBC: Recent Labs    07/23/20 0347 07/24/20 0412  WBC 6.8 6.1  HGB 10.4* 10.6*  HCT 32.3* 32.3*  MCV 98.8 96.7  PLT 203 216   Cardiac Enzymes: No results for input(s): CKTOTAL, CKMB, CKMBINDEX, TROPONINI in the last 72 hours. BNP: Invalid input(s): POCBNP D-Dimer: No results for input(s): DDIMER in the last 72 hours. Hemoglobin A1C: No results for input(s): HGBA1C in the last 72 hours. Fasting Lipid Panel: No results for input(s): CHOL, HDL, LDLCALC, TRIG, CHOLHDL, LDLDIRECT in the last 72 hours. Thyroid  Function Tests: No results for input(s): TSH, T4TOTAL, T3FREE, THYROIDAB in the last 72 hours.  Invalid input(s): FREET3 Anemia Panel: No results for input(s): VITAMINB12, FOLATE, FERRITIN, TIBC, IRON, RETICCTPCT in the last 72 hours.  No results found.   TELEMETRY: paced rhythm  ASSESSMENT AND PLAN:  Principal Problem:   Acute systolic CHF (congestive heart failure) (HCC) Active Problems:   Coronary artery disease involving native heart   Anxiety, generalized   Essential hypertension   Hypothyroidism   CKD stage 3 due to type 2 diabetes mellitus (HCC)   Nicotine dependence    1.Acute on chronic systolic congestive heart failure, clinically improved after initial diuresis, known moderate reduced left ventricular function, with LVEF of 35% by 2D echocardiogram 02/27/2020. BNP downtrending (758->555) 2.Borderline elevated high-sensitivity troponin(59, 78), in the absence of chest pain, in the setting of acute on chronic systolic congestive heart failure, likely demand supply ischemia( I21.A1 ) 3.Coronary artery disease, history of non-STEMI and multiple coronary stents 03/20/2006, status post CABG x2 with LIMA to LAD and SVG to OM on 07/08/2017, with recent [place] 07/17/2020 which did not reveal evidence for ischemia. 4.Ischemic cardiomyopathy, with mild reduced left ventricular function, status post ICD 5. COPD and ongoing tobacco abuse 6. Right lower extremity arterial embolism, on  Eliquis, followed by Dr. Lucky Cowboy 7. Type II diabetes, on Jardiance  Recommendations: 1. Continue Plavix for CAD 2. Continue Eliquis for history of right lower extremity arterial embolism 3. Continue Zetia and Crestor for CAD 4. Continue metoprolol and losartan 5. COPD management per hospitalist 6. Encouraged patient to stop smoking 7. Consider initiation of Entresto as outpatient (cost-prohibitive?) 8. Continue diuresis; recommend transition to oral today and consider discharge later  today or tomorrow. 9. Follow-up with Dr. Nehemiah Massed in about 1 week (appointment request made.)  Clabe Seal, PA-C 07/25/2020 8:34 AM  Sign off for now; please call with any questions.

## 2020-07-25 NOTE — Progress Notes (Addendum)
D/c telemetry.  Waiting for daughter to complete discharge.   D/c piv.  Went over discharge instructions.  No questions.  Patient to be esorted out of hospital via nursing staff.

## 2020-07-25 NOTE — Progress Notes (Addendum)
   Heart Failure Nurse Navigator Note  HFrEF 30-35%   Met with patient today, and discussed by teach back method signs and symptoms to report, daily weights and what to report.  She needs little reinforcement.  Discussed following up in the heart failure clinic.  Christine Obrien had no further questions.   [name] RN CHFN

## 2020-07-26 ENCOUNTER — Telehealth: Payer: Self-pay

## 2020-08-16 ENCOUNTER — Ambulatory Visit: Payer: Medicare Other | Admitting: Family

## 2020-12-11 ENCOUNTER — Other Ambulatory Visit: Payer: Self-pay | Admitting: Family Medicine

## 2020-12-11 DIAGNOSIS — R911 Solitary pulmonary nodule: Secondary | ICD-10-CM

## 2020-12-12 ENCOUNTER — Ambulatory Visit
Admission: RE | Admit: 2020-12-12 | Discharge: 2020-12-12 | Disposition: A | Discharge status: Home / Self Care | Payer: Medicare Other | Source: Ambulatory Visit | Attending: Family Medicine | Admitting: Family Medicine

## 2020-12-12 ENCOUNTER — Other Ambulatory Visit: Payer: Self-pay

## 2020-12-12 DIAGNOSIS — R911 Solitary pulmonary nodule: Secondary | ICD-10-CM | POA: Insufficient documentation

## 2020-12-12 LAB — POCT I-STAT CREATININE: Creatinine, Ser: 1.2 mg/dL — ABNORMAL HIGH (ref 0.44–1.00)

## 2020-12-12 MED ORDER — IOHEXOL 350 MG/ML SOLN
50.0000 mL | Freq: Once | INTRAVENOUS | Status: AC | PRN
  Administered 2020-12-12: 50 mL via INTRAVENOUS

## 2020-12-13 ENCOUNTER — Other Ambulatory Visit (HOSPITAL_COMMUNITY): Payer: Self-pay | Admitting: Family Medicine

## 2020-12-13 ENCOUNTER — Other Ambulatory Visit: Payer: Self-pay | Admitting: Family Medicine

## 2020-12-13 DIAGNOSIS — R911 Solitary pulmonary nodule: Secondary | ICD-10-CM

## 2020-12-16 ENCOUNTER — Emergency Department: Payer: Medicare Other

## 2020-12-16 ENCOUNTER — Emergency Department
Admission: EM | Admit: 2020-12-16 | Discharge: 2020-12-16 | Disposition: A | Discharge status: Home / Self Care | Payer: Medicare Other | Attending: Emergency Medicine | Admitting: Emergency Medicine

## 2020-12-16 DIAGNOSIS — Z7901 Long term (current) use of anticoagulants: Secondary | ICD-10-CM | POA: Insufficient documentation

## 2020-12-16 DIAGNOSIS — E1122 Type 2 diabetes mellitus with diabetic chronic kidney disease: Secondary | ICD-10-CM | POA: Diagnosis not present

## 2020-12-16 DIAGNOSIS — Z8541 Personal history of malignant neoplasm of cervix uteri: Secondary | ICD-10-CM | POA: Diagnosis not present

## 2020-12-16 DIAGNOSIS — M79605 Pain in left leg: Secondary | ICD-10-CM | POA: Diagnosis present

## 2020-12-16 DIAGNOSIS — I130 Hypertensive heart and chronic kidney disease with heart failure and stage 1 through stage 4 chronic kidney disease, or unspecified chronic kidney disease: Secondary | ICD-10-CM | POA: Insufficient documentation

## 2020-12-16 DIAGNOSIS — E039 Hypothyroidism, unspecified: Secondary | ICD-10-CM | POA: Diagnosis not present

## 2020-12-16 DIAGNOSIS — J449 Chronic obstructive pulmonary disease, unspecified: Secondary | ICD-10-CM | POA: Diagnosis not present

## 2020-12-16 DIAGNOSIS — J45909 Unspecified asthma, uncomplicated: Secondary | ICD-10-CM | POA: Insufficient documentation

## 2020-12-16 DIAGNOSIS — Z7984 Long term (current) use of oral hypoglycemic drugs: Secondary | ICD-10-CM | POA: Diagnosis not present

## 2020-12-16 DIAGNOSIS — F17210 Nicotine dependence, cigarettes, uncomplicated: Secondary | ICD-10-CM | POA: Insufficient documentation

## 2020-12-16 DIAGNOSIS — Z7902 Long term (current) use of antithrombotics/antiplatelets: Secondary | ICD-10-CM | POA: Insufficient documentation

## 2020-12-16 DIAGNOSIS — I2510 Atherosclerotic heart disease of native coronary artery without angina pectoris: Secondary | ICD-10-CM | POA: Insufficient documentation

## 2020-12-16 DIAGNOSIS — Z7951 Long term (current) use of inhaled steroids: Secondary | ICD-10-CM | POA: Diagnosis not present

## 2020-12-16 DIAGNOSIS — N183 Chronic kidney disease, stage 3 unspecified: Secondary | ICD-10-CM | POA: Diagnosis not present

## 2020-12-16 DIAGNOSIS — I509 Heart failure, unspecified: Secondary | ICD-10-CM | POA: Diagnosis not present

## 2020-12-16 DIAGNOSIS — Z79899 Other long term (current) drug therapy: Secondary | ICD-10-CM | POA: Diagnosis not present

## 2020-12-16 DIAGNOSIS — Z794 Long term (current) use of insulin: Secondary | ICD-10-CM | POA: Diagnosis not present

## 2020-12-16 DIAGNOSIS — Z951 Presence of aortocoronary bypass graft: Secondary | ICD-10-CM | POA: Diagnosis not present

## 2020-12-16 LAB — COMPREHENSIVE METABOLIC PANEL
ALT: 23 U/L (ref 0–44)
AST: 22 U/L (ref 15–41)
Albumin: 3.9 g/dL (ref 3.5–5.0)
Alkaline Phosphatase: 56 U/L (ref 38–126)
Anion gap: 8 (ref 5–15)
BUN: 16 mg/dL (ref 8–23)
CO2: 30 mmol/L (ref 22–32)
Calcium: 9.8 mg/dL (ref 8.9–10.3)
Chloride: 100 mmol/L (ref 98–111)
Creatinine, Ser: 0.99 mg/dL (ref 0.44–1.00)
GFR, Estimated: 58 mL/min — ABNORMAL LOW (ref >60)
Glucose, Bld: 88 mg/dL (ref 70–99)
Potassium: 4.1 mmol/L (ref 3.5–5.1)
Sodium: 138 mmol/L (ref 135–145)
Total Bilirubin: 0.6 mg/dL (ref 0.3–1.2)
Total Protein: 7 g/dL (ref 6.5–8.1)

## 2020-12-16 LAB — CBC WITH DIFFERENTIAL/PLATELET
Abs Immature Granulocytes: 0.01 10*3/uL (ref 0.00–0.07)
Basophils Absolute: 0 10*3/uL (ref 0.0–0.1)
Basophils Relative: 0 %
Eosinophils Absolute: 0.3 10*3/uL (ref 0.0–0.5)
Eosinophils Relative: 4 %
HCT: 39.9 % (ref 36.0–46.0)
Hemoglobin: 13.7 g/dL (ref 12.0–15.0)
Immature Granulocytes: 0 %
Lymphocytes Relative: 32 %
Lymphs Abs: 2.2 10*3/uL (ref 0.7–4.0)
MCH: 32.9 pg (ref 26.0–34.0)
MCHC: 34.3 g/dL (ref 30.0–36.0)
MCV: 95.7 fL (ref 80.0–100.0)
Monocytes Absolute: 0.3 10*3/uL (ref 0.1–1.0)
Monocytes Relative: 5 %
Neutro Abs: 4.1 10*3/uL (ref 1.7–7.7)
Neutrophils Relative %: 59 %
Platelets: 169 10*3/uL (ref 150–400)
RBC: 4.17 MIL/uL (ref 3.87–5.11)
RDW: 12.9 % (ref 11.5–15.5)
WBC: 6.9 10*3/uL (ref 4.0–10.5)
nRBC: 0 % (ref 0.0–0.2)

## 2020-12-16 LAB — CK: Total CK: 39 U/L (ref 38–234)

## 2020-12-16 MED ORDER — ONDANSETRON 4 MG PO TBDP: 4.0000 mg | ORAL_TABLET | Freq: Three times a day (TID) | ORAL | 0 refills | Status: AC | PRN

## 2020-12-16 MED ORDER — HYDROCODONE-ACETAMINOPHEN 5-325 MG PO TABS: 1.0000 | ORAL_TABLET | Freq: Four times a day (QID) | ORAL | 0 refills | Status: AC | PRN

## 2020-12-16 MED ORDER — ONDANSETRON 4 MG PO TBDP
4.0000 mg | ORAL_TABLET | Freq: Once | ORAL | Status: AC
  Administered 2020-12-16: 4 mg via ORAL
  Filled 2020-12-16: qty 1

## 2020-12-16 MED ORDER — HYDROCODONE-ACETAMINOPHEN 5-325 MG PO TABS
1.0000 | ORAL_TABLET | Freq: Once | ORAL | Status: AC
  Administered 2020-12-16: 1 via ORAL
  Filled 2020-12-16: qty 1

## 2020-12-16 NOTE — ED Notes (Signed)
Pt NAD, a/ox4. Pt verbalizes understanding of all DC and f/u instructions. All questions answered. Pt wheeled to lobby by family member

## 2020-12-16 NOTE — ED Triage Notes (Signed)
Pt c/o intermittent anterior LLE (shin) spasms since last night.  Pain score 7/10.  Pt report "an episode" x 4 nights ago as well.  No redness, warmth, or swelling noted.  Pt reports "some" calf pain w/ shin spasms.  Denies recent travel or increase ambulation.

## 2020-12-16 NOTE — ED Provider Notes (Signed)
[facility]-EMERGENCY DEPARTMENT  ____________________________________________  Time seen: Approximately 7:17 PM  I have reviewed the triage vital signs and the nursing notes.   HISTORY  Chief Complaint Leg spasms   Historian Patient     HPI Christine Obrien is a 80 y.o. female with a history of peripheral vascular disease with intermittent claudication, presents to the emergency department with spasms of the left calf.  Patient states that she has been ambulatory at home with no prolonged immobilization.  Patient has been compliant with the use of her Eliquis.  She denies pleuritic chest pain or shortness of breath.  No new falls or mechanisms of trauma.  She denies daily smoking.  Patient reports that she needs to follow-up with her vascular physician, Dr. Lucky Cowboy regarding potential carotid enterectomy or carotid stent placement. Dr. Lucky Cowboy reported that patient had recent reassuring lower extremity arterial studies at her appointment in March of this year.  Patient denies chest pain, chest tightness or shortness of breath.  No pain in the upper extremities.  No other alleviating measures have been attempted.  Past Medical History:  Diagnosis Date   Anxiety    Asthma    Atherosclerotic peripheral vascular disease with intermittent claudication (HCC)    Cancer (HCC)    cervical   Carotid artery occlusion    CHF (congestive heart failure), NYHA class III (HCC)    CKD (chronic kidney disease), stage III (HCC)    COPD (chronic obstructive pulmonary disease) (HCC)    Coronary artery disease    Current use of long term anticoagulation    Depression    Diabetes mellitus without complication (HCC)    GERD (gastroesophageal reflux disease)    Gout    HLD (hyperlipidemia)    Hx of CABG    Hypertension    Hypothyroidism    Kidney stones    LBBB (left bundle branch block)    Myocardial infarction ([place])    Subclavian steal syndrome      Immunizations up to date:  Yes.     Past Medical  History:  Diagnosis Date   Anxiety    Asthma    Atherosclerotic peripheral vascular disease with intermittent claudication (HCC)    Cancer (HCC)    cervical   Carotid artery occlusion    CHF (congestive heart failure), NYHA class III (HCC)    CKD (chronic kidney disease), stage III (HCC)    COPD (chronic obstructive pulmonary disease) (HCC)    Coronary artery disease    Current use of long term anticoagulation    Depression    Diabetes mellitus without complication (HCC)    GERD (gastroesophageal reflux disease)    Gout    HLD (hyperlipidemia)    Hx of CABG    Hypertension    Hypothyroidism    Kidney stones    LBBB (left bundle branch block)    Myocardial infarction ([place])    Subclavian steal syndrome     Patient Active Problem List   Diagnosis Date Noted   Acute CHF (congestive heart failure) ([place]) 0000000   Acute systolic CHF (congestive heart failure) ([place]) 07/21/2020   CKD stage 3 due to type 2 diabetes mellitus ([place]) 07/21/2020   Nicotine dependence 07/21/2020   ICD (implantable cardioverter-defibrillator) in place 123XX123   Chronic systolic CHF (congestive heart failure), NYHA class 3 ([place]) 02/14/2019   LBBB (left bundle branch block) 12/02/2018   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[place]) 02/12/2018   Subclavian steal syndrome 01/12/2018   Atherosclerotic  peripheral vascular disease with intermittent claudication ([place]) 12/30/2017   Pain in both upper extremities 12/30/2017   Hyperlipidemia 12/30/2017   Coronary artery disease involving native heart 12/30/2017   Postoperative atrial fibrillation ([place]) 07/09/2017   S/P CABG x 2 07/09/2017   Stenosis of left subclavian artery ([place]) 07/07/2017   History of non-ST elevation myocardial infarction (NSTEMI) 07/06/2017   Presence of stent in coronary artery 07/06/2017   Chest pain 07/03/2017   NSTEMI (non-ST elevated myocardial infarction) ([place]) 07/03/2017   Impingement syndrome of right shoulder region  03/06/2017   Diabetes ([place]) 03/25/2016   Carotid stenosis 03/25/2016   Anxiety, generalized 08/24/2015   Gastroesophageal reflux disease without esophagitis 07/13/2015   DDD (degenerative disc disease), cervical 04/06/2015   Cervical radiculitis 04/06/2015   Type 2 diabetes mellitus without complications ([place]) 123XX123   Carotid bruit 09/15/2011   Symptoms involving cardiovascular system 09/15/2011   Essential hypertension 09/11/2011   Dysthymic disorder 09/11/2011   Hypothyroidism 09/11/2011   Major depressive disorder, recurrent episode, moderate ([place]) 09/11/2011   Dyslipidemia 03/18/2011    Past Surgical History:  Procedure Laterality Date   ABDOMINAL HYSTERECTOMY     partial   CHOLECYSTECTOMY     COLONOSCOPY WITH PROPOFOL N/A 01/11/2020   Procedure: COLONOSCOPY WITH PROPOFOL;  Surgeon: Toledo, Benay Pike, MD;  Location: [facility] ENDOSCOPY;  Service: Gastroenterology;  Laterality: N/A;   CORONARY ANGIOPLASTY     CORONARY ARTERY BYPASS GRAFT     double   EYE SURGERY Bilateral 08/2009   cataracts   HIATAL HERNIA REPAIR  1980s   LEFT HEART CATH AND CORONARY ANGIOGRAPHY N/A 07/03/2017   Procedure: LEFT HEART CATH AND CORONARY ANGIOGRAPHY;  Surgeon: Corey Skains, MD;  Location: [facility];  Service: Cardiovascular;  Laterality: N/A;   LOWER EXTREMITY ANGIOGRAPHY Left 01/11/2018   Procedure: LOWER EXTREMITY ANGIOGRAPHY;  Surgeon: Algernon Huxley, MD;  Location: [facility];  Service: Cardiovascular;  Laterality: Left;   THYROID SURGERY     pt does not remember if removed or partial removal    Prior to Admission medications   Medication Sig Start Date End Date Taking? Authorizing Provider  HYDROcodone-acetaminophen (NORCO) 5-325 MG tablet Take 1 tablet by mouth every 6 (six) hours as needed for up to 2 days. 12/16/20 12/18/20 Yes Vallarie Mare M, PA-C  ondansetron (ZOFRAN ODT) 4 MG disintegrating tablet Take 1 tablet (4 mg total) by mouth every 8 (eight) hours as  needed for up to 3 days. 12/16/20 12/19/20 Yes Vallarie Mare M, PA-C  Acetaminophen (TYLENOL PO) Take 500 mg by mouth.     [provider]  apixaban (ELIQUIS) 5 MG TABS tablet Take 5 mg by mouth 2 (two) times daily. 03/05/20   [provider]  clopidogrel (PLAVIX) 75 MG tablet Take 1 tablet (75 mg total) by mouth daily. 07/26/20   Fritzi Mandes, MD  empagliflozin (JARDIANCE) 10 MG TABS tablet Take 10 mg by mouth daily.    [provider]  ezetimibe (ZETIA) 10 MG tablet Take by mouth. 08/20/17 07/21/20  [provider]  ferrous sulfate 325 (65 FE) MG tablet Take 325 mg by mouth.  03/09/16   [provider]  fluticasone (FLONASE) 50 MCG/ACT nasal spray Place 2 sprays into the nose daily.    [provider]  Fluticasone-Salmeterol (ADVAIR) 250-50 MCG/DOSE AEPB Inhale 1 puff into the lungs daily. 08/13/18   [provider]  furosemide (LASIX) 20 MG tablet Take 1 tablet (20 mg total) by mouth  daily. 07/25/20   Fritzi Mandes, MD  glipiZIDE (GLUCOTROL) 5 MG tablet Take by mouth daily before breakfast.    [provider]  levothyroxine (SYNTHROID) 137 MCG tablet Take 1 tablet (137 mcg total) by mouth daily. 07/25/20   Fritzi Mandes, MD  loratadine (CLARITIN) 10 MG tablet Take 10 mg by mouth daily. 08/04/18   [provider]  losartan (COZAAR) 25 MG tablet Take by mouth. 12/02/18 07/21/20  [provider]  metFORMIN (GLUCOPHAGE) 500 MG tablet Take 500 mg by mouth 2 (two) times daily with a meal.    [provider]  metoprolol tartrate (LOPRESSOR) 25 MG tablet Take 1 tablet (25 mg total) by mouth 2 (two) times daily. 07/04/17   Fritzi Mandes, MD  Multiple Vitamin (MULTIVITAMIN) capsule Take 1 capsule by mouth daily.    [provider]  omega-3 acid ethyl esters (LOVAZA) 1 g capsule Take 2 g by mouth 2 (two) times daily.    [provider]  omeprazole (PRILOSEC) 20 MG capsule Take 20 mg by mouth 2 (two) times daily  before a meal. 01/09/16   [provider]  oxyCODONE-acetaminophen (PERCOCET) 5-325 MG tablet Take 1 tablet by mouth every 4 (four) hours as needed for severe pain. 07/11/20 07/11/21  Blake Divine, MD  venlafaxine (EFFEXOR) 75 MG tablet Take 75 mg by mouth 2 (two) times daily with a meal.    [provider]    Allergies Ace inhibitors, Atorvastatin, Statins, and Sulfa antibiotics  Family History  Problem Relation Age of Onset   Hypertension Mother    Hypertension Father    Diabetes Sister    Cancer Brother    Diabetes Brother    Breast cancer Neg Hx     Social History Social History   Tobacco Use   Smoking status: Every Day    Packs/day: 1.00    Types: Cigarettes   Smokeless tobacco: Never  Vaping Use   Vaping Use: Never used  Substance Use Topics   Alcohol use: No   Drug use: No     Review of Systems  Constitutional: No fever/chills Eyes:  No discharge ENT: No upper respiratory complaints. Respiratory: no cough. No SOB/ use of accessory muscles to breath Gastrointestinal:   No nausea, no vomiting.  No diarrhea.  No constipation. Musculoskeletal: Patient has left calf pain.  Skin: Negative for rash, abrasions, lacerations, ecchymosis.   ____________________________________________   PHYSICAL EXAM:  VITAL SIGNS: ED Triage Vitals  Enc Vitals Group     BP 12/16/20 1533 104/71     Pulse Rate 12/16/20 1533 66     Resp 12/16/20 1533 18     Temp 12/16/20 1533 98.7 F (37.1 C)     Temp Source 12/16/20 1533 Oral     SpO2 12/16/20 1533 96 %     Weight --      Height --      Head Circumference --      Peak Flow --      Pain Score 12/16/20 1534 7     Pain Loc --      Pain Edu? --      Excl. in [place]? --      Constitutional: Alert and oriented. Well appearing and in no acute distress. Eyes: Conjunctivae are normal. PERRL. EOMI. Head: Atraumatic. ENT:      Nose: No congestion/rhinnorhea.      Mouth/Throat: Mucous membranes are moist.  Neck:  No stridor.  No cervical spine tenderness to palpation. Cardiovascular: Normal  rate, regular rhythm. Normal S1 and S2.  Good peripheral circulation. Respiratory: Normal respiratory effort without tachypnea or retractions. Lungs CTAB. Good air entry to the bases with no decreased or absent breath sounds Gastrointestinal: Bowel sounds x 4 quadrants. Soft and nontender to palpation. No guarding or rigidity. No distention. Musculoskeletal: Full range of motion to all extremities. No obvious deformities noted.  Patient has no left calf pain to palpation.  Palpable dorsalis pedis pulse, left.  Capillary refill less than 2 seconds on the left. Neurologic:  Normal for age. No gross focal neurologic deficits are appreciated.  Skin:  Skin is warm, dry and intact. No rash noted. Psychiatric: Mood and affect are normal for age. Speech and behavior are normal.   ____________________________________________   LABS (all labs ordered are listed, but only abnormal results are displayed)  Labs Reviewed  COMPREHENSIVE METABOLIC PANEL - Abnormal; Notable for the following components:      Result Value   GFR, Estimated 58 (*)    All other components within normal limits  CBC WITH DIFFERENTIAL/PLATELET  CK   ____________________________________________  EKG   ____________________________________________  RADIOLOGY Unk Pinto, personally viewed and evaluated these images (plain radiographs) as part of my medical decision making, as well as reviewing the written report by the radiologist.  US Venous Img Lower Unilateral Left  Result Date: 12/16/2020 CLINICAL DATA:  calf pain EXAM: LEFT LOWER EXTREMITY VENOUS DOPPLER ULTRASOUND TECHNIQUE: Gray-scale sonography with compression, as well as color and duplex ultrasound, were performed to evaluate the deep venous system(s) from the level of the common femoral vein through the popliteal and proximal calf veins. COMPARISON:  None. FINDINGS: VENOUS Normal  compressibility of the common femoral, superficial femoral, and popliteal veins, as well as the visualized calf veins. Visualized portions of profunda femoral vein and great saphenous vein unremarkable. No filling defects to suggest DVT on grayscale or color Doppler imaging. Doppler waveforms show normal direction of venous flow, normal respiratory plasticity and response to augmentation. Limited views of the contralateral common femoral vein are unremarkable. OTHER None. Limitations: none IMPRESSION: Negative. Electronically Signed   By: Valentino Saxon M.D.   On: 12/16/2020 19:05    ____________________________________________    PROCEDURES  Procedure(s) performed:     Procedures     Medications  HYDROcodone-acetaminophen (NORCO/VICODIN) 5-325 MG per tablet 1 tablet (1 tablet Oral Given 12/16/20 1954)  ondansetron (ZOFRAN-ODT) disintegrating tablet 4 mg (4 mg Oral Given 12/16/20 1954)     ____________________________________________   INITIAL IMPRESSION / ASSESSMENT AND PLAN / ED COURSE  Pertinent labs & imaging results that were available during my care of the patient were reviewed by me and considered in my medical decision making (see chart for details).      Assessment and plan Calf pain 80 year old female presents to the emergency department with left calf pain for the past 2 to 3 days.  Vital signs are reassuring at triage.  On physical exam, patient was alert, active and nontoxic-appearing.  Patient had no edema or erythema of the left calf.  Patient had a palpable dorsalis pedis pulse on the left with capillary refill less than 2 seconds.  Skin overlying the left lower extremity was warm to the touch.  CBC, CMP and CK were within reference range.  Venous ultrasound showed no evidence of DVT.  Strongly suspect claudication as a source of patient's discomfort.  Emphasized the importance of following up with vascular physician, Dr. Lucky Cowboy to discuss peripheral vascular  disease and  scheduling possible carotid enterectomy.  Patient voiced understanding.  She was discharged with a 2-day course of Norco for pain.  All patient questions were answered.     ____________________________________________  FINAL CLINICAL IMPRESSION(S) / ED DIAGNOSES  Final diagnoses:  Pain of left lower extremity      NEW MEDICATIONS STARTED DURING THIS VISIT:  ED Discharge Orders          Ordered    HYDROcodone-acetaminophen (NORCO) 5-325 MG tablet  Every 6 hours PRN        12/16/20 1948    ondansetron (ZOFRAN ODT) 4 MG disintegrating tablet  Every 8 hours PRN        12/16/20 1948                This chart was dictated using voice recognition software/Dragon. Despite best efforts to proofread, errors can occur which can change the meaning. Any change was purely unintentional.     Lannie Fields, PA-C 12/17/20 Lucio Edward, MD 12/20/20 [phone]

## 2020-12-28 ENCOUNTER — Other Ambulatory Visit: Payer: Self-pay

## 2020-12-28 ENCOUNTER — Ambulatory Visit (INDEPENDENT_AMBULATORY_CARE_PROVIDER_SITE_OTHER): Payer: Medicare Other | Admitting: Vascular Surgery

## 2021-01-08 ENCOUNTER — Encounter (INDEPENDENT_AMBULATORY_CARE_PROVIDER_SITE_OTHER): Payer: Self-pay

## 2021-01-08 ENCOUNTER — Ambulatory Visit (INDEPENDENT_AMBULATORY_CARE_PROVIDER_SITE_OTHER): Payer: Medicare Other | Admitting: Vascular Surgery

## 2021-02-26 ENCOUNTER — Encounter (INDEPENDENT_AMBULATORY_CARE_PROVIDER_SITE_OTHER): Payer: Self-pay | Admitting: Vascular Surgery

## 2021-02-26 ENCOUNTER — Ambulatory Visit (INDEPENDENT_AMBULATORY_CARE_PROVIDER_SITE_OTHER): Payer: Medicare Other | Admitting: Vascular Surgery

## 2021-02-26 ENCOUNTER — Other Ambulatory Visit: Payer: Self-pay

## 2021-02-26 VITALS — BP 107/79 | HR 86 | Ht 63.0 in | Wt 135.0 lb

## 2021-02-26 DIAGNOSIS — G458 Other transient cerebral ischemic attacks and related syndromes: Secondary | ICD-10-CM | POA: Diagnosis not present

## 2021-02-26 DIAGNOSIS — I70219 Atherosclerosis of native arteries of extremities with intermittent claudication, unspecified extremity: Secondary | ICD-10-CM

## 2021-02-26 DIAGNOSIS — I6523 Occlusion and stenosis of bilateral carotid arteries: Secondary | ICD-10-CM | POA: Diagnosis not present

## 2021-02-26 DIAGNOSIS — I10 Essential (primary) hypertension: Secondary | ICD-10-CM

## 2021-02-26 DIAGNOSIS — E785 Hyperlipidemia, unspecified: Secondary | ICD-10-CM

## 2021-02-26 NOTE — Progress Notes (Signed)
MRN : [id number]  Christine Obrien is a 80 y.o. ([birth year]) female who presents with chief complaint of  Chief Complaint  Patient presents with   Follow-up    [facility] FU pain of LLE   .  History of Present Illness: Patient returns today in follow up of multiple issues.  She was scheduled to have a carotid endarterectomy performed earlier this year, but this got delayed by a fall with a leg fracture that required repair and a prolonged recovery.  This has not been checked in about 8 months or so at this point. She is also been having cramps in her calves.  Both legs have been affected there.  We have previously checked her circulation also earlier this year and she has previously had intervention about 3 years ago.  She has significant arthritic and musculoskeletal issues, but with her circulation history wanted to be sure that this was still intact with her pain.  Current Outpatient Medications  Medication Sig Dispense Refill   Acetaminophen (TYLENOL PO) Take 500 mg by mouth.      albuterol (VENTOLIN HFA) 108 (90 Base) MCG/ACT inhaler SMARTSIG:2 Inhalation Via Inhaler Every 4 Hours PRN     apixaban (ELIQUIS) 5 MG TABS tablet Take 5 mg by mouth 2 (two) times daily.     buPROPion ER (WELLBUTRIN SR) 100 MG 12 hr tablet Take 1 tablet by mouth daily.     clopidogrel (PLAVIX) 75 MG tablet Take 1 tablet (75 mg total) by mouth daily. 30 tablet 0   empagliflozin (JARDIANCE) 10 MG TABS tablet Take 10 mg by mouth daily.     ferrous sulfate 325 (65 FE) MG tablet Take 325 mg by mouth.   0   fluticasone (FLONASE) 50 MCG/ACT nasal spray Place 2 sprays into the nose daily.     Fluticasone-Salmeterol (ADVAIR) 250-50 MCG/DOSE AEPB Inhale 1 puff into the lungs daily.     furosemide (LASIX) 20 MG tablet Take 1 tablet (20 mg total) by mouth daily. 15 tablet 0   glipiZIDE (GLUCOTROL) 5 MG tablet Take by mouth daily before breakfast.     levothyroxine (SYNTHROID) 137 MCG tablet Take 1 tablet (137 mcg total) by mouth  daily. 30 tablet 3   loratadine (CLARITIN) 10 MG tablet Take 10 mg by mouth daily.     metFORMIN (GLUCOPHAGE) 500 MG tablet Take 500 mg by mouth 2 (two) times daily with a meal.     metoprolol tartrate (LOPRESSOR) 25 MG tablet Take 1 tablet (25 mg total) by mouth 2 (two) times daily. 60 tablet 0   Multiple Vitamin (MULTIVITAMIN) capsule Take 1 capsule by mouth daily.     Omega-3 Fatty Acids (FISH OIL) 1000 MG CAPS Take 2 capsules by mouth 2 (two) times daily.     omeprazole (PRILOSEC) 20 MG capsule Take 20 mg by mouth 2 (two) times daily before a meal.     oxyCODONE-acetaminophen (PERCOCET) 5-325 MG tablet Take 1 tablet by mouth every 4 (four) hours as needed for severe pain. 12 tablet 0   rosuvastatin (CRESTOR) 5 MG tablet Take 5 mg by mouth daily.     venlafaxine (EFFEXOR) 75 MG tablet Take 75 mg by mouth 2 (two) times daily with a meal.     ezetimibe (ZETIA) 10 MG tablet Take by mouth.     losartan (COZAAR) 25 MG tablet Take by mouth.     No current facility-administered medications for this visit.    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)    Cancer (HCC)    cervical   Carotid artery occlusion    CHF (congestive heart failure), NYHA class III (HCC)    CKD (chronic kidney disease), stage III (HCC)    COPD (chronic obstructive pulmonary disease) (HCC)    Coronary artery disease    Current use of long term anticoagulation    Depression    Diabetes mellitus without complication (HCC)    GERD (gastroesophageal reflux disease)    Gout    HLD (hyperlipidemia)    Hx of CABG    Hypertension    Hypothyroidism    Kidney stones    LBBB (left bundle branch block)    Myocardial infarction ([place])    Subclavian steal syndrome     Past Surgical History:  Procedure Laterality Date   ABDOMINAL HYSTERECTOMY     partial   CHOLECYSTECTOMY     COLONOSCOPY WITH PROPOFOL N/A 01/11/2020    Procedure: COLONOSCOPY WITH PROPOFOL;  Surgeon: Toledo, Benay Pike, MD;  Location: [facility];  Service: Gastroenterology;  Laterality: N/A;   CORONARY ANGIOPLASTY     CORONARY ARTERY BYPASS GRAFT     double   EYE SURGERY Bilateral 08/2009   cataracts   HIATAL HERNIA REPAIR  1980s   LEFT HEART CATH AND CORONARY ANGIOGRAPHY N/A 07/03/2017   Procedure: LEFT HEART CATH AND CORONARY ANGIOGRAPHY;  Surgeon: Corey Skains, MD;  Location: [place] [facility];  Service: Cardiovascular;  Laterality: N/A;   LOWER EXTREMITY ANGIOGRAPHY Left 01/11/2018   Procedure: LOWER EXTREMITY ANGIOGRAPHY;  Surgeon: Algernon Huxley, MD;  Location: [place] [facility];  Service: Cardiovascular;  Laterality: Left;   THYROID SURGERY     pt does not remember if removed or partial removal     Social History   Tobacco Use   Smoking status: Every Day    Packs/day: 1.00    Types: Cigarettes   Smokeless tobacco: Never  Vaping Use   Vaping Use: Never used  Substance Use Topics   Alcohol use: No   Drug use: No      Family History  Problem Relation Age of Onset   Hypertension Mother    Hypertension Father    Diabetes Sister    Cancer Brother    Diabetes Brother    Breast cancer Neg Hx      Allergies  Allergen Reactions   Ace Inhibitors     Other reaction(s): Cough   Atorvastatin     Other reaction(s): Muscle Pain   Statins     Other reaction(s): Muscle Pain Muscle pain   Sulfa Antibiotics     Stomach pains   REVIEW OF SYSTEMS (Negative unless checked)   Constitutional: [] Weight loss  [] Fever  [] Chills Cardiac: [] Chest pain   [] Chest pressure   [] Palpitations   [] Shortness of breath when laying flat   [] Shortness of breath at rest   [] Shortness of breath with exertion. Vascular:  [x] Pain in legs with walking   [] Pain in legs at rest   [] Pain in legs when laying flat   [x] Claudication   [] Pain in feet when walking  [] Pain in feet at rest  [] Pain in feet when laying flat   [] History of DVT    [] Phlebitis   [x] Swelling in legs   [] Varicose veins   [] Non-healing ulcers Pulmonary:   [] Uses home oxygen   [] Productive cough   [] Hemoptysis   [] Wheeze  []   COPD   [] Asthma Neurologic:  [x] Dizziness  [] Blackouts   [] Seizures   [] History of stroke   [] History of TIA  [] Aphasia   [] Temporary blindness   [] Dysphagia   [] Weakness or numbness in arms   [] Weakness or numbness in legs Musculoskeletal:  [x] Arthritis   [] Joint swelling   [] Joint pain   [] Low back pain Hematologic:  [] Easy bruising  [] Easy bleeding   [] Hypercoagulable state   [] Anemic   Gastrointestinal:  [] Blood in stool   [] Vomiting blood  [] Gastroesophageal reflux/heartburn   [] Abdominal pain Genitourinary:  [] Chronic kidney disease   [] Difficult urination  [] Frequent urination  [] Burning with urination   [] Hematuria Skin:  [] Rashes   [] Ulcers   [] Wounds Psychological:  [] History of anxiety   []  History of major depression.   Physical Examination  BP 107/79   Pulse 86   Ht 5\' 3"  (1.6 m)   Wt 135 lb (61.2 kg)   BMI 23.91 kg/m  Gen:  WD/WN, NAD Head: [place]/AT, No temporalis wasting. Ear/Nose/Throat: Hearing grossly intact, nares w/o erythema or drainage Eyes: Conjunctiva clear. Sclera non-icteric Neck: Supple.  Trachea midline Pulmonary:  Good air movement, no use of accessory muscles.  Cardiac: RRR, no JVD Vascular:  Vessel Right Left  Radial 2+ palpable 1+ palpable                          PT 1+ palpable 1+ palpable  DP 2+ palpable 1+ palpable   Gastrointestinal: soft, non-tender/non-distended. No guarding/reflex.  Musculoskeletal: M/S 5/5 throughout.  No deformity or atrophy.  Mild bilateral lower extremity edema. Neurologic: Sensation grossly intact in extremities.  Symmetrical.  Speech is fluent.  Psychiatric: Judgment intact, Mood & affect appropriate for pt's clinical situation. Dermatologic: No rashes or ulcers noted.  No cellulitis or open wounds.      Labs Recent Results (from the past 2160 hour(s))   I-STAT creatinine     Status: Abnormal   Collection Time: 12/12/20  3:40 PM  Result Value Ref Range   Creatinine, Ser 1.20 (H) 0.44 - 1.00 mg/dL  CBC with Differential     Status: None   Collection Time: 12/16/20  6:06 PM  Result Value Ref Range   WBC 6.9 4.0 - 10.5 K/uL   RBC 4.17 3.87 - 5.11 MIL/uL   Hemoglobin 13.7 12.0 - 15.0 g/dL   HCT 39.9 36.0 - 46.0 %   MCV 95.7 80.0 - 100.0 fL   MCH 32.9 26.0 - 34.0 pg   MCHC 34.3 30.0 - 36.0 g/dL   RDW 12.9 11.5 - 15.5 %   Platelets 169 150 - 400 K/uL   nRBC 0.0 0.0 - 0.2 %   Neutrophils Relative % 59 %   Neutro Abs 4.1 1.7 - 7.7 K/uL   Lymphocytes Relative 32 %   Lymphs Abs 2.2 0.7 - 4.0 K/uL   Monocytes Relative 5 %   Monocytes Absolute 0.3 0.1 - 1.0 K/uL   Eosinophils Relative 4 %   Eosinophils Absolute 0.3 0.0 - 0.5 K/uL   Basophils Relative 0 %   Basophils Absolute 0.0 0.0 - 0.1 K/uL   Immature Granulocytes 0 %   Abs Immature Granulocytes 0.01 0.00 - 0.07 K/uL    Comment: Performed at [facility], [address], [place], [place] [address]  Comprehensive metabolic panel     Status: Abnormal   Collection Time: 12/16/20  6:06 PM  Result Value Ref Range   Sodium 138 135 - 145  mmol/L   Potassium 4.1 3.5 - 5.1 mmol/L   Chloride 100 98 - 111 mmol/L   CO2 30 22 - 32 mmol/L   Glucose, Bld 88 70 - 99 mg/dL    Comment: Glucose reference range applies only to samples taken after fasting for at least 8 hours.   BUN 16 8 - 23 mg/dL   Creatinine, Ser 0.99 0.44 - 1.00 mg/dL   Calcium 9.8 8.9 - 10.3 mg/dL   Total Protein 7.0 6.5 - 8.1 g/dL   Albumin 3.9 3.5 - 5.0 g/dL   AST 22 15 - 41 U/L   ALT 23 0 - 44 U/L   Alkaline Phosphatase 56 38 - 126 U/L   Total Bilirubin 0.6 0.3 - 1.2 mg/dL   GFR, Estimated 58 (L) >60 mL/min    Comment: (NOTE) Calculated using the CKD-EPI Creatinine Equation (2021)    Anion gap 8 5 - 15    Comment: Performed at [facility], [address] [place], [place] [address]  CK      Status: None   Collection Time: 12/16/20  6:06 PM  Result Value Ref Range   Total CK 39 38 - 234 U/L    Comment: Performed at [facility], [address], [place], [place] [address]    Radiology No results found.  Assessment/Plan Diabetes (HCC) blood glucose control important in reducing the progression of atherosclerotic disease. Also, involved in wound healing. On appropriate medications.   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  No treatment currently required although this did look fairly severe on her recent CT angiogram.   Hyperlipidemia lipid control important in reducing the progression of atherosclerotic disease. Continue statin therapy  Carotid stenosis I would recommend a carotid duplex be performed before we consider any further intervention to ensure that she has not had occlusion.  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  We will get ABIs in the near future.    Leotis Pain, MD  02/26/2021 1:47 PM    This note was created with Dragon medical transcription system.  Any errors from dictation are purely unintentional

## 2021-02-26 NOTE — Assessment & Plan Note (Signed)
Perfusion has not been checked in some time and she is now complaining of significant leg pain.  We will get ABIs in the near future.

## 2021-02-26 NOTE — Assessment & Plan Note (Signed)
I would recommend a carotid duplex be performed before we consider any further intervention to ensure that she has not had occlusion.

## 2021-03-07 ENCOUNTER — Telehealth (INDEPENDENT_AMBULATORY_CARE_PROVIDER_SITE_OTHER): Payer: Self-pay | Admitting: Vascular Surgery

## 2021-03-07 NOTE — Telephone Encounter (Signed)
LVM on both the pt's # and daughter Abbe Amsterdam and schedule appt from 11.08.22. need lab only  ASAP carotid + abi

## 2021-03-12 ENCOUNTER — Other Ambulatory Visit: Payer: Self-pay

## 2021-03-12 ENCOUNTER — Ambulatory Visit (INDEPENDENT_AMBULATORY_CARE_PROVIDER_SITE_OTHER): Payer: Medicare Other

## 2021-03-12 DIAGNOSIS — I70219 Atherosclerosis of native arteries of extremities with intermittent claudication, unspecified extremity: Secondary | ICD-10-CM

## 2021-03-12 DIAGNOSIS — I6523 Occlusion and stenosis of bilateral carotid arteries: Secondary | ICD-10-CM | POA: Diagnosis not present

## 2021-04-16 ENCOUNTER — Encounter (INDEPENDENT_AMBULATORY_CARE_PROVIDER_SITE_OTHER): Payer: Self-pay

## 2021-04-16 ENCOUNTER — Ambulatory Visit (INDEPENDENT_AMBULATORY_CARE_PROVIDER_SITE_OTHER): Payer: Medicare Other | Admitting: Vascular Surgery

## 2021-04-16 ENCOUNTER — Encounter (INDEPENDENT_AMBULATORY_CARE_PROVIDER_SITE_OTHER): Payer: Medicare Other

## 2021-04-23 ENCOUNTER — Encounter (INDEPENDENT_AMBULATORY_CARE_PROVIDER_SITE_OTHER): Payer: Self-pay | Admitting: Vascular Surgery

## 2021-04-23 ENCOUNTER — Ambulatory Visit (INDEPENDENT_AMBULATORY_CARE_PROVIDER_SITE_OTHER): Payer: Medicare Other | Admitting: Vascular Surgery

## 2021-04-23 VITALS — BP 109/61 | HR 87 | Resp 16 | Wt 134.0 lb

## 2021-04-23 DIAGNOSIS — M79605 Pain in left leg: Secondary | ICD-10-CM

## 2021-04-23 DIAGNOSIS — I6523 Occlusion and stenosis of bilateral carotid arteries: Secondary | ICD-10-CM | POA: Diagnosis not present

## 2021-04-23 DIAGNOSIS — G458 Other transient cerebral ischemic attacks and related syndromes: Secondary | ICD-10-CM | POA: Diagnosis not present

## 2021-04-23 DIAGNOSIS — E1151 Type 2 diabetes mellitus with diabetic peripheral angiopathy without gangrene: Secondary | ICD-10-CM | POA: Diagnosis not present

## 2021-04-23 DIAGNOSIS — M79604 Pain in right leg: Secondary | ICD-10-CM

## 2021-04-23 DIAGNOSIS — E785 Hyperlipidemia, unspecified: Secondary | ICD-10-CM

## 2021-04-23 DIAGNOSIS — I10 Essential (primary) hypertension: Secondary | ICD-10-CM

## 2021-04-23 DIAGNOSIS — M79609 Pain in unspecified limb: Secondary | ICD-10-CM | POA: Insufficient documentation

## 2021-04-23 NOTE — Assessment & Plan Note (Signed)
ABIs recently checked and were normal.  Unlikely to be peripheral arterial disease causing her symptoms.

## 2021-04-23 NOTE — Assessment & Plan Note (Signed)
Carotid duplex shows high-grade right carotid artery stenosis without progression to occlusion since her study last year.  Left carotid stenosis with velocities of fall in the 40 to 59% range.  We had previously planned right carotid endarterectomy but this was delayed due to a major orthopedic injury and subsequent recovery.  She has now recovered and ready to have her surgery.  Right carotid endarterectomy will be done in the near future at her convenience. Risks and benefits discussed.

## 2021-04-23 NOTE — Progress Notes (Signed)
MRN : [id number]  Christine Obrien is a 81 y.o. ([birth year]) female who presents with chief complaint of  Chief Complaint  Patient presents with   Follow-up    Discuss surgery  .  History of Present Illness: Patient returns today in follow up of her carotid disease.  Last year, she was scheduled for right carotid endarterectomy but this got put on hold after a major orthopedic injury and subsequent prolonged recovery.  She is now recovered and ready to proceed with right carotid endarterectomy.  We recently did a carotid duplex demonstrating 80 to 99% right ICA stenosis without progression to occlusion from her previous study.  Her left carotid artery velocities fall in the 40 to 59% range.  Current Outpatient Medications  Medication Sig Dispense Refill   Acetaminophen (TYLENOL PO) Take 500 mg by mouth.      albuterol (VENTOLIN HFA) 108 (90 Base) MCG/ACT inhaler SMARTSIG:2 Inhalation Via Inhaler Every 4 Hours PRN     apixaban (ELIQUIS) 5 MG TABS tablet Take 5 mg by mouth 2 (two) times daily.     buPROPion ER (WELLBUTRIN SR) 100 MG 12 hr tablet Take 1 tablet by mouth daily.     clopidogrel (PLAVIX) 75 MG tablet Take 1 tablet (75 mg total) by mouth daily. 30 tablet 0   empagliflozin (JARDIANCE) 10 MG TABS tablet Take 10 mg by mouth daily.     ferrous sulfate 325 (65 FE) MG tablet Take 325 mg by mouth.   0   fluticasone (FLONASE) 50 MCG/ACT nasal spray Place 2 sprays into the nose daily.     Fluticasone-Salmeterol (ADVAIR) 250-50 MCG/DOSE AEPB Inhale 1 puff into the lungs daily.     furosemide (LASIX) 20 MG tablet Take 1 tablet (20 mg total) by mouth daily. 15 tablet 0   glipiZIDE (GLUCOTROL) 5 MG tablet Take by mouth daily before breakfast.     levothyroxine (SYNTHROID) 137 MCG tablet Take 1 tablet (137 mcg total) by mouth daily. 30 tablet 3   loratadine (CLARITIN) 10 MG tablet Take 10 mg by mouth daily.     metFORMIN (GLUCOPHAGE) 500 MG tablet Take 500 mg by mouth 2 (two) times daily with a  meal.     metoprolol tartrate (LOPRESSOR) 25 MG tablet Take 1 tablet (25 mg total) by mouth 2 (two) times daily. 60 tablet 0   Multiple Vitamin (MULTIVITAMIN) capsule Take 1 capsule by mouth daily.     Omega-3 Fatty Acids (FISH OIL) 1000 MG CAPS Take 2 capsules by mouth 2 (two) times daily.     omeprazole (PRILOSEC) 20 MG capsule Take 20 mg by mouth 2 (two) times daily before a meal.     rosuvastatin (CRESTOR) 5 MG tablet Take 5 mg by mouth daily.     venlafaxine (EFFEXOR) 75 MG tablet Take 75 mg by mouth 2 (two) times daily with a meal.     ezetimibe (ZETIA) 10 MG tablet Take by mouth.     losartan (COZAAR) 25 MG tablet Take by mouth.     oxyCODONE-acetaminophen (PERCOCET) 5-325 MG tablet Take 1 tablet by mouth every 4 (four) hours as needed for severe pain. (Patient not taking: Reported on 04/23/2021) 12 tablet 0   No current facility-administered medications for this visit.    Past Medical History:  Diagnosis Date   Anxiety    Asthma    Atherosclerotic peripheral vascular disease with intermittent claudication ([place])    Cancer ([place])    cervical   Carotid artery occlusion  CHF (congestive heart failure), NYHA class III (HCC)    CKD (chronic kidney disease), stage III (HCC)    COPD (chronic obstructive pulmonary disease) (HCC)    Coronary artery disease    Current use of long term anticoagulation    Depression    Diabetes mellitus without complication (HCC)    GERD (gastroesophageal reflux disease)    Gout    HLD (hyperlipidemia)    Hx of CABG    Hypertension    Hypothyroidism    Kidney stones    LBBB (left bundle branch block)    Myocardial infarction ([place])    Subclavian steal syndrome     Past Surgical History:  Procedure Laterality Date   ABDOMINAL HYSTERECTOMY     partial   CHOLECYSTECTOMY     COLONOSCOPY WITH PROPOFOL N/A 01/11/2020   Procedure: COLONOSCOPY WITH PROPOFOL;  Surgeon: Toledo, Benay Pike, MD;  Location: [facility];  Service: Gastroenterology;   Laterality: N/A;   CORONARY ANGIOPLASTY     CORONARY ARTERY BYPASS GRAFT     double   EYE SURGERY Bilateral 08/2009   cataracts   HIATAL HERNIA REPAIR  1980s   LEFT HEART CATH AND CORONARY ANGIOGRAPHY N/A 07/03/2017   Procedure: LEFT HEART CATH AND CORONARY ANGIOGRAPHY;  Surgeon: Corey Skains, MD;  Location: [place] [facility];  Service: Cardiovascular;  Laterality: N/A;   LOWER EXTREMITY ANGIOGRAPHY Left 01/11/2018   Procedure: LOWER EXTREMITY ANGIOGRAPHY;  Surgeon: Algernon Huxley, MD;  Location: [place] [facility];  Service: Cardiovascular;  Laterality: Left;   THYROID SURGERY     pt does not remember if removed or partial removal     Social History   Tobacco Use   Smoking status: Every Day    Packs/day: 1.00    Types: Cigarettes   Smokeless tobacco: Never  Vaping Use   Vaping Use: Never used  Substance Use Topics   Alcohol use: No   Drug use: No      Family History  Problem Relation Age of Onset   Hypertension Mother    Hypertension Father    Diabetes Sister    Cancer Brother    Diabetes Brother    Breast cancer Neg Hx      Allergies  Allergen Reactions   Ace Inhibitors     Other reaction(s): Cough   Atorvastatin     Other reaction(s): Muscle Pain   Statins     Other reaction(s): Muscle Pain Muscle pain   Sulfa Antibiotics     Stomach pains      REVIEW OF SYSTEMS (Negative unless checked)   Constitutional: [] Weight loss  [] Fever  [] Chills Cardiac: [] Chest pain   [] Chest pressure   [] Palpitations   [] Shortness of breath when laying flat   [] Shortness of breath at rest   [] Shortness of breath with exertion. Vascular:  [x] Pain in legs with walking   [] Pain in legs at rest   [] Pain in legs when laying flat   [x] Claudication   [] Pain in feet when walking  [] Pain in feet at rest  [] Pain in feet when laying flat   [] History of DVT   [] Phlebitis   [x] Swelling in legs   [] Varicose veins   [] Non-healing ulcers Pulmonary:   [] Uses home oxygen   [] Productive  cough   [] Hemoptysis   [] Wheeze  [] COPD   [] Asthma Neurologic:  [x] Dizziness  [] Blackouts   [] Seizures   [] History of stroke   [] History of TIA  [] Aphasia   [] Temporary blindness   []   Dysphagia   [] Weakness or numbness in arms   [] Weakness or numbness in legs Musculoskeletal:  [x] Arthritis   [] Joint swelling   [] Joint pain   [] Low back pain Hematologic:  [] Easy bruising  [] Easy bleeding   [] Hypercoagulable state   [] Anemic   Gastrointestinal:  [] Blood in stool   [] Vomiting blood  [] Gastroesophageal reflux/heartburn   [] Abdominal pain Genitourinary:  [] Chronic kidney disease   [] Difficult urination  [] Frequent urination  [] Burning with urination   [] Hematuria Skin:  [] Rashes   [] Ulcers   [] Wounds Psychological:  [] History of anxiety   []  History of major depression.  Physical Examination  BP 109/61 (BP Location: Right Arm)    Pulse 87    Resp 16    Wt 134 lb (60.8 kg)    BMI 23.74 kg/m  Gen:  WD/WN, NAD Head: [place]/AT, No temporalis wasting. Ear/Nose/Throat: Hearing grossly intact, nares w/o erythema or drainage Eyes: Conjunctiva clear. Sclera non-icteric Neck: Supple.  Trachea midline Pulmonary:  Good air movement, no use of accessory muscles.  Cardiac: RRR, no JVD Vascular:  Vessel Right Left  Radial Palpable Palpable           Musculoskeletal: M/S 5/5 throughout.  No deformity or atrophy. Trace LE edema. Neurologic: Sensation grossly intact in extremities.  Symmetrical.  Speech is fluent.  Psychiatric: Judgment intact, Mood & affect appropriate for pt's clinical situation. Dermatologic: No rashes or ulcers noted.  No cellulitis or open wounds.      Labs No results found for this or any previous visit (from the past 2160 hour(s)).  Radiology No results found.  Assessment/Plan Diabetes (HCC) blood glucose control important in reducing the progression of atherosclerotic disease. Also, involved in wound healing. On appropriate medications.   Subclavian steal syndrome Not  particularly symptomatic with her left subclavian artery disease.  No treatment currently required although this did look fairly severe on her recent CT angiogram.   Hyperlipidemia lipid control important in reducing the progression of atherosclerotic disease. Continue statin therapy  Pain in limb ABIs recently checked and were normal.  Unlikely to be peripheral arterial disease causing her symptoms.  Carotid stenosis Carotid duplex shows high-grade right carotid artery stenosis without progression to occlusion since her study last year.  Left carotid stenosis with velocities of fall in the 40 to 59% range.  We had previously planned right carotid endarterectomy but this was delayed due to a major orthopedic injury and subsequent recovery.  She has now recovered and ready to have her surgery.  Right carotid endarterectomy will be done in the near future at her convenience. Risks and benefits discussed.     Leotis Pain, MD  04/23/2021 2:39 PM    This note was created with Dragon medical transcription system.  Any errors from dictation are purely unintentional

## 2021-04-23 NOTE — H&P (View-Only) (Signed)
MRN : [id number]  Christine Obrien is a 81 y.o. ([birth year]) female who presents with chief complaint of  Chief Complaint  Patient presents with   Follow-up    Discuss surgery  .  History of Present Illness: Patient returns today in follow up of her carotid disease.  Last year, she was scheduled for right carotid endarterectomy but this got put on hold after a major orthopedic injury and subsequent prolonged recovery.  She is now recovered and ready to proceed with right carotid endarterectomy.  We recently did a carotid duplex demonstrating 80 to 99% right ICA stenosis without progression to occlusion from her previous study.  Her left carotid artery velocities fall in the 40 to 59% range.  Current Outpatient Medications  Medication Sig Dispense Refill   Acetaminophen (TYLENOL PO) Take 500 mg by mouth.      albuterol (VENTOLIN HFA) 108 (90 Base) MCG/ACT inhaler SMARTSIG:2 Inhalation Via Inhaler Every 4 Hours PRN     apixaban (ELIQUIS) 5 MG TABS tablet Take 5 mg by mouth 2 (two) times daily.     buPROPion ER (WELLBUTRIN SR) 100 MG 12 hr tablet Take 1 tablet by mouth daily.     clopidogrel (PLAVIX) 75 MG tablet Take 1 tablet (75 mg total) by mouth daily. 30 tablet 0   empagliflozin (JARDIANCE) 10 MG TABS tablet Take 10 mg by mouth daily.     ferrous sulfate 325 (65 FE) MG tablet Take 325 mg by mouth.   0   fluticasone (FLONASE) 50 MCG/ACT nasal spray Place 2 sprays into the nose daily.     Fluticasone-Salmeterol (ADVAIR) 250-50 MCG/DOSE AEPB Inhale 1 puff into the lungs daily.     furosemide (LASIX) 20 MG tablet Take 1 tablet (20 mg total) by mouth daily. 15 tablet 0   glipiZIDE (GLUCOTROL) 5 MG tablet Take by mouth daily before breakfast.     levothyroxine (SYNTHROID) 137 MCG tablet Take 1 tablet (137 mcg total) by mouth daily. 30 tablet 3   loratadine (CLARITIN) 10 MG tablet Take 10 mg by mouth daily.     metFORMIN (GLUCOPHAGE) 500 MG tablet Take 500 mg by mouth 2 (two) times daily with a  meal.     metoprolol tartrate (LOPRESSOR) 25 MG tablet Take 1 tablet (25 mg total) by mouth 2 (two) times daily. 60 tablet 0   Multiple Vitamin (MULTIVITAMIN) capsule Take 1 capsule by mouth daily.     Omega-3 Fatty Acids (FISH OIL) 1000 MG CAPS Take 2 capsules by mouth 2 (two) times daily.     omeprazole (PRILOSEC) 20 MG capsule Take 20 mg by mouth 2 (two) times daily before a meal.     rosuvastatin (CRESTOR) 5 MG tablet Take 5 mg by mouth daily.     venlafaxine (EFFEXOR) 75 MG tablet Take 75 mg by mouth 2 (two) times daily with a meal.     ezetimibe (ZETIA) 10 MG tablet Take by mouth.     losartan (COZAAR) 25 MG tablet Take by mouth.     oxyCODONE-acetaminophen (PERCOCET) 5-325 MG tablet Take 1 tablet by mouth every 4 (four) hours as needed for severe pain. (Patient not taking: Reported on 04/23/2021) 12 tablet 0   No current facility-administered medications for this visit.    Past Medical History:  Diagnosis Date   Anxiety    Asthma    Atherosclerotic peripheral vascular disease with intermittent claudication ([place])    Cancer ([place])    cervical   Carotid artery occlusion  CHF (congestive heart failure), NYHA class III (HCC)    CKD (chronic kidney disease), stage III (HCC)    COPD (chronic obstructive pulmonary disease) (HCC)    Coronary artery disease    Current use of long term anticoagulation    Depression    Diabetes mellitus without complication (HCC)    GERD (gastroesophageal reflux disease)    Gout    HLD (hyperlipidemia)    Hx of CABG    Hypertension    Hypothyroidism    Kidney stones    LBBB (left bundle branch block)    Myocardial infarction ([place])    Subclavian steal syndrome     Past Surgical History:  Procedure Laterality Date   ABDOMINAL HYSTERECTOMY     partial   CHOLECYSTECTOMY     COLONOSCOPY WITH PROPOFOL N/A 01/11/2020   Procedure: COLONOSCOPY WITH PROPOFOL;  Surgeon: Toledo, Benay Pike, MD;  Location: [facility];  Service: Gastroenterology;   Laterality: N/A;   CORONARY ANGIOPLASTY     CORONARY ARTERY BYPASS GRAFT     double   EYE SURGERY Bilateral 08/2009   cataracts   HIATAL HERNIA REPAIR  1980s   LEFT HEART CATH AND CORONARY ANGIOGRAPHY N/A 07/03/2017   Procedure: LEFT HEART CATH AND CORONARY ANGIOGRAPHY;  Surgeon: Corey Skains, MD;  Location: [facility];  Service: Cardiovascular;  Laterality: N/A;   LOWER EXTREMITY ANGIOGRAPHY Left 01/11/2018   Procedure: LOWER EXTREMITY ANGIOGRAPHY;  Surgeon: Algernon Huxley, MD;  Location: [facility];  Service: Cardiovascular;  Laterality: Left;   THYROID SURGERY     pt does not remember if removed or partial removal     Social History   Tobacco Use   Smoking status: Every Day    Packs/day: 1.00    Types: Cigarettes   Smokeless tobacco: Never  Vaping Use   Vaping Use: Never used  Substance Use Topics   Alcohol use: No   Drug use: No      Family History  Problem Relation Age of Onset   Hypertension Mother    Hypertension Father    Diabetes Sister    Cancer Brother    Diabetes Brother    Breast cancer Neg Hx      Allergies  Allergen Reactions   Ace Inhibitors     Other reaction(s): Cough   Atorvastatin     Other reaction(s): Muscle Pain   Statins     Other reaction(s): Muscle Pain Muscle pain   Sulfa Antibiotics     Stomach pains      REVIEW OF SYSTEMS (Negative unless checked)   Constitutional: [] Weight loss  [] Fever  [] Chills Cardiac: [] Chest pain   [] Chest pressure   [] Palpitations   [] Shortness of breath when laying flat   [] Shortness of breath at rest   [] Shortness of breath with exertion. Vascular:  [x] Pain in legs with walking   [] Pain in legs at rest   [] Pain in legs when laying flat   [x] Claudication   [] Pain in feet when walking  [] Pain in feet at rest  [] Pain in feet when laying flat   [] History of DVT   [] Phlebitis   [x] Swelling in legs   [] Varicose veins   [] Non-healing ulcers Pulmonary:   [] Uses home oxygen   [] Productive  cough   [] Hemoptysis   [] Wheeze  [] COPD   [] Asthma Neurologic:  [x] Dizziness  [] Blackouts   [] Seizures   [] History of stroke   [] History of TIA  [] Aphasia   [] Temporary blindness   []   Dysphagia   [] Weakness or numbness in arms   [] Weakness or numbness in legs Musculoskeletal:  [x] Arthritis   [] Joint swelling   [] Joint pain   [] Low back pain Hematologic:  [] Easy bruising  [] Easy bleeding   [] Hypercoagulable state   [] Anemic   Gastrointestinal:  [] Blood in stool   [] Vomiting blood  [] Gastroesophageal reflux/heartburn   [] Abdominal pain Genitourinary:  [] Chronic kidney disease   [] Difficult urination  [] Frequent urination  [] Burning with urination   [] Hematuria Skin:  [] Rashes   [] Ulcers   [] Wounds Psychological:  [] History of anxiety   []  History of major depression.  Physical Examination  BP 109/61 (BP Location: Right Arm)    Pulse 87    Resp 16    Wt 134 lb (60.8 kg)    BMI 23.74 kg/m  Gen:  WD/WN, NAD Head: [place]/AT, No temporalis wasting. Ear/Nose/Throat: Hearing grossly intact, nares w/o erythema or drainage Eyes: Conjunctiva clear. Sclera non-icteric Neck: Supple.  Trachea midline Pulmonary:  Good air movement, no use of accessory muscles.  Cardiac: RRR, no JVD Vascular:  Vessel Right Left  Radial Palpable Palpable           Musculoskeletal: M/S 5/5 throughout.  No deformity or atrophy. Trace LE edema. Neurologic: Sensation grossly intact in extremities.  Symmetrical.  Speech is fluent.  Psychiatric: Judgment intact, Mood & affect appropriate for pt's clinical situation. Dermatologic: No rashes or ulcers noted.  No cellulitis or open wounds.      Labs No results found for this or any previous visit (from the past 2160 hour(s)).  Radiology No results found.  Assessment/Plan Diabetes (HCC) blood glucose control important in reducing the progression of atherosclerotic disease. Also, involved in wound healing. On appropriate medications.   Subclavian steal syndrome Not  particularly symptomatic with her left subclavian artery disease.  No treatment currently required although this did look fairly severe on her recent CT angiogram.   Hyperlipidemia lipid control important in reducing the progression of atherosclerotic disease. Continue statin therapy  Pain in limb ABIs recently checked and were normal.  Unlikely to be peripheral arterial disease causing her symptoms.  Carotid stenosis Carotid duplex shows high-grade right carotid artery stenosis without progression to occlusion since her study last year.  Left carotid stenosis with velocities of fall in the 40 to 59% range.  We had previously planned right carotid endarterectomy but this was delayed due to a major orthopedic injury and subsequent recovery.  She has now recovered and ready to have her surgery.  Right carotid endarterectomy will be done in the near future at her convenience. Risks and benefits discussed.     Leotis Pain, MD  04/23/2021 2:39 PM    This note was created with Dragon medical transcription system.  Any errors from dictation are purely unintentional

## 2021-04-26 ENCOUNTER — Telehealth (INDEPENDENT_AMBULATORY_CARE_PROVIDER_SITE_OTHER): Payer: Self-pay

## 2021-04-26 NOTE — Telephone Encounter (Signed)
Patient returned my call and was given all information as well as it being mailed.

## 2021-04-26 NOTE — Telephone Encounter (Signed)
I attempted to contact the patient again to let her know she has been scheduled with Dr. Lucky Cowboy for a right carotid endarterectomy on 05/08/21 at the [facility] as we discussed. Pre-op phone call on 05/01/21 between 8-1 pm and covid testing on 05/06/21 between 8-12 pm at the [facility]. Pre-surgical instructions will be mailed and a message was left for a return call.

## 2021-05-01 ENCOUNTER — Other Ambulatory Visit
Admission: RE | Admit: 2021-05-01 | Discharge: 2021-05-01 | Disposition: A | Discharge status: Home / Self Care | Payer: Medicare Other | Source: Ambulatory Visit | Attending: Vascular Surgery | Admitting: Vascular Surgery

## 2021-05-01 ENCOUNTER — Other Ambulatory Visit (INDEPENDENT_AMBULATORY_CARE_PROVIDER_SITE_OTHER): Payer: Self-pay | Admitting: Nurse Practitioner

## 2021-05-01 ENCOUNTER — Other Ambulatory Visit: Payer: Self-pay

## 2021-05-01 DIAGNOSIS — I6523 Occlusion and stenosis of bilateral carotid arteries: Secondary | ICD-10-CM

## 2021-05-01 HISTORY — DX: Presence of cardiac pacemaker: Z95.0

## 2021-05-01 HISTORY — DX: Presence of automatic (implantable) cardiac defibrillator: Z95.810

## 2021-05-01 NOTE — Pre-Procedure Instructions (Signed)
Cardiac clearance had already been obtained by surgeons office on 04-25-21 and they had not faxed it over to [place] in PAT. Cardiac clearance now received by PAT and placed on chart

## 2021-05-01 NOTE — Patient Instructions (Signed)
Your procedure is scheduled on: Wednesday May 08, 2021. Report to Day Surgery inside [facility] 2nd floor. To find out your arrival time please call [phone] between 1PM - 3PM on Tuesday May 07, 2021.  Remember: Instructions that are not followed completely may result in serious medical risk,  up to and including death, or upon the discretion of your surgeon and anesthesiologist your  surgery may need to be rescheduled.     _X__ 1. Do not eat food or drink fluids after midnight the night before your procedure.                 No chewing gum or hard candies.   __X__2.  On the morning of surgery brush your teeth with toothpaste and water, you                may rinse your mouth with mouthwash if you wish.  Do not swallow any toothpaste or mouthwash.     _X__ 3.  No Alcohol for 24 hours before or after surgery.   _X__ 4.  Do Not Smoke or use e-cigarettes For 24 Hours Prior to Your Surgery.                 Do not use any chewable tobacco products for at least 6 hours prior to                 Surgery.  _X__  5.  Do not use any recreational drugs (marijuana, cocaine, heroin, ecstasy, MDMA or other)                For at least one week prior to your surgery.  Combination of these drugs with anesthesia                May have life threatening results.  ____  6.  Bring all medications with you on the day of surgery if instructed.   __X__  7.  Notify your doctor if there is any change in your medical condition      (cold, fever, infections).     Do not wear jewelry, make-up, hairpins, clips or nail polish. Do not wear lotions, powders, or perfumes. You may wear deodorant. Do not shave 48 hours prior to surgery. Men may shave face and neck. Do not bring valuables to the hospital.    [facility] is not responsible for any belongings or valuables.  Contacts, dentures or bridgework may not be worn into surgery. Leave your suitcase in the car. After  surgery it may be brought to your room. For patients admitted to the hospital, discharge time is determined by your treatment team.   Patients discharged the day of surgery will not be allowed to drive home.   Make arrangements for someone to be with you for the first 24 hours of your Same Day Discharge.   __X__ Take these medicines the morning of surgery with A SIP OF WATER:    1. buPROPion ER (WELLBUTRIN SR) 100 MG  2. metoprolol tartrate (LOPRESSOR) 25 MG   3. levothyroxine (SYNTHROID) 137 MCG   4. omeprazole (PRILOSEC) 20 MG  5. venlafaxine (EFFEXOR) 75 MG   6.  ____ Fleet Enema (as directed)   __X__ Use CHG Soap (or wipes) as directed  ____ Use Benzoyl Peroxide Gel as instructed  __X__ Use inhalers on the day of surgery  albuterol (VENTOLIN HFA) 108 (90 Base) MCG/ACT inhaler  fluticasone-salmeterol (WIXELA INHUB) 250-50 MCG/ACT AEPB  __X__ Stop  metFORMIN (GLUCOPHAGE) 500 MG 2 days prior to surgery (take last dose  Sunday 05/05/21)  __X__ Stop empagliflozin (JARDIANCE) 10 MG 3 days prior to surgery (take last dose Saturday 05/04/21)   ____ Take 1/2 of usual insulin dose the night before surgery. No insulin the morning          of surgery.   __X__ Stop apixaban (ELIQUIS) 5 MG 2 days prior to surgery per your Doctor's instructions. (Take last dose Sunday night)  ____ One Week prior to surgery- Stop Anti-inflammatories such as Ibuprofen, Aleve, Advil, Motrin, meloxicam (MOBIC), diclofenac, etodolac, ketorolac, Toradol, Daypro, piroxicam, Goody's or BC powders. OK TO USE TYLENOL IF NEEDED   __X__ One week prior to surgery stop ALL supplements until after surgery. Omega-3 Fatty Acids (FISH OIL) 1000 MG   ____ Bring C-Pap to the hospital.    If you have any questions regarding your pre-procedure instructions,  Please call Pre-admit Testing at [phone]

## 2021-05-01 NOTE — Pre-Procedure Instructions (Signed)
Progress Notes - documented in this encounter Christine Obrien, [place] - 12/11/2020 2:30 PM EDT Formatting of this note is different from the original. Images from the original note were not included. Established Patient Visit   Chief Complaint: Chief Complaint  Patient presents with   Shortness of Breath  With exertion   Ankle Swelling  BIL; Occas   Foot Swelling  BIL; Occas   Leg Swelling  BIL; Occas   Dizziness  Occas  Date of Service: 12/11/2020 Date of Birth: January 29, 1941 PCP: Dennison Nancy, MD  History of Present Illness: [name] is a 81 y.o.female patient with a past medical history of coronary artery disease s/p CABG with LIMA to LAD, SVG to OM1 08/4490, chronic systolic CHF with a global EF of 35% s/p ICD placement, paroxysmal atrial fibrillation on Eliquis, left bundle branch block, history of NSTEMI, hyperlipidemia, type 2 diabetes, carotid stenosis.  Patient is here today for routine 29-month follow-up visit. She states that she has been doing generally well over the last 4 months and endorses some shortness of breath with activity that has been present for many years and is currently stable at this time. Patient denies any lower extremity edema, chest pain, syncope, near syncope, dizziness at this time. Patient denies any medication side effects at this time. Patient is being maximally medically managed for severe coronary artery disease with beta-blocker, Eliquis, ARB, cholesterol therapy including a statin and Zetia with no new complaints of angina or anginal equivalent at this time. Patient gets routine ICD checks at home with adequate battery life reported on last interrogation  Past Medical and Surgical History  Past Medical History Past Medical History:  Diagnosis Date   Anxiety   Asthma without status asthmaticus, unspecified   Carotid bruit 2009  bilat Ultrasound on 08/25/06 50% stenosis bilaterally.   Cataract cortical, senile  bilateral   Cervical cancer  (CMS-HCC)   Chickenpox   COPD (chronic obstructive pulmonary disease) (CMS-HCC)   Coronary artery disease  Status post stents x 2 March 20, 2006. Cardiac cath, 08/27/07, patent native and stented vessels.   Coronary artery disease due to lipid rich plaque 07/05/2017   COVID-19 02/2020   Depression   Diabetes mellitus type 2, uncomplicated (CMS-HCC)   GERD (gastroesophageal reflux disease)   Gout   Heart attack (CMS-HCC) 03/30/2013   Hemorrhoids   Hiatal hernia   Hyperlipidemia   Hypertension   Hypothyroidism  hypothyroid   Kidney stones   Myocardial infarction (CMS-HCC) 03/20/06   Osteoporosis, post-menopausal   Presence of stent in coronary artery 07/06/2017  Result Narrative  Prox RCA to Mid RCA lesion is 30% stenosed.  Prox Cx to Mid Cx lesion is 85% stenosed.  Ost LAD lesion is 75% stenosed.  Ost LM lesion is 75% stenosed.  Mid LM lesion is 50% stenosed. Assessment The patient has had an acute non-ST elevation myocardial infarction with causes and risk factors including high blood pressure, high cholesterol and smoking. severe 2 vessel cor   Past Surgical History She has a past surgical history that includes Abdominal hysterectomy (1975); repair hiatal hernia (1980's); Cardiac catheterization (03/20/06); Cataract extraction (Bilateral, 08/20/09, 08/24/09); Miscarriage (1963, 1965); Childbirth (1964, 1970); Dilation and curettage, diagnostic / therapeutic (1968); bronchoscopy (1980); Goiter removed (1983); Tube left ear (1983); Cholecystectomy; colonoscopy (2004); colonoscopy (2008); Left Cataract Surgery. (1983); coronary artery bypass w/single artery graft (N/A, 07/08/2017); coronary artery bypass w/vein only (N/A, 0/01/711); application wound vac (N/A, 07/08/2017); Colonoscopy (07/14/2012); and Colonoscopy (01/11/2020).   Medications and  Allergies  Current Medications  Current Outpatient Medications on File Prior to Visit  Medication Sig Dispense Refill   acetaminophen (TYLENOL)  500 MG tablet Take 1,000 mg by mouth 2 (two) times daily as needed for Pain   ADVAIR DISKUS 250-50 mcg/dose diskus inhaler USE 1 INHALATION EVERY 12 HOURS 180 each 3   apixaban (ELIQUIS) 5 mg tablet Take 1 tablet (5 mg total) by mouth 2 (two) times daily 60 tablet 11   blood glucose diagnostic (FREESTYLE LITE STRIPS) test strip Use 3 (three) times daily Use as instructed. 200 each 12   buPROPion (WELLBUTRIN SR) 100 MG SR tablet TAKE 1 TABLET DAILY 30 tablet 11   ezetimibe (ZETIA) 10 mg tablet TAKE 1 TABLET DAILY (NEED FASTING LIVER / LIPID LABS FOR FURTHER REFILLS) 90 tablet 3   FEROSUL 325 mg (65 mg iron) tablet TAKE 1 TABLET(325 MG) BY MOUTH DAILY WITH BREAKFAST 90 tablet 1   fluticasone propionate (FLONASE) 50 mcg/actuation nasal spray SHAKE LIQUID AND USE 2 SPRAYS IN EACH NOSTRIL EVERY DAY 48 g 1   FUROsemide (LASIX) 20 MG tablet Take 1 tablet (20 mg total) by mouth once daily 90 tablet 4   gabapentin (NEURONTIN) 100 MG capsule Take 1 capsule by mouth once daily   glipiZIDE (GLUCOTROL) 5 MG tablet TAKE 1 TABLET TWICE A DAY 180 tablet 3   HYDROcodone-acetaminophen (NORCO) 5-325 mg tablet TAKE 1 TABLET BY MOUTH EVERY 6 HOURS FOR 5 DAYS AS NEEDED   JARDIANCE 10 mg tablet TAKE 1 TABLET DAILY WITH BREAKFAST 90 tablet 3   loratadine (CLARITIN) 10 mg tablet TAKE 1 TABLET(10 MG) BY MOUTH EVERY DAY 90 tablet 1   losartan (COZAAR) 25 MG tablet TAKE 1 TABLET DAILY 90 tablet 3   metFORMIN (GLUCOPHAGE-XR) 500 MG XR tablet TAKE 1 TABLET TWICE A DAY (MAY RESUME WITH EVENING DOSE ON FRIDAY 05/13/2019 PM) 180 tablet 3   metoprolol tartrate (LOPRESSOR) 25 MG tablet TAKE 1 TABLET(25 MG) BY MOUTH TWICE DAILY 180 tablet 3   multivitamin tablet Take 1 tablet by mouth once daily   omega-3 acid ethyl esters (LOVAZA) 1 gram capsule TAKE 2 CAPSULES TWICE A DAY 360 capsule 3   omeprazole (PRILOSEC) 20 MG DR capsule TAKE 1 CAPSULE TWICE A DAY 180 capsule 1   peg 400-propylene glycol, PF, (SYSTANE ULTRA) 0.4-0.3 %  ophthalmic drops Place 1 drop into both eyes as needed for Dry Eyes   rosuvastatin (CRESTOR) 5 MG tablet TAKE 1 TABLET(5 MG) BY MOUTH EVERY DAY 30 tablet 11   SYNTHROID 137 mcg tablet TAKE 1 TABLET DAILY ON AN EMPTY STOMACH WITH A GLASS OF WATER AT LEAST 30 TO 60 MINUTES BEFORE BREAKFAST 90 tablet 3   venlafaxine (EFFEXOR-XR) 75 MG XR capsule TAKE 1 CAPSULE(75 MG) BY MOUTH TWICE DAILY 180 capsule 0   No current facility-administered medications on file prior to visit.   Allergies: Ace inhibitors, Lipitor [atorvastatin], Statins-hmg-coa reductase inhibitors, and Sulfa (sulfonamide antibiotics)  Social and Family History  Social History reports that she has been smoking cigarettes. She has a 30.00 pack-year smoking history. She has never used smokeless tobacco. She reports previous drug use. She reports that she does not drink alcohol.  Family History Family History  Problem Relation Age of Onset   Myocardial Infarction (Heart attack) Mother 33   Diabetes type II Mother   Depression Mother   Diabetes type II Father   Myocardial Infarction (Heart attack) Father 16   High blood pressure (Hypertension) Sister  Coronary Artery Disease (Blocked arteries around heart) Brother 64  bypass x6   High blood pressure (Hypertension) Sister   Diabetes type II Sister   Arthritis Sister   Depression Sister   Diabetes type II Brother   High blood pressure (Hypertension) Brother   Cancer Brother 51  colon   GI problems Brother  colon   Cancer Brother 23  colon   Diabetes type II Brother   GI problems Brother  colon   Depression Daughter   ADD / ADHD Daughter   Depression Daughter   Anesthesia problems Neg Hx   Malignant hyperthermia Neg Hx   Review of Systems   Review of Systems  Positive for sob Negative for weight gain weight loss, weakness, vision change, hearing loss, cough, congestion, PND, orthopnea, heartburn, nausea, diaphoresis, vomiting, diarrhea, bloody stool, melena, stomach  pain, extremity pain, leg weakness, leg cramping, leg blood clots, headache, blackouts, nosebleed, trouble swallowing, mouth pain, urinary frequency, urination at night, muscle weakness, skin lesions, skin rashes, tingling ,ulcers, numbness, anxiety, and/or depression Physical Examination   Vitals:BP 120/62 (BP Location: Left upper arm, Patient Position: Sitting, BP Cuff Size: Adult)   Pulse 67   Resp 15   Ht 160 cm (5\' 3" )   Wt 61.6 kg (135 lb 12.8 oz)   SpO2 98%   BMI 24.06 kg/m  Ht:160 cm (5\' 3" ) Wt:61.6 kg (135 lb 12.8 oz) VWU:JWJX surface area is 1.65 meters squared. Body mass index is 24.06 kg/m. Appearance: well appearing in no acute distress HEENT: Pupils equally reactive to light and accomodation, no xanthalasma  Neck: Supple, no apparent thyromegaly, masses, or lymphadenopathy  Lungs: normal respiratory effort; no crackles, no rhonchi, no wheezes Heart: Regular rate and rhythm. Normal S1 S2 No gallops, murmur, no rub, PMI is normal size and placement. carotid upstroke normal without bruit. Jugular venous pressure is normal Abdomen: soft, nontender, not distended with normal bowel sounds. No apparent hepatosplenomegally. Abdominal aorta is normal size without bruit Extremities: no edema, no ulcers, no clubbing, no cyanosis Peripheral Pulses: 2+ in upper extremities, 2+ femoral pulses bilaterally, 2+lower extremity  Musculoskeletal; Normal muscle tone without kyphosis Neurological: Oriented and Alert, Cranial nerves intact  Assessment   81 y.o. female with  Encounter Diagnoses  Name Primary?   Chronic systolic CHF (congestive heart failure), NYHA class 3 (CMS-HCC) Yes   Paroxysmal A-fib (CMS-HCC)   ICD (implantable cardioverter-defibrillator) in place   LBBB (left bundle branch block)   S/P CABG x 2 (free LIMA to LAD, SVG to OM1)   Primary hypertension   Coronary artery disease involving native coronary artery of native heart without angina pectoris   History of non-ST  elevation myocardial infarction (NSTEMI)   Familial hypercholesterolemia   Type 2 diabetes mellitus without complication, unspecified whether long term insulin use (CMS-HCC)   Stenosis of right carotid artery   Plan   1. Coronary artery disease: Status post CABG x2 with LIMA to LAD, SVG to OM1. Patient is currently taking a combination of Eliquis, statin, Zetia, losartan, metoprolol for management of secondary ASCVD risk factors. Patient is not currently complaining of any angina or anginal equivalent type symptoms at this time and appears stable. Recent stress test and 07/09/2020 showed no evidence of myocardial ischemia. -Continue current medical therapy with no changes  2. Chronic systolic CHF NYHA class III: Patient had an echocardiogram in November 2021 which showed global EF of 35%. Patient is status post insertion of an ICD and gets regular interrogations with last interrogation  showing an adequate battery life of 8.8 years. Patient appears euvolemic at this time with no new complaints of worsening shortness of breath, lower extremity edema. -Continue losartan, metoprolol, Lasix for management of systolic CHF. -Continue with regular ICD interrogations  3. Paroxysmal atrial fibrillation: Patient has a history of paroxysmal atrial fibrillation for which she takes metoprolol for heart rate control and maintenance of normal sinus rhythm. The patient also currently takes Eliquis 5 mg for stroke risk reduction with atrial fibrillation. Patient currently denies any bleeding or bruising problems with the Eliquis or any increased palpitations with atrial fibrillation. -Continue metoprolol at current dose -Continue Eliquis 5 mg as patient is under 42 years old and has a weight over 60 kg this is the correct dose  4. Hypertension: Patient has a history of hypertension for which she is currently taking combination of losartan, metoprolol, Lasix for management. She is well controlled in the office today  at 120/62. Patient denies any side effects of the medications at this time. -No changes to medical therapy at this time  5. Hyperlipidemia: Patient is currently taking a combination of rosuvastatin 5 mg and Zetia 10 mg for management of hyperlipidemia due to side effects of statin therapy. Patient reports that she takes statins 3 times a week and the Zetia every day. -No changes to medical management at this time  No orders of the defined types were placed in this encounter.  Return in about 6 months (around 06/13/2021).  I personally performed the service, non-incident to. [facility])   Christine Booze, PA    Electronically signed by Christine Obrien, [place] at 12/11/2020 3:20 PM EDT  Plan of Treatment - documented as of this encounter Plan of Treatment - Upcoming Encounters Upcoming Encounters Date Type Specialty Care Team Description  06/13/2021 Office Visit Cardiology Flossie Dibble, MD   [address]   [facility]   [place], [place] [address]   [phone] (Work)   [phone] ([address]     Christine Obrien, [place], [place] [address]   [phone] (Work)   [phone] (Fax)       Goals - documented as of this encounter Goals Goal Patient Goal Type Associated Problems Recent Progress Patient-Stated? Author  Patient Goals   General     Yes [place], [place], [place]  Note:    Formatting of this note might be different from the original. declined   Visit Diagnoses - documented in this encounter Visit Diagnoses Diagnosis  Chronic systolic CHF (congestive heart failure), NYHA class 3 (CMS-HCC) - Primary    Paroxysmal A-fib (CMS-HCC)    ICD (implantable cardioverter-defibrillator) in place    LBBB (left bundle branch block)   Other left bundle branch block    S/P CABG x 2 (free LIMA to LAD, SVG to OM1)   Postsurgical aortocoronary bypass status    Primary hypertension   Unspecified essential hypertension    Coronary artery disease  involving native coronary artery of native heart without angina pectoris    History of non-ST elevation myocardial infarction (NSTEMI)    Familial hypercholesterolemia    Type 2 diabetes mellitus without complication, unspecified whether long term insulin use (CMS-HCC)    Stenosis of right carotid artery   Occlusion and stenosis of carotid artery without mention of cerebral infarction     Discontinued Medications - documented as of this encounter Discontinued Medications Medication Sig Discontinue Reason Start Date End Date  doxepin (SINEQUAN) 10 MG capsule  TAKE 1 TO 2 CAPSULES BY MOUTH EVERY NIGHT AS NEEDED   06/21/2018 12/11/2020   Historical Medications - added in this encounter This list may reflect changes made after this encounter.  Historical Medications Medication Sig Dispensed Refills Start Date End Date  HYDROcodone-acetaminophen (NORCO) 5-325 mg tablet   TAKE 1 TABLET BY MOUTH EVERY 6 HOURS FOR 5 DAYS AS NEEDED   0 11/28/2020    gabapentin (NEURONTIN) 100 MG capsule   Take 1 capsule by mouth once daily   0 08/23/2020     Care Teams - documented as of this encounter Care Teams Team Member Relationship Specialty Start Date End Date  Lovie Macadamia Fuller [place], MD   [address]   [place], [place] [address]   [phone] (Work)   [phone] (Fax)   PCP - General   05/23/11    Flossie Dibble, [place]   [facility]   [place], [place] [address]   [phone] (Work)   [phone] (Fax)   Cardiologist Cardiovascular Disease 07/28/17     Images Patient Demographics  Patient Demographics Patient Address Communication Language Race / Ethnicity Marital Status  [address] [facility]) [place], [place] [address]  Former (Sep 06, 2010 - Dec. 15, 2016): [facility]) [place], [place] [address] [phone] [facility]) [phone] (Mobile) [email or web address] English (Preferred) White / Not Hispanic or Latino Widowed   Patient  Contacts  Patient Contacts Contact Name Contact Address Communication Relationship to Patient  Edwinna Areola [address] [place], [place] [address] [phone] [facility]) [phone] [facility]) Son or Daughter, Emergency Contact  Mady Gemma Unknown [phone] [facility]) Son or Daughter, Emergency Contact   Document Information  Primary Care Provider Other Service Providers Document Coverage Dates  Dennison Nancy, MD (Feb. 01, 2013February 01, 2013 - Present) [phone] (Work) [phone] (Fax) [address], [place] [address] Family Medicine [facility] [place], [place] [address] Flossie Dibble, MD (Cardiologist) [phone] (Work) [phone] (Fax) [address] [facility] [place], [place] [address] Cardiovascular Disease [facility] [address] [place], [place] [address] Aug. 23, 2022August 23, 2022   [place] [address] [place], [place] [address]   Encounter Providers Encounter Date  Christine Obrien, [place] (Attending) [phone] (Work) [phone] (Fax) [place],  [address] Cardiovascular Disease Aug. 23, 2022August 23, 2022   Legal Authenticator   Associate Chief Health Information Officer

## 2021-05-02 ENCOUNTER — Encounter
Admission: RE | Admit: 2021-05-02 | Discharge: 2021-05-02 | Disposition: A | Discharge status: Home / Self Care | Payer: Medicare Other | Source: Ambulatory Visit | Attending: Vascular Surgery | Admitting: Vascular Surgery

## 2021-05-02 DIAGNOSIS — Z01812 Encounter for preprocedural laboratory examination: Secondary | ICD-10-CM | POA: Insufficient documentation

## 2021-05-02 DIAGNOSIS — Z01810 Encounter for preprocedural cardiovascular examination: Secondary | ICD-10-CM | POA: Diagnosis not present

## 2021-05-02 DIAGNOSIS — I6523 Occlusion and stenosis of bilateral carotid arteries: Secondary | ICD-10-CM | POA: Insufficient documentation

## 2021-05-02 LAB — CBC WITH DIFFERENTIAL/PLATELET
Abs Immature Granulocytes: 0.02 10*3/uL (ref 0.00–0.07)
Basophils Absolute: 0 10*3/uL (ref 0.0–0.1)
Basophils Relative: 0 %
Eosinophils Absolute: 0.2 10*3/uL (ref 0.0–0.5)
Eosinophils Relative: 3 %
HCT: 42.9 % (ref 36.0–46.0)
Hemoglobin: 14.2 g/dL (ref 12.0–15.0)
Immature Granulocytes: 0 %
Lymphocytes Relative: 33 %
Lymphs Abs: 2.2 10*3/uL (ref 0.7–4.0)
MCH: 31.2 pg (ref 26.0–34.0)
MCHC: 33.1 g/dL (ref 30.0–36.0)
MCV: 94.3 fL (ref 80.0–100.0)
Monocytes Absolute: 0.3 10*3/uL (ref 0.1–1.0)
Monocytes Relative: 5 %
Neutro Abs: 4 10*3/uL (ref 1.7–7.7)
Neutrophils Relative %: 59 %
Platelets: 213 10*3/uL (ref 150–400)
RBC: 4.55 MIL/uL (ref 3.87–5.11)
RDW: 13 % (ref 11.5–15.5)
WBC: 6.8 10*3/uL (ref 4.0–10.5)
nRBC: 0 % (ref 0.0–0.2)

## 2021-05-02 LAB — BASIC METABOLIC PANEL
Anion gap: 9 (ref 5–15)
BUN: 21 mg/dL (ref 8–23)
CO2: 29 mmol/L (ref 22–32)
Calcium: 9.8 mg/dL (ref 8.9–10.3)
Chloride: 98 mmol/L (ref 98–111)
Creatinine, Ser: 1.29 mg/dL — ABNORMAL HIGH (ref 0.44–1.00)
GFR, Estimated: 42 mL/min — ABNORMAL LOW (ref >60)
Glucose, Bld: 104 mg/dL — ABNORMAL HIGH (ref 70–99)
Potassium: 3.7 mmol/L (ref 3.5–5.1)
Sodium: 136 mmol/L (ref 135–145)

## 2021-05-02 LAB — TYPE AND SCREEN
ABO/RH(D): O NEG
Antibody Screen: NEGATIVE

## 2021-05-02 NOTE — Pre-Procedure Instructions (Signed)
NM myocardial perfusion SPECT multiple (stress and rest)  Anatomical Region Laterality Modality  Chest -- Nuclear Medicine   Impression  Indeterminate Lexiscan infusion EKG due to baseline EKG changes  Global LV systolic dysfunction with ejection fraction of 33%  Normal myocardial perfusion without evidence of myocardial ischemia   Christine Obrien Narrative  [place]  [facility]  [address] Christine Obrien Christine, Obrien  [address]  [phone]   Procedure: Pharmacologic Myocardial Perfusion Imaging    ONE day procedure   Indication: Coronary artery disease of native artery of native heart with  stable angina pectoris (CMS-HCC)  Plan: NM myocardial perfusion SPECT multiple (stress        and rest), ECG stress test only   Chronic systolic CHF (congestive heart failure), NYHA class 3 (CMS-HCC)  Plan: NM myocardial perfusion SPECT multiple (stress        and rest), ECG stress test only   Ordering Physician:   Dr. [name]    Clinical History:  81 y.o. year old female  Vitals: Height: 71 in Weight: 144 lb  Cardiac risk factors include:     CHF, Smoking, Hyperlipidemia, Previous MI, Diabetes, HTN, CABG and CAD    Procedure:   Pharmacologic stress testing was performed with Regadenoson using a single  use 0.4mg /69ml (0.08 mg/ml) prefilled syringe intravenously infused as a  bolus dose over 10-15 seconds. The stress test was stopped due to Infusion  completion.  Blood pressure response was hypotensive. The patient did not  develop any symptoms other than fatigue during the procedure.   Rest HR: 68bpm  Rest BP: 110/38mmHg  Max HR: 82bpm  Min BP: 94/46mmHg   Stress Test Administered by: Everitt Amber CMA   ECG Interpretation:  Rest ECG:  normal sinus rhythm, left bundle branch block (LBBB)  Stress ECG:  normal sinus rhythm, left bundle branch block  Recovery ECG:  normal sinus rhythm  ECG Interpretation:   non-diagnostic due to pharmacologic testing.    Administrations This Visit     regadenoson (LEXISCAN) 0.4 mg/5 mL inj syringe 0.4 mg     Admin Date  07/09/2020 Action  Given Dose  0.4 mg Route  Intravenous Administered By  Herbert Seta, CNMT     technetium Tc46m sestamibi (CARDIOLITE) injection 12.87 millicurie     Admin Date  07/09/2020 Action  Given Dose  86.76 millicurie Route  Intravenous Administered By  Herbert Seta, CNMT     technetium Tc92m sestamibi (CARDIOLITE) injection 72.0 millicurie     Admin Date  07/09/2020 Action  Given Dose  94.7 millicurie Route  Intravenous Administered By  Herbert Seta, CNMT       Gated post-stress perfusion imaging was performed 30 minutes after stress.  Rest images were performed 30 minutes after injection.   Gated LV Analysis:   Summary of LV Perfusion: Normal,   Summary of LV Function: Abnormal    TID Ratio: 0.75   LVEF= 33%   FINDINGS:  Regional wall motion:  demonstrates  hypokinesis of the Of all myocardial  walls.  The overall quality of the study is good.    Artifacts noted: no  Left ventricular cavity: normal.   Perfusion Analysis:  SPECT images demonstrate homogeneous tracer  distribution throughout the myocardium. Defect type : Normal   Exam End: 07/09/20 14:45 Last Resulted: 07/11/20 17:05  Received From: [facility]  Result Received: 07/13/20 12:01   View Encounter

## 2021-05-03 NOTE — Pre-Procedure Instructions (Signed)
Pacemaker form received and placed on chart

## 2021-05-06 ENCOUNTER — Other Ambulatory Visit: Payer: Self-pay

## 2021-05-06 ENCOUNTER — Other Ambulatory Visit
Admission: RE | Admit: 2021-05-06 | Discharge: 2021-05-06 | Disposition: A | Discharge status: Home / Self Care | Payer: Medicare Other | Source: Ambulatory Visit | Attending: Vascular Surgery | Admitting: Vascular Surgery

## 2021-05-06 DIAGNOSIS — Z01812 Encounter for preprocedural laboratory examination: Secondary | ICD-10-CM | POA: Insufficient documentation

## 2021-05-06 DIAGNOSIS — Z20822 Contact with and (suspected) exposure to covid-19: Secondary | ICD-10-CM | POA: Insufficient documentation

## 2021-05-07 LAB — SARS CORONAVIRUS 2 (TAT 6-24 HRS): SARS Coronavirus 2: NEGATIVE

## 2021-05-07 MED ORDER — CHLORHEXIDINE GLUCONATE 0.12 % MT SOLN: 15.0000 mL | Freq: Once | OROMUCOSAL | Status: AC

## 2021-05-07 MED ORDER — CEFAZOLIN SODIUM-DEXTROSE 2-4 GM/100ML-% IV SOLN
2.0000 g | INTRAVENOUS | Status: AC
  Administered 2021-05-08: 2 g via INTRAVENOUS

## 2021-05-07 MED ORDER — SODIUM CHLORIDE 0.9 % IV SOLN: INTRAVENOUS | Status: DC

## 2021-05-07 MED ORDER — ORAL CARE MOUTH RINSE: 15.0000 mL | Freq: Once | OROMUCOSAL | Status: AC

## 2021-05-07 MED ORDER — CHLORHEXIDINE GLUCONATE CLOTH 2 % EX PADS
6.0000 | MEDICATED_PAD | Freq: Once | CUTANEOUS | Status: AC
  Administered 2021-05-08: 6 via TOPICAL

## 2021-05-08 ENCOUNTER — Other Ambulatory Visit: Payer: Self-pay

## 2021-05-08 ENCOUNTER — Inpatient Hospital Stay: Payer: Medicare Other | Admitting: Certified Registered"

## 2021-05-08 ENCOUNTER — Encounter: Payer: Self-pay | Admitting: Vascular Surgery

## 2021-05-08 ENCOUNTER — Inpatient Hospital Stay
Admission: RE | Admit: 2021-05-08 | Discharge: 2021-05-09 | DRG: 038 | Disposition: A | Discharge status: Home / Self Care | Payer: Medicare Other | Attending: Nurse Practitioner | Admitting: Nurse Practitioner

## 2021-05-08 ENCOUNTER — Encounter
Admission: RE | Disposition: A | Discharge status: Home / Self Care | Payer: Self-pay | Source: Home / Self Care | Attending: Vascular Surgery

## 2021-05-08 DIAGNOSIS — Z9049 Acquired absence of other specified parts of digestive tract: Secondary | ICD-10-CM

## 2021-05-08 DIAGNOSIS — Z888 Allergy status to other drugs, medicaments and biological substances status: Secondary | ICD-10-CM

## 2021-05-08 DIAGNOSIS — I130 Hypertensive heart and chronic kidney disease with heart failure and stage 1 through stage 4 chronic kidney disease, or unspecified chronic kidney disease: Secondary | ICD-10-CM | POA: Diagnosis present

## 2021-05-08 DIAGNOSIS — I2510 Atherosclerotic heart disease of native coronary artery without angina pectoris: Secondary | ICD-10-CM | POA: Diagnosis present

## 2021-05-08 DIAGNOSIS — E1122 Type 2 diabetes mellitus with diabetic chronic kidney disease: Secondary | ICD-10-CM | POA: Diagnosis present

## 2021-05-08 DIAGNOSIS — E1151 Type 2 diabetes mellitus with diabetic peripheral angiopathy without gangrene: Secondary | ICD-10-CM | POA: Diagnosis present

## 2021-05-08 DIAGNOSIS — Z87442 Personal history of urinary calculi: Secondary | ICD-10-CM | POA: Diagnosis not present

## 2021-05-08 DIAGNOSIS — Z7901 Long term (current) use of anticoagulants: Secondary | ICD-10-CM

## 2021-05-08 DIAGNOSIS — E039 Hypothyroidism, unspecified: Secondary | ICD-10-CM | POA: Diagnosis present

## 2021-05-08 DIAGNOSIS — R9431 Abnormal electrocardiogram [ECG] [EKG]: Secondary | ICD-10-CM | POA: Diagnosis present

## 2021-05-08 DIAGNOSIS — Z882 Allergy status to sulfonamides status: Secondary | ICD-10-CM

## 2021-05-08 DIAGNOSIS — Z79890 Hormone replacement therapy (postmenopausal): Secondary | ICD-10-CM

## 2021-05-08 DIAGNOSIS — Z9861 Coronary angioplasty status: Secondary | ICD-10-CM

## 2021-05-08 DIAGNOSIS — I6521 Occlusion and stenosis of right carotid artery: Principal | ICD-10-CM | POA: Diagnosis present

## 2021-05-08 DIAGNOSIS — Z809 Family history of malignant neoplasm, unspecified: Secondary | ICD-10-CM | POA: Diagnosis not present

## 2021-05-08 DIAGNOSIS — F17210 Nicotine dependence, cigarettes, uncomplicated: Secondary | ICD-10-CM | POA: Diagnosis present

## 2021-05-08 DIAGNOSIS — Z8249 Family history of ischemic heart disease and other diseases of the circulatory system: Secondary | ICD-10-CM | POA: Diagnosis not present

## 2021-05-08 DIAGNOSIS — Z95810 Presence of automatic (implantable) cardiac defibrillator: Secondary | ICD-10-CM

## 2021-05-08 DIAGNOSIS — Z20822 Contact with and (suspected) exposure to covid-19: Secondary | ICD-10-CM | POA: Diagnosis present

## 2021-05-08 DIAGNOSIS — Z7984 Long term (current) use of oral hypoglycemic drugs: Secondary | ICD-10-CM | POA: Diagnosis not present

## 2021-05-08 DIAGNOSIS — E785 Hyperlipidemia, unspecified: Secondary | ICD-10-CM | POA: Diagnosis present

## 2021-05-08 DIAGNOSIS — I252 Old myocardial infarction: Secondary | ICD-10-CM

## 2021-05-08 DIAGNOSIS — Z7902 Long term (current) use of antithrombotics/antiplatelets: Secondary | ICD-10-CM | POA: Diagnosis not present

## 2021-05-08 DIAGNOSIS — I447 Left bundle-branch block, unspecified: Secondary | ICD-10-CM | POA: Diagnosis present

## 2021-05-08 DIAGNOSIS — F419 Anxiety disorder, unspecified: Secondary | ICD-10-CM | POA: Diagnosis present

## 2021-05-08 DIAGNOSIS — Z951 Presence of aortocoronary bypass graft: Secondary | ICD-10-CM

## 2021-05-08 DIAGNOSIS — Z7951 Long term (current) use of inhaled steroids: Secondary | ICD-10-CM | POA: Diagnosis not present

## 2021-05-08 DIAGNOSIS — Z79899 Other long term (current) drug therapy: Secondary | ICD-10-CM

## 2021-05-08 DIAGNOSIS — Z833 Family history of diabetes mellitus: Secondary | ICD-10-CM

## 2021-05-08 DIAGNOSIS — F32A Depression, unspecified: Secondary | ICD-10-CM | POA: Diagnosis present

## 2021-05-08 DIAGNOSIS — I429 Cardiomyopathy, unspecified: Secondary | ICD-10-CM | POA: Diagnosis present

## 2021-05-08 DIAGNOSIS — J449 Chronic obstructive pulmonary disease, unspecified: Secondary | ICD-10-CM | POA: Diagnosis present

## 2021-05-08 DIAGNOSIS — K219 Gastro-esophageal reflux disease without esophagitis: Secondary | ICD-10-CM | POA: Diagnosis present

## 2021-05-08 DIAGNOSIS — Z90710 Acquired absence of both cervix and uterus: Secondary | ICD-10-CM

## 2021-05-08 DIAGNOSIS — N183 Chronic kidney disease, stage 3 unspecified: Secondary | ICD-10-CM | POA: Diagnosis present

## 2021-05-08 HISTORY — PX: ENDARTERECTOMY: SHX5162

## 2021-05-08 LAB — GLUCOSE, CAPILLARY
Glucose-Capillary: 244 mg/dL — ABNORMAL HIGH (ref 70–99)
Glucose-Capillary: 166 mg/dL — ABNORMAL HIGH (ref 70–99)
Glucose-Capillary: 160 mg/dL — ABNORMAL HIGH (ref 70–99)
Glucose-Capillary: 211 mg/dL — ABNORMAL HIGH (ref 70–99)
Glucose-Capillary: 191 mg/dL — ABNORMAL HIGH (ref 70–99)

## 2021-05-08 LAB — HEMOGLOBIN A1C
Hgb A1c MFr Bld: 7.1 % — ABNORMAL HIGH (ref 4.8–5.6)
Mean Plasma Glucose: 157.07 mg/dL

## 2021-05-08 LAB — MRSA NEXT GEN BY PCR, NASAL: MRSA by PCR Next Gen: NOT DETECTED

## 2021-05-08 SURGERY — ENDARTERECTOMY, CAROTID
Anesthesia: General | Laterality: Right

## 2021-05-08 MED ORDER — EMPAGLIFLOZIN 10 MG PO TABS
10.0000 mg | ORAL_TABLET | Freq: Every day | ORAL | Status: DC
  Administered 2021-05-08 – 2021-05-09 (×2): 10 mg via ORAL
  Filled 2021-05-08 (×2): qty 1

## 2021-05-08 MED ORDER — SODIUM CHLORIDE 0.9 % IV SOLN: INTRAVENOUS | Status: DC

## 2021-05-08 MED ORDER — FIBRIN SEALANT 2 ML SINGLE DOSE KIT
PACK | CUTANEOUS | Status: DC | PRN
  Administered 2021-05-08: 2 mL via TOPICAL

## 2021-05-08 MED ORDER — ROSUVASTATIN CALCIUM 10 MG PO TABS
5.0000 mg | ORAL_TABLET | Freq: Every day | ORAL | Status: DC
  Administered 2021-05-08 – 2021-05-09 (×2): 5 mg via ORAL
  Filled 2021-05-08 (×2): qty 1

## 2021-05-08 MED ORDER — OXYCODONE HCL 5 MG PO TABS: 5.0000 mg | ORAL_TABLET | Freq: Once | ORAL | Status: DC | PRN

## 2021-05-08 MED ORDER — OXYCODONE-ACETAMINOPHEN 5-325 MG PO TABS: 1.0000 | ORAL_TABLET | ORAL | Status: DC | PRN

## 2021-05-08 MED ORDER — LIDOCAINE HCL 1 % IJ SOLN
INTRAMUSCULAR | Status: DC | PRN
  Administered 2021-05-08: 10 mL

## 2021-05-08 MED ORDER — SODIUM CHLORIDE 0.9 % IV SOLN
INTRAVENOUS | Status: DC | PRN
  Administered 2021-05-08: 100 mL via INTRAMUSCULAR

## 2021-05-08 MED ORDER — DOCUSATE SODIUM 100 MG PO CAPS
100.0000 mg | ORAL_CAPSULE | Freq: Every day | ORAL | Status: DC
  Administered 2021-05-09: 100 mg via ORAL
  Filled 2021-05-08: qty 1

## 2021-05-08 MED ORDER — LORATADINE 10 MG PO TABS
10.0000 mg | ORAL_TABLET | Freq: Every day | ORAL | Status: DC
  Administered 2021-05-08 – 2021-05-09 (×2): 10 mg via ORAL
  Filled 2021-05-08 (×2): qty 1

## 2021-05-08 MED ORDER — HEPARIN SODIUM (PORCINE) 1000 UNIT/ML IJ SOLN
INTRAMUSCULAR | Status: AC
  Filled 2021-05-08: qty 10

## 2021-05-08 MED ORDER — FLUTICASONE PROPIONATE 50 MCG/ACT NA SUSP
2.0000 | Freq: Every day | NASAL | Status: DC
  Administered 2021-05-08 – 2021-05-09 (×2): 2 via NASAL
  Filled 2021-05-08: qty 16

## 2021-05-08 MED ORDER — ALBUTEROL SULFATE (2.5 MG/3ML) 0.083% IN NEBU: 3.0000 mL | INHALATION_SOLUTION | RESPIRATORY_TRACT | Status: DC | PRN

## 2021-05-08 MED ORDER — LIDOCAINE HCL (PF) 2 % IJ SOLN
INTRAMUSCULAR | Status: AC
  Filled 2021-05-08: qty 5

## 2021-05-08 MED ORDER — PROPOFOL 10 MG/ML IV BOLUS
INTRAVENOUS | Status: DC | PRN
  Administered 2021-05-08: 50 mg via INTRAVENOUS

## 2021-05-08 MED ORDER — POTASSIUM CHLORIDE CRYS ER 20 MEQ PO TBCR: 20.0000 meq | EXTENDED_RELEASE_TABLET | Freq: Every day | ORAL | Status: DC | PRN

## 2021-05-08 MED ORDER — HYDROMORPHONE HCL 1 MG/ML IJ SOLN
1.0000 mg | Freq: Once | INTRAMUSCULAR | Status: AC | PRN
  Administered 2021-05-08: 1 mg via INTRAVENOUS
  Filled 2021-05-08: qty 1

## 2021-05-08 MED ORDER — INSULIN ASPART 100 UNIT/ML IJ SOLN
INTRAMUSCULAR | Status: AC
  Filled 2021-05-08: qty 1

## 2021-05-08 MED ORDER — GUAIFENESIN-DM 100-10 MG/5ML PO SYRP: 15.0000 mL | ORAL_SOLUTION | ORAL | Status: DC | PRN

## 2021-05-08 MED ORDER — ONDANSETRON HCL 4 MG/2ML IJ SOLN: 4.0000 mg | Freq: Four times a day (QID) | INTRAMUSCULAR | Status: DC | PRN

## 2021-05-08 MED ORDER — GLIPIZIDE 5 MG PO TABS
5.0000 mg | ORAL_TABLET | Freq: Every day | ORAL | Status: DC
  Administered 2021-05-09: 5 mg via ORAL
  Filled 2021-05-08: qty 1

## 2021-05-08 MED ORDER — DEXAMETHASONE SODIUM PHOSPHATE 10 MG/ML IJ SOLN
INTRAMUSCULAR | Status: DC | PRN
  Administered 2021-05-08: 5 mg via INTRAVENOUS

## 2021-05-08 MED ORDER — ACETAMINOPHEN 500 MG PO TABS
500.0000 mg | ORAL_TABLET | Freq: Four times a day (QID) | ORAL | Status: DC | PRN
  Administered 2021-05-09: 500 mg via ORAL

## 2021-05-08 MED ORDER — LIDOCAINE HCL (CARDIAC) PF 100 MG/5ML IV SOSY
PREFILLED_SYRINGE | INTRAVENOUS | Status: DC | PRN
  Administered 2021-05-08: 60 mg via INTRAVENOUS

## 2021-05-08 MED ORDER — CHLORHEXIDINE GLUCONATE 0.12 % MT SOLN
OROMUCOSAL | Status: AC
  Administered 2021-05-08: 15 mL via OROMUCOSAL
  Filled 2021-05-08: qty 15

## 2021-05-08 MED ORDER — VENLAFAXINE HCL 37.5 MG PO TABS
75.0000 mg | ORAL_TABLET | Freq: Two times a day (BID) | ORAL | Status: DC
  Administered 2021-05-08 – 2021-05-09 (×2): 75 mg via ORAL
  Filled 2021-05-08 (×3): qty 2

## 2021-05-08 MED ORDER — SEVOFLURANE IN SOLN
RESPIRATORY_TRACT | Status: AC
  Filled 2021-05-08: qty 250

## 2021-05-08 MED ORDER — PHENOL 1.4 % MT LIQD
1.0000 | OROMUCOSAL | Status: DC | PRN
  Filled 2021-05-08: qty 177

## 2021-05-08 MED ORDER — BUPROPION HCL ER (SR) 100 MG PO TB12
100.0000 mg | ORAL_TABLET | Freq: Every day | ORAL | Status: DC
  Administered 2021-05-08 – 2021-05-09 (×2): 100 mg via ORAL
  Filled 2021-05-08 (×2): qty 1

## 2021-05-08 MED ORDER — ASPIRIN EC 81 MG PO TBEC
81.0000 mg | DELAYED_RELEASE_TABLET | Freq: Every day | ORAL | Status: DC
  Administered 2021-05-09: 81 mg via ORAL
  Filled 2021-05-08: qty 1

## 2021-05-08 MED ORDER — CEFAZOLIN SODIUM-DEXTROSE 2-4 GM/100ML-% IV SOLN
2.0000 g | Freq: Three times a day (TID) | INTRAVENOUS | Status: AC
  Administered 2021-05-08 (×2): 2 g via INTRAVENOUS
  Filled 2021-05-08 (×2): qty 100

## 2021-05-08 MED ORDER — EZETIMIBE 10 MG PO TABS
10.0000 mg | ORAL_TABLET | Freq: Every day | ORAL | Status: DC
  Administered 2021-05-08 – 2021-05-09 (×2): 10 mg via ORAL
  Filled 2021-05-08 (×2): qty 1

## 2021-05-08 MED ORDER — PHENYLEPHRINE 40 MCG/ML (10ML) SYRINGE FOR IV PUSH (FOR BLOOD PRESSURE SUPPORT)
PREFILLED_SYRINGE | INTRAVENOUS | Status: DC | PRN
  Administered 2021-05-08: 160 ug via INTRAVENOUS
  Administered 2021-05-08 (×5): 100 ug via INTRAVENOUS
  Administered 2021-05-08: 80 ug via INTRAVENOUS
  Administered 2021-05-08: 100 ug via INTRAVENOUS
  Administered 2021-05-08: 200 ug via INTRAVENOUS
  Administered 2021-05-08: 80 ug via INTRAVENOUS

## 2021-05-08 MED ORDER — METFORMIN HCL 500 MG PO TABS
500.0000 mg | ORAL_TABLET | Freq: Two times a day (BID) | ORAL | Status: DC
  Administered 2021-05-08 – 2021-05-09 (×2): 500 mg via ORAL
  Filled 2021-05-08 (×3): qty 1

## 2021-05-08 MED ORDER — FENTANYL CITRATE (PF) 100 MCG/2ML IJ SOLN
25.0000 ug | INTRAMUSCULAR | Status: DC | PRN
  Administered 2021-05-08: 25 ug via INTRAVENOUS

## 2021-05-08 MED ORDER — MORPHINE SULFATE (PF) 2 MG/ML IV SOLN: 2.0000 mg | INTRAVENOUS | Status: DC | PRN

## 2021-05-08 MED ORDER — FUROSEMIDE 20 MG PO TABS
20.0000 mg | ORAL_TABLET | Freq: Every day | ORAL | Status: DC
Start: 2021-05-08 — End: 2021-05-09
  Administered 2021-05-08 – 2021-05-09 (×2): 20 mg via ORAL
  Filled 2021-05-08 (×2): qty 1

## 2021-05-08 MED ORDER — INSULIN ASPART 100 UNIT/ML IJ SOLN
5.0000 [IU] | Freq: Once | INTRAMUSCULAR | Status: AC
  Administered 2021-05-08: 5 [IU] via SUBCUTANEOUS

## 2021-05-08 MED ORDER — PANTOPRAZOLE SODIUM 40 MG PO TBEC
40.0000 mg | DELAYED_RELEASE_TABLET | Freq: Every day | ORAL | Status: DC
  Administered 2021-05-09: 40 mg via ORAL
  Filled 2021-05-08: qty 1

## 2021-05-08 MED ORDER — METOPROLOL TARTRATE 25 MG PO TABS
25.0000 mg | ORAL_TABLET | Freq: Two times a day (BID) | ORAL | Status: DC
  Administered 2021-05-08 – 2021-05-09 (×3): 25 mg via ORAL
  Filled 2021-05-08 (×3): qty 1

## 2021-05-08 MED ORDER — FERROUS SULFATE 325 (65 FE) MG PO TABS
325.0000 mg | ORAL_TABLET | Freq: Every day | ORAL | Status: DC
  Administered 2021-05-09: 325 mg via ORAL
  Filled 2021-05-08: qty 1

## 2021-05-08 MED ORDER — HEPARIN SODIUM (PORCINE) 1000 UNIT/ML IJ SOLN
INTRAMUSCULAR | Status: DC | PRN
  Administered 2021-05-08: 5000 [IU] via INTRAVENOUS

## 2021-05-08 MED ORDER — FAMOTIDINE IN NACL 20-0.9 MG/50ML-% IV SOLN
20.0000 mg | Freq: Two times a day (BID) | INTRAVENOUS | Status: DC
  Administered 2021-05-08 (×2): 20 mg via INTRAVENOUS
  Filled 2021-05-08 (×2): qty 50

## 2021-05-08 MED ORDER — CLOPIDOGREL BISULFATE 75 MG PO TABS
75.0000 mg | ORAL_TABLET | Freq: Every day | ORAL | Status: DC
  Administered 2021-05-08 – 2021-05-09 (×2): 75 mg via ORAL
  Filled 2021-05-08 (×2): qty 1

## 2021-05-08 MED ORDER — ONDANSETRON HCL 4 MG/2ML IJ SOLN
INTRAMUSCULAR | Status: DC | PRN
  Administered 2021-05-08: 4 mg via INTRAVENOUS

## 2021-05-08 MED ORDER — ONDANSETRON HCL 4 MG/2ML IJ SOLN
INTRAMUSCULAR | Status: AC
  Filled 2021-05-08: qty 2

## 2021-05-08 MED ORDER — OXYCODONE HCL 5 MG/5ML PO SOLN: 5.0000 mg | Freq: Once | ORAL | Status: DC | PRN

## 2021-05-08 MED ORDER — MOMETASONE FURO-FORMOTEROL FUM 200-5 MCG/ACT IN AERO
2.0000 | INHALATION_SPRAY | Freq: Two times a day (BID) | RESPIRATORY_TRACT | Status: DC
  Administered 2021-05-08 – 2021-05-09 (×3): 2 via RESPIRATORY_TRACT
  Filled 2021-05-08: qty 8.8

## 2021-05-08 MED ORDER — FENTANYL CITRATE (PF) 100 MCG/2ML IJ SOLN
INTRAMUSCULAR | Status: DC | PRN
  Administered 2021-05-08 (×4): 25 ug via INTRAVENOUS

## 2021-05-08 MED ORDER — ACETAMINOPHEN 650 MG RE SUPP: 325.0000 mg | RECTAL | Status: DC | PRN

## 2021-05-08 MED ORDER — LIDOCAINE HCL (PF) 1 % IJ SOLN
INTRAMUSCULAR | Status: AC
  Filled 2021-05-08: qty 30

## 2021-05-08 MED ORDER — FENTANYL CITRATE (PF) 100 MCG/2ML IJ SOLN
INTRAMUSCULAR | Status: AC
  Filled 2021-05-08: qty 2

## 2021-05-08 MED ORDER — PHENYLEPHRINE HCL-NACL 20-0.9 MG/250ML-% IV SOLN
INTRAVENOUS | Status: AC
  Filled 2021-05-08: qty 250

## 2021-05-08 MED ORDER — PHENYLEPHRINE HCL-NACL 20-0.9 MG/250ML-% IV SOLN
INTRAVENOUS | Status: DC | PRN
Start: 2021-05-08 — End: 2021-05-08
  Administered 2021-05-08: 50 ug/min via INTRAVENOUS
  Administered 2021-05-08: 60 ug/min via INTRAVENOUS

## 2021-05-08 MED ORDER — HEMOSTATIC AGENTS (NO CHARGE) OPTIME
TOPICAL | Status: DC | PRN
  Administered 2021-05-08: 1 via TOPICAL

## 2021-05-08 MED ORDER — MAGNESIUM SULFATE 2 GM/50ML IV SOLN
2.0000 g | Freq: Every day | INTRAVENOUS | Status: DC | PRN
  Filled 2021-05-08: qty 50

## 2021-05-08 MED ORDER — ROCURONIUM BROMIDE 10 MG/ML (PF) SYRINGE
PREFILLED_SYRINGE | INTRAVENOUS | Status: AC
  Filled 2021-05-08: qty 10

## 2021-05-08 MED ORDER — ACETAMINOPHEN 325 MG PO TABS
325.0000 mg | ORAL_TABLET | ORAL | Status: DC | PRN
  Filled 2021-05-08: qty 2

## 2021-05-08 MED ORDER — DEXAMETHASONE SODIUM PHOSPHATE 10 MG/ML IJ SOLN
INTRAMUSCULAR | Status: AC
  Filled 2021-05-08: qty 1

## 2021-05-08 MED ORDER — METOPROLOL TARTRATE 5 MG/5ML IV SOLN: 2.0000 mg | INTRAVENOUS | Status: DC | PRN

## 2021-05-08 MED ORDER — OXYCODONE-ACETAMINOPHEN 5-325 MG PO TABS
1.0000 | ORAL_TABLET | ORAL | Status: DC | PRN
Start: 2021-05-08 — End: 2021-05-09
  Administered 2021-05-08: 1 via ORAL
  Filled 2021-05-08: qty 1

## 2021-05-08 MED ORDER — OMEGA-3-ACID ETHYL ESTERS 1 G PO CAPS
1.0000 g | ORAL_CAPSULE | Freq: Every day | ORAL | Status: DC
  Administered 2021-05-08 – 2021-05-09 (×2): 1 g via ORAL
  Filled 2021-05-08 (×2): qty 1

## 2021-05-08 MED ORDER — LOSARTAN POTASSIUM 50 MG PO TABS
50.0000 mg | ORAL_TABLET | Freq: Every day | ORAL | Status: DC
  Administered 2021-05-08 – 2021-05-09 (×2): 50 mg via ORAL
  Filled 2021-05-08 (×2): qty 1

## 2021-05-08 MED ORDER — HYDRALAZINE HCL 20 MG/ML IJ SOLN: 5.0000 mg | INTRAMUSCULAR | Status: DC | PRN

## 2021-05-08 MED ORDER — CHLORHEXIDINE GLUCONATE CLOTH 2 % EX PADS
6.0000 | MEDICATED_PAD | Freq: Every day | CUTANEOUS | Status: DC
  Administered 2021-05-08: 6 via TOPICAL

## 2021-05-08 MED ORDER — CEFAZOLIN SODIUM-DEXTROSE 2-4 GM/100ML-% IV SOLN
INTRAVENOUS | Status: AC
  Filled 2021-05-08: qty 100

## 2021-05-08 MED ORDER — ROCURONIUM BROMIDE 100 MG/10ML IV SOLN
INTRAVENOUS | Status: DC | PRN
  Administered 2021-05-08: 10 mg via INTRAVENOUS
  Administered 2021-05-08: 50 mg via INTRAVENOUS

## 2021-05-08 MED ORDER — PROPOFOL 500 MG/50ML IV EMUL
INTRAVENOUS | Status: AC
  Filled 2021-05-08: qty 50

## 2021-05-08 MED ORDER — LEVOTHYROXINE SODIUM 137 MCG PO TABS
137.0000 ug | ORAL_TABLET | Freq: Every day | ORAL | Status: DC
  Administered 2021-05-08 – 2021-05-09 (×2): 137 ug via ORAL
  Filled 2021-05-08 (×2): qty 1

## 2021-05-08 MED ORDER — SUGAMMADEX SODIUM 200 MG/2ML IV SOLN
INTRAVENOUS | Status: DC | PRN
  Administered 2021-05-08: 150 mg via INTRAVENOUS

## 2021-05-08 MED ORDER — ESMOLOL HCL-SODIUM CHLORIDE 2000 MG/100ML IV SOLN
25.0000 ug/kg/min | INTRAVENOUS | Status: DC
  Filled 2021-05-08: qty 100

## 2021-05-08 MED ORDER — ALUM & MAG HYDROXIDE-SIMETH 200-200-20 MG/5ML PO SUSP: 15.0000 mL | ORAL | Status: DC | PRN

## 2021-05-08 MED ORDER — PHENYLEPHRINE HCL (PRESSORS) 10 MG/ML IV SOLN
INTRAVENOUS | Status: AC
  Filled 2021-05-08: qty 1

## 2021-05-08 MED ORDER — LABETALOL HCL 5 MG/ML IV SOLN
10.0000 mg | INTRAVENOUS | Status: DC | PRN
  Filled 2021-05-08: qty 4

## 2021-05-08 MED ORDER — PHENYLEPHRINE HCL (PRESSORS) 10 MG/ML IV SOLN: INTRAVENOUS | Status: DC | PRN

## 2021-05-08 MED ORDER — SODIUM CHLORIDE 0.9 % IV SOLN: 500.0000 mL | Freq: Once | INTRAVENOUS | Status: DC | PRN

## 2021-05-08 MED ORDER — INSULIN ASPART 100 UNIT/ML IJ SOLN
0.0000 [IU] | Freq: Three times a day (TID) | INTRAMUSCULAR | Status: DC
  Administered 2021-05-08: 5 [IU] via SUBCUTANEOUS
  Administered 2021-05-09: 2 [IU] via SUBCUTANEOUS
  Filled 2021-05-08 (×2): qty 1

## 2021-05-08 MED ORDER — NITROGLYCERIN IN D5W 200-5 MCG/ML-% IV SOLN: 5.0000 ug/min | INTRAVENOUS | Status: DC

## 2021-05-08 SURGICAL SUPPLY — 62 items
BAG DECANTER FOR FLEXI CONT (MISCELLANEOUS) ×2 IMPLANT
BLADE SURG 15 STRL LF DISP TIS (BLADE) ×1 IMPLANT
BLADE SURG 15 STRL SS (BLADE) ×2
BLADE SURG SZ11 CARB STEEL (BLADE) ×2 IMPLANT
BOOT SUTURE AID YELLOW STND (SUTURE) ×2 IMPLANT
BRUSH SCRUB EZ  4% CHG (MISCELLANEOUS) ×2
BRUSH SCRUB EZ 4% CHG (MISCELLANEOUS) ×1 IMPLANT
CHLORAPREP W/TINT 26 (MISCELLANEOUS) ×2 IMPLANT
DERMABOND ADVANCED (GAUZE/BANDAGES/DRESSINGS) ×1
DERMABOND ADVANCED .7 DNX12 (GAUZE/BANDAGES/DRESSINGS) ×1 IMPLANT
DRAPE 3/4 80X56 (DRAPES) ×2 IMPLANT
DRAPE INCISE IOBAN 66X45 STRL (DRAPES) ×2 IMPLANT
DRAPE LAPAROTOMY 77X122 PED (DRAPES) ×2 IMPLANT
ELECT CAUTERY BLADE 6.4 (BLADE) ×2 IMPLANT
ELECT REM PT RETURN 9FT ADLT (ELECTROSURGICAL) ×2
ELECTRODE REM PT RTRN 9FT ADLT (ELECTROSURGICAL) ×1 IMPLANT
GAUZE 4X4 16PLY ~~LOC~~+RFID DBL (SPONGE) ×2 IMPLANT
GLOVE SURG SYN 7.0 (GLOVE) ×4 IMPLANT
GLOVE SURG SYN 7.0 PF PI (GLOVE) ×2 IMPLANT
GOWN STRL REUS W/ TWL LRG LVL3 (GOWN DISPOSABLE) ×2 IMPLANT
GOWN STRL REUS W/ TWL XL LVL3 (GOWN DISPOSABLE) ×2 IMPLANT
GOWN STRL REUS W/TWL LRG LVL3 (GOWN DISPOSABLE) ×4
GOWN STRL REUS W/TWL XL LVL3 (GOWN DISPOSABLE) ×4
HEMOSTAT SURGICEL 2X3 (HEMOSTASIS) ×2 IMPLANT
IV NS 250ML (IV SOLUTION) ×2
IV NS 250ML BAXH (IV SOLUTION) ×1 IMPLANT
KIT TURNOVER KIT A (KITS) ×2 IMPLANT
LABEL OR SOLS (LABEL) ×2 IMPLANT
LOOP RED MAXI  1X406MM (MISCELLANEOUS) ×2
LOOP VESSEL MAXI  1X406 RED (MISCELLANEOUS) ×2
LOOP VESSEL MAXI 1X406 RED (MISCELLANEOUS) ×2 IMPLANT
LOOP VESSEL MINI 0.8X406 BLUE (MISCELLANEOUS) ×1 IMPLANT
LOOPS BLUE MINI 0.8X406MM (MISCELLANEOUS) ×1
MANIFOLD NEPTUNE II (INSTRUMENTS) ×2 IMPLANT
NDL FILTER BLUNT 18X1 1/2 (NEEDLE) ×1 IMPLANT
NDL HYPO 25X1 1.5 SAFETY (NEEDLE) ×1 IMPLANT
NEEDLE FILTER BLUNT 18X 1/2SAF (NEEDLE) ×1
NEEDLE FILTER BLUNT 18X1 1/2 (NEEDLE) ×1 IMPLANT
NEEDLE HYPO 25X1 1.5 SAFETY (NEEDLE) ×2 IMPLANT
NS IRRIG 500ML POUR BTL (IV SOLUTION) ×2 IMPLANT
PACK BASIN MAJOR ARMC (MISCELLANEOUS) ×2 IMPLANT
PATCH CAROTID ECM VASC 1X10 (Prosthesis & Implant Heart) ×2 IMPLANT
SET WALTER ACTIVATION W/DRAPE (SET/KITS/TRAYS/PACK) ×2 IMPLANT
SHUNT W TPORT 9FR PRUITT F3 (SHUNT) ×2 IMPLANT
SPONGE T-LAP 18X18 ~~LOC~~+RFID (SPONGE) ×4 IMPLANT
SUT MNCRL 4-0 (SUTURE) ×2
SUT MNCRL 4-0 27XMFL (SUTURE) ×1
SUT PROLENE 6 0 BV (SUTURE) ×10 IMPLANT
SUT PROLENE 7 0 BV 1 (SUTURE) ×4 IMPLANT
SUT SILK 2 0 (SUTURE) ×2
SUT SILK 2-0 18XBRD TIE 12 (SUTURE) ×1 IMPLANT
SUT SILK 3 0 (SUTURE) ×2
SUT SILK 3-0 18XBRD TIE 12 (SUTURE) ×1 IMPLANT
SUT SILK 4 0 (SUTURE) ×2
SUT SILK 4-0 18XBRD TIE 12 (SUTURE) ×1 IMPLANT
SUT VIC AB 3-0 SH 27 (SUTURE) ×6
SUT VIC AB 3-0 SH 27X BRD (SUTURE) ×2 IMPLANT
SUTURE MNCRL 4-0 27XMF (SUTURE) ×1 IMPLANT
SYR 10ML LL (SYRINGE) ×4 IMPLANT
SYR 20ML LL LF (SYRINGE) ×2 IMPLANT
TRAY FOLEY MTR SLVR 16FR STAT (SET/KITS/TRAYS/PACK) ×2 IMPLANT
WATER STERILE IRR 500ML POUR (IV SOLUTION) ×2 IMPLANT

## 2021-05-08 NOTE — Anesthesia Preprocedure Evaluation (Addendum)
Anesthesia Evaluation  Patient identified by MRN, date of birth, ID band Patient awake    Reviewed: Allergy & Precautions, NPO status , Patient's Chart, lab work & pertinent test results  History of Anesthesia Complications Negative for: history of anesthetic complications  Airway Mallampati: III  TM Distance: <3 FB Neck ROM: full    Dental  (+) Chipped, Poor Dentition, Missing, Edentulous Upper   Pulmonary neg shortness of breath, asthma , COPD, Current Smoker and Patient abstained from smoking.,    Pulmonary exam normal        Cardiovascular Exercise Tolerance: Good hypertension, (-) angina+ CAD, + Past MI, + CABG, + Peripheral Vascular Disease and +CHF  Normal cardiovascular exam+ dysrhythmias + pacemaker + Cardiac Defibrillator      Neuro/Psych PSYCHIATRIC DISORDERS  Neuromuscular disease negative psych ROS   GI/Hepatic Neg liver ROS, GERD  Controlled,  Endo/Other  diabetes, Type 2, Insulin DependentHypothyroidism   Renal/GU Renal disease     Musculoskeletal  (+) Arthritis ,   Abdominal   Peds  Hematology negative hematology ROS (+)   Anesthesia Other Findings Patient has cardiac clearance for this procedure.   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) No date: Cancer [facility])     Comment:  cervical No date: Carotid artery occlusion No date: CHF (congestive heart failure), NYHA class III (HCC) No date: CKD (chronic kidney disease), stage III (HCC) No date: COPD (chronic obstructive pulmonary disease) (HCC) No date: Coronary artery disease No date: Current use of long term anticoagulation No date: Depression No date: Diabetes mellitus without complication (HCC) No date: GERD (gastroesophageal reflux disease) No date: Gout No date: HLD  (hyperlipidemia) No date: Hx of CABG No date: Hypertension No date: Hypothyroidism No date: Kidney stones No date: LBBB (left bundle branch block) No date: Myocardial infarction ([place]) No date: Presence of permanent cardiac pacemaker No date: Subclavian steal syndrome  Past Surgical History: No date: ABDOMINAL HYSTERECTOMY     Comment:  partial No date: CHOLECYSTECTOMY 01/11/2020: COLONOSCOPY WITH PROPOFOL; N/A     Comment:  Procedure: COLONOSCOPY WITH PROPOFOL;  Surgeon: [place],               Benay Pike, MD;  Location: [facility] ENDOSCOPY;  Service:               Gastroenterology;  Laterality: N/A; No date: CORONARY ANGIOPLASTY No date: CORONARY ARTERY BYPASS GRAFT     Comment:  double 08/2009: EYE SURGERY; Bilateral     Comment:  cataracts 1980s: HIATAL HERNIA REPAIR 07/03/2017: LEFT HEART CATH AND CORONARY ANGIOGRAPHY; N/A     Comment:  Procedure: LEFT HEART CATH AND CORONARY ANGIOGRAPHY;                Surgeon: Corey Skains, MD;  Location: [place]               CV LAB;  Service: Cardiovascular;  Laterality: N/A; 01/11/2018: LOWER EXTREMITY ANGIOGRAPHY; Left     Comment:  Procedure: LOWER EXTREMITY ANGIOGRAPHY;  Surgeon: Algernon Huxley, MD;  Location: [place] CV [facility];  Service:               Cardiovascular;  Laterality: Left; No date: THYROID SURGERY     Comment:  pt does not remember if removed or partial removal  Reproductive/Obstetrics negative OB ROS                             Anesthesia Physical Anesthesia Plan  ASA: 4  Anesthesia Plan: General ETT   Post-op Pain Management:    Induction: Intravenous  PONV Risk Score and Plan: Ondansetron, Dexamethasone, Midazolam and Treatment may vary due to age or medical condition  Airway Management Planned: Oral ETT  Additional Equipment: Arterial line  Intra-op Plan:   Post-operative Plan: Extubation in OR  Informed Consent: I have reviewed the patients History  and Physical, chart, labs and discussed the procedure including the risks, benefits and alternatives for the proposed anesthesia with the patient or authorized representative who has indicated his/her understanding and acceptance.     Dental Advisory Given  Plan Discussed with: Anesthesiologist, CRNA and Surgeon  Anesthesia Plan Comments: (Patient informed that they are higher risk for complications from anesthesia during this procedure due to their medical history.  Patient voiced understanding.  Patient consented for risks of anesthesia including but not limited to:  - adverse reactions to medications - damage to eyes, teeth, lips or other oral mucosa - nerve damage due to positioning  - sore throat or hoarseness - Damage to heart, brain, nerves, lungs, other parts of body or loss of life  Patient voiced understanding.)        Anesthesia Quick Evaluation

## 2021-05-08 NOTE — Anesthesia Postprocedure Evaluation (Signed)
Anesthesia Post Note  Patient: Christine Obrien  Procedure(s) Performed: ENDARTERECTOMY CAROTID (Right)  Patient location during evaluation: PACU Anesthesia Type: General Level of consciousness: awake and alert Pain management: pain level controlled Vital Signs Assessment: post-procedure vital signs reviewed and stable Respiratory status: spontaneous breathing, nonlabored ventilation, respiratory function stable and patient connected to nasal cannula oxygen Cardiovascular status: blood pressure returned to baseline and stable Postop Assessment: no apparent nausea or vomiting Anesthetic complications: no   No notable events documented.   Last Vitals:  Vitals:   05/08/21 1140 05/08/21 1200  BP: 139/77 (!) 118/93  Pulse: 77 84  Resp: 19 15  Temp: 36.9 C   SpO2: 96% 94%    Last Pain:  Vitals:   05/08/21 1140  TempSrc: Oral  PainSc:                  Precious Haws Caleigha Zale

## 2021-05-08 NOTE — Transfer of Care (Signed)
Immediate Anesthesia Transfer of Care Note  Patient: Christine Obrien  Procedure(s) Performed: ENDARTERECTOMY CAROTID (Right)  Patient Location: PACU  Anesthesia Type:General  Level of Consciousness: awake and sedated  Airway & Oxygen Therapy: Patient Spontanous Breathing and Patient connected to nasal cannula oxygen  Post-op Assessment: Report given to RN, Post -op Vital signs reviewed and stable, Patient moving all extremities and Patient moving all extremities X 4  Post vital signs: Reviewed and stable  Last Vitals:  Vitals Value Taken Time  BP    Temp    Pulse    Resp    SpO2      Last Pain:  Vitals:   05/08/21 0745  TempSrc: Temporal         Complications: No notable events documented.

## 2021-05-08 NOTE — Interval H&P Note (Signed)
History and Physical Interval Note:  5/85/9292 4:46 AM  Christine Obrien  has presented today for surgery, with the diagnosis of CAROTID ARTERY STENOSIS.  The various methods of treatment have been discussed with the patient and family. After consideration of risks, benefits and other options for treatment, the patient has consented to  Procedure(s): ENDARTERECTOMY CAROTID (Right) as a surgical intervention.  The patient's history has been reviewed, patient examined, no change in status, stable for surgery.  I have reviewed the patient's chart and labs.  Questions were answered to the patient's satisfaction.     Leotis Pain

## 2021-05-08 NOTE — Anesthesia Procedure Notes (Signed)
Procedure Name: Intubation Date/Time: 05/08/2021 8:57 AM Performed by: Nelda Marseille, CRNA Pre-anesthesia Checklist: Patient identified, Patient being monitored, Timeout performed, Emergency Drugs available and Suction available Patient Re-evaluated:Patient Re-evaluated prior to induction Oxygen Delivery Method: Circle system utilized Preoxygenation: Pre-oxygenation with 100% oxygen Induction Type: IV induction Ventilation: Mask ventilation without difficulty Laryngoscope Size: Mac, 3 and McGraph Grade View: Grade I Tube type: Oral Tube size: 7.0 mm Number of attempts: 1 Airway Equipment and Method: Stylet Placement Confirmation: ETT inserted through vocal cords under direct vision, positive ETCO2 and breath sounds checked- equal and bilateral Secured at: 21 cm Tube secured with: Tape Dental Injury: Teeth and Oropharynx as per pre-operative assessment

## 2021-05-08 NOTE — Op Note (Signed)
[place] [facility]   OPERATIVE NOTE  PROCEDURE:   1.  Right carotid endarterectomy with CorMatrix arterial patch reconstruction  PRE-OPERATIVE DIAGNOSIS: 1.  High grade right carotid stenosis  POST-OPERATIVE DIAGNOSIS: same as above   SURGEON: Leotis Pain, MD  ASSISTANT(S): none  ANESTHESIA: general  ESTIMATED BLOOD LOSS: 50 cc  FINDING(S): 1.  right carotid plaque.  SPECIMEN(S):  Carotid plaque (sent to Pathology)  INDICATIONS:   Christine Obrien is a 81 y.o. female who presents with right carotid stenosis of >75%.  I discussed with the patient the risks, benefits, and alternatives to carotid endarterectomy.  I discussed the differences between carotid stenting and carotid endarterectomy. I discussed the procedural details of carotid endarterectomy with the patient.  The patient is aware that the risks of carotid endarterectomy include but are not limited to: bleeding, infection, stroke, myocardial infarction, death, cranial nerve injuries both temporary and permanent, neck hematoma, possible airway compromise, labile blood pressure post-operatively, cerebral hyperperfusion syndrome, and possible need for additional interventions in the future. The patient is aware of the risks and agrees to proceed forward with the procedure.  DESCRIPTION: After full informed written consent was obtained from the patient, the patient was brought back to the operating room and placed supine upon the operating table.  Prior to induction, the patient received IV antibiotics.  After obtaining adequate anesthesia, the patient was placed into a modified beach chair position with a shoulder roll in place and the patient's neck slightly hyperextended and rotated away from the surgical site.  The patient was prepped in the standard fashion for a carotid endarterectomy.  I made an incision anterior to the sternocleidomastoid muscle and dissected down through the subcutaneous tissue.  The platysmas was  opened with electrocautery.  Then I dissected down to the internal jugular vein and facial vein.  The facial vein is ligated and divided between 2-0 silk ties.  This was dissected posteriorly until I obtained visualization of the common carotid artery.  This was dissected out and then a vessel loop was placed around the common carotid artery.  I then dissected in a periadventitial fashion along the common carotid artery up to the bifurcation.  I then identified the external carotid artery and the superior thyroid artery.  I placed a vessel loop around the superior thyroid artery, and I also dissected out the external carotid artery and placed a vessel loop around it. In the process of this dissection, the hypoglossal nerve was identified and protected from harm.  I then dissected out the internal carotid artery until I identified an area in the internal carotid artery clearly above the stenosis.  I dissected slightly distal to this area, and placed a vessel loop around the artery.  At this point, we gave the patient 5000 units of intravenous heparin.  After this was allowed to circulate for several minutes, I pulled up control on the vessel loops to clamp the internal carotid artery, external carotid artery, superior thyroid artery, and then the common carotid artery.  I then made an arteriotomy in the common carotid artery with a 11 blade, and extended the arteriotomy with a Potts scissor down into the common carotid artery, then I carried the arteriotomy through the bifurcation into the internal carotid artery until I reached an area that was not diseased.  At this point, I took the [place] shunt that previously been prepared and I inserted it into the internal carotid artery first, and then into the common carotid  artery taking care to flush and de-air prior to release of control. At this point, I started the endarterectomy in the common carotid artery with a [place] elevator and carried this dissection  down into the common carotid artery circumferentially.  Then I transected the plaque at a segment where it was adherent and transected the plaque with Potts scissors.  I then carried this dissection up into the external carotid artery.  The plaque was extracted by unclamping the external carotid artery and performing an eversion endarterectomy.  The dissection was then carried into the internal carotid artery where a nice feathered end point was created with gentle traction.  I passed the plaque off the field as a specimen. At this point I removed all loose flecks and remaining disease possible.  At this point, I was satisfied that the minimal remaining disease was densely adherent to the wall and wall integrity was intact. The distal endpoint was clean.  I then fashioned a CorMatrix arterial patch for the artery and sewed it in place with two running stitch of 6-0 Prolene.  I started at the distal endpoint and ran one half the length of the arteriotomy.  I then cut and beveled the patch to an appropriate length to match the arteriotomy.  I started the second 6-0 Prolene at the proximal end point.  The medial suture line was completed and the lateral suture line was run approximately one quarter the length of the arteriotomy.  Prior to completing this patch angioplasty, I removed the shunt first from the internal carotid artery, from which there was excellent backbleeding, and clamped it.  Then I removed the shunt from the common carotid artery, from which there was excellent antegrade bleeding, and then clamped it.  At this point, I allowed the external carotid artery to backbleed, which was excellent.  Then I instilled heparinized saline in this patched artery and then completed the patch angioplasty in the usual fashion.  First, I released the clamp on the external carotid artery, then I released it on the common carotid artery.  After waiting a few seconds, I then released it on the internal carotid artery.  Several minutes of pressure were held and 6-0 Prolene patch sutures were used as need for hemostasis.  At this point, I placed Surgicel and Evicel topical hemostatic agents.  There was no more active bleeding in the surgical site.  The sternocleidomastoid space was closed with three interrupted 3-0 Vicryl sutures. I then reapproximated the platysma muscle with a running stitch of 3-0 Vicryl.  The skin was then closed with a running subcuticular 4-0 Monocryl.  The skin was then cleaned, dried and Dermabond was used to reinforce the skin closure.  The patient awakened and was taken to the recovery room in stable condition, following commands and moving all four extremities without any apparent deficits.    COMPLICATIONS: none  CONDITION: stable  Leotis Pain  05/08/2021, 11:07 AM    This note was created with Dragon Medical transcription system. Any errors in dictation are purely unintentional.

## 2021-05-08 NOTE — Progress Notes (Signed)
°   05/08/21 0800  Clinical Encounter Type  Visited With Patient  Visit Type Initial;Pre-op;Spiritual support   Chaplain provided pre op support through assurance and prayer.

## 2021-05-08 NOTE — Progress Notes (Signed)
°   05/08/21 1400  Clinical Encounter Type  Visited With Patient and family together  Visit Type Follow-up;Post-op   Chaplain notice that patient a who he visited in Pre op had moved to ICU. Chaplain was concerned and went and visited. It was a relief to find out that in this case, it was standard procedure. Chaplain engaged with two visitors as well.

## 2021-05-09 ENCOUNTER — Encounter: Payer: Self-pay | Admitting: Vascular Surgery

## 2021-05-09 LAB — CBC
HCT: 37.5 % (ref 36.0–46.0)
Hemoglobin: 12 g/dL (ref 12.0–15.0)
MCH: 31 pg (ref 26.0–34.0)
MCHC: 32 g/dL (ref 30.0–36.0)
MCV: 96.9 fL (ref 80.0–100.0)
Platelets: 144 10*3/uL — ABNORMAL LOW (ref 150–400)
RBC: 3.87 MIL/uL (ref 3.87–5.11)
RDW: 13.3 % (ref 11.5–15.5)
WBC: 11.5 10*3/uL — ABNORMAL HIGH (ref 4.0–10.5)
nRBC: 0 % (ref 0.0–0.2)

## 2021-05-09 LAB — BASIC METABOLIC PANEL
Anion gap: 7 (ref 5–15)
BUN: 18 mg/dL (ref 8–23)
CO2: 28 mmol/L (ref 22–32)
Calcium: 9.6 mg/dL (ref 8.9–10.3)
Chloride: 106 mmol/L (ref 98–111)
Creatinine, Ser: 1.24 mg/dL — ABNORMAL HIGH (ref 0.44–1.00)
GFR, Estimated: 44 mL/min — ABNORMAL LOW (ref >60)
Glucose, Bld: 148 mg/dL — ABNORMAL HIGH (ref 70–99)
Potassium: 4.6 mmol/L (ref 3.5–5.1)
Sodium: 141 mmol/L (ref 135–145)

## 2021-05-09 LAB — GLUCOSE, CAPILLARY
Glucose-Capillary: 129 mg/dL — ABNORMAL HIGH (ref 70–99)
Glucose-Capillary: 107 mg/dL — ABNORMAL HIGH (ref 70–99)

## 2021-05-09 LAB — SURGICAL PATHOLOGY

## 2021-05-09 MED ORDER — FAMOTIDINE 20 MG PO TABS
20.0000 mg | ORAL_TABLET | Freq: Two times a day (BID) | ORAL | Status: DC
  Administered 2021-05-09: 20 mg via ORAL
  Filled 2021-05-09: qty 1

## 2021-05-09 MED ORDER — ASPIRIN EC 81 MG PO TBEC: 81.0000 mg | DELAYED_RELEASE_TABLET | Freq: Every day | ORAL | 11 refills | Status: DC

## 2021-05-09 MED ORDER — HYDROCODONE-ACETAMINOPHEN 5-325 MG PO TABS: 1.0000 | ORAL_TABLET | Freq: Four times a day (QID) | ORAL | 0 refills | Status: DC | PRN

## 2021-05-09 MED ORDER — NOREPINEPHRINE 4 MG/250ML-% IV SOLN
INTRAVENOUS | Status: AC
  Filled 2021-05-09: qty 250

## 2021-05-09 NOTE — Consult Note (Signed)
[facility] Consultation Note  Patient ID: Christine Obrien, MRN: [id number], DOB/AGE: [birth year] 80 y.o. Admit date: 05/08/2021   Date of Consult: 05/09/2021 Primary Physician: [name], MD Primary Cardiologist: Nehemiah Massed  Chief Complaint: Abnormal EKG  Reason for Consult:  Abnormal EKG  HPI: 81 y.o. female with known abnormal EKG previous coronary artery bypass grafting diabetes hypertension hyperlipidemia chronic kidney disease status post of pacemaker defibrillator placement in the past.  The patient has peripheral vascular disease for which she has had a recent carotid endarterectomy which went well.  She has been on appropriate medication management and has been stable from the cardiovascular standpoint with no current evidence of congestive heart failure and or anginal equivalent.  The patient has been replaced on her previous appropriate medication management after surgery which went well.  EKG has been abnormal prior to the surgery including normal sinus rhythm and left bundle branch block.  Additionally she has EKG changes of normal sinus rhythm and paced rhythm.  These are unchanged from before and has no symptoms from this at this time.   Past Medical History:  Diagnosis Date   AICD (automatic cardioverter/defibrillator) present    Anxiety    Asthma    Atherosclerotic peripheral vascular disease with intermittent claudication (HCC)    Cancer (HCC)    cervical   Carotid artery occlusion    CHF (congestive heart failure), NYHA class III (HCC)    CKD (chronic kidney disease), stage III (HCC)    COPD (chronic obstructive pulmonary disease) (HCC)    Coronary artery disease    Current use of long term anticoagulation    Depression    Diabetes mellitus without complication (HCC)    GERD (gastroesophageal reflux disease)    Gout    HLD (hyperlipidemia)    Hx of CABG    Hypertension    Hypothyroidism    Kidney stones    LBBB (left bundle branch block)     Myocardial infarction ([place])    Presence of permanent cardiac pacemaker    Subclavian steal syndrome       Surgical History:  Past Surgical History:  Procedure Laterality Date   ABDOMINAL HYSTERECTOMY     partial   CHOLECYSTECTOMY     COLONOSCOPY WITH PROPOFOL N/A 01/11/2020   Procedure: COLONOSCOPY WITH PROPOFOL;  Surgeon: Toledo, Benay Pike, MD;  Location: [facility] ENDOSCOPY;  Service: Gastroenterology;  Laterality: N/A;   CORONARY ANGIOPLASTY     CORONARY ARTERY BYPASS GRAFT     double   ENDARTERECTOMY Right 05/08/2021   Procedure: ENDARTERECTOMY CAROTID;  Surgeon: Algernon Huxley, MD;  Location: [facility] ORS;  Service: Vascular;  Laterality: Right;   EYE SURGERY Bilateral 08/2009   cataracts   HIATAL HERNIA REPAIR  1980s   LEFT HEART CATH AND CORONARY ANGIOGRAPHY N/A 07/03/2017   Procedure: LEFT HEART CATH AND CORONARY ANGIOGRAPHY;  Surgeon: Corey Skains, MD;  Location: [facility];  Service: Cardiovascular;  Laterality: N/A;   LOWER EXTREMITY ANGIOGRAPHY Left 01/11/2018   Procedure: LOWER EXTREMITY ANGIOGRAPHY;  Surgeon: Algernon Huxley, MD;  Location: [facility];  Service: Cardiovascular;  Laterality: Left;   THYROID SURGERY     pt does not remember if removed or partial removal     Home Meds: Prior to Admission medications   Medication Sig Start Date End Date Taking? Authorizing Provider  Acetaminophen (TYLENOL PO) Take 500 mg by mouth.    Yes [provider]  albuterol (VENTOLIN HFA) 108 (90  Base) MCG/ACT inhaler SMARTSIG:2 Inhalation Via Inhaler Every 4 Hours PRN 01/03/21  Yes [provider]  aspirin EC 81 MG tablet Take 1 tablet (81 mg total) by mouth daily. Swallow whole. 05/09/21  Yes Kris Hartmann, NP  buPROPion ER [facility] SR) 100 MG 12 hr tablet Take 1 tablet by mouth daily. 01/15/21  Yes [provider]  empagliflozin (JARDIANCE) 10 MG TABS tablet Take 10 mg by mouth daily.   Yes [provider]  ezetimibe (ZETIA) 10 MG  tablet Take by mouth. 08/20/17 05/08/21 Yes [provider]  ferrous sulfate 325 (65 FE) MG tablet Take 325 mg by mouth.  03/09/16  Yes [provider]  fluticasone (FLONASE) 50 MCG/ACT nasal spray Place 2 sprays into the nose daily.   Yes [provider]  fluticasone-salmeterol (ADVAIR) 250-50 MCG/ACT AEPB Inhale 1 puff into the lungs in the morning and at bedtime.   Yes [provider]  Fluticasone-Salmeterol (ADVAIR) 250-50 MCG/DOSE AEPB Inhale 1 puff into the lungs daily. 08/13/18  Yes [provider]  furosemide (LASIX) 20 MG tablet Take 1 tablet (20 mg total) by mouth daily. 07/25/20  Yes Fritzi Mandes, MD  glipiZIDE (GLUCOTROL) 5 MG tablet Take by mouth daily before breakfast.   Yes [provider]  HYDROcodone-acetaminophen (NORCO) 5-325 MG tablet Take 1 tablet by mouth every 6 (six) hours as needed for moderate pain. 05/09/21  Yes Kris Hartmann, NP  levothyroxine (SYNTHROID) 137 MCG tablet Take 1 tablet (137 mcg total) by mouth daily. 07/25/20  Yes Fritzi Mandes, MD  loratadine (CLARITIN) 10 MG tablet Take 10 mg by mouth daily. 08/04/18  Yes [provider]  losartan (COZAAR) 25 MG tablet Take by mouth. 12/02/18 05/08/21 Yes [provider]  metoprolol tartrate (LOPRESSOR) 25 MG tablet Take 1 tablet (25 mg total) by mouth 2 (two) times daily. 07/04/17  Yes Fritzi Mandes, MD  Omega-3 Fatty Acids (FISH OIL) 1000 MG CAPS Take 2 capsules by mouth 2 (two) times daily. 01/15/21  Yes [provider]  omeprazole (PRILOSEC) 20 MG capsule Take 20 mg by mouth 2 (two) times daily before a meal. 01/09/16  Yes [provider]  rosuvastatin (CRESTOR) 5 MG tablet Take 5 mg by mouth daily. 01/11/21  Yes [provider]  venlafaxine (EFFEXOR) 75 MG tablet Take 75 mg by mouth 2 (two) times daily with a meal.   Yes [provider]  apixaban (ELIQUIS) 5 MG TABS tablet Take 5 mg by mouth 2 (two) times daily. 03/05/20    [provider]  metFORMIN (GLUCOPHAGE) 500 MG tablet Take 500 mg by mouth 2 (two) times daily with a meal.    [provider]  Multiple Vitamin (MULTIVITAMIN) capsule Take 1 capsule by mouth daily. Patient not taking: Reported on 05/01/2021    [provider]    Inpatient Medications:   aspirin EC  81 mg Oral Q0600   buPROPion ER  100 mg Oral Daily   Chlorhexidine Gluconate Cloth  6 each Topical Daily   clopidogrel  75 mg Oral Daily   docusate sodium  100 mg Oral Daily   empagliflozin  10 mg Oral Daily   ezetimibe  10 mg Oral Daily   famotidine  20 mg Oral BID   ferrous sulfate  325 mg Oral Q breakfast   fluticasone  2 spray Each Nare Daily   furosemide  20 mg Oral Daily   glipiZIDE  5 mg Oral QAC breakfast   insulin aspart  0-15 Units Subcutaneous TID WC   levothyroxine  137 mcg Oral Daily   loratadine  10 mg Oral Daily   losartan  50 mg Oral Daily   metFORMIN  500 mg Oral BID WC   metoprolol tartrate  25 mg Oral BID   mometasone-formoterol  2 puff Inhalation BID   omega-3 acid ethyl esters  1 g Oral Daily   pantoprazole  40 mg Oral Daily   rosuvastatin  5 mg Oral Daily   venlafaxine  75 mg Oral BID WC    sodium chloride Stopped (05/08/21 1337)   sodium chloride     esmolol     magnesium sulfate bolus IVPB     nitroGLYCERIN     norepinephrine Stopped (05/09/21 0601)    Allergies:  Allergies  Allergen Reactions   Ace Inhibitors     Other reaction(s): Cough   Atorvastatin     Other reaction(s): Muscle Pain   Statins     Other reaction(s): Muscle Pain Muscle pain   Sulfa Antibiotics     Stomach pains    Social History   Socioeconomic History   Marital status: Widowed    Spouse name: Not on file   Number of children: Not on file   Years of education: Not on file   Highest education level: Not on file  Occupational History   Not on file  Tobacco Use   Smoking status: Every Day    Packs/day: 1.00    Types: Cigarettes   Smokeless  tobacco: Never  Vaping Use   Vaping Use: Never used  Substance and Sexual Activity   Alcohol use: No   Drug use: No   Sexual activity: Not on file  Other Topics Concern   Not on file  Social History Narrative   Not on file   Social Determinants of Health   Financial Resource Strain: Not on file  Food Insecurity: Not on file  Transportation Needs: Not on file  Physical Activity: Not on file  Stress: Not on file  Social Connections: Not on file  Intimate Partner Violence: Not on file     Family History  Problem Relation Age of Onset   Hypertension Mother    Hypertension Father    Diabetes Sister    Cancer Brother    Diabetes Brother    Breast cancer Neg Hx      Review of Systems Positive for none Negative for: General:  chills, fever, night sweats or weight changes.  Cardiovascular: PND orthopnea syncope dizziness  Dermatological skin lesions rashes Respiratory: Cough congestion Urologic: Frequent urination urination at night and hematuria Abdominal: negative for nausea, vomiting, diarrhea, bright red blood per rectum, Obrien, or hematemesis Neurologic: negative for visual changes, and/or hearing changes  All other systems reviewed and are otherwise negative except as noted above.  Labs: No results for input(s): CKTOTAL, CKMB, TROPONINI in the last 72 hours. Lab Results  Component Value Date   WBC 11.5 (H) 05/09/2021   HGB 12.0 05/09/2021   HCT 37.5 05/09/2021   MCV 96.9 05/09/2021   PLT 144 (L) 05/09/2021    Recent Labs  Lab 05/09/21 0517  NA 141  K 4.6  CL 106  CO2 28  BUN 18  CREATININE 1.24*  CALCIUM 9.6  GLUCOSE 148*   Lab Results  Component Value Date   CHOL 223 (H) 07/03/2017   HDL 41 07/03/2017   LDLCALC 150 (H) 07/03/2017   TRIG 160 (H) 07/03/2017   No results found for:  DDIMER  Radiology/Studies:  No results found.  EKG: Normal sinus rhythm with left bundle branch block  Weights: Filed Weights   05/08/21 1140  Weight: 69.5 kg      Physical Exam: Blood pressure (!) 173/141, pulse 60, temperature 98.5 F (36.9 C), resp. rate 17, height 5\' 3"  (1.6 m), weight 69.5 kg, SpO2 100 %. Body mass index is 27.14 kg/m. General: Well developed, well nourished, in no acute distress. Head eyes ears nose throat: Normocephalic, atraumatic, sclera non-icteric, no xanthomas, nares are without discharge. No apparent thyromegaly and/or mass  Lungs: Normal respiratory effort.  no wheezes, no rales, no rhonchi.  Heart: RRR with normal S1 S2. no murmur gallop, no rub, PMI is normal size and placement, carotid upstroke normal without bruit, jugular venous pressure is normal Abdomen: Soft, non-tender, non-distended with normoactive bowel sounds. No hepatomegaly. No rebound/guarding. No obvious abdominal masses. Abdominal aorta is normal size without bruit Extremities: No edema. no cyanosis, no clubbing, no ulcers  Peripheral : 2+ bilateral upper extremity pulses, 2+ bilateral femoral pulses, 2+ bilateral dorsal pedal pulse Neuro: Alert and oriented. No facial asymmetry. No focal deficit. Moves all extremities spontaneously. Musculoskeletal: Normal muscle tone without kyphosis Psych:  Responds to questions appropriately with a normal affect.    Assessment: 81 year old female with cardiomyopathy LV systolic dysfunction valvular heart disease coronary artery disease status post coronary bypass graft peripheral vascular disease status post carotid endarterectomy stable at this time on appropriate medication management after endarterectomy with an abnormal EKG unchanged from before consistent with her previous diagnosis  Plan: 1.  No further cardiac diagnostics necessary at this time due to no evidence of acute coronary syndrome or congestive heart failure 2.  Continue medication management for cardiovascular risks as before without change 3.  Begin ambulation and follow-up for improvements of symptoms and if no further symptoms okay for  discharged home from cardiac standpoint with follow-up in 1 week  Signed, Corey Skains M.D. [facility] 05/09/2021, 4:58 PM

## 2021-05-09 NOTE — Discharge Instructions (Signed)
Ok to shower in 24 hours No lifting >10 pounds for 24 hours

## 2021-05-09 NOTE — Discharge Summary (Addendum)
[facility]    Discharge Summary    Patient ID:  Christine Obrien MRN: [id number] DOB/AGE: Mar 29, 1941 81 y.o.  Admit date: 05/08/2021 Discharge date: 05/09/2021 Date of Surgery: 05/08/2021 Surgeon: Surgeon(s): Lucky Cowboy Erskine Squibb, MD  Admission Diagnosis: Carotid stenosis, right [I65.21]  Discharge Diagnoses:  Carotid stenosis, right [I65.21]  Secondary Diagnoses: Past Medical History:  Diagnosis Date   AICD (automatic cardioverter/defibrillator) present    Anxiety    Asthma    Atherosclerotic peripheral vascular disease with intermittent claudication (HCC)    Cancer ([place])    cervical   Carotid artery occlusion    CHF (congestive heart failure), NYHA class III (HCC)    CKD (chronic kidney disease), stage III (HCC)    COPD (chronic obstructive pulmonary disease) ([place])    Coronary artery disease    Current use of long term anticoagulation    Depression    Diabetes mellitus without complication (HCC)    GERD (gastroesophageal reflux disease)    Gout    HLD (hyperlipidemia)    Hx of CABG    Hypertension    Hypothyroidism    Kidney stones    LBBB (left bundle branch block)    Myocardial infarction ([place])    Presence of permanent cardiac pacemaker    Subclavian steal syndrome     Procedure(s): ENDARTERECTOMY CAROTID  Discharged Condition: good  HPI:  Patient doing well after surgery.  Swallowing and eating well.  Some bruising.  ICD interrogated, no issues noted.  Neurologically intact   Hospital Course:  Christine Obrien is a 81 y.o. female is S/P Right Procedure(s): ENDARTERECTOMY CAROTID Extubated: POD # 0 Physical exam: Wound intact, swallowing well, no voice changes, neurologically intact  Post-op wounds clean, dry, intact or healing well Pt. Ambulating, voiding and taking PO diet without difficulty. Pt pain controlled with PO pain meds. Labs as below Complications:none  Consults:    Significant Diagnostic Studies: CBC Lab Results   Component Value Date   WBC 11.5 (H) 05/09/2021   HGB 12.0 05/09/2021   HCT 37.5 05/09/2021   MCV 96.9 05/09/2021   PLT 144 (L) 05/09/2021    BMET    Component Value Date/Time   NA 141 05/09/2021 0517   NA 142 04/01/2013 0456   K 4.6 05/09/2021 0517   K 3.6 04/01/2013 0456   CL 106 05/09/2021 0517   CL 110 (H) 04/01/2013 0456   CO2 28 05/09/2021 0517   CO2 26 04/01/2013 0456   GLUCOSE 148 (H) 05/09/2021 0517   GLUCOSE 93 04/01/2013 0456   BUN 18 05/09/2021 0517   BUN 7 04/01/2013 0456   CREATININE 1.24 (H) 05/09/2021 0517   CREATININE 0.73 04/01/2013 0456   CALCIUM 9.6 05/09/2021 0517   CALCIUM 9.0 04/01/2013 0456   GFRNONAA 44 (L) 05/09/2021 0517   GFRNONAA >60 04/01/2013 0456   GFRAA >60 01/11/2018 1332   GFRAA >60 04/01/2013 0456   COAG Lab Results  Component Value Date   INR 1.1 07/18/2020   INR 1.2 07/11/2020   INR 1.07 07/03/2017     Disposition:  Discharge to :Home  Allergies as of 05/09/2021       Reactions   Ace Inhibitors    Other reaction(s): Cough   Atorvastatin    Other reaction(s): Muscle Pain   Statins    Other reaction(s): Muscle Pain Muscle pain   Sulfa Antibiotics    Stomach pains        Medication List  STOP taking these medications    clopidogrel 75 MG tablet Commonly known as: PLAVIX   oxyCODONE-acetaminophen 5-325 MG tablet Commonly known as: Percocet       TAKE these medications    albuterol 108 (90 Base) MCG/ACT inhaler Commonly known as: VENTOLIN HFA SMARTSIG:2 Inhalation Via Inhaler Every 4 Hours PRN   apixaban 5 MG Tabs tablet Commonly known as: ELIQUIS Take 5 mg by mouth 2 (two) times daily.   aspirin EC 81 MG tablet Take 1 tablet (81 mg total) by mouth daily. Swallow whole.   buPROPion ER 100 MG 12 hr tablet Commonly known as: WELLBUTRIN SR Take 1 tablet by mouth daily.   empagliflozin 10 MG Tabs tablet Commonly known as: JARDIANCE Take 10 mg by mouth daily.   ezetimibe 10 MG  tablet Commonly known as: ZETIA Take by mouth.   ferrous sulfate 325 (65 FE) MG tablet Take 325 mg by mouth.   Fish Oil 1000 MG Caps Take 2 capsules by mouth 2 (two) times daily.   fluticasone 50 MCG/ACT nasal spray Commonly known as: FLONASE Place 2 sprays into the nose daily.   fluticasone-salmeterol 250-50 MCG/ACT Aepb Commonly known as: ADVAIR Inhale 1 puff into the lungs in the morning and at bedtime.   Fluticasone-Salmeterol 250-50 MCG/DOSE Aepb Commonly known as: ADVAIR Inhale 1 puff into the lungs daily.   furosemide 20 MG tablet Commonly known as: LASIX Take 1 tablet (20 mg total) by mouth daily.   glipiZIDE 5 MG tablet Commonly known as: GLUCOTROL Take by mouth daily before breakfast.   HYDROcodone-acetaminophen 5-325 MG tablet Commonly known as: Norco Take 1 tablet by mouth every 6 (six) hours as needed for moderate pain.   levothyroxine 137 MCG tablet Commonly known as: SYNTHROID Take 1 tablet (137 mcg total) by mouth daily.   loratadine 10 MG tablet Commonly known as: CLARITIN Take 10 mg by mouth daily.   losartan 25 MG tablet Commonly known as: COZAAR Take by mouth.   metFORMIN 500 MG tablet Commonly known as: GLUCOPHAGE Take 500 mg by mouth 2 (two) times daily with a meal.   metoprolol tartrate 25 MG tablet Commonly known as: LOPRESSOR Take 1 tablet (25 mg total) by mouth 2 (two) times daily.   multivitamin capsule Take 1 capsule by mouth daily.   omeprazole 20 MG capsule Commonly known as: PRILOSEC Take 20 mg by mouth 2 (two) times daily before a meal.   rosuvastatin 5 MG tablet Commonly known as: CRESTOR Take 5 mg by mouth daily.   TYLENOL PO Take 500 mg by mouth.   venlafaxine 75 MG tablet Commonly known as: EFFEXOR Take 75 mg by mouth 2 (two) times daily with a meal.       Verbal and written Discharge instructions given to the patient. Wound care per Discharge AVS  Follow-up Information     Kris Hartmann, NP Follow up.    Specialty: Vascular Surgery Why: 2-3 weeks with carotid Contact information: [place] [address] [phone]                 Signed: Kris Hartmann, NP  05/09/2021, 9:34 AM

## 2021-05-09 NOTE — Progress Notes (Signed)
PHARMACIST - PHYSICIAN COMMUNICATION  CONCERNING: IV to Oral Route Change Policy  RECOMMENDATION: This patient is receiving famotidine by the intravenous route.  Based on criteria approved by the Pharmacy and Therapeutics Committee, the intravenous medication(s) is/are being converted to the equivalent oral dose form(s).   DESCRIPTION: These criteria include: The patient is eating (either orally or via tube) and/or has been taking other orally administered medications for a least 24 hours The patient has no evidence of active gastrointestinal bleeding or impaired GI absorption (gastrectomy, short bowel, patient on TNA or NPO).  If you have questions about this conversion, please contact the [place], [facility] 05/09/2021 7:48 AM

## 2021-05-23 ENCOUNTER — Telehealth (INDEPENDENT_AMBULATORY_CARE_PROVIDER_SITE_OTHER): Payer: Self-pay

## 2021-05-23 ENCOUNTER — Other Ambulatory Visit (INDEPENDENT_AMBULATORY_CARE_PROVIDER_SITE_OTHER): Payer: Self-pay | Admitting: Nurse Practitioner

## 2021-05-23 MED ORDER — HYDROCODONE-ACETAMINOPHEN 5-325 MG PO TABS: 1.0000 | ORAL_TABLET | Freq: Four times a day (QID) | ORAL | 0 refills | Status: DC | PRN

## 2021-05-23 NOTE — Telephone Encounter (Signed)
The pt's daughter called and left a Vm on the nurses line wanting to have her mother pain medication refilled. The pt's daughter also waned to know what could the pt do about her incision itching where she just recently had a carotid endarterectomy in 05/08/21. Please advise.

## 2021-05-23 NOTE — Telephone Encounter (Signed)
She can try using cool compresses on it as well taking a daily claritin for itching, refill sent

## 2021-05-27 NOTE — Telephone Encounter (Signed)
Called pts daughter and made her aware of the Np's instructions.

## 2021-05-31 ENCOUNTER — Other Ambulatory Visit (INDEPENDENT_AMBULATORY_CARE_PROVIDER_SITE_OTHER): Payer: Self-pay | Admitting: Nurse Practitioner

## 2021-05-31 ENCOUNTER — Ambulatory Visit (INDEPENDENT_AMBULATORY_CARE_PROVIDER_SITE_OTHER): Payer: Medicare Other

## 2021-05-31 ENCOUNTER — Ambulatory Visit (INDEPENDENT_AMBULATORY_CARE_PROVIDER_SITE_OTHER): Payer: Medicare Other | Admitting: Nurse Practitioner

## 2021-05-31 ENCOUNTER — Other Ambulatory Visit: Payer: Self-pay

## 2021-05-31 VITALS — BP 90/67 | HR 62 | Ht 63.0 in | Wt 136.0 lb

## 2021-05-31 DIAGNOSIS — I6521 Occlusion and stenosis of right carotid artery: Secondary | ICD-10-CM

## 2021-05-31 DIAGNOSIS — Z9889 Other specified postprocedural states: Secondary | ICD-10-CM | POA: Diagnosis not present

## 2021-05-31 DIAGNOSIS — E1151 Type 2 diabetes mellitus with diabetic peripheral angiopathy without gangrene: Secondary | ICD-10-CM

## 2021-05-31 DIAGNOSIS — I10 Essential (primary) hypertension: Secondary | ICD-10-CM

## 2021-06-09 ENCOUNTER — Encounter (INDEPENDENT_AMBULATORY_CARE_PROVIDER_SITE_OTHER): Payer: Self-pay | Admitting: Nurse Practitioner

## 2021-06-09 NOTE — Progress Notes (Signed)
Subjective:    Patient ID: Christine Obrien, female    DOB: 1940-05-21, 81 y.o.   MRN: [id number] Chief Complaint  Patient presents with   Follow-up    [facility]    The patient is seen for follow up evaluation of carotid stenosis status post right carotid endarterectomy on 05/08/2021.  There were no post operative problems or complications related to the surgery.  The patient denies neck or incisional pain.  The patient denies interval amaurosis fugax. There is no recent history of TIA symptoms or focal motor deficits. There is no prior documented CVA.  The patient denies headache.  The patient is taking enteric-coated aspirin 81 mg daily.  The patient has a history of coronary artery disease, no recent episodes of angina or shortness of breath. The patient denies PAD or claudication symptoms. There is a history of hyperlipidemia which is being treated with a statin.    No evidence of stenosis in the right ICA.  Velocities in the left ICA are consistent with a 1 to 39% stenosis.  The right vertebral artery has antegrade flow whereas the left has retrograde flow.  Normal flow hemodynamics seen in the bilateral subclavian arteries.   Review of Systems  Skin:  Positive for wound.  All other systems reviewed and are negative.     Objective:   Physical Exam Vitals reviewed.  HENT:     Head: Normocephalic.  Cardiovascular:     Rate and Rhythm: Normal rate.     Pulses: Normal pulses.  Pulmonary:     Effort: Pulmonary effort is normal.  Skin:    General: Skin is warm and dry.  Neurological:     Mental Status: She is alert and oriented to person, place, and time.  Psychiatric:        Mood and Affect: Mood normal.        Behavior: Behavior normal.        Thought Content: Thought content normal.        Judgment: Judgment normal.    BP 90/67    Pulse 62    Ht 5\' 3"  (1.6 m)    Wt 136 lb (61.7 kg)    BMI 24.09 kg/m   Past Medical History:  Diagnosis Date   AICD (automatic  cardioverter/defibrillator) present    Anxiety    Asthma    Atherosclerotic peripheral vascular disease with intermittent claudication (HCC)    Cancer (HCC)    cervical   Carotid artery occlusion    CHF (congestive heart failure), NYHA class III (HCC)    CKD (chronic kidney disease), stage III (HCC)    COPD (chronic obstructive pulmonary disease) (HCC)    Coronary artery disease    Current use of long term anticoagulation    Depression    Diabetes mellitus without complication (HCC)    GERD (gastroesophageal reflux disease)    Gout    HLD (hyperlipidemia)    Hx of CABG    Hypertension    Hypothyroidism    Kidney stones    LBBB (left bundle branch block)    Myocardial infarction ([place])    Presence of permanent cardiac pacemaker    Subclavian steal syndrome     Social History   Socioeconomic History   Marital status: Widowed    Spouse name: Not on file   Number of children: Not on file   Years of education: Not on file   Highest education level: Not on file  Occupational History  Not on file  Tobacco Use   Smoking status: Every Day    Packs/day: 1.00    Types: Cigarettes   Smokeless tobacco: Never  Vaping Use   Vaping Use: Never used  Substance and Sexual Activity   Alcohol use: No   Drug use: No   Sexual activity: Not on file  Other Topics Concern   Not on file  Social History Narrative   Not on file   Social Determinants of Health   Financial Resource Strain: Not on file  Food Insecurity: Not on file  Transportation Needs: Not on file  Physical Activity: Not on file  Stress: Not on file  Social Connections: Not on file  Intimate Partner Violence: Not on file    Past Surgical History:  Procedure Laterality Date   ABDOMINAL HYSTERECTOMY     partial   CHOLECYSTECTOMY     COLONOSCOPY WITH PROPOFOL N/A 01/11/2020   Procedure: COLONOSCOPY WITH PROPOFOL;  Surgeon: Toledo, Benay Pike, MD;  Location: [facility];  Service: Gastroenterology;  Laterality:  N/A;   CORONARY ANGIOPLASTY     CORONARY ARTERY BYPASS GRAFT     double   ENDARTERECTOMY Right 05/08/2021   Procedure: ENDARTERECTOMY CAROTID;  Surgeon: Algernon Huxley, MD;  Location: [facility];  Service: Vascular;  Laterality: Right;   EYE SURGERY Bilateral 08/2009   cataracts   HIATAL HERNIA REPAIR  1980s   LEFT HEART CATH AND CORONARY ANGIOGRAPHY N/A 07/03/2017   Procedure: LEFT HEART CATH AND CORONARY ANGIOGRAPHY;  Surgeon: Corey Skains, MD;  Location: [place] [facility];  Service: Cardiovascular;  Laterality: N/A;   LOWER EXTREMITY ANGIOGRAPHY Left 01/11/2018   Procedure: LOWER EXTREMITY ANGIOGRAPHY;  Surgeon: Algernon Huxley, MD;  Location: [place] [facility];  Service: Cardiovascular;  Laterality: Left;   THYROID SURGERY     pt does not remember if removed or partial removal    Family History  Problem Relation Age of Onset   Hypertension Mother    Hypertension Father    Diabetes Sister    Cancer Brother    Diabetes Brother    Breast cancer Neg Hx     Allergies  Allergen Reactions   Ace Inhibitors     Other reaction(s): Cough   Atorvastatin     Other reaction(s): Muscle Pain   Statins     Other reaction(s): Muscle Pain Muscle pain   Sulfa Antibiotics     Stomach pains    CBC Latest Ref Rng & Units 05/09/2021 05/02/2021 12/16/2020  WBC 4.0 - 10.5 K/uL 11.5(H) 6.8 6.9  Hemoglobin 12.0 - 15.0 g/dL 12.0 14.2 13.7  Hematocrit 36.0 - 46.0 % 37.5 42.9 39.9  Platelets 150 - 400 K/uL 144(L) 213 169      CMP     Component Value Date/Time   NA 141 05/09/2021 0517   NA 142 04/01/2013 0456   K 4.6 05/09/2021 0517   K 3.6 04/01/2013 0456   CL 106 05/09/2021 0517   CL 110 (H) 04/01/2013 0456   CO2 28 05/09/2021 0517   CO2 26 04/01/2013 0456   GLUCOSE 148 (H) 05/09/2021 0517   GLUCOSE 93 04/01/2013 0456   BUN 18 05/09/2021 0517   BUN 7 04/01/2013 0456   CREATININE 1.24 (H) 05/09/2021 0517   CREATININE 0.73 04/01/2013 0456   CALCIUM 9.6 05/09/2021 0517   CALCIUM  9.0 04/01/2013 0456   PROT 7.0 12/16/2020 1806   PROT 6.7 03/30/2013 1602   ALBUMIN 3.9 12/16/2020 1806  ALBUMIN 3.3 (L) 03/30/2013 1602   AST 22 12/16/2020 1806   AST 13 (L) 03/30/2013 1602   ALT 23 12/16/2020 1806   ALT 15 03/30/2013 1602   ALKPHOS 56 12/16/2020 1806   ALKPHOS 90 03/30/2013 1602   BILITOT 0.6 12/16/2020 1806   BILITOT 0.2 03/30/2013 1602   GFRNONAA 44 (L) 05/09/2021 0517   GFRNONAA >60 04/01/2013 0456   GFRAA >60 01/11/2018 1332   GFRAA >60 04/01/2013 0456     No results found.     Assessment & Plan:   1. Carotid stenosis, right Recommend:  The patient is s/p successful right CEA  Duplex ultrasound preoperatively shows 1-39% contralateral stenosis.  Continue antiplatelet therapy as prescribed Continue management of CAD, HTN and Hyperlipidemia Healthy heart diet,  encouraged exercise at least 4 times per week  Follow up in 3 months with duplex ultrasound and physical exam based on the patient's carotid surgery   2. Essential hypertension Continue antihypertensive medications as already ordered, these medications have been reviewed and there are no changes at this time.   3. Type 2 diabetes mellitus with diabetic peripheral angiopathy without gangrene, unspecified whether long term insulin use ([place]) Continue hypoglycemic medications as already ordered, these medications have been reviewed and there are no changes at this time.  Hgb A1C to be monitored as already arranged by primary service    Current Outpatient Medications on File Prior to Visit  Medication Sig Dispense Refill   Acetaminophen (TYLENOL PO) Take 500 mg by mouth.      albuterol (VENTOLIN HFA) 108 (90 Base) MCG/ACT inhaler SMARTSIG:2 Inhalation Via Inhaler Every 4 Hours PRN     apixaban (ELIQUIS) 5 MG TABS tablet Take 5 mg by mouth 2 (two) times daily.     aspirin EC 81 MG tablet Take 1 tablet (81 mg total) by mouth daily. Swallow whole. 30 tablet 11   buPROPion ER (WELLBUTRIN SR)  100 MG 12 hr tablet Take 1 tablet by mouth daily.     empagliflozin (JARDIANCE) 10 MG TABS tablet Take 10 mg by mouth daily.     ferrous sulfate 325 (65 FE) MG tablet Take 325 mg by mouth.   0   fluticasone (FLONASE) 50 MCG/ACT nasal spray Place 2 sprays into the nose daily.     fluticasone-salmeterol (ADVAIR) 250-50 MCG/ACT AEPB Inhale 1 puff into the lungs in the morning and at bedtime.     Fluticasone-Salmeterol (ADVAIR) 250-50 MCG/DOSE AEPB Inhale 1 puff into the lungs daily.     furosemide (LASIX) 20 MG tablet Take 1 tablet (20 mg total) by mouth daily. 15 tablet 0   glipiZIDE (GLUCOTROL) 5 MG tablet Take by mouth daily before breakfast.     HYDROcodone-acetaminophen (NORCO) 5-325 MG tablet Take 1 tablet by mouth every 6 (six) hours as needed for moderate pain. 20 tablet 0   levothyroxine (SYNTHROID) 137 MCG tablet Take 1 tablet (137 mcg total) by mouth daily. 30 tablet 3   loratadine (CLARITIN) 10 MG tablet Take 10 mg by mouth daily.     metFORMIN (GLUCOPHAGE) 500 MG tablet Take 500 mg by mouth 2 (two) times daily with a meal.     metoprolol tartrate (LOPRESSOR) 25 MG tablet Take 1 tablet (25 mg total) by mouth 2 (two) times daily. 60 tablet 0   Multiple Vitamin (MULTIVITAMIN) capsule Take 1 capsule by mouth daily.     Omega-3 Fatty Acids (FISH OIL) 1000 MG CAPS Take 2 capsules by mouth 2 (two) times daily.  omeprazole (PRILOSEC) 20 MG capsule Take 20 mg by mouth 2 (two) times daily before a meal.     rosuvastatin (CRESTOR) 5 MG tablet Take 5 mg by mouth daily.     venlafaxine (EFFEXOR) 75 MG tablet Take 75 mg by mouth 2 (two) times daily with a meal.     ezetimibe (ZETIA) 10 MG tablet Take by mouth.     losartan (COZAAR) 25 MG tablet Take by mouth.     No current facility-administered medications on file prior to visit.    There are no Patient Instructions on file for this visit. No follow-ups on file.   Kris Hartmann, NP

## 2021-06-21 ENCOUNTER — Other Ambulatory Visit: Payer: Self-pay | Admitting: Family Medicine

## 2021-06-21 DIAGNOSIS — R911 Solitary pulmonary nodule: Secondary | ICD-10-CM

## 2021-07-10 ENCOUNTER — Other Ambulatory Visit: Payer: Self-pay

## 2021-07-10 ENCOUNTER — Ambulatory Visit
Admission: RE | Admit: 2021-07-10 | Discharge: 2021-07-10 | Disposition: A | Discharge status: Home / Self Care | Payer: Medicare Other | Source: Ambulatory Visit | Attending: Family Medicine | Admitting: Family Medicine

## 2021-07-10 DIAGNOSIS — R911 Solitary pulmonary nodule: Secondary | ICD-10-CM | POA: Insufficient documentation

## 2021-07-10 LAB — POCT I-STAT CREATININE: Creatinine, Ser: 1.5 mg/dL — ABNORMAL HIGH (ref 0.44–1.00)

## 2021-07-10 MED ORDER — IOHEXOL 300 MG/ML  SOLN
75.0000 mL | Freq: Once | INTRAMUSCULAR | Status: AC | PRN
  Administered 2021-07-10: 60 mL via INTRAVENOUS

## 2021-08-27 ENCOUNTER — Other Ambulatory Visit (INDEPENDENT_AMBULATORY_CARE_PROVIDER_SITE_OTHER): Payer: Self-pay | Admitting: Nurse Practitioner

## 2021-08-27 DIAGNOSIS — I6523 Occlusion and stenosis of bilateral carotid arteries: Secondary | ICD-10-CM

## 2021-08-28 ENCOUNTER — Ambulatory Visit (INDEPENDENT_AMBULATORY_CARE_PROVIDER_SITE_OTHER): Payer: Medicare Other

## 2021-08-28 ENCOUNTER — Ambulatory Visit (INDEPENDENT_AMBULATORY_CARE_PROVIDER_SITE_OTHER): Payer: Medicare Other | Admitting: Nurse Practitioner

## 2021-08-28 ENCOUNTER — Encounter (INDEPENDENT_AMBULATORY_CARE_PROVIDER_SITE_OTHER): Payer: Self-pay | Admitting: Nurse Practitioner

## 2021-08-28 VITALS — BP 126/73 | HR 80 | Resp 16 | Wt 135.0 lb

## 2021-08-28 DIAGNOSIS — I6523 Occlusion and stenosis of bilateral carotid arteries: Secondary | ICD-10-CM | POA: Diagnosis not present

## 2021-08-28 DIAGNOSIS — I70219 Atherosclerosis of native arteries of extremities with intermittent claudication, unspecified extremity: Secondary | ICD-10-CM

## 2021-08-28 DIAGNOSIS — M7541 Impingement syndrome of right shoulder: Secondary | ICD-10-CM | POA: Diagnosis not present

## 2021-09-04 ENCOUNTER — Encounter (INDEPENDENT_AMBULATORY_CARE_PROVIDER_SITE_OTHER): Payer: Self-pay

## 2021-09-06 NOTE — Telephone Encounter (Signed)
Spoke with [facility] and they will call her

## 2021-09-06 NOTE — Telephone Encounter (Signed)
Can we call her PCP and ask them to reach out to her in regards her shoulder and ask that they reach out to her

## 2021-09-08 ENCOUNTER — Encounter (INDEPENDENT_AMBULATORY_CARE_PROVIDER_SITE_OTHER): Payer: Self-pay | Admitting: Nurse Practitioner

## 2021-09-08 NOTE — Progress Notes (Signed)
Subjective:    Patient ID: Christine Obrien, female    DOB: 01-01-1941, 81 y.o.   MRN: [id number] Chief Complaint  Patient presents with   Follow-up    Ultrasound follow up    Christine Obrien is a 81 year old female that returns today for follow-up evaluation of her carotid stenosis.  On 05/08/2021 the patient underwent a right carotid endarterectomy.  Initial follow-up showed no evidence of worsening stenosis and a patent right ICA.  The patient notes that she has been having recent shoulder pain and was little concerned that this may be related to her recent surgery or possible issues with her carotid stenosis.  She denies any dizziness.  She denies any TIA or amaurosis fugax-like symptoms.  Today noninvasive studies show the right ICA is near normal with only minimal wall thickening.  Left ICA shows 1 to 39% stenosis.  The subclavian arteries have normal flow hemodynamics with antegrade flow in the right vertebral artery and bidirectional flow in the left vertebral artery.   Review of Systems  All other systems reviewed and are negative.     Objective:   Physical Exam Vitals reviewed.  HENT:     Head: Normocephalic.  Neck:     Vascular: No carotid bruit.  Cardiovascular:     Rate and Rhythm: Normal rate.     Pulses:          Radial pulses are 1+ on the right side and 1+ on the left side.  Pulmonary:     Effort: Pulmonary effort is normal.  Skin:    General: Skin is warm and dry.  Neurological:     Mental Status: She is alert and oriented to person, place, and time.  Psychiatric:        Mood and Affect: Mood normal.        Behavior: Behavior normal.        Thought Content: Thought content normal.        Judgment: Judgment normal.    BP 126/73 (BP Location: Right Arm)   Pulse 80   Resp 16   Wt 135 lb (61.2 kg)   BMI 23.91 kg/m   Past Medical History:  Diagnosis Date   AICD (automatic cardioverter/defibrillator) present    Anxiety    Asthma    Atherosclerotic peripheral  vascular disease with intermittent claudication (HCC)    Cancer (HCC)    cervical   Carotid artery occlusion    CHF (congestive heart failure), NYHA class III (HCC)    CKD (chronic kidney disease), stage III (HCC)    COPD (chronic obstructive pulmonary disease) (HCC)    Coronary artery disease    Current use of long term anticoagulation    Depression    Diabetes mellitus without complication (HCC)    GERD (gastroesophageal reflux disease)    Gout    HLD (hyperlipidemia)    Hx of CABG    Hypertension    Hypothyroidism    Kidney stones    LBBB (left bundle branch block)    Myocardial infarction (HCC)    Presence of permanent cardiac pacemaker    Subclavian steal syndrome     Social History   Socioeconomic History   Marital status: Widowed    Spouse name: Not on file   Number of children: Not on file   Years of education: Not on file   Highest education level: Not on file  Occupational History   Not on file  Tobacco Use   Smoking status: Every  Day    Packs/day: 1.00    Types: Cigarettes   Smokeless tobacco: Never  Vaping Use   Vaping Use: Never used  Substance and Sexual Activity   Alcohol use: No   Drug use: No   Sexual activity: Not on file  Other Topics Concern   Not on file  Social History Narrative   Not on file   Social Determinants of Health   Financial Resource Strain: Not on file  Food Insecurity: Not on file  Transportation Needs: Not on file  Physical Activity: Not on file  Stress: Not on file  Social Connections: Not on file  Intimate Partner Violence: Not on file    Past Surgical History:  Procedure Laterality Date   ABDOMINAL HYSTERECTOMY     partial   CHOLECYSTECTOMY     COLONOSCOPY WITH PROPOFOL N/A 01/11/2020   Procedure: COLONOSCOPY WITH PROPOFOL;  Surgeon: Toledo, Benay Pike, MD;  Location: [facility];  Service: Gastroenterology;  Laterality: N/A;   CORONARY ANGIOPLASTY     CORONARY ARTERY BYPASS GRAFT     double    ENDARTERECTOMY Right 05/08/2021   Procedure: ENDARTERECTOMY CAROTID;  Surgeon: Algernon Huxley, MD;  Location: [facility];  Service: Vascular;  Laterality: Right;   EYE SURGERY Bilateral 08/2009   cataracts   HIATAL HERNIA REPAIR  1980s   LEFT HEART CATH AND CORONARY ANGIOGRAPHY N/A 07/03/2017   Procedure: LEFT HEART CATH AND CORONARY ANGIOGRAPHY;  Surgeon: Corey Skains, MD;  Location: [place] [facility];  Service: Cardiovascular;  Laterality: N/A;   LOWER EXTREMITY ANGIOGRAPHY Left 01/11/2018   Procedure: LOWER EXTREMITY ANGIOGRAPHY;  Surgeon: Algernon Huxley, MD;  Location: [place] [facility];  Service: Cardiovascular;  Laterality: Left;   THYROID SURGERY     pt does not remember if removed or partial removal    Family History  Problem Relation Age of Onset   Hypertension Mother    Hypertension Father    Diabetes Sister    Cancer Brother    Diabetes Brother    Breast cancer Neg Hx     Allergies  Allergen Reactions   Ace Inhibitors     Other reaction(s): Cough   Atorvastatin     Other reaction(s): Muscle Pain   Statins     Other reaction(s): Muscle Pain Muscle pain   Sulfa Antibiotics     Stomach pains       Latest Ref Rng & Units 05/09/2021    5:17 AM 05/02/2021    3:06 PM 12/16/2020    6:06 PM  CBC  WBC 4.0 - 10.5 K/uL 11.5   6.8   6.9    Hemoglobin 12.0 - 15.0 g/dL 12.0   14.2   13.7    Hematocrit 36.0 - 46.0 % 37.5   42.9   39.9    Platelets 150 - 400 K/uL 144   213   169        CMP     Component Value Date/Time   NA 141 05/09/2021 0517   NA 142 04/01/2013 0456   K 4.6 05/09/2021 0517   K 3.6 04/01/2013 0456   CL 106 05/09/2021 0517   CL 110 (H) 04/01/2013 0456   CO2 28 05/09/2021 0517   CO2 26 04/01/2013 0456   GLUCOSE 148 (H) 05/09/2021 0517   GLUCOSE 93 04/01/2013 0456   BUN 18 05/09/2021 0517   BUN 7 04/01/2013 0456   CREATININE 1.50 (H) 07/10/2021 1408   CREATININE 0.73 04/01/2013 0456  CALCIUM 9.6 05/09/2021 0517   CALCIUM 9.0 04/01/2013 0456    PROT 7.0 12/16/2020 1806   PROT 6.7 03/30/2013 1602   ALBUMIN 3.9 12/16/2020 1806   ALBUMIN 3.3 (L) 03/30/2013 1602   AST 22 12/16/2020 1806   AST 13 (L) 03/30/2013 1602   ALT 23 12/16/2020 1806   ALT 15 03/30/2013 1602   ALKPHOS 56 12/16/2020 1806   ALKPHOS 90 03/30/2013 1602   BILITOT 0.6 12/16/2020 1806   BILITOT 0.2 03/30/2013 1602   GFRNONAA 44 (L) 05/09/2021 0517   GFRNONAA >60 04/01/2013 0456   GFRAA >60 01/11/2018 1332   GFRAA >60 04/01/2013 0456     No results found.     Assessment & Plan:   1. Bilateral carotid artery stenosis Recommend:  Given the patient's asymptomatic subcritical stenosis no further invasive testing or surgery at this time.  Duplex ultrasound shows less than 40% stenosis bilaterally.  Continue antiplatelet therapy as prescribed Continue management of CAD, HTN and Hyperlipidemia Healthy heart diet,  encouraged exercise at least 4 times per week Follow up in 6 months with duplex ultrasound and physical exam   - VAS US CAROTID  2. Impingement syndrome of right shoulder region I suspect this is the cause of the patient's shoulder pain.  We will send patient to orthopedic surgery for further work-up and evaluation. - Ambulatory referral to Orthopedic Surgery  3. Atherosclerotic peripheral vascular disease with intermittent claudication (HCC) Patient's claudication has not worsened.  At the next follow-up visit we will check her ABIs in addition to follow-up of her carotid stenosis.   Current Outpatient Medications on File Prior to Visit  Medication Sig Dispense Refill   Acetaminophen (TYLENOL PO) Take 500 mg by mouth.      albuterol (VENTOLIN HFA) 108 (90 Base) MCG/ACT inhaler SMARTSIG:2 Inhalation Via Inhaler Every 4 Hours PRN     apixaban (ELIQUIS) 5 MG TABS tablet Take 5 mg by mouth 2 (two) times daily.     aspirin EC 81 MG tablet Take 1 tablet (81 mg total) by mouth daily. Swallow whole. 30 tablet 11   buPROPion ER (WELLBUTRIN SR) 100  MG 12 hr tablet Take 1 tablet by mouth daily.     empagliflozin (JARDIANCE) 10 MG TABS tablet Take 10 mg by mouth daily.     ferrous sulfate 325 (65 FE) MG tablet Take 325 mg by mouth.   0   fluticasone (FLONASE) 50 MCG/ACT nasal spray Place 2 sprays into the nose daily.     fluticasone-salmeterol (ADVAIR) 250-50 MCG/ACT AEPB Inhale 1 puff into the lungs in the morning and at bedtime.     Fluticasone-Salmeterol (ADVAIR) 250-50 MCG/DOSE AEPB Inhale 1 puff into the lungs daily.     furosemide (LASIX) 20 MG tablet Take 1 tablet (20 mg total) by mouth daily. 15 tablet 0   glipiZIDE (GLUCOTROL) 5 MG tablet Take by mouth daily before breakfast.     levothyroxine (SYNTHROID) 137 MCG tablet Take 1 tablet (137 mcg total) by mouth daily. 30 tablet 3   loratadine (CLARITIN) 10 MG tablet Take 10 mg by mouth daily.     metFORMIN (GLUCOPHAGE) 500 MG tablet Take 500 mg by mouth 2 (two) times daily with a meal.     metoprolol tartrate (LOPRESSOR) 25 MG tablet Take 1 tablet (25 mg total) by mouth 2 (two) times daily. 60 tablet 0   Multiple Vitamin (MULTIVITAMIN) capsule Take 1 capsule by mouth daily.     Omega-3 Fatty Acids (FISH OIL) 1000  MG CAPS Take 1 capsule by mouth daily.     omeprazole (PRILOSEC) 20 MG capsule Take 20 mg by mouth 2 (two) times daily before a meal.     rosuvastatin (CRESTOR) 5 MG tablet Take 5 mg by mouth daily.     venlafaxine (EFFEXOR) 75 MG tablet Take 75 mg by mouth 2 (two) times daily with a meal.     ezetimibe (ZETIA) 10 MG tablet Take by mouth.     gabapentin (NEURONTIN) 100 MG capsule SMARTSIG:1-2 Capsule(s) By Mouth 1-3 Times Daily     HYDROcodone-acetaminophen (NORCO) 5-325 MG tablet Take 1 tablet by mouth every 6 (six) hours as needed for moderate pain. (Patient not taking: Reported on 08/28/2021) 20 tablet 0   losartan (COZAAR) 25 MG tablet Take by mouth.     No current facility-administered medications on file prior to visit.    There are no Patient Instructions on file for  this visit. No follow-ups on file.   Kris Hartmann, NP

## 2021-12-10 ENCOUNTER — Inpatient Hospital Stay
Admit: 2021-12-10 | Discharge: 2021-12-10 | Disposition: A | Discharge status: Home / Self Care | Payer: Medicare Other | Attending: Internal Medicine | Admitting: Internal Medicine

## 2021-12-10 ENCOUNTER — Emergency Department: Payer: Medicare Other

## 2021-12-10 ENCOUNTER — Inpatient Hospital Stay
Admission: EM | Admit: 2021-12-10 | Discharge: 2021-12-13 | DRG: 190 | Disposition: A | Discharge status: Home / Self Care | Payer: Medicare Other | Attending: Internal Medicine | Admitting: Internal Medicine

## 2021-12-10 ENCOUNTER — Other Ambulatory Visit: Payer: Self-pay

## 2021-12-10 DIAGNOSIS — I480 Paroxysmal atrial fibrillation: Secondary | ICD-10-CM | POA: Diagnosis present

## 2021-12-10 DIAGNOSIS — J9621 Acute and chronic respiratory failure with hypoxia: Secondary | ICD-10-CM | POA: Diagnosis not present

## 2021-12-10 DIAGNOSIS — M109 Gout, unspecified: Secondary | ICD-10-CM | POA: Diagnosis present

## 2021-12-10 DIAGNOSIS — E872 Acidosis, unspecified: Secondary | ICD-10-CM | POA: Diagnosis present

## 2021-12-10 DIAGNOSIS — Z79890 Hormone replacement therapy (postmenopausal): Secondary | ICD-10-CM

## 2021-12-10 DIAGNOSIS — E1122 Type 2 diabetes mellitus with diabetic chronic kidney disease: Secondary | ICD-10-CM | POA: Diagnosis present

## 2021-12-10 DIAGNOSIS — E876 Hypokalemia: Secondary | ICD-10-CM | POA: Diagnosis present

## 2021-12-10 DIAGNOSIS — I252 Old myocardial infarction: Secondary | ICD-10-CM

## 2021-12-10 DIAGNOSIS — F17210 Nicotine dependence, cigarettes, uncomplicated: Secondary | ICD-10-CM | POA: Diagnosis present

## 2021-12-10 DIAGNOSIS — E785 Hyperlipidemia, unspecified: Secondary | ICD-10-CM | POA: Diagnosis present

## 2021-12-10 DIAGNOSIS — J441 Chronic obstructive pulmonary disease with (acute) exacerbation: Principal | ICD-10-CM | POA: Diagnosis present

## 2021-12-10 DIAGNOSIS — I447 Left bundle-branch block, unspecified: Secondary | ICD-10-CM | POA: Diagnosis present

## 2021-12-10 DIAGNOSIS — I5033 Acute on chronic diastolic (congestive) heart failure: Secondary | ICD-10-CM | POA: Diagnosis present

## 2021-12-10 DIAGNOSIS — J9601 Acute respiratory failure with hypoxia: Secondary | ICD-10-CM | POA: Diagnosis present

## 2021-12-10 DIAGNOSIS — F419 Anxiety disorder, unspecified: Secondary | ICD-10-CM | POA: Diagnosis present

## 2021-12-10 DIAGNOSIS — I130 Hypertensive heart and chronic kidney disease with heart failure and stage 1 through stage 4 chronic kidney disease, or unspecified chronic kidney disease: Secondary | ICD-10-CM | POA: Diagnosis present

## 2021-12-10 DIAGNOSIS — Z9049 Acquired absence of other specified parts of digestive tract: Secondary | ICD-10-CM

## 2021-12-10 DIAGNOSIS — Z8249 Family history of ischemic heart disease and other diseases of the circulatory system: Secondary | ICD-10-CM

## 2021-12-10 DIAGNOSIS — J209 Acute bronchitis, unspecified: Secondary | ICD-10-CM | POA: Diagnosis present

## 2021-12-10 DIAGNOSIS — I5023 Acute on chronic systolic (congestive) heart failure: Secondary | ICD-10-CM

## 2021-12-10 DIAGNOSIS — K219 Gastro-esophageal reflux disease without esophagitis: Secondary | ICD-10-CM | POA: Diagnosis present

## 2021-12-10 DIAGNOSIS — E039 Hypothyroidism, unspecified: Secondary | ICD-10-CM | POA: Diagnosis present

## 2021-12-10 DIAGNOSIS — Z79899 Other long term (current) drug therapy: Secondary | ICD-10-CM

## 2021-12-10 DIAGNOSIS — E1151 Type 2 diabetes mellitus with diabetic peripheral angiopathy without gangrene: Secondary | ICD-10-CM | POA: Diagnosis present

## 2021-12-10 DIAGNOSIS — Z882 Allergy status to sulfonamides status: Secondary | ICD-10-CM

## 2021-12-10 DIAGNOSIS — I959 Hypotension, unspecified: Secondary | ICD-10-CM | POA: Diagnosis present

## 2021-12-10 DIAGNOSIS — J810 Acute pulmonary edema: Secondary | ICD-10-CM

## 2021-12-10 DIAGNOSIS — I2510 Atherosclerotic heart disease of native coronary artery without angina pectoris: Secondary | ICD-10-CM | POA: Diagnosis present

## 2021-12-10 DIAGNOSIS — Z90710 Acquired absence of both cervix and uterus: Secondary | ICD-10-CM

## 2021-12-10 DIAGNOSIS — Z7901 Long term (current) use of anticoagulants: Secondary | ICD-10-CM

## 2021-12-10 DIAGNOSIS — F32A Depression, unspecified: Secondary | ICD-10-CM | POA: Diagnosis present

## 2021-12-10 DIAGNOSIS — Z888 Allergy status to other drugs, medicaments and biological substances status: Secondary | ICD-10-CM

## 2021-12-10 DIAGNOSIS — I472 Ventricular tachycardia, unspecified: Secondary | ICD-10-CM | POA: Diagnosis not present

## 2021-12-10 DIAGNOSIS — Z95810 Presence of automatic (implantable) cardiac defibrillator: Secondary | ICD-10-CM

## 2021-12-10 DIAGNOSIS — Z7982 Long term (current) use of aspirin: Secondary | ICD-10-CM

## 2021-12-10 DIAGNOSIS — Z7951 Long term (current) use of inhaled steroids: Secondary | ICD-10-CM

## 2021-12-10 DIAGNOSIS — Z7984 Long term (current) use of oral hypoglycemic drugs: Secondary | ICD-10-CM

## 2021-12-10 DIAGNOSIS — Z951 Presence of aortocoronary bypass graft: Secondary | ICD-10-CM

## 2021-12-10 DIAGNOSIS — N182 Chronic kidney disease, stage 2 (mild): Secondary | ICD-10-CM | POA: Diagnosis present

## 2021-12-10 DIAGNOSIS — R0602 Shortness of breath: Secondary | ICD-10-CM

## 2021-12-10 DIAGNOSIS — Z833 Family history of diabetes mellitus: Secondary | ICD-10-CM

## 2021-12-10 LAB — BASIC METABOLIC PANEL
Anion gap: 8 (ref 5–15)
BUN: 10 mg/dL (ref 8–23)
CO2: 27 mmol/L (ref 22–32)
Calcium: 9.2 mg/dL (ref 8.9–10.3)
Chloride: 110 mmol/L (ref 98–111)
Creatinine, Ser: 0.92 mg/dL (ref 0.44–1.00)
GFR, Estimated: >60 mL/min (ref >60)
Glucose, Bld: 146 mg/dL — ABNORMAL HIGH (ref 70–99)
Potassium: 3.1 mmol/L — ABNORMAL LOW (ref 3.5–5.1)
Sodium: 145 mmol/L (ref 135–145)

## 2021-12-10 LAB — CBC WITH DIFFERENTIAL/PLATELET
Abs Immature Granulocytes: 0.04 10*3/uL (ref 0.00–0.07)
Basophils Absolute: 0 10*3/uL (ref 0.0–0.1)
Basophils Relative: 0 %
Eosinophils Absolute: 0.2 10*3/uL (ref 0.0–0.5)
Eosinophils Relative: 2 %
HCT: 37.6 % (ref 36.0–46.0)
Hemoglobin: 11.8 g/dL — ABNORMAL LOW (ref 12.0–15.0)
Immature Granulocytes: 0 %
Lymphocytes Relative: 16 %
Lymphs Abs: 1.6 10*3/uL (ref 0.7–4.0)
MCH: 30.3 pg (ref 26.0–34.0)
MCHC: 31.4 g/dL (ref 30.0–36.0)
MCV: 96.4 fL (ref 80.0–100.0)
Monocytes Absolute: 0.3 10*3/uL (ref 0.1–1.0)
Monocytes Relative: 3 %
Neutro Abs: 8.1 10*3/uL — ABNORMAL HIGH (ref 1.7–7.7)
Neutrophils Relative %: 79 %
Platelets: 166 10*3/uL (ref 150–400)
RBC: 3.9 MIL/uL (ref 3.87–5.11)
RDW: 13.8 % (ref 11.5–15.5)
WBC: 10.3 10*3/uL (ref 4.0–10.5)
nRBC: 0 % (ref 0.0–0.2)

## 2021-12-10 LAB — PROCALCITONIN: Procalcitonin: <0.1 ng/mL

## 2021-12-10 LAB — GLUCOSE, CAPILLARY: Glucose-Capillary: 239 mg/dL — ABNORMAL HIGH (ref 70–99)

## 2021-12-10 LAB — BLOOD GAS, VENOUS
Acid-Base Excess: 4.7 mmol/L — ABNORMAL HIGH (ref 0.0–2.0)
Bicarbonate: 30.9 mmol/L — ABNORMAL HIGH (ref 20.0–28.0)
O2 Saturation: 69.6 %
Patient temperature: 37
pCO2, Ven: 51 mmHg (ref 44–60)
pH, Ven: 7.39 (ref 7.25–7.43)
pO2, Ven: 43 mmHg (ref 32–45)

## 2021-12-10 LAB — TROPONIN I (HIGH SENSITIVITY)
Troponin I (High Sensitivity): 24 ng/L — ABNORMAL HIGH (ref <18)
Troponin I (High Sensitivity): 25 ng/L — ABNORMAL HIGH (ref <18)

## 2021-12-10 LAB — BRAIN NATRIURETIC PEPTIDE: B Natriuretic Peptide: 985.6 pg/mL — ABNORMAL HIGH (ref 0.0–100.0)

## 2021-12-10 LAB — LACTIC ACID, PLASMA
Lactic Acid, Venous: 2.5 mmol/L (ref 0.5–1.9)
Lactic Acid, Venous: 3 mmol/L (ref 0.5–1.9)

## 2021-12-10 MED ORDER — EZETIMIBE 10 MG PO TABS
10.0000 mg | ORAL_TABLET | Freq: Every day | ORAL | Status: DC
  Administered 2021-12-10 – 2021-12-13 (×4): 10 mg via ORAL
  Filled 2021-12-10 (×4): qty 1

## 2021-12-10 MED ORDER — ACETAMINOPHEN 650 MG RE SUPP: 650.0000 mg | Freq: Four times a day (QID) | RECTAL | Status: DC | PRN

## 2021-12-10 MED ORDER — ALBUTEROL SULFATE (2.5 MG/3ML) 0.083% IN NEBU: 3.0000 mL | INHALATION_SOLUTION | Freq: Four times a day (QID) | RESPIRATORY_TRACT | Status: DC | PRN

## 2021-12-10 MED ORDER — PREDNISONE 20 MG PO TABS
20.0000 mg | ORAL_TABLET | Freq: Every day | ORAL | Status: DC
  Administered 2021-12-11: 20 mg via ORAL
  Filled 2021-12-10: qty 1

## 2021-12-10 MED ORDER — ALBUTEROL SULFATE (2.5 MG/3ML) 0.083% IN NEBU
2.5000 mg | INHALATION_SOLUTION | RESPIRATORY_TRACT | Status: DC | PRN
  Administered 2021-12-11 – 2021-12-12 (×2): 2.5 mg via RESPIRATORY_TRACT
  Filled 2021-12-10 (×2): qty 3

## 2021-12-10 MED ORDER — LORATADINE 10 MG PO TABS: 10.0000 mg | ORAL_TABLET | Freq: Every day | ORAL | Status: DC | PRN

## 2021-12-10 MED ORDER — POTASSIUM CHLORIDE IN NACL 40-0.9 MEQ/L-% IV SOLN
INTRAVENOUS | Status: DC
  Filled 2021-12-10 (×3): qty 1000

## 2021-12-10 MED ORDER — TRAZODONE HCL 50 MG PO TABS
25.0000 mg | ORAL_TABLET | Freq: Every evening | ORAL | Status: DC | PRN
  Administered 2021-12-11: 25 mg via ORAL
  Filled 2021-12-10: qty 1

## 2021-12-10 MED ORDER — ASPIRIN 81 MG PO TBEC
81.0000 mg | DELAYED_RELEASE_TABLET | Freq: Every day | ORAL | Status: DC
  Administered 2021-12-10 – 2021-12-13 (×4): 81 mg via ORAL
  Filled 2021-12-10 (×4): qty 1

## 2021-12-10 MED ORDER — AZITHROMYCIN 250 MG PO TABS
250.0000 mg | ORAL_TABLET | Freq: Every day | ORAL | Status: DC
  Administered 2021-12-11 – 2021-12-13 (×3): 250 mg via ORAL
  Filled 2021-12-10 (×3): qty 1

## 2021-12-10 MED ORDER — GABAPENTIN 100 MG PO CAPS
100.0000 mg | ORAL_CAPSULE | Freq: Three times a day (TID) | ORAL | Status: DC | PRN
  Administered 2021-12-11: 100 mg via ORAL
  Filled 2021-12-10: qty 1

## 2021-12-10 MED ORDER — VANCOMYCIN HCL 1250 MG/250ML IV SOLN
1250.0000 mg | Freq: Once | INTRAVENOUS | Status: AC
  Administered 2021-12-10: 1250 mg via INTRAVENOUS
  Filled 2021-12-10: qty 250

## 2021-12-10 MED ORDER — SODIUM CHLORIDE 0.9 % IV SOLN
1.0000 g | INTRAVENOUS | Status: DC
  Administered 2021-12-11 – 2021-12-12 (×2): 1 g via INTRAVENOUS
  Filled 2021-12-10 (×2): qty 1

## 2021-12-10 MED ORDER — FERROUS SULFATE 325 (65 FE) MG PO TABS
325.0000 mg | ORAL_TABLET | Freq: Every day | ORAL | Status: DC
  Administered 2021-12-11 – 2021-12-13 (×3): 325 mg via ORAL
  Filled 2021-12-10 (×3): qty 1

## 2021-12-10 MED ORDER — APIXABAN 5 MG PO TABS
5.0000 mg | ORAL_TABLET | Freq: Two times a day (BID) | ORAL | Status: DC
  Administered 2021-12-10 – 2021-12-13 (×6): 5 mg via ORAL
  Filled 2021-12-10 (×6): qty 1

## 2021-12-10 MED ORDER — ONDANSETRON HCL 4 MG/2ML IJ SOLN: 4.0000 mg | Freq: Four times a day (QID) | INTRAMUSCULAR | Status: DC | PRN

## 2021-12-10 MED ORDER — PREDNISONE 20 MG PO TABS
60.0000 mg | ORAL_TABLET | Freq: Once | ORAL | Status: AC
  Administered 2021-12-10: 60 mg via ORAL
  Filled 2021-12-10: qty 3

## 2021-12-10 MED ORDER — MOMETASONE FURO-FORMOTEROL FUM 200-5 MCG/ACT IN AERO
2.0000 | INHALATION_SPRAY | Freq: Two times a day (BID) | RESPIRATORY_TRACT | Status: DC
  Administered 2021-12-10 – 2021-12-13 (×5): 2 via RESPIRATORY_TRACT
  Filled 2021-12-10: qty 8.8

## 2021-12-10 MED ORDER — ACETAMINOPHEN 325 MG PO TABS
650.0000 mg | ORAL_TABLET | Freq: Four times a day (QID) | ORAL | Status: DC | PRN
  Administered 2021-12-10 – 2021-12-12 (×3): 650 mg via ORAL
  Filled 2021-12-10 (×3): qty 2

## 2021-12-10 MED ORDER — SODIUM CHLORIDE 0.9 % IV SOLN
2.0000 g | Freq: Once | INTRAVENOUS | Status: AC
  Administered 2021-12-10: 2 g via INTRAVENOUS
  Filled 2021-12-10: qty 12.5

## 2021-12-10 MED ORDER — VENLAFAXINE HCL 37.5 MG PO TABS
75.0000 mg | ORAL_TABLET | Freq: Two times a day (BID) | ORAL | Status: DC
  Administered 2021-12-10 – 2021-12-13 (×6): 75 mg via ORAL
  Filled 2021-12-10 (×7): qty 2

## 2021-12-10 MED ORDER — ADULT MULTIVITAMIN W/MINERALS CH
1.0000 | ORAL_TABLET | Freq: Every day | ORAL | Status: DC
  Administered 2021-12-10 – 2021-12-13 (×4): 1 via ORAL
  Filled 2021-12-10 (×4): qty 1

## 2021-12-10 MED ORDER — POLYETHYLENE GLYCOL 3350 17 G PO PACK
17.0000 g | PACK | Freq: Every day | ORAL | Status: DC | PRN
Start: 2021-12-10 — End: 2021-12-13

## 2021-12-10 MED ORDER — DOCUSATE SODIUM 100 MG PO CAPS
100.0000 mg | ORAL_CAPSULE | Freq: Two times a day (BID) | ORAL | Status: DC
  Administered 2021-12-10 – 2021-12-13 (×5): 100 mg via ORAL
  Filled 2021-12-10 (×7): qty 1

## 2021-12-10 MED ORDER — LEVOTHYROXINE SODIUM 137 MCG PO TABS
137.0000 ug | ORAL_TABLET | Freq: Every day | ORAL | Status: DC
  Administered 2021-12-11 – 2021-12-13 (×3): 137 ug via ORAL
  Filled 2021-12-10 (×3): qty 1

## 2021-12-10 MED ORDER — IPRATROPIUM-ALBUTEROL 0.5-2.5 (3) MG/3ML IN SOLN
3.0000 mL | RESPIRATORY_TRACT | Status: AC
  Administered 2021-12-10 (×2): 3 mL via RESPIRATORY_TRACT
  Filled 2021-12-10 (×2): qty 3

## 2021-12-10 MED ORDER — ONDANSETRON HCL 4 MG PO TABS: 4.0000 mg | ORAL_TABLET | Freq: Four times a day (QID) | ORAL | Status: DC | PRN

## 2021-12-10 MED ORDER — DOXYCYCLINE HYCLATE 100 MG PO TABS
100.0000 mg | ORAL_TABLET | Freq: Once | ORAL | Status: AC
  Administered 2021-12-10: 100 mg via ORAL
  Filled 2021-12-10: qty 1

## 2021-12-10 MED ORDER — PANTOPRAZOLE SODIUM 40 MG PO TBEC
40.0000 mg | DELAYED_RELEASE_TABLET | Freq: Every day | ORAL | Status: DC
  Administered 2021-12-11 – 2021-12-13 (×3): 40 mg via ORAL
  Filled 2021-12-10 (×3): qty 1

## 2021-12-10 MED ORDER — SODIUM CHLORIDE 0.9 % IV SOLN: INTRAVENOUS | Status: DC

## 2021-12-10 MED ORDER — BUPROPION HCL ER (SR) 100 MG PO TB12
100.0000 mg | ORAL_TABLET | Freq: Two times a day (BID) | ORAL | Status: DC
  Administered 2021-12-10 – 2021-12-13 (×6): 100 mg via ORAL
  Filled 2021-12-10 (×6): qty 1

## 2021-12-10 MED ORDER — SODIUM CHLORIDE 0.9 % IV BOLUS: 250.0000 mL | Freq: Once | INTRAVENOUS | Status: DC

## 2021-12-10 MED ORDER — ENOXAPARIN SODIUM 40 MG/0.4ML IJ SOSY: 40.0000 mg | PREFILLED_SYRINGE | INTRAMUSCULAR | Status: DC

## 2021-12-10 NOTE — ED Notes (Signed)
Report given to C Pod RNs.

## 2021-12-10 NOTE — ED Notes (Signed)
ED Provider at bedside. 

## 2021-12-10 NOTE — ED Notes (Signed)
Hospitalist at bedside and aware of soft BP.

## 2021-12-10 NOTE — ED Provider Notes (Signed)
[facility] Provider Note    Event Date/Time   First MD Initiated Contact with Patient 12/10/21 [phone]     (approximate)   History   Shortness of Breath   HPI  Christine Obrien is a 81 y.o. female   Past medical history of COPD, HF, CAD, PVD on eliquis. No home o2. Presents with cough increased sputum and dyspnea 2 days better with albuterol. EMS meds Solu-Medrol and albuterol have improved her symptoms markedly.  Says that this symptoms are consistent with her prior COPD exacerbations.  No fever.  No chills.  She has no chest pain and has no other complaints.  He lives at home with her daughter.  And is able to perform all activities of daily living.  History was obtained via the patient and her daughter who is at bedside.  Also, history obtained via external note review.      Physical Exam   Triage Vital Signs: ED Triage Vitals  Enc Vitals Group     BP 12/10/21 0850 (!) 91/53     Pulse Rate 12/10/21 0850 67     Resp 12/10/21 0850 14     Temp 12/10/21 0850 97.6 F (36.4 C)     Temp Source 12/10/21 0850 Oral     SpO2 12/10/21 0850 93 %     Weight 12/10/21 0857 135 lb (61.2 kg)     Height 12/10/21 0857 '5\' 3"'$  (1.6 m)     Head Circumference --      Peak Flow --      Pain Score 12/10/21 0855 7     Pain Loc --      Pain Edu? --      Excl. in [place]? --     Most recent vital signs: Vitals:   12/10/21 0900 12/10/21 1003  BP: 112/70 103/65  Pulse: 66 70  Resp: 19 19  Temp:    SpO2: 91% 97%    General: Awake, no distress.  CV:  Good peripheral perfusion.  regular rate and rhythm without obvious murmur Resp:  Normal effort.  Scant wheezing at apices, no rales, no focality. Abd:  No distention.  Nontender Other:  Alert oriented and pleasant   ED Results / Procedures / Treatments   Labs (all labs ordered are listed, but only abnormal results are displayed) Labs Reviewed  CBC WITH DIFFERENTIAL/PLATELET - Abnormal; Notable for the following  components:      Result Value   Hemoglobin 11.8 (*)    Neutro Abs 8.1 (*)    All other components within normal limits  BASIC METABOLIC PANEL - Abnormal; Notable for the following components:   Potassium 3.1 (*)    Glucose, Bld 146 (*)    All other components within normal limits  BRAIN NATRIURETIC PEPTIDE - Abnormal; Notable for the following components:   B Natriuretic Peptide 985.6 (*)    All other components within normal limits  BLOOD GAS, VENOUS - Abnormal; Notable for the following components:   Bicarbonate 30.9 (*)    Acid-Base Excess 4.7 (*)    All other components within normal limits  LACTIC ACID, PLASMA - Abnormal; Notable for the following components:   Lactic Acid, Venous 2.5 (*)    All other components within normal limits  TROPONIN I (HIGH SENSITIVITY) - Abnormal; Notable for the following components:   Troponin I (High Sensitivity) 24 (*)    All other components within normal limits  LACTIC ACID, PLASMA  BRAIN NATRIURETIC PEPTIDE  TROPONIN I (HIGH SENSITIVITY)     I reviewed labs and they are notable for lactic acid of 2.5, hypokalemia 3.1, evaded proBNP 900s.  Initial troponin 24  EKG  ED ECG REPORT I, Lucillie Garfinkel, the attending physician, personally viewed and interpreted this ECG.   Date: 12/10/2021  EKG Time: 0852   Rate: 68  Rhythm: unchanged from previous tracings, LBBB  Intervals:left bundle branch block  ST&T Change: Left bundle branch block with discordant ST depressions in V6, 2, 3 that are consistent with prior.    RADIOLOGY I dependently reviewed and interpreted x-ray and see haziness in bilateral lower lung fields consistent with pulmonary edema or infectious etiology.  Please refer to radiology for final report.   PROCEDURES:  Critical Care performed: No  Procedures   MEDICATIONS ORDERED IN ED: Medications  ceFEPIme (MAXIPIME) 2 g in sodium chloride 0.9 % 100 mL IVPB (has no administration in time range)  sodium chloride 0.9 %  bolus 250 mL (has no administration in time range)  ipratropium-albuterol (DUONEB) 0.5-2.5 (3) MG/3ML nebulizer solution 3 mL (3 mLs Nebulization Given 12/10/21 0924)  predniSONE (DELTASONE) tablet 60 mg (60 mg Oral Given 12/10/21 0923)  doxycycline (VIBRA-TABS) tablet 100 mg (100 mg Oral Given 12/10/21 1007)    Consultants:  I spoke with hospitalist regarding admission and further care plan for this patient.   IMPRESSION / MDM / ASSESSMENT AND PLAN / ED COURSE  I reviewed the triage vital signs and the nursing notes.                              Anshul diagnosis includes heart failure exacerbation, COPD exacerbation, lung infection, in this patient with COPD and heart failure with shortness of breath responsive to albuterol.  Consider ACS, PE though less likely given no chest pain, unchanged EKG, and compliance with Eliquis.  MDM: Treatment with albuterol and prednisone with marked effect on the patient.  Chest x-ray with signs of pulmonary edema versus infectious, will treat for community-acquired pneumonia.  Respiratory status is greatly improved, she has COPD on room air around 90%.  Lungs are clear on reauscultation.  No ischemic changes on EKG, initial troponin mildly elevated we will continue to trend.  However, given her age and multiple risk factors and evidence of pulmonary edema and COPD exacerbation and lung infection with an elevated lactic and downtrending blood pressure, this patient should be admitted for further treatment and stabilization prior to safe discharge.  At this time, her respiratory status is stabilized and has clear lung sounds despite pulmonary edema and elevated BNP on work-up.  Downtrending blood pressures and elevated lactic acidosis elect to give small aliquots fluid boluses in place of diuresis and closely monitor respiratory status.  11:48 AM Pressure normalized, maps above 60s now.  Patient asymptomatic.  Will defer diuretics and fluids at this time.   Continue to monitor.  Admission to hospitalist service.    Patient's presentation is most consistent with acute presentation with potential threat to life or bodily function.       FINAL CLINICAL IMPRESSION(S) / ED DIAGNOSES   Final diagnoses:  COPD exacerbation ([place])  Shortness of breath  Acute pulmonary edema ([place])     Rx / DC Orders   ED Discharge Orders     None        Note:  This document was prepared using Dragon voice recognition software and may include unintentional dictation errors.  Lucillie Garfinkel, MD 12/10/21 [id number]

## 2021-12-10 NOTE — ED Triage Notes (Signed)
Per EMS, Pt, from home, c/o SOB x1 day.  Denies new pain.  Pt was 88% RA.  Albuterol x1 and solu-medrol '125mg'$  given en route.  Hx of COPD.

## 2021-12-10 NOTE — H&P (Signed)
History and Physical    Patient: Christine Obrien GQQ:[id number] DOB: 07-28-1940 DOA: 12/10/2021 DOS: the patient was seen and examined on 12/10/2021 PCP: [name], MD  Patient coming from: Home  Chief Complaint:  Chief Complaint  Patient presents with   Shortness of Breath   HPI: Christine Obrien is a 81 y.o. female with medical history significant of COPD, CHF chronic systolic, coronary artery disease, paroxysmal atrial fibrillation on chronic anticoagulation, diabetes without complication, depression, hypothyroidism comes to the emergency room with increasing shortness of breath for couple days along with cough which patient tells me is chronic. She denied any chest pain. She overall feels week. She was not able to clearly tell me whether she had fever with chills.  The ER patient was found to have some wheezing and received PO prednisone along with breathing treatment. She was also found to have elevated BNP. No leg edema. Patient also was found to have elevated lactic acid of 2.5 repeat was 3.0. She received empiric antibiotic in the ER. Received a liter of lactate [place].  Blood pressure has been soft however her map is more than 65.  She is being admitted with acute on chronic respiratory failure secondary to COPD flare, possible pulmonary edema and bronchitis versus pneumonia.   Review of Systems: As mentioned in the history of present illness. All other systems reviewed and are negative. Past Medical History:  Diagnosis Date   AICD (automatic cardioverter/defibrillator) present    Anxiety    Asthma    Atherosclerotic peripheral vascular disease with intermittent claudication (HCC)    Cancer (HCC)    cervical   Carotid artery occlusion    CHF (congestive heart failure), NYHA class III (HCC)    CKD (chronic kidney disease), stage III (HCC)    COPD (chronic obstructive pulmonary disease) (HCC)    Coronary artery disease    Current use of long term anticoagulation    Depression     Diabetes mellitus without complication (HCC)    GERD (gastroesophageal reflux disease)    Gout    HLD (hyperlipidemia)    Hx of CABG    Hypertension    Hypothyroidism    Kidney stones    LBBB (left bundle branch block)    Myocardial infarction ([place])    Presence of permanent cardiac pacemaker    Subclavian steal syndrome    Past Surgical History:  Procedure Laterality Date   ABDOMINAL HYSTERECTOMY     partial   CHOLECYSTECTOMY     COLONOSCOPY WITH PROPOFOL N/A 01/11/2020   Procedure: COLONOSCOPY WITH PROPOFOL;  Surgeon: Toledo, Benay Pike, MD;  Location: [facility] ENDOSCOPY;  Service: Gastroenterology;  Laterality: N/A;   CORONARY ANGIOPLASTY     CORONARY ARTERY BYPASS GRAFT     double   ENDARTERECTOMY Right 05/08/2021   Procedure: ENDARTERECTOMY CAROTID;  Surgeon: Algernon Huxley, MD;  Location: [facility] ORS;  Service: Vascular;  Laterality: Right;   EYE SURGERY Bilateral 08/2009   cataracts   HIATAL HERNIA REPAIR  1980s   LEFT HEART CATH AND CORONARY ANGIOGRAPHY N/A 07/03/2017   Procedure: LEFT HEART CATH AND CORONARY ANGIOGRAPHY;  Surgeon: Corey Skains, MD;  Location: [place] [facility];  Service: Cardiovascular;  Laterality: N/A;   LOWER EXTREMITY ANGIOGRAPHY Left 01/11/2018   Procedure: LOWER EXTREMITY ANGIOGRAPHY;  Surgeon: Algernon Huxley, MD;  Location: [place] [facility];  Service: Cardiovascular;  Laterality: Left;   THYROID SURGERY     pt does not remember if removed or partial  removal   Social History:  reports that she has been smoking cigarettes. She has been smoking an average of 1 pack per day. She has never used smokeless tobacco. She reports that she does not drink alcohol and does not use drugs.  Allergies  Allergen Reactions   Ace Inhibitors     Other reaction(s): Cough   Atorvastatin     Other reaction(s): Muscle Pain   Statins     Other reaction(s): Muscle Pain Muscle pain   Sulfa Antibiotics     Stomach pains    Family History  Problem Relation Age of  Onset   Hypertension Mother    Hypertension Father    Diabetes Sister    Cancer Brother    Diabetes Brother    Breast cancer Neg Hx     Prior to Admission medications   Medication Sig Start Date End Date Taking? Authorizing Provider  apixaban (ELIQUIS) 5 MG TABS tablet Take 5 mg by mouth 2 (two) times daily. 03/05/20  Yes [provider]  aspirin EC 81 MG tablet Take 1 tablet (81 mg total) by mouth daily. Swallow whole. 05/09/21  Yes Kris Hartmann, NP  buPROPion ER [facility] SR) 100 MG 12 hr tablet Take 100 mg by mouth 2 (two) times daily. 01/15/21  Yes [provider]  empagliflozin (JARDIANCE) 10 MG TABS tablet Take 10 mg by mouth daily.   Yes [provider]  ezetimibe (ZETIA) 10 MG tablet Take 10 mg by mouth daily. 08/20/17 12/10/21 Yes [provider]  ferrous sulfate 325 (65 FE) MG tablet Take 325 mg by mouth daily with breakfast. 03/09/16  Yes [provider]  fluticasone-salmeterol (ADVAIR) 250-50 MCG/ACT AEPB Inhale 1 puff into the lungs in the morning and at bedtime.   Yes [provider]  furosemide (LASIX) 20 MG tablet Take 1 tablet (20 mg total) by mouth daily. 07/25/20  Yes Fritzi Mandes, MD  glipiZIDE (GLUCOTROL) 5 MG tablet Take 5 mg by mouth 2 (two) times daily before a meal.   Yes [provider]  icosapent Ethyl (VASCEPA) 1 g capsule Take 1 g by mouth 2 (two) times daily with a meal. 11/28/21  Yes [provider]  levothyroxine (SYNTHROID) 137 MCG tablet Take 1 tablet (137 mcg total) by mouth daily. 07/25/20  Yes Fritzi Mandes, MD  loratadine (CLARITIN) 10 MG tablet Take 10 mg by mouth daily. 08/04/18  Yes [provider]  losartan (COZAAR) 25 MG tablet Take 25 mg by mouth daily. 12/02/18 12/10/21 Yes [provider]  metFORMIN (GLUCOPHAGE-XR) 500 MG 24 hr tablet Take 500 mg by mouth 2 (two) times daily. 09/26/21  Yes [provider]  metoprolol tartrate (LOPRESSOR) 25 MG tablet Take 1  tablet (25 mg total) by mouth 2 (two) times daily. 07/04/17  Yes Fritzi Mandes, MD  Multiple Vitamin (MULTIVITAMIN) capsule Take 1 capsule by mouth daily.   Yes [provider]  omeprazole (PRILOSEC) 20 MG capsule Take 20 mg by mouth 2 (two) times daily before a meal. 01/09/16  Yes [provider]  venlafaxine (EFFEXOR) 75 MG tablet Take 75 mg by mouth 2 (two) times daily with a meal.   Yes [provider]  Acetaminophen (TYLENOL PO) Take 500 mg by mouth.     [provider]  albuterol (VENTOLIN HFA) 108 (90 Base) MCG/ACT inhaler SMARTSIG:2 Inhalation Via Inhaler Every 4 Hours PRN 01/03/21   [provider]  fluticasone (FLONASE) 50 MCG/ACT nasal spray Place 2 sprays into  the nose daily.    [provider]  gabapentin (NEURONTIN) 100 MG capsule Take 100 mg by mouth 3 (three) times daily. 07/05/21   [provider]    Physical Exam: Vitals:   12/10/21 1330 12/10/21 1400 12/10/21 1405 12/10/21 1426  BP: (!) 81/55 122/72  (!) 83/66  Pulse: 77 83 84 78  Resp: 19 (!) 31 (!) 24 (!) 22  Temp:   98.2 F (36.8 C)   TempSrc:   Oral   SpO2: 92% 91%  91%  Weight:      Height:       Physical Exam Constitutional:      Appearance: She is well-developed.  HENT:     Head: Normocephalic and atraumatic.  Eyes:     Extraocular Movements: Extraocular movements intact.     Pupils: Pupils are equal, round, and reactive to light.  Cardiovascular:     Rate and Rhythm: Normal rate and regular rhythm.  Pulmonary:     Effort: Pulmonary effort is normal.     Breath sounds: Examination of the right-lower field reveals wheezing. Examination of the left-lower field reveals wheezing. Decreased breath sounds and wheezing present.  Abdominal:     General: Bowel sounds are normal.     Palpations: Abdomen is soft.  Musculoskeletal:        General: Normal range of motion.  Skin:    General: Skin is warm and dry.  Neurological:     General: No focal  deficit present.     Mental Status: She is alert and oriented to person, place, and time.  Psychiatric:        Mood and Affect: Mood normal.        Behavior: Behavior normal.      Assessment and Plan:  Christine Obrien is a 81 y.o. female with medical history significant of COPD, CHF chronic systolic, coronary artery disease, paroxysmal atrial fibrillation on chronic anticoagulation, diabetes without complication, depression, hypothyroidism comes to the emergency room with increasing shortness of breath for couple days along with cough which patient tells me is chronic. She denied any chest pain. She overall feels weak. She was not able to clearly tell me whether she had fever with chills.  Acute on chronic respiratory failure secondary to COPD exacerbation acute on chronic mild congestive heart failure systolic suspected bronchitis -- patient's blood pressure is soft will hold diuretics -- she received empiric antibiotic--- will continue Rocephin and Zithromax -- continue prednisone po  -- nebulizer/breathing treatment -- oxygen saturation more than 90%  Related hypotension -- hold BP meds -- will give gentle IV fluids  Hypokalemia -- IV KCl  Lactic acidosis -- patient is not meet sepsis criteria -- continue above treatment -- Trend lactate  Hypothyroidism -- continue Synthroid   Advance Care Planning:   Code Status: Full Code per pt  Consults: none  Family Communication: none todya  Severity of Illness: The appropriate patient status for this patient is INPATIENT. Inpatient status is judged to be reasonable and necessary in order to provide the required intensity of service to ensure the patient's safety. The patient's presenting symptoms, physical exam findings, and initial radiographic and laboratory data in the context of their chronic comorbidities is felt to place them at high risk for further clinical deterioration. Furthermore, it is not anticipated that the patient  will be medically stable for discharge from the hospital within 2 midnights of admission.   * I certify that at the point of admission it is  my clinical judgment that the patient will require inpatient hospital care spanning beyond 2 midnights from the point of admission due to high intensity of service, high risk for further deterioration and high frequency of surveillance required.*  Author: Fritzi Mandes, MD 12/10/2021 3:06 PM  For on call review [email or web address].

## 2021-12-11 DIAGNOSIS — J9601 Acute respiratory failure with hypoxia: Secondary | ICD-10-CM | POA: Diagnosis not present

## 2021-12-11 LAB — ECHOCARDIOGRAM COMPLETE
AR max vel: 1.21 cm2
AV Area VTI: 1.29 cm2
AV Area mean vel: 1.22 cm2
AV Mean grad: 7 mmHg
AV Peak grad: 12.8 mmHg
Ao pk vel: 1.79 m/s
Area-P 1/2: 3.42 cm2
Height: 63 in
S' Lateral: 2.62 cm
Weight: 2160 oz

## 2021-12-11 MED ORDER — IPRATROPIUM-ALBUTEROL 0.5-2.5 (3) MG/3ML IN SOLN
3.0000 mL | Freq: Three times a day (TID) | RESPIRATORY_TRACT | Status: DC
  Administered 2021-12-11 – 2021-12-12 (×3): 3 mL via RESPIRATORY_TRACT
  Filled 2021-12-11 (×3): qty 3

## 2021-12-11 MED ORDER — FUROSEMIDE 20 MG PO TABS: 20.0000 mg | ORAL_TABLET | Freq: Every day | ORAL | Status: DC

## 2021-12-11 MED ORDER — METOPROLOL TARTRATE 25 MG PO TABS: 12.5000 mg | ORAL_TABLET | Freq: Two times a day (BID) | ORAL | Status: DC

## 2021-12-11 MED ORDER — METHYLPREDNISOLONE SODIUM SUCC 125 MG IJ SOLR
60.0000 mg | INTRAMUSCULAR | Status: DC
  Administered 2021-12-11 – 2021-12-13 (×3): 60 mg via INTRAVENOUS
  Filled 2021-12-11 (×3): qty 2

## 2021-12-11 MED ORDER — POTASSIUM CHLORIDE 20 MEQ PO PACK
20.0000 meq | PACK | Freq: Two times a day (BID) | ORAL | Status: AC
Start: 2021-12-11 — End: 2021-12-11
  Administered 2021-12-11 (×2): 20 meq via ORAL
  Filled 2021-12-11 (×2): qty 1

## 2021-12-11 MED ORDER — FUROSEMIDE 10 MG/ML IJ SOLN
20.0000 mg | Freq: Once | INTRAMUSCULAR | Status: AC
  Administered 2021-12-11: 20 mg via INTRAVENOUS
  Filled 2021-12-11: qty 2

## 2021-12-11 NOTE — Evaluation (Signed)
Occupational Therapy Evaluation Patient Details Name: Christine Obrien MRN: [id number] DOB: 12/02/1940 Today's Date: 12/11/2021   History of Present Illness Pt admitted for ARF with complaints of SOB symptoms. HIstory includes COPD, CHF, CAD, Afib, and DM   Clinical Impression   Patient seen for OT evaluation. Patient presenting with decreased activity tolerance, impaired balance, and decreased strength. At baseline, pt was Mod I in ADLs, IADLs, and functional mobility with no AD. Pt is currently requiring set up A to supervision for ADLs and supervision for functional transfers/mobility with a RW. Pt is close to baseline level of function with ADLs, however, OT will continue to follow pt while in hospital to prevent further decline and work on implementing energy conservation techniques during ADLs. Pt lives with daughter who is available 24/7 to provide physical assistance as needed upon discharge. Patient will benefit from acute OT to increase overall independence in the areas of ADLs and functional mobility in order to safely discharge home.      Recommendations for follow up therapy are one component of a multi-disciplinary discharge planning process, led by the attending physician.  Recommendations may be updated based on patient status, additional functional criteria and insurance authorization.   Follow Up Recommendations  No OT follow up    Assistance Recommended at Discharge Set up Supervision/Assistance  Patient can return home with the following A little help with walking and/or transfers;Assist for transportation;Assistance with cooking/housework    Functional Status Assessment  Patient has had a recent decline in their functional status and demonstrates the ability to make significant improvements in function in a reasonable and predictable amount of time.  Equipment Recommendations  None recommended by OT    Recommendations for Other Services       Precautions / Restrictions  Precautions Precautions: Fall Restrictions Weight Bearing Restrictions: No      Mobility Bed Mobility               General bed mobility comments: received and left in recliner    Transfers Overall transfer level: Modified independent Equipment used: Rolling walker (2 wheels)                      Balance Overall balance assessment: Needs assistance Sitting-balance support: Feet supported Sitting balance-Leahy Scale: Good Sitting balance - Comments: good dynamic sitting balance   Standing balance support: Bilateral upper extremity supported Standing balance-Leahy Scale: Good Standing balance comment: use of RW during mobility                           ADL either performed or assessed with clinical judgement   ADL Overall ADL's : Needs assistance/impaired     Grooming: Wash/dry hands;Standing;Supervision/safety Grooming Details (indicate cue type and reason): Completed hand hygiene standing at sink in bathroom             Lower Body Dressing: Supervision/safety;Sitting/lateral leans Lower Body Dressing Details (indicate cue type and reason): Able to adjust socks using figure 4 position Toilet Transfer: Supervision/safety;Rolling walker (2 wheels);Regular Glass blower/designer Details (indicate cue type and reason): Pt completed toilet transfer with supervision + RW. Toileting- Clothing Manipulation and Hygiene: Supervision/safety;Sit to/from stand Toileting - Clothing Manipulation Details (indicate cue type and reason): Pt completed peri care in sitting with supervision, able to don/doff pants to during toileting with supervision in standing     Functional mobility during ADLs: Supervision/safety;Rolling walker (2 wheels) (to bathroom)  Vision Baseline Vision/History: 1 Wears glasses Patient Visual Report: No change from baseline       Perception     Praxis      Pertinent Vitals/Pain Pain Assessment Pain Assessment:  No/denies pain     Hand Dominance Right   Extremity/Trunk Assessment Upper Extremity Assessment Upper Extremity Assessment: Overall WFL for tasks assessed   Lower Extremity Assessment Lower Extremity Assessment: Overall WFL for tasks assessed       Communication Communication Communication: No difficulties   Cognition Arousal/Alertness: Awake/alert Behavior During Therapy: WFL for tasks assessed/performed Overall Cognitive Status: Within Functional Limits for tasks assessed                                 General Comments: Oriented to self/location/situation, grossly to time (said month was July)     General Comments       Exercises     Shoulder Instructions      Home Living Family/patient expects to be discharged to:: Private residence Living Arrangements: Children Available Help at Discharge: Family;Available 24 hours/day Type of Home: House Home Access: Stairs to enter [employer] of Steps: 1 step   Home Layout: One level     Bathroom Shower/Tub: Walk-in shower;Tub/shower unit   Bathroom Toilet: Handicapped height     Home Equipment: Conservation officer, nature (2 wheels);Shower seat;Grab bars - tub/shower;BSC/3in1;Rollator (4 wheels)          Prior Functioning/Environment Prior Level of Function : Independent/Modified Independent             Mobility Comments: reports no recent falls ADLs Comments: IND with all ADLs, not driving as much        OT Problem List: Decreased activity tolerance;Impaired balance (sitting and/or standing)      OT Treatment/Interventions: Self-care/ADL training;Therapeutic exercise;Patient/family education;Balance training;Energy conservation;Therapeutic activities    OT Goals(Current goals can be found in the care plan section) Acute Rehab OT Goals Patient Stated Goal: return home OT Goal Formulation: With patient Time For Goal Achievement: 12/25/21 Potential to Achieve Goals: Good  OT Frequency:  Min 2X/week    Co-evaluation              AM-PAC OT "6 Clicks" Daily Activity     Outcome Measure Help from another person eating meals?: None Help from another person taking care of personal grooming?: A Little Help from another person toileting, which includes using toliet, bedpan, or urinal?: A Little Help from another person bathing (including washing, rinsing, drying)?: A Little Help from another person to put on and taking off regular upper body clothing?: None Help from another person to put on and taking off regular lower body clothing?: A Little 6 Click Score: 20   End of Session Equipment Utilized During Treatment: Gait belt;Rolling walker (2 wheels) Nurse Communication: Mobility status  Activity Tolerance: Patient tolerated treatment well Patient left: in chair;with call bell/phone within reach;with chair alarm set  OT Visit Diagnosis: Unsteadiness on feet (R26.81);Muscle weakness (generalized) (M62.81)                Time: 5053-9767 OT Time Calculation (min): 18 min Charges:  OT General Charges $OT Visit: 1 Visit OT Evaluation $OT Eval Low Complexity: 1 Low  [facility] MS, OTR/L ascom [phone]  12/11/21, 5:09 PM

## 2021-12-11 NOTE — Consult Note (Addendum)
   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  Echocardiogram results pending on this admission.  She presented to the ED with complaints of worsening SOB for 2 days.  BNP 956.   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  Labs:  Sodium 145, potassium 3.1, chloride 110, CO2 27, BUN 10, creatinine 0.92 GFR greater than 60. Weight not documented Intake not documented Output not documented Blood pressure 114/76  Initial meeting with patient who was sitting up in the chair at bedside.  States that she had just returned from ambulating around the nurses station, felt that she did well. She felt that she is pretty independent at home.  Discussed how she takes care of her heart failure at home.  She states that she does not weigh herself on a daily basis and she is not even sure if there is a scale in her daughters house.  She goes on to state that she does not feel that she is ever been told that she needs to be weighing herself daily.  Explained  the reasoning behind daily weights and reporting a 2 to 3 pound weight gain overnight or 5 pounds within a week.  Also to make care providers aware with changes in symptoms such as increasing shortness of breath, PND, orthopnea or lower extremity edema.   She states as for diet she and her daughter have been eating a lot of frozen dinners.  She did not look at the sodium content but has been not paying attention to the carbs.  Explained  that some of those frozen dinners can contain up to 2000 mg per serving.  Also discussed fluid restriction, she states that she does not feel that she  drinks more than 1500 mL a day.  Also discussed follow-up in the outpatient heart failure clinic, she states that she will have to discuss this with her daughter as her daughter provides her transportation.  She had no further questions.  [name] RN [facility]

## 2021-12-11 NOTE — Progress Notes (Signed)
[facility] NAME: Christine Obrien    MR#:  [id number]  DATE OF BIRTH:  Jul 14, 1940  SUBJECTIVE:  patient complained of shortness of breath. No wheezing noted. She slept overall good. Sats dropped down to 89% on room air. Placed on oxygen. Has chronic smokers cough. No family at bedside.    VITALS:  Blood pressure 114/76, pulse 78, temperature 98.2 F (36.8 C), resp. rate 18, height '5\' 3"'$  (1.6 m), weight 61.2 kg, SpO2 93 %.  PHYSICAL EXAMINATION:   GENERAL:  81 y.o.-year-old patient lying in the bed with no acute distress.  LUNGS: decreased breath sounds bilaterally, no wheezing, rales, rhonchi.  CARDIOVASCULAR: S1, S2 normal. No murmurs, rubs, or gallops.  ABDOMEN: Soft, nontender, nondistended. Bowel sounds present.  EXTREMITIES: No  edema b/l.    NEUROLOGIC: nonfocal  patient is alert and awake SKIN: No obvious rash, lesion, or ulcer.   LABORATORY PANEL:  CBC Recent Labs  Lab 12/10/21 0917  WBC 10.3  HGB 11.8*  HCT 37.6  PLT 166    Chemistries  Recent Labs  Lab 12/10/21 0917  NA 145  K 3.1*  CL 110  CO2 27  GLUCOSE 146*  BUN 10  CREATININE 0.92  CALCIUM 9.2   Cardiac Enzymes No results for input(s): "TROPONINI" in the last 168 hours. RADIOLOGY:  DG Chest 2 View  Result Date: 12/10/2021 CLINICAL DATA:  Shortness of breath for 1 day. EXAM: CHEST - 2 VIEW COMPARISON:  07/21/2020 FINDINGS: UPPER limits normal heart size, CABG changes and LEFT-sided pacemaker/AICD again noted. Mild interstitial opacities and trace bilateral pleural effusions are noted. There is no evidence of pneumothorax or acute bony abnormality. IMPRESSION: Mild interstitial opacities and trace bilateral pleural, favor mild pulmonary edema. Electronically Signed   By: Margarette Canada M.D.   On: 12/10/2021 09:29    Assessment and Plan Christine Obrien is a 81 y.o. female with medical history significant of COPD, CHF chronic systolic, coronary artery disease,  paroxysmal atrial fibrillation on chronic anticoagulation, diabetes without complication, depression, hypothyroidism comes to the emergency room with increasing shortness of breath for couple days along with cough which patient tells me is chronic. She denied any chest pain. She overall feels weak. She was not able to clearly tell me whether she had fever with chills.   Acute on chronic respiratory failure secondary to COPD exacerbation acute on chronic mild congestive heart failure systolic suspected bronchitis/chronic cough smokers -- patient's blood pressure is soft will hold diuretics -- she received empiric antibiotic--- will continue Rocephin and Zithromax -- continue prednisone po  -- nebulizer/breathing treatment -- oxygen saturation more than 90%-- sats dropped down to upper 80s with exertion. Will place patient on oxygen. -- Give one dose of Lasix 20 mg times one and start IV Solu-Medrol -- Pro calcitonin<0.1   Related hypotension -- hold BP meds -- received gentle IV fluids--- DC it -- BP meds stable.   Hypokalemia -- IV KCl   Lactic acidosis -- patient is not meet sepsis criteria -- continue above treatment -- Trend lactate   Hypothyroidism -- continue Synthroid  PT OT to work with patient [facility] for discharge planning    Advance Care Planning:   Code Status: Full Code per pt   Consults: none   Family Communication: talk with daughter Edwinna Areola on the phone  DVT Prophylaxis : eliquis Level of care: Telemetry Cardiac Status is: Inpatient Remains inpatient appropriate because: sob, weakness Anticipate 1-2  days    TOTAL TIME TAKING CARE OF THIS PATIENT: 35 minutes.  >50% time spent on counselling and coordination of care  Note: This dictation was prepared with Dragon dictation along with smaller phrase technology. Any transcriptional errors that result from this process are unintentional.  Fritzi Mandes M.D    [facility]   CC: Primary care  physician; [name], MD

## 2021-12-11 NOTE — Evaluation (Signed)
Physical Therapy Evaluation Patient Details Name: Christine Obrien MRN: [id number] DOB: Dec 04, 1940 Today's Date: 12/11/2021  History of Present Illness  Pt admitted for ARF with complaints of SOB symptoms. HIstory includes COPD, CHF, CAD, Afib, and DM  Clinical Impression  Pt is a pleasant 81 year old female who was admitted for ARF with SOB symptoms. Pt performs bed mobility with mod I, transfers with mod I, and ambulation with supervision and RW. Pt demonstrates deficits with endurance/mobility. Would benefit from skilled PT to address above deficits and promote optimal return to PLOF. Recommend transition to [place] upon discharge from acute hospitalization. SaO2 on room air at rest = 82% SaO2 on room air while ambulating = n/a% SaO2 on 2 liters of O2 while ambulating = 93%        Recommendations for follow up therapy are one component of a multi-disciplinary discharge planning process, led by the attending physician.  Recommendations may be updated based on patient status, additional functional criteria and insurance authorization.  Follow Up Recommendations Home health PT      Assistance Recommended at Discharge Set up Supervision/Assistance  Patient can return home with the following  A little help with walking and/or transfers    Equipment Recommendations None recommended by PT  Recommendations for Other Services       Functional Status Assessment Patient has had a recent decline in their functional status and demonstrates the ability to make significant improvements in function in a reasonable and predictable amount of time.     Precautions / Restrictions Precautions Precautions: Fall Restrictions Weight Bearing Restrictions: No      Mobility  Bed Mobility Overal bed mobility: Modified Independent             General bed mobility comments: safe technique with upright posture    Transfers Overall transfer level: Modified independent Equipment used: Rolling walker  (2 wheels)               General transfer comment: no cues for sequencing. Safe technique    Ambulation/Gait Ambulation/Gait assistance: Supervision Gait Distance (Feet): 200 Feet Assistive device: Rolling walker (2 wheels) Gait Pattern/deviations: Step-through pattern       General Gait Details: ambulated with good speed and cadence. No LOB or SOB symptoms noted. All mobility performed on 2L of O2 with sats slightly decreasing to 88%, improving to 93% with exertion.  Stairs            Wheelchair Mobility    Modified Rankin (Stroke Patients Only)       Balance Overall balance assessment: Needs assistance, History of Falls Sitting-balance support: Feet supported Sitting balance-Leahy Scale: Good     Standing balance support: Bilateral upper extremity supported Standing balance-Leahy Scale: Good                               Pertinent Vitals/Pain Pain Assessment Pain Assessment: No/denies pain    Home Living Family/patient expects to be discharged to:: Private residence Living Arrangements: Children Available Help at Discharge: Family;Available 24 hours/day Type of Home: House Home Access: Stairs to enter   [employer] of Steps: 1 step   Home Layout: One level Home Equipment: Conservation officer, nature (2 wheels)      Prior Function Prior Level of Function : Independent/Modified Independent             Mobility Comments: reports no recent falls ADLs Comments: indep with all ADLs  Hand Dominance        Extremity/Trunk Assessment   Upper Extremity Assessment Upper Extremity Assessment: Overall WFL for tasks assessed    Lower Extremity Assessment Lower Extremity Assessment: Overall WFL for tasks assessed       Communication   Communication: No difficulties  Cognition Arousal/Alertness: Awake/alert Behavior During Therapy: WFL for tasks assessed/performed Overall Cognitive Status: Within Functional Limits for tasks  assessed                                          General Comments      Exercises     Assessment/Plan    PT Assessment Patient needs continued PT services  PT Problem List Decreased strength;Decreased activity tolerance;Decreased balance;Cardiopulmonary status limiting activity       PT Treatment Interventions Gait training;Therapeutic activities;Therapeutic exercise;Balance training    PT Goals (Current goals can be found in the Care Plan section)  Acute Rehab PT Goals Patient Stated Goal: to get stronger PT Goal Formulation: With patient Time For Goal Achievement: 12/25/21 Potential to Achieve Goals: Good    Frequency Min 2X/week     Co-evaluation               AM-PAC PT "6 Clicks" Mobility  Outcome Measure Help needed turning from your back to your side while in a flat bed without using bedrails?: None Help needed moving from lying on your back to sitting on the side of a flat bed without using bedrails?: None Help needed moving to and from a bed to a chair (including a wheelchair)?: A Little Help needed standing up from a chair using your arms (e.g., wheelchair or bedside chair)?: A Little Help needed to walk in hospital room?: A Little Help needed climbing 3-5 steps with a railing? : A Little 6 Click Score: 20    End of Session Equipment Utilized During Treatment: Gait belt;Oxygen Activity Tolerance: Patient tolerated treatment well Patient left: in chair;with chair alarm set Nurse Communication: Mobility status PT Visit Diagnosis: Muscle weakness (generalized) (M62.81);Difficulty in walking, not elsewhere classified (R26.2)    Time: 9892-1194 PT Time Calculation (min) (ACUTE ONLY): 27 min   Charges:   PT Evaluation $PT Eval Low Complexity: 1 Low PT Treatments $Gait Training: 8-22 mins        Greggory Stallion, PT, DPT, GCS [phone]   Keddrick Wyne 12/11/2021, 4:55 PM

## 2021-12-11 NOTE — Consult Note (Signed)
PHARMACY CONSULT NOTE - FOLLOW UP  Pharmacy Consult for Electrolyte Monitoring and Replacement   Recent Labs: Potassium (mmol/L)  Date Value  12/10/2021 3.1 (L)  04/01/2013 3.6   Magnesium (mg/dL)  Date Value  03/30/2013 2.0   Calcium (mg/dL)  Date Value  12/10/2021 9.2   Calcium, Total (mg/dL)  Date Value  04/01/2013 9.0   Albumin (g/dL)  Date Value  12/16/2020 3.9  03/30/2013 3.3 (L)   Sodium (mmol/L)  Date Value  12/10/2021 145  04/01/2013 142   Assessment: Patient admitted with SOB, HF exacerbation, hypotension, and suspected bronchitis. Pharmacy consulted for electrolytes replacement  Noted IVF NS with 49mq KCL given from 8/22 '@1715'$  to 8/23'@0125'$ .   Goal of Therapy:  Electrolytes WNL  Plan:  K below goal after receiving IVF with 449m of Kcl and furosemide x 1 dose. Will replace with oral Kcl '20mg'$  x 2 doses today. Repeat BMP with AM labs and replace electrolytes as needed.  Christine Obrien PharmD, BCPS 12/11/2021 2:22 PM

## 2021-12-12 ENCOUNTER — Inpatient Hospital Stay: Payer: Medicare Other

## 2021-12-12 DIAGNOSIS — J9601 Acute respiratory failure with hypoxia: Secondary | ICD-10-CM | POA: Diagnosis not present

## 2021-12-12 LAB — CBC
HCT: 34.1 % — ABNORMAL LOW (ref 36.0–46.0)
Hemoglobin: 10.9 g/dL — ABNORMAL LOW (ref 12.0–15.0)
MCH: 31 pg (ref 26.0–34.0)
MCHC: 32 g/dL (ref 30.0–36.0)
MCV: 96.9 fL (ref 80.0–100.0)
Platelets: 155 10*3/uL (ref 150–400)
RBC: 3.52 MIL/uL — ABNORMAL LOW (ref 3.87–5.11)
RDW: 14.4 % (ref 11.5–15.5)
WBC: 10.6 10*3/uL — ABNORMAL HIGH (ref 4.0–10.5)
nRBC: 0 % (ref 0.0–0.2)

## 2021-12-12 LAB — BASIC METABOLIC PANEL
Anion gap: 8 (ref 5–15)
BUN: 22 mg/dL (ref 8–23)
CO2: 28 mmol/L (ref 22–32)
Calcium: 9.7 mg/dL (ref 8.9–10.3)
Chloride: 108 mmol/L (ref 98–111)
Creatinine, Ser: 1.05 mg/dL — ABNORMAL HIGH (ref 0.44–1.00)
GFR, Estimated: 54 mL/min — ABNORMAL LOW (ref >60)
Glucose, Bld: 183 mg/dL — ABNORMAL HIGH (ref 70–99)
Potassium: 3.9 mmol/L (ref 3.5–5.1)
Sodium: 144 mmol/L (ref 135–145)

## 2021-12-12 LAB — PHOSPHORUS: Phosphorus: 2.6 mg/dL (ref 2.5–4.6)

## 2021-12-12 LAB — MAGNESIUM: Magnesium: 2.2 mg/dL (ref 1.7–2.4)

## 2021-12-12 MED ORDER — METHYLPREDNISOLONE SODIUM SUCC 125 MG IJ SOLR
60.0000 mg | Freq: Once | INTRAMUSCULAR | Status: AC
  Administered 2021-12-12: 60 mg via INTRAVENOUS
  Filled 2021-12-12: qty 2

## 2021-12-12 MED ORDER — FUROSEMIDE 10 MG/ML IJ SOLN
20.0000 mg | Freq: Once | INTRAMUSCULAR | Status: AC
  Administered 2021-12-12: 20 mg via INTRAVENOUS
  Filled 2021-12-12: qty 2

## 2021-12-12 MED ORDER — FUROSEMIDE 10 MG/ML IJ SOLN
20.0000 mg | Freq: Two times a day (BID) | INTRAMUSCULAR | Status: AC
Start: 2021-12-12 — End: 2021-12-13
  Administered 2021-12-12 – 2021-12-13 (×2): 20 mg via INTRAVENOUS
  Filled 2021-12-12 (×2): qty 2

## 2021-12-12 MED ORDER — POTASSIUM CHLORIDE 20 MEQ PO PACK
40.0000 meq | PACK | Freq: Once | ORAL | Status: AC
  Administered 2021-12-12: 40 meq via ORAL
  Filled 2021-12-12: qty 2

## 2021-12-12 MED ORDER — METOPROLOL TARTRATE 25 MG PO TABS
12.5000 mg | ORAL_TABLET | Freq: Two times a day (BID) | ORAL | Status: DC
  Administered 2021-12-12 – 2021-12-13 (×3): 12.5 mg via ORAL
  Filled 2021-12-12 (×3): qty 1

## 2021-12-12 MED ORDER — LORAZEPAM 2 MG/ML IJ SOLN
0.5000 mg | Freq: Once | INTRAMUSCULAR | Status: AC
  Administered 2021-12-12: 0.5 mg via INTRAVENOUS
  Filled 2021-12-12: qty 1

## 2021-12-12 MED ORDER — STERILE WATER FOR INJECTION IJ SOLN
INTRAMUSCULAR | Status: AC
  Administered 2021-12-12: 10 mL
  Filled 2021-12-12: qty 10

## 2021-12-12 MED ORDER — EMPAGLIFLOZIN 10 MG PO TABS
10.0000 mg | ORAL_TABLET | Freq: Every day | ORAL | Status: DC
  Administered 2021-12-12 – 2021-12-13 (×2): 10 mg via ORAL
  Filled 2021-12-12 (×2): qty 1

## 2021-12-12 MED ORDER — IPRATROPIUM-ALBUTEROL 0.5-2.5 (3) MG/3ML IN SOLN
3.0000 mL | Freq: Two times a day (BID) | RESPIRATORY_TRACT | Status: DC
  Administered 2021-12-12 – 2021-12-13 (×2): 3 mL via RESPIRATORY_TRACT
  Filled 2021-12-12 (×3): qty 3

## 2021-12-12 NOTE — Consult Note (Signed)
[place] NOTE       Patient ID: Christine Obrien MRN: [id number] DOB/AGE: 05/19/1940 81 y.o.  Admit date: 12/10/2021 Referring Physician Dr. Fritzi Mandes  Primary Physician Dr. 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  She is being treated for a COPD exacerbation with associated bronchitis vs pneumonia vs pulmonary edema.  Cardiology is consulted on hospital day 2 for assistance with her heart failure and question of VT noted on telemetry.  The patient presents with her daughter who contributes to the history.  She notes some worsening shortness of breath 2 days prior to presentation, and noted her oxygen was low.  She notes that albuterol helped her shortness of breath, and per EMS Solu-Medrol and albuterol significantly improved her symptoms, and overall felt similar to her prior COPD exacerbations. She does not use oxygen at baseline, and has a chronic cough that is unchanged she was admitted 2 days ago and started on antibiotics for CAP coverage in addition to IV fluids at 50 MLS per hour that ran for 18 hours, due to hypotension, and her home metoprolol was held.  She was feeling better the day following admission and had been weaned off supplemental oxygen entirely.  She woke up gasping for air, was tachypneic with increased work of breathing and accessory muscle use and a rapid response was called.  Chest x-ray showed worsening central venous congestion, she was given 20 mg of Lasix and IV Solu-Medrol, albuterol and placed on BiPAP with improvement.  On telemetry she was noted to have a  30-minute periods of tachycardia this morning with heart rate between 140 and 160, which upon review with Dr. Saralyn Pilar appears to either be SVT or atrial fibrillation with her known LBBB.  Pacemaker interrogation confirms the rhythm as either atrial tachycardia or atrial fibrillation during the episode this morning.  At my time of evaluation she is sitting upright in bed eating a sandwich for lunch with her daughter at bedside.  She feels better this afternoon, denies any chest pain, palpitations or heart racing.  No peripheral edema.  Vitals are notable for blood pressure of 116/74, heart rate 71 in sinus rhythm on telemetry, SPO2 99% on 3 L by nasal cannula.  Labs are notable for potassium 3.9, BUN/creatinine 22/1.05, GFR 54, BNP elevated at 985, high-sensitivity troponin flat at 24-25.  Procalcitonin negative.  Trace leukocytosis with WBCs 10.6, H&H 10.9/34.1 platelets 155.  Chest x-ray with worsening central venous congestion from day of admission to this morning.  Review of systems complete and found to be negative unless listed above     Past Medical History:  Diagnosis Date   AICD (automatic cardioverter/defibrillator) present    Anxiety    Asthma    Atherosclerotic peripheral vascular disease with intermittent claudication (HCC)    Cancer (HCC)    cervical   Carotid artery occlusion    CHF (congestive heart failure), NYHA class III (HCC)    CKD (chronic kidney disease), stage III (HCC)    COPD (chronic obstructive pulmonary disease) (HCC)    Coronary artery disease    Current use  of long term anticoagulation    Depression    Diabetes mellitus without complication (HCC)    GERD (gastroesophageal reflux disease)    Gout    HLD (hyperlipidemia)    Hx of CABG    Hypertension    Hypothyroidism    Kidney stones    LBBB (left bundle branch block)    Myocardial infarction ([place])    Presence of permanent cardiac pacemaker    Subclavian steal syndrome     Past Surgical History:   Procedure Laterality Date   ABDOMINAL HYSTERECTOMY     partial   CHOLECYSTECTOMY     COLONOSCOPY WITH PROPOFOL N/A 01/11/2020   Procedure: COLONOSCOPY WITH PROPOFOL;  Surgeon: Toledo, Benay Pike, MD;  Location: [facility] ENDOSCOPY;  Service: Gastroenterology;  Laterality: N/A;   CORONARY ANGIOPLASTY     CORONARY ARTERY BYPASS GRAFT     double   ENDARTERECTOMY Right 05/08/2021   Procedure: ENDARTERECTOMY CAROTID;  Surgeon: Algernon Huxley, MD;  Location: [facility] ORS;  Service: Vascular;  Laterality: Right;   EYE SURGERY Bilateral 08/2009   cataracts   HIATAL HERNIA REPAIR  1980s   LEFT HEART CATH AND CORONARY ANGIOGRAPHY N/A 07/03/2017   Procedure: LEFT HEART CATH AND CORONARY ANGIOGRAPHY;  Surgeon: Corey Skains, MD;  Location: [place] CV LAB;  Service: Cardiovascular;  Laterality: N/A;   LOWER EXTREMITY ANGIOGRAPHY Left 01/11/2018   Procedure: LOWER EXTREMITY ANGIOGRAPHY;  Surgeon: Algernon Huxley, MD;  Location: [employer] CV LAB;  Service: Cardiovascular;  Laterality: Left;   THYROID SURGERY     pt does not remember if removed or partial removal    Medications Prior to Admission  Medication Sig Dispense Refill Last Dose   apixaban (ELIQUIS) 5 MG TABS tablet Take 5 mg by mouth 2 (two) times daily.   12/09/2021 at 2000   aspirin EC 81 MG tablet Take 1 tablet (81 mg total) by mouth daily. Swallow whole. 30 tablet 11 12/09/2021   buPROPion ER (WELLBUTRIN SR) 100 MG 12 hr tablet Take 100 mg by mouth 2 (two) times daily.   12/09/2021   empagliflozin (JARDIANCE) 10 MG TABS tablet Take 10 mg by mouth daily.   12/09/2021   ezetimibe (ZETIA) 10 MG tablet Take 10 mg by mouth daily.   12/09/2021   ferrous sulfate 325 (65 FE) MG tablet Take 325 mg by mouth daily with breakfast.  0 12/09/2021   fluticasone-salmeterol (ADVAIR) 250-50 MCG/ACT AEPB Inhale 1 puff into the lungs in the morning and at bedtime.   12/10/2021   furosemide (LASIX) 20 MG tablet Take 1 tablet (20 mg total) by mouth daily. 15 tablet 0  12/09/2021   glipiZIDE (GLUCOTROL) 5 MG tablet Take 5 mg by mouth 2 (two) times daily before a meal.   12/09/2021   icosapent Ethyl (VASCEPA) 1 g capsule Take 1 g by mouth 2 (two) times daily with a meal.   12/09/2021   levothyroxine (SYNTHROID) 137 MCG tablet Take 1 tablet (137 mcg total) by mouth daily. 30 tablet 3 12/09/2021   loratadine (CLARITIN) 10 MG tablet Take 10 mg by mouth daily.   12/09/2021 at prn   losartan (COZAAR) 25 MG tablet Take 25 mg by mouth daily.   12/09/2021   metFORMIN (GLUCOPHAGE-XR) 500 MG 24 hr tablet Take 500 mg by mouth 2 (two) times daily.   12/09/2021   metoprolol tartrate (LOPRESSOR) 25 MG tablet Take 1 tablet (25 mg total) by mouth 2 (two) times daily. 60 tablet 0 12/09/2021  Multiple Vitamin (MULTIVITAMIN) capsule Take 1 capsule by mouth daily.   12/09/2021   omeprazole (PRILOSEC) 20 MG capsule Take 20 mg by mouth 2 (two) times daily before a meal.   12/09/2021   venlafaxine (EFFEXOR) 75 MG tablet Take 75 mg by mouth 2 (two) times daily with a meal.   12/09/2021   Acetaminophen (TYLENOL PO) Take 500 mg by mouth.    prn at prn   albuterol (VENTOLIN HFA) 108 (90 Base) MCG/ACT inhaler SMARTSIG:2 Inhalation Via Inhaler Every 4 Hours PRN   prn at prn   fluticasone (FLONASE) 50 MCG/ACT nasal spray Place 2 sprays into the nose daily.   prn at prn   gabapentin (NEURONTIN) 100 MG capsule Take 100 mg by mouth 3 (three) times daily.   prn at prn   Social History   Socioeconomic History   Marital status: Widowed    Spouse name: Not on file   Number of children: Not on file   Years of education: Not on file   Highest education level: Not on file  Occupational History   Not on file  Tobacco Use   Smoking status: Every Day    Packs/day: 1.00    Types: Cigarettes   Smokeless tobacco: Never  Vaping Use   Vaping Use: Never used  Substance and Sexual Activity   Alcohol use: No   Drug use: No   Sexual activity: Not on file  Other Topics Concern   Not on file  Social  History Narrative   Not on file   Social Determinants of Health   Financial Resource Strain: Not on file  Food Insecurity: Not on file  Transportation Needs: Not on file  Physical Activity: Not on file  Stress: Not on file  Social Connections: Not on file  Intimate Partner Violence: Not on file    Family History  Problem Relation Age of Onset   Hypertension Mother    Hypertension Father    Diabetes Sister    Cancer Brother    Diabetes Brother    Breast cancer Neg Hx       PHYSICAL EXAM General: Pleasant elderly Caucasian female, conversational and smiling, sitting upright with legs off hospital bed eating lunch.  Daughter at bedside.   HEENT:  Normocephalic and atraumatic. Neck:  No JVD.  Lungs: Normal respiratory effort on oxygen by nasal cannula.  Decreased breath sounds bilaterally with trace crackles.  No appreciable wheezes Heart: HRRR . Normal S1 and S2 without gallops or murmurs.  Abdomen: Non-distended appearing.  Msk: Normal strength and tone for age. Extremities: Warm and well perfused. No clubbing, cyanosis.  No peripheral edema.  Neuro: Alert and oriented X 3. Psych:  Answers questions appropriately.   Labs: Basic Metabolic Panel: Recent Labs    12/10/21 0917 12/12/21 0522  NA 145 144  K 3.1* 3.9  CL 110 108  CO2 27 28  GLUCOSE 146* 183*  BUN 10 22  CREATININE 0.92 1.05*  CALCIUM 9.2 9.7   Liver Function Tests: No results for input(s): "AST", "ALT", "ALKPHOS", "BILITOT", "PROT", "ALBUMIN" in the last 72 hours. No results for input(s): "LIPASE", "AMYLASE" in the last 72 hours. CBC: Recent Labs    12/10/21 0917  WBC 10.3  NEUTROABS 8.1*  HGB 11.8*  HCT 37.6  MCV 96.4  PLT 166   Cardiac Enzymes: Recent Labs    12/10/21 0917 12/10/21 1151  TROPONINIHS 24* 25*   BNP: Invalid input(s): "POCBNP" D-Dimer: No results for input(s): "DDIMER" in the  last 72 hours. Hemoglobin A1C: No results for input(s): "HGBA1C" in the last 72  hours. Fasting Lipid Panel: No results for input(s): "CHOL", "HDL", "LDLCALC", "TRIG", "CHOLHDL", "LDLDIRECT" in the last 72 hours. Thyroid Function Tests: No results for input(s): "TSH", "T4TOTAL", "T3FREE", "THYROIDAB" in the last 72 hours.  Invalid input(s): "FREET3" Anemia Panel: No results for input(s): "VITAMINB12", "FOLATE", "FERRITIN", "TIBC", "IRON", "RETICCTPCT" in the last 72 hours.  DG Chest Port 1 View  Result Date: 12/12/2021 CLINICAL DATA:  Dyspnea EXAM: PORTABLE CHEST 1 VIEW COMPARISON:  12/10/2021 FINDINGS: The lungs are hyperinflated in keeping with changes of underlying COPD, stable since prior examination. Superimposed peripheral bibasilar interstitial opacities are present most in keeping with mild interstitial pulmonary edema. This appears stable since prior examination. No pneumothorax or pleural effusion. Coronary artery bypass grafting has been performed. Cardiac size is within normal limits. Left subclavian pacemaker defibrillator is unchanged. No acute bone abnormality. IMPRESSION: 1. Stable mild interstitial pulmonary edema. 2. COPD. Electronically Signed   By: Fidela Salisbury M.D.   On: 12/12/2021 02:23   ECHOCARDIOGRAM COMPLETE  Result Date: 12/11/2021    ECHOCARDIOGRAM REPORT   Patient Name:   Christine Obrien Date of Exam: 12/10/2021 Medical Rec #:  [id number]   Height: Accession #:    [id number]  Weight: Date of Birth:  March 27, 1941   BSA: Patient Age:    77 years    BP:           83/61 mmHg Patient Gender: F           HR:           79 bpm. Exam Location:  [facility] Procedure: 2D Echo, Cardiac Doppler and Color Doppler Indications:     Acute respiratory distress R06.03  History:         Patient has no prior history of Echocardiogram examinations.                  CHF, CAD and Previous Myocardial Infarction, Prior CABG,                  Pacemaker and Endarterectomy, COPD, Arrythmias:LBBB; Risk                  Factors:Current Smoker, Diabetes, Hypertension and                   Dyslipidemia.  Sonographer:     Rosalia Hammers Referring Phys:  [place] Diagnosing Phys: Yolonda Kida MD  Sonographer Comments: Image quality was good. IMPRESSIONS  1. Left ventricular ejection fraction, by estimation, is 70 to 75%. The left ventricle has hyperdynamic function. The left ventricle has no regional wall motion abnormalities. There is mild left ventricular hypertrophy. Left ventricular diastolic parameters are consistent with Grade II diastolic dysfunction (pseudonormalization).  2. Right ventricular systolic function is normal. The right ventricular size is normal.  3. The mitral valve is normal in structure. Trivial mitral valve regurgitation.  4. The aortic valve is normal in structure. Aortic valve regurgitation is trivial. FINDINGS  Left Ventricle: Left ventricular ejection fraction, by estimation, is 70 to 75%. The left ventricle has hyperdynamic function. The left ventricle has no regional wall motion abnormalities. The left ventricular internal cavity size was normal in size. There is mild left ventricular hypertrophy. Left ventricular diastolic parameters are consistent with Grade II diastolic dysfunction (pseudonormalization). Right Ventricle: The right ventricular size is normal. No increase in right ventricular wall thickness. Right ventricular systolic function is normal.  Left Atrium: Left atrial size was normal in size. Right Atrium: Right atrial size was normal in size. Pericardium: There is no evidence of pericardial effusion. Mitral Valve: The mitral valve is normal in structure. Trivial mitral valve regurgitation. Tricuspid Valve: The tricuspid valve is normal in structure. Tricuspid valve regurgitation is mild. Aortic Valve: The aortic valve is normal in structure. Aortic valve regurgitation is trivial. Aortic valve mean gradient measures 7.0 mmHg. Aortic valve peak gradient measures 12.8 mmHg. Aortic valve area, by VTI measures 1.29 cm. Pulmonic Valve: The pulmonic valve  was normal in structure. Pulmonic valve regurgitation is not visualized. Aorta: The ascending aorta was not well visualized. IAS/Shunts: No atrial level shunt detected by color flow Doppler.  LEFT VENTRICLE PLAX 2D LVIDd:         4.35 cm   Diastology LVIDs:         2.62 cm   LV e' medial:    4.35 cm/s LV PW:         1.40 cm   LV E/e' medial:  37.7 LV IVS:        1.28 cm   LV e' lateral:   4.90 cm/s LVOT diam:     1.60 cm   LV E/e' lateral: 33.5 LV SV:         49 LVOT Area:     2.01 cm  RIGHT VENTRICLE RV Basal diam:  2.82 cm RV Mid diam:    3.12 cm RV S prime:     13.20 cm/s TAPSE (M-mode): 2.3 cm LEFT ATRIUM             RIGHT ATRIUM LA diam:        3.50 cm RA Area:     16.00 cm LA Vol (A2C):   43.5 ml RA Volume:   41.50 ml LA Vol (A4C):   38.7 ml LA Biplane Vol: 41.8 ml  AORTIC VALVE AV Area (Vmax):    1.21 cm AV Area (Vmean):   1.22 cm AV Area (VTI):     1.29 cm AV Vmax:           179.00 cm/s AV Vmean:          129.000 cm/s AV VTI:            0.381 m AV Peak Grad:      12.8 mmHg AV Mean Grad:      7.0 mmHg LVOT Vmax:         108.00 cm/s LVOT Vmean:        78.400 cm/s LVOT VTI:          0.244 m LVOT/AV VTI ratio: 0.64  AORTA Ao Root diam: 2.70 cm MITRAL VALVE                TRICUSPID VALVE MV Area (PHT): 3.42 cm     TR Peak grad:   49.0 mmHg MV Decel Time: 222 msec     TR Vmax:        350.00 cm/s MV E velocity: 164.00 cm/s MV A velocity: 160.00 cm/s  SHUNTS MV E/A ratio:  1.02         Systemic VTI:  0.24 m                             Systemic Diam: 1.60 cm Dwayne Prince [place] MD Electronically signed by Yolonda Kida MD Signature Date/Time: 12/11/2021/5:30:23 PM    Final  Radiology: [facility] Chest Port 1 View  Result Date: 12/12/2021 CLINICAL DATA:  Dyspnea EXAM: PORTABLE CHEST 1 VIEW COMPARISON:  12/10/2021 FINDINGS: The lungs are hyperinflated in keeping with changes of underlying COPD, stable since prior examination. Superimposed peripheral bibasilar interstitial opacities are present most in keeping  with mild interstitial pulmonary edema. This appears stable since prior examination. No pneumothorax or pleural effusion. Coronary artery bypass grafting has been performed. Cardiac size is within normal limits. Left subclavian pacemaker defibrillator is unchanged. No acute bone abnormality. IMPRESSION: 1. Stable mild interstitial pulmonary edema. 2. COPD. Electronically Signed   By: Fidela Salisbury M.D.   On: 12/12/2021 02:23   ECHOCARDIOGRAM COMPLETE  Result Date: 12/11/2021    ECHOCARDIOGRAM REPORT   Patient Name:   Christine Obrien Date of Exam: 12/10/2021 Medical Rec #:  [id number]   Height: Accession #:    [id number]  Weight: Date of Birth:  [birth year]   BSA: Patient Age:    89 years    BP:           83/61 mmHg Patient Gender: F           HR:           79 bpm. Exam Location:  [facility] Procedure: 2D Echo, Cardiac Doppler and Color Doppler Indications:     Acute respiratory distress R06.03  History:         Patient has no prior history of Echocardiogram examinations.                  CHF, CAD and Previous Myocardial Infarction, Prior CABG,                  Pacemaker and Endarterectomy, COPD, Arrythmias:LBBB; Risk                  Factors:Current Smoker, Diabetes, Hypertension and                  Dyslipidemia.  Sonographer:     Rosalia Hammers Referring Phys:  [place] Diagnosing Phys: Yolonda Kida MD  Sonographer Comments: Image quality was good. IMPRESSIONS  1. Left ventricular ejection fraction, by estimation, is 70 to 75%. The left ventricle has hyperdynamic function. The left ventricle has no regional wall motion abnormalities. There is mild left ventricular hypertrophy. Left ventricular diastolic parameters are consistent with Grade II diastolic dysfunction (pseudonormalization).  2. Right ventricular systolic function is normal. The right ventricular size is normal.  3. The mitral valve is normal in structure. Trivial mitral valve regurgitation.  4. The aortic valve is normal in structure. Aortic valve  regurgitation is trivial. FINDINGS  Left Ventricle: Left ventricular ejection fraction, by estimation, is 70 to 75%. The left ventricle has hyperdynamic function. The left ventricle has no regional wall motion abnormalities. The left ventricular internal cavity size was normal in size. There is mild left ventricular hypertrophy. Left ventricular diastolic parameters are consistent with Grade II diastolic dysfunction (pseudonormalization). Right Ventricle: The right ventricular size is normal. No increase in right ventricular wall thickness. Right ventricular systolic function is normal. Left Atrium: Left atrial size was normal in size. Right Atrium: Right atrial size was normal in size. Pericardium: There is no evidence of pericardial effusion. Mitral Valve: The mitral valve is normal in structure. Trivial mitral valve regurgitation. Tricuspid Valve: The tricuspid valve is normal in structure. Tricuspid valve regurgitation is mild. Aortic Valve: The aortic valve is normal in structure. Aortic valve regurgitation is trivial. Aortic valve mean  gradient measures 7.0 mmHg. Aortic valve peak gradient measures 12.8 mmHg. Aortic valve area, by VTI measures 1.29 cm. Pulmonic Valve: The pulmonic valve was normal in structure. Pulmonic valve regurgitation is not visualized. Aorta: The ascending aorta was not well visualized. IAS/Shunts: No atrial level shunt detected by color flow Doppler.  LEFT VENTRICLE PLAX 2D LVIDd:         4.35 cm   Diastology LVIDs:         2.62 cm   LV e' medial:    4.35 cm/s LV PW:         1.40 cm   LV E/e' medial:  37.7 LV IVS:        1.28 cm   LV e' lateral:   4.90 cm/s LVOT diam:     1.60 cm   LV E/e' lateral: 33.5 LV SV:         49 LVOT Area:     2.01 cm  RIGHT VENTRICLE RV Basal diam:  2.82 cm RV Mid diam:    3.12 cm RV S prime:     13.20 cm/s TAPSE (M-mode): 2.3 cm LEFT ATRIUM             RIGHT ATRIUM LA diam:        3.50 cm RA Area:     16.00 cm LA Vol (A2C):   43.5 ml RA Volume:   41.50 ml  LA Vol (A4C):   38.7 ml LA Biplane Vol: 41.8 ml  AORTIC VALVE AV Area (Vmax):    1.21 cm AV Area (Vmean):   1.22 cm AV Area (VTI):     1.29 cm AV Vmax:           179.00 cm/s AV Vmean:          129.000 cm/s AV VTI:            0.381 m AV Peak Grad:      12.8 mmHg AV Mean Grad:      7.0 mmHg LVOT Vmax:         108.00 cm/s LVOT Vmean:        78.400 cm/s LVOT VTI:          0.244 m LVOT/AV VTI ratio: 0.64  AORTA Ao Root diam: 2.70 cm MITRAL VALVE                TRICUSPID VALVE MV Area (PHT): 3.42 cm     TR Peak grad:   49.0 mmHg MV Decel Time: 222 msec     TR Vmax:        350.00 cm/s MV E velocity: 164.00 cm/s MV A velocity: 160.00 cm/s  SHUNTS MV E/A ratio:  1.02         Systemic VTI:  0.24 m                             Systemic Diam: 1.60 cm Yolonda Kida MD Electronically signed by Yolonda Kida MD Signature Date/Time: 12/11/2021/5:30:23 PM    Final    DG Chest 2 View  Result Date: 12/10/2021 CLINICAL DATA:  Shortness of breath for 1 day. EXAM: CHEST - 2 VIEW COMPARISON:  07/21/2020 FINDINGS: UPPER limits normal heart size, CABG changes and LEFT-sided pacemaker/AICD again noted. Mild interstitial opacities and trace bilateral pleural effusions are noted. There is no evidence of pneumothorax or acute bony abnormality. IMPRESSION: Mild interstitial opacities and trace bilateral pleural, favor mild pulmonary edema.  Electronically Signed   By: Margarette Canada M.D.   On: 12/10/2021 09:29    ECHO LVEF 70 to 75%, G2 DD 11/2021  TELEMETRY reviewed by me (LT) 12/12/2021 : Initially sinus rhythm with known LBBB rate in the 70s to 80s, 30-minute period of tachycardia C/W SVT versus atrial fibrillation with heart rate in the 140s to 160s, pacemaker interrogation confirms rhythm is either AT/AF.  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  She is being treated for a COPD exacerbation with associated bronchitis vs pneumonia vs pulmonary edema.  Cardiology is consulted on hospital day 2 for assistance with her heart failure and question of VT noted on telemetry.  #Acute on chronic respiratory failure 2/2 COPD exacerbation #Acute on chronic HF with recovered EF (prev 35%, now 70% with g2dd) s/p CRT-D  The patient presented to [facility] ED 12/10/2021 with shortness of breath and hypoxia, and a cough that was slightly worsening than usual, felt like her usual COPD exacerbations.  She received 18 hours of continuous IV fluids and had an episode of what sounds like PND the early morning of 8/24 for which a rapid response was called.  She improved with BiPAP, IV Lasix, albuterol, and Solu-Medrol.  She is on supplemental oxygen this afternoon, although feels better overall.  Her BNP was elevated on admission to 900, chest x-ray today shows interval worsening of her central venous congestion.  Although she has not overtly volume overloaded appearing on exam. -Agree with current therapy per primary team for treatment of her COPD exacerbation -S/p IV Lasix 20 mg x 3 doses with clinical improvement, although remains net positive almost 1 L. -We will add another 20 mg of Lasix this evening, and tomorrow morning with close monitoring of her renal function. -Continue GDMT with metoprolol tartrate 12.5 mg twice daily, can increase dose as her blood pressure allows, Jardiance 10 mg once daily  (intolerant of ACE inhibitors) -Strict I's/O  #Paroxysmal atrial fibrillation #Chronic LBBB -History  of paroxysmal atrial fibrillation, episode on telemetry tod confirmed with device interrogation to be AF/atrial tachycardia and not VT (no change in QRS axis) -Continue metoprolol tartrate 12.5 mg twice daily for rate control, can uptitrate if her blood pressure allows -Continue Eliquis 5 mg twice daily for stroke risk reduction CHA2DS2-VASc 7 . (of note, she is 81yo and her weight is 61.6 kg, recommend monitoring of her weight in the future to consider dose reduction)  #CAD s/p CABG x2 06/2015 Denies chest pain, troponins minimally elevated with a flat trend, inconsistent with ACS. -Continue aspirin, Zetia 10 mg once daily (intolerant of statins)  This patient's plan of care was discussed and created with Dr. Saralyn Pilar and he is in agreement.  Signed: Tristan Schroeder , PA-C 12/12/2021, 10:47 AM [facility]

## 2021-12-12 NOTE — Significant Event (Signed)
Rapid Response Event Note   Reason for Call :  Resp Distress  Initial Focused Assessment:  Rapid nurse, respiratory, and provider NP at bedside when rapid called. Patient with increase work of breathing. Patient receiving a breathing treatment during assessment. Vitals BP 159/119 HR 119 RR 36 02 100%.  Patient alert and oriented able to make conversation but appears anxious. Patient placed on bipap at 40%.  Vitals after bipap placed BP 117/77 HR 88 RR 22 O2 100%. Breathing improved after patient was placed on BiPAP    Interventions:  -EKG -Bipap -Steroids -Chest xray -Possible Lasixs depending on results of chest xray.  Plan of Care:  Maintain Airway and adequate ventilations.      MD Notified: Neomia Glass NP Call [place]  Gonzella Lex, RN

## 2021-12-12 NOTE — Consult Note (Addendum)
PHARMACY CONSULT NOTE - FOLLOW UP  Pharmacy Consult for Electrolyte Monitoring and Replacement   Recent Labs: Potassium (mmol/L)  Date Value  12/12/2021 3.9  04/01/2013 3.6   Magnesium (mg/dL)  Date Value  12/12/2021 2.2  03/30/2013 2.0   Calcium (mg/dL)  Date Value  12/12/2021 9.7   Calcium, Total (mg/dL)  Date Value  04/01/2013 9.0   Albumin (g/dL)  Date Value  12/16/2020 3.9  03/30/2013 3.3 (L)   Phosphorus (mg/dL)  Date Value  12/12/2021 2.6   Sodium (mmol/L)  Date Value  12/12/2021 144  04/01/2013 142   Assessment: Patient admitted with SOB, HF exacerbation, hypotension, and suspected bronchitis. Pharmacy consulted for electrolytes replacement  Noted IVF NS with 63mq KCL given from 8/22 '@1715'$  to 8/23'@0125'$ .   Goal of Therapy:  Electrolytes WNL  Plan:  No replacement needed this morning. Will continue to follow and replace electrolytes per protocol.  Ryland Tungate Rodriguez-Guzman PharmD, BCPS 12/12/2021 1:42 PM

## 2021-12-12 NOTE — Progress Notes (Signed)
   12/12/21 0134  Assess: MEWS Score  Temp 98.1 F (36.7 C)  BP (!) 156/119  MAP (mmHg) 131  Pulse Rate (!) 119  Resp (!) 36  SpO2 100 %  O2 Device Aerosol Mask  Assess: MEWS Score  MEWS Temp 0  MEWS Systolic 0  MEWS Pulse 2  MEWS RR 3  MEWS LOC 0  MEWS Score 5  MEWS Score Color Red  Assess: if the MEWS score is Yellow or Red  Were vital signs taken at a resting state? No  Focused Assessment Change from prior assessment (see assessment flowsheet)  Does the patient meet 2 or more of the SIRS criteria? Yes  Does the patient have a confirmed or suspected source of infection? Yes  Provider and Rapid Response Notified? Yes  MEWS guidelines implemented *See Row Information* Yes  Treat  Pain Scale 0-10  Pain Score 0  Take Vital Signs  Increase Vital Sign Frequency  Red: Q 1hr X 4 then Q 4hr X 4, if remains red, continue Q 4hrs  Escalate  MEWS: Escalate Red: discuss with charge nurse/RN and provider, consider discussing with RRT  Notify: Charge Nurse/RN  Name of Charge Nurse/RN Notified Biomedical scientist, RN  Date Charge Nurse/RN Notified 12/12/21  Time Charge Nurse/RN Notified 0140  Notify: Provider  Provider Name/Title Morton Amy, NP  Date Provider Notified 12/12/21  Time Provider Notified 0136  Method of Notification Page  Notification Reason Change in status  Provider response At bedside  Date of Provider Response 12/12/21  Time of Provider Response 0140  Notify: Rapid Response  Name of Rapid Response RN Notified Madelon Lips, RN  Date Rapid Response Notified 12/12/21  Time Rapid Response Notified 0140  Document  Patient Outcome Stabilized after interventions  Progress note created (see row info) Yes  Assess: SIRS CRITERIA  SIRS Temperature  0  SIRS Pulse 1  SIRS Respirations  1  SIRS WBC 1  SIRS Score Sum  3

## 2021-12-12 NOTE — Progress Notes (Signed)
       CROSS COVER NOTE  NAME: Christine Obrien MRN: [id number] DOB : 1940/06/17    Date of Service   12/12/2021   HPI/Events of Note   Rapid Response called for Respiratory Distress.   On arrival to bedside Christine Obrien has accessory muscle use and pursed lip breathing. On auscultation she has wheezes in upper lobes and fine crackles. She denies chest pain or palpitations.   Temp 36.7C BP 156/119 HR 119 RR 36 SPO2 100% on Face mask receiving Albuterol.  EKG, compared to 07/2020-->Sinus Tachycardia HR116 LBBB, ST depression with T-wave inversion  Interventions   Plan:  CXR--> Mild pulmonary edema--> 20 mg IV Lasix  BiPAP, wean as able Solu-Medrol x1      This document was prepared using Dragon voice recognition software and may include unintentional dictation errors.  Neomia Glass DNP, MHA, FNP-BC Nurse Practitioner [facility] Pager [phone]

## 2021-12-12 NOTE — TOC Initial Note (Addendum)
Transition of Care [facility]) - Initial/Assessment Note    Patient Details  Name: Christine Obrien MRN: [id number] Date of Birth: [birth year]  [facility] CM/SW Contact:    Candie Chroman, LCSW Phone Number: 12/12/2021, 2:32 PM  Clinical Narrative:   CSW met with patient. Daughters at bedside. CSW introduced role and explained that PT recommendations would be discussed. Patient declined home health. She will notify CSW if she changes her mind prior to admission or contact her PCP if she changes her mind after admission. She is currently on acute oxygen. Will follow for this potential home need. Daughter also said MD mentioned getting a nebulizer machine which MD confirmed. Ordered through [employer]. No further concerns. CSW encouraged patient to contact CSW as needed. CSW will continue to follow patient and her daughters for support and facilitate return home once stable. Daughters will transport her home at discharge.               Expected Discharge Plan: Home/Self Care Barriers to Discharge: Continued Medical Work up   Patient Goals and CMS Choice        Expected Discharge Plan and Services Expected Discharge Plan: Home/Self Care     Post Acute Care Choice: Durable Medical Equipment Living arrangements for the past 2 months: Single Family Home                                      Prior Living Arrangements/Services Living arrangements for the past 2 months: Single Family Home Lives with:: Adult Children Patient language and need for interpreter reviewed:: Yes Do you feel safe going back to the place where you live?: Yes      Need for Family Participation in Patient Care: Yes (Comment) Care giver support system in place?: Yes (comment)   Criminal Activity/Legal Involvement Pertinent to Current Situation/Hospitalization: No - Comment as needed  Activities of Daily Living Home Assistive Devices/Equipment: Walker (specify type) ADL Screening (condition at time of  admission) Patient's cognitive ability adequate to safely complete daily activities?: Yes Is the patient deaf or have difficulty hearing?: No Does the patient have difficulty seeing, even when wearing glasses/contacts?: No Does the patient have difficulty concentrating, remembering, or making decisions?: No Patient able to express need for assistance with ADLs?: Yes Does the patient have difficulty dressing or bathing?: No Independently performs ADLs?: Yes (appropriate for developmental age) Does the patient have difficulty walking or climbing stairs?: Yes Weakness of Legs: Both Weakness of Arms/Hands: Both  Permission Sought/Granted Permission sought to share information with : Family Supports Permission granted to share information with : Yes, Verbal Permission Granted  Share Information with NAME: Edwinna Areola and Latanya Maudlin     Permission granted to share info w Relationship: Daughters  Permission granted to share info w Contact Information: Angela Nevin: [phone], Faythe Dingwall: [phone]  Emotional Assessment Appearance:: Appears stated age Attitude/Demeanor/Rapport: Engaged, Gracious Affect (typically observed): Appropriate, Calm, Pleasant Orientation: : Oriented to Self, Oriented to Place, Oriented to  Time, Oriented to Situation Alcohol / Substance Use: Not Applicable Psych Involvement: No (comment)  Admission diagnosis:  Shortness of breath [R06.02] Acute pulmonary edema (HCC) [J81.0] COPD exacerbation ([place]) [J44.1] Acute hypoxemic respiratory failure ([place]) [J96.01] Patient Active Problem List   Diagnosis Date Noted   Acute hypoxemic respiratory failure ([place]) 12/10/2021   Carotid stenosis, right 05/08/2021   Pain in limb 04/23/2021   Acute CHF (congestive heart failure) (  [place]) 57/04/7791   Acute systolic CHF (congestive heart failure) ([place]) 07/21/2020   CKD stage 3 due to type 2 diabetes mellitus ([place]) 07/21/2020   Nicotine dependence 07/21/2020   ICD (implantable  cardioverter-defibrillator) in place 90/30/0923   Chronic systolic CHF (congestive heart failure), NYHA class 3 ([place]) 02/14/2019   LBBB (left bundle branch block) 12/02/2018   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[place]) 02/12/2018   Subclavian steal syndrome 01/12/2018   Atherosclerotic peripheral vascular disease with intermittent claudication ([place]) 12/30/2017   Pain in both upper extremities 12/30/2017   Hyperlipidemia 12/30/2017   Coronary artery disease involving native heart 12/30/2017   Postoperative atrial fibrillation ([place]) 07/09/2017   S/P CABG x 2 07/09/2017   Stenosis of left subclavian artery ([place]) 07/07/2017   History of non-ST elevation myocardial infarction (NSTEMI) 07/06/2017   Presence of stent in coronary artery 07/06/2017   Chest pain 07/03/2017   NSTEMI (non-ST elevated myocardial infarction) ([place]) 07/03/2017   Impingement syndrome of right shoulder region 03/06/2017   Diabetes ([place]) 03/25/2016   Carotid stenosis 03/25/2016   Anxiety, generalized 08/24/2015   Gastroesophageal reflux disease without esophagitis 07/13/2015   DDD (degenerative disc disease), cervical 04/06/2015   Cervical radiculitis 04/06/2015   Type 2 diabetes mellitus without complications ([place]) 30/10/6224   Carotid bruit 09/15/2011   Symptoms involving cardiovascular system 09/15/2011   Essential hypertension 09/11/2011   Dysthymic disorder 09/11/2011   Hypothyroidism 09/11/2011   Major depressive disorder, recurrent episode, moderate ([place]) 09/11/2011   Dyslipidemia 03/18/2011   PCP:  [name], MD Pharmacy:   [facility] [place], [place] - [address] [place] [address] Phone: [phone] Fax: [place] [id number] [place] [address] [place] [address] Phone: [phone] Fax: [phone]  [facility] [place],  [place] [address] [place] [address] Phone: [phone] Fax: [phone]     Social Determinants of Health (SDOH) Interventions    Readmission Risk Interventions     No data to display

## 2021-12-12 NOTE — Progress Notes (Signed)
Patient having Respiratory distress, immediately administered albuterol and called RT.  Notified MD and rapid response called.  VS documented, hard copy of ECG in the chart.  Patient noted to be in sinus tach at 120.  RR in the 30's.  Rapid RN placed on NRB, bipap en route.    0153: patient on bipap, new orders received for medication.

## 2021-12-12 NOTE — Progress Notes (Signed)
[place] [facility] [place] NAME: Christine Obrien    MR#:  [id number]  DATE OF BIRTH:  1940-10-03  SUBJECTIVE:  patient complained of shortness of breath.  Dter at bedside Per RN-- long runs of VT/NSVT   VITALS:  Blood pressure 116/74, pulse 71, temperature 98.1 F (36.7 C), resp. rate 18, height '5\' 3"'$  (1.6 m), weight 61.2 kg, SpO2 99 %.  PHYSICAL EXAMINATION:   GENERAL:  81 y.o.-year-old patient lying in the bed with no acute distress.  LUNGS: decreased breath sounds bilaterally, no wheezing, rales, rhonchi.  CARDIOVASCULAR: S1, S2 normal. No murmurs, rubs, or gallops.  ABDOMEN: Soft, nontender, nondistended. Bowel sounds present.  EXTREMITIES: No  edema b/l.    NEUROLOGIC: nonfocal  patient is alert and awake SKIN: No obvious rash, lesion, or ulcer.   LABORATORY PANEL:  CBC Recent Labs  Lab 12/12/21 1237  WBC 10.6*  HGB 10.9*  HCT 34.1*  PLT 155     Chemistries  Recent Labs  Lab 12/12/21 0522 12/12/21 1105  NA 144  --   K 3.9  --   CL 108  --   CO2 28  --   GLUCOSE 183*  --   BUN 22  --   CREATININE 1.05*  --   CALCIUM 9.7  --   MG  --  2.2    Cardiac Enzymes No results for input(s): "TROPONINI" in the last 168 hours. RADIOLOGY:  DG Chest Port 1 View  Result Date: 12/12/2021 CLINICAL DATA:  Dyspnea EXAM: PORTABLE CHEST 1 VIEW COMPARISON:  12/10/2021 FINDINGS: The lungs are hyperinflated in keeping with changes of underlying COPD, stable since prior examination. Superimposed peripheral bibasilar interstitial opacities are present most in keeping with mild interstitial pulmonary edema. This appears stable since prior examination. No pneumothorax or pleural effusion. Coronary artery bypass grafting has been performed. Cardiac size is within normal limits. Left subclavian pacemaker defibrillator is unchanged. No acute bone abnormality. IMPRESSION: 1. Stable mild interstitial pulmonary edema. 2. COPD. Electronically Signed   By:  Fidela Salisbury M.D.   On: 12/12/2021 02:23   ECHOCARDIOGRAM COMPLETE  Result Date: 12/11/2021    ECHOCARDIOGRAM REPORT   Patient Name:   Christine Obrien Date of Exam: 12/10/2021 Medical Rec #:  [id number]   Height: Accession #:    [id number]  Weight: Date of Birth:  10/01/1940   BSA: Patient Age:    66 years    BP:           83/61 mmHg Patient Gender: F           HR:           79 bpm. Exam Location:  [facility] Procedure: 2D Echo, Cardiac Doppler and Color Doppler Indications:     Acute respiratory distress R06.03  History:         Patient has no prior history of Echocardiogram examinations.                  CHF, CAD and Previous Myocardial Infarction, Prior CABG,                  Pacemaker and Endarterectomy, COPD, Arrythmias:LBBB; Risk                  Factors:Current Smoker, Diabetes, Hypertension and                  Dyslipidemia.  Sonographer:     Rosalia Hammers Referring Phys:  [place] Phys: Yolonda Kida MD  Sonographer Comments: Image quality was good. IMPRESSIONS  1. Left ventricular ejection fraction, by estimation, is 70 to 75%. The left ventricle has hyperdynamic function. The left ventricle has no regional wall motion abnormalities. There is mild left ventricular hypertrophy. Left ventricular diastolic parameters are consistent with Grade II diastolic dysfunction (pseudonormalization).  2. Right ventricular systolic function is normal. The right ventricular size is normal.  3. The mitral valve is normal in structure. Trivial mitral valve regurgitation.  4. The aortic valve is normal in structure. Aortic valve regurgitation is trivial. FINDINGS  Left Ventricle: Left ventricular ejection fraction, by estimation, is 70 to 75%. The left ventricle has hyperdynamic function. The left ventricle has no regional wall motion abnormalities. The left ventricular internal cavity size was normal in size. There is mild left ventricular hypertrophy. Left ventricular diastolic parameters are consistent with  Grade II diastolic dysfunction (pseudonormalization). Right Ventricle: The right ventricular size is normal. No increase in right ventricular wall thickness. Right ventricular systolic function is normal. Left Atrium: Left atrial size was normal in size. Right Atrium: Right atrial size was normal in size. Pericardium: There is no evidence of pericardial effusion. Mitral Valve: The mitral valve is normal in structure. Trivial mitral valve regurgitation. Tricuspid Valve: The tricuspid valve is normal in structure. Tricuspid valve regurgitation is mild. Aortic Valve: The aortic valve is normal in structure. Aortic valve regurgitation is trivial. Aortic valve mean gradient measures 7.0 mmHg. Aortic valve peak gradient measures 12.8 mmHg. Aortic valve area, by VTI measures 1.29 cm. Pulmonic Valve: The pulmonic valve was normal in structure. Pulmonic valve regurgitation is not visualized. Aorta: The ascending aorta was not well visualized. IAS/Shunts: No atrial level shunt detected by color flow Doppler.  LEFT VENTRICLE PLAX 2D LVIDd:         4.35 cm   Diastology LVIDs:         2.62 cm   LV e' medial:    4.35 cm/s LV PW:         1.40 cm   LV E/e' medial:  37.7 LV IVS:        1.28 cm   LV e' lateral:   4.90 cm/s LVOT diam:     1.60 cm   LV E/e' lateral: 33.5 LV SV:         49 LVOT Area:     2.01 cm  RIGHT VENTRICLE RV Basal diam:  2.82 cm RV Mid diam:    3.12 cm RV S prime:     13.20 cm/s TAPSE (M-mode): 2.3 cm LEFT ATRIUM             RIGHT ATRIUM LA diam:        3.50 cm RA Area:     16.00 cm LA Vol (A2C):   43.5 ml RA Volume:   41.50 ml LA Vol (A4C):   38.7 ml LA Biplane Vol: 41.8 ml  AORTIC VALVE AV Area (Vmax):    1.21 cm AV Area (Vmean):   1.22 cm AV Area (VTI):     1.29 cm AV Vmax:           179.00 cm/s AV Vmean:          129.000 cm/s AV VTI:            0.381 m AV Peak Grad:      12.8 mmHg AV Mean Grad:      7.0 mmHg LVOT Vmax:  108.00 cm/s LVOT Vmean:        78.400 cm/s LVOT VTI:          0.244 m LVOT/AV  VTI ratio: 0.64  AORTA Ao Root diam: 2.70 cm MITRAL VALVE                TRICUSPID VALVE MV Area (PHT): 3.42 cm     TR Peak grad:   49.0 mmHg MV Decel Time: 222 msec     TR Vmax:        350.00 cm/s MV E velocity: 164.00 cm/s MV A velocity: 160.00 cm/s  SHUNTS MV E/A ratio:  1.02         Systemic VTI:  0.24 m                             Systemic Diam: 1.60 cm Dwayne D Callwood MD Electronically signed by Yolonda Kida MD Signature Date/Time: 12/11/2021/5:30:23 PM    Final     Assessment and Plan ETHELYN CERNIGLIA is a 81 y.o. female with medical history significant of COPD, CHF chronic systolic, coronary artery disease, paroxysmal atrial fibrillation on chronic anticoagulation, diabetes without complication, depression, hypothyroidism comes to the emergency room with increasing shortness of breath for couple days along with cough which patient tells me is chronic. She denied any chest pain. She overall feels weak. She was not able to clearly tell me whether she had fever with chills.   Acute on chronic respiratory failure secondary to COPD exacerbation acute on chronic mild congestive heart failure systolic suspected bronchitis/chronic cough smokers -- patient's blood pressure is soft will hold diuretics -- she received empiric antibiotic--- will continue Rocephin and Zithromax -- continue prednisone po  -- nebulizer/breathing treatment -- oxygen saturation more than 90%-- sats dropped down to upper 80s with exertion. Will place patient on oxygen. -- Give one dose of Lasix 20 mg times one and start IV Solu-Medrol -- Pro calcitonin<0.1 --assess for home oxygen   Tachyarrythmia -- runs of VT/NSVT -lytes ok --will resume BB now that bp is improved --cardiology to see pt--d/w [facility] cards --ICD interrogation  Related hypotension -- held BP meds -- received gentle IV fluids--- DC it -- BP  stable--now resumed BB   Hypokalemia -- IV KCl   Lactic acidosis -- patient is not meet sepsis criteria --  continue above treatment -- Trend lactate   Hypothyroidism -- continue Synthroid  PT OT to work with patient [facility] for discharge planning    Advance Care Planning:   Code Status: Full Code per pt   Consults: none   Family Communication: dter Suanne Marker at bedside  DVT Prophylaxis : eliquis Level of care: Telemetry Cardiac Status is: Inpatient Remains inpatient appropriate because: sob, weakness Anticipate 1-2 days    TOTAL TIME TAKING CARE OF THIS PATIENT: 35 minutes.  >50% time spent on counselling and coordination of care  Note: This dictation was prepared with Dragon dictation along with smaller phrase technology. Any transcriptional errors that result from this process are unintentional.  Fritzi Mandes M.D    [facility]   CC: Primary care physician; [name], MD

## 2021-12-12 NOTE — Progress Notes (Signed)
Pt removed from bipap to nasal cannula. Patient WOB looks good with RR of 16 and sats of 99% on Bipap. Neb treatment being administered as well.

## 2021-12-13 DIAGNOSIS — J9621 Acute and chronic respiratory failure with hypoxia: Secondary | ICD-10-CM

## 2021-12-13 DIAGNOSIS — J441 Chronic obstructive pulmonary disease with (acute) exacerbation: Secondary | ICD-10-CM | POA: Diagnosis not present

## 2021-12-13 DIAGNOSIS — I480 Paroxysmal atrial fibrillation: Secondary | ICD-10-CM | POA: Diagnosis not present

## 2021-12-13 DIAGNOSIS — I5023 Acute on chronic systolic (congestive) heart failure: Secondary | ICD-10-CM | POA: Diagnosis not present

## 2021-12-13 LAB — BASIC METABOLIC PANEL
Anion gap: 6 (ref 5–15)
BUN: 26 mg/dL — ABNORMAL HIGH (ref 8–23)
CO2: 33 mmol/L — ABNORMAL HIGH (ref 22–32)
Calcium: 9.4 mg/dL (ref 8.9–10.3)
Chloride: 104 mmol/L (ref 98–111)
Creatinine, Ser: 0.94 mg/dL (ref 0.44–1.00)
GFR, Estimated: >60 mL/min (ref >60)
Glucose, Bld: 99 mg/dL (ref 70–99)
Potassium: 3.6 mmol/L (ref 3.5–5.1)
Sodium: 143 mmol/L (ref 135–145)

## 2021-12-13 MED ORDER — PREDNISONE 20 MG PO TABS: 20.0000 mg | ORAL_TABLET | Freq: Every day | ORAL | Status: DC

## 2021-12-13 MED ORDER — IPRATROPIUM-ALBUTEROL 0.5-2.5 (3) MG/3ML IN SOLN: 3.0000 mL | Freq: Two times a day (BID) | RESPIRATORY_TRACT | 1 refills | Status: AC

## 2021-12-13 MED ORDER — FUROSEMIDE 20 MG PO TABS: 20.0000 mg | ORAL_TABLET | Freq: Every day | ORAL | Status: DC

## 2021-12-13 MED ORDER — AZITHROMYCIN 250 MG PO TABS: ORAL_TABLET | ORAL | 0 refills | Status: DC

## 2021-12-13 MED ORDER — PREDNISONE 20 MG PO TABS: 20.0000 mg | ORAL_TABLET | Freq: Every day | ORAL | 0 refills | Status: AC

## 2021-12-13 NOTE — Care Management Important Message (Signed)
Important Message  Patient Details  Name: Christine Obrien MRN: [id number] Date of Birth: 1941-01-20   Medicare Important Message Given:  Yes     Dannette Barbara 12/13/2021, 10:52 AM

## 2021-12-13 NOTE — Plan of Care (Signed)

## 2021-12-13 NOTE — Progress Notes (Signed)
Mobility Specialist - Progress Note   12/13/21 1019  Mobility  Activity Ambulated independently in room  Level of Assistance Independent  Assistive Device None  Distance Ambulated (ft) 20 ft  Activity Response Tolerated well  $Mobility charge 1 Mobility   Pt OOB on RA upon arrival. Pt ambulates around room for Pericare needs and preparing for discharge. Pt left in room with needs in reach.   Gretchen Short  Mobility Specialist  12/13/21 10:20 AM

## 2021-12-13 NOTE — TOC Transition Note (Signed)
Transition of Care [facility]) - CM/SW Discharge Note   Patient Details  Name: Christine Obrien MRN: [id number] Date of Birth: Apr 01, 1941  Transition of Care [facility]) CM/SW Contact:  Candie Chroman, LCSW Phone Number: 12/13/2021, 12:20 PM   Clinical Narrative:   Patient has orders to discharge home today. She has been weaned to room air. No further needs. CSW signing off.  Final next level of care: Home/Self Care Barriers to Discharge: Barriers Resolved   Patient Goals and CMS Choice        Discharge Placement                Patient to be transferred to facility by: Daughters   Patient and family notified of of transfer: 12/13/21  Discharge Plan and Services     Post Acute Care Choice: Durable Medical Equipment          DME Arranged: Nebulizer machine DME Agency: [employer] Date DME Agency Contacted: 12/12/21   Representative spoke with at DME Agency: Suanne Marker            Social Determinants of Health ([place]) Interventions     Readmission Risk Interventions     No data to display

## 2021-12-13 NOTE — Consult Note (Signed)
PHARMACY CONSULT NOTE - FOLLOW UP  Pharmacy Consult for Electrolyte Monitoring and Replacement   Recent Labs: Potassium (mmol/L)  Date Value  12/12/2021 3.9  04/01/2013 3.6   Magnesium (mg/dL)  Date Value  12/12/2021 2.2  03/30/2013 2.0   Calcium (mg/dL)  Date Value  12/12/2021 9.7   Calcium, Total (mg/dL)  Date Value  04/01/2013 9.0   Albumin (g/dL)  Date Value  12/16/2020 3.9  03/30/2013 3.3 (L)   Phosphorus (mg/dL)  Date Value  12/12/2021 2.6   Sodium (mmol/L)  Date Value  12/12/2021 144  04/01/2013 142   Assessment: Patient admitted with SOB, HF exacerbation, hypotension, and suspected bronchitis. Pharmacy consulted for electrolytes replacement  Noted IVF NS with 24mq KCL given from 8/22 '@1715'$  to 8/23'@0125'$ .   Goal of Therapy:  Electrolytes WNL  Plan:  No replacement needed this morning. Will continue to follow and replace electrolytes per protocol. Anticipate discharge today/tomorrow.  BLorna Dibble PharmD, BIsland Eye Surgicenter LLCClinical Pharmacist 12/13/2021 7:58 AM

## 2021-12-13 NOTE — Progress Notes (Signed)
Mobile NOTE       Patient ID: Christine Obrien MRN: [id number] DOB/AGE: [birth year] 81 y.o.  Admit date: 12/10/2021 Referring Physician Dr. Fritzi Mandes  Primary Physician Dr. Lovie Macadamia Primary Cardiologist Dr, Nehemiah Massed Reason for Consultation ?VT on telemetry & CHF  HPI: Christine Obrien is an 89LGX with a PMH of CAD s/p CABG x2 in 06/2015 (LIMA to LAD, SVG to OM1), HF with recovered EF (70%, previously 35%) s/p CRT-D 04/2019, paroxysmal atrial fibrillation on Eliquis, COPD, hyperlipidemia, type 2 diabetes who presented to [facility] ED 12/10/2021 with 2 days of increasing shortness of breath and generalized weakness.  She is being treated for a COPD exacerbation with associated bronchitis vs pneumonia vs pulmonary edema.  Cardiology is consulted on hospital day 2 for assistance with her heart failure and question of VT noted on telemetry.  Interval History: -Feels much better today, diuresed well overnight, weaned off supplemental oxygen -Heart rate controlled in sinus rhythm in the 60s to 70s on telemetry with occasional paced beats, no further episodes of tachycardia -No chest pain, shortness of breath, orthopnea, or PND. -Eager to go home  Review of systems complete and found to be negative unless listed above     Past Medical History:  Diagnosis Date   AICD (automatic cardioverter/defibrillator) present    Anxiety    Asthma    Atherosclerotic peripheral vascular disease with intermittent claudication (HCC)    Cancer ([place])    cervical   Carotid artery occlusion    CHF (congestive heart failure), NYHA class III (HCC)    CKD (chronic kidney disease), stage III (HCC)    COPD (chronic obstructive pulmonary disease) ([place])    Coronary artery disease    Current use of long term anticoagulation    Depression    Diabetes mellitus without complication (HCC)    GERD (gastroesophageal reflux disease)    Gout    HLD (hyperlipidemia)    Hx of CABG    Hypertension     Hypothyroidism    Kidney stones    LBBB (left bundle branch block)    Myocardial infarction ([place])    Presence of permanent cardiac pacemaker    Subclavian steal syndrome     Past Surgical History:  Procedure Laterality Date   ABDOMINAL HYSTERECTOMY     partial   CHOLECYSTECTOMY     COLONOSCOPY WITH PROPOFOL N/A 01/11/2020   Procedure: COLONOSCOPY WITH PROPOFOL;  Surgeon: Toledo, Benay Pike, MD;  Location: [facility] ENDOSCOPY;  Service: Gastroenterology;  Laterality: N/A;   CORONARY ANGIOPLASTY     CORONARY ARTERY BYPASS GRAFT     double   ENDARTERECTOMY Right 05/08/2021   Procedure: ENDARTERECTOMY CAROTID;  Surgeon: Algernon Huxley, MD;  Location: [facility] ORS;  Service: Vascular;  Laterality: Right;   EYE SURGERY Bilateral 08/2009   cataracts   HIATAL HERNIA REPAIR  1980s   LEFT HEART CATH AND CORONARY ANGIOGRAPHY N/A 07/03/2017   Procedure: LEFT HEART CATH AND CORONARY ANGIOGRAPHY;  Surgeon: Corey Skains, MD;  Location: [place] [facility];  Service: Cardiovascular;  Laterality: N/A;   LOWER EXTREMITY ANGIOGRAPHY Left 01/11/2018   Procedure: LOWER EXTREMITY ANGIOGRAPHY;  Surgeon: Algernon Huxley, MD;  Location: [place] [facility] LAB;  Service: Cardiovascular;  Laterality: Left;   THYROID SURGERY     pt does not remember if removed or partial removal    Medications Prior to Admission  Medication Sig Dispense Refill Last Dose   apixaban (ELIQUIS) 5 MG TABS tablet Take  5 mg by mouth 2 (two) times daily.   12/09/2021 at 2000   aspirin EC 81 MG tablet Take 1 tablet (81 mg total) by mouth daily. Swallow whole. 30 tablet 11 12/09/2021   buPROPion ER (WELLBUTRIN SR) 100 MG 12 hr tablet Take 100 mg by mouth 2 (two) times daily.   12/09/2021   empagliflozin (JARDIANCE) 10 MG TABS tablet Take 10 mg by mouth daily.   12/09/2021   ezetimibe (ZETIA) 10 MG tablet Take 10 mg by mouth daily.   12/09/2021   ferrous sulfate 325 (65 FE) MG tablet Take 325 mg by mouth daily with breakfast.  0 12/09/2021    fluticasone-salmeterol (ADVAIR) 250-50 MCG/ACT AEPB Inhale 1 puff into the lungs in the morning and at bedtime.   12/10/2021   furosemide (LASIX) 20 MG tablet Take 1 tablet (20 mg total) by mouth daily. 15 tablet 0 12/09/2021   glipiZIDE (GLUCOTROL) 5 MG tablet Take 5 mg by mouth 2 (two) times daily before a meal.   12/09/2021   icosapent Ethyl (VASCEPA) 1 g capsule Take 1 g by mouth 2 (two) times daily with a meal.   12/09/2021   levothyroxine (SYNTHROID) 137 MCG tablet Take 1 tablet (137 mcg total) by mouth daily. 30 tablet 3 12/09/2021   loratadine (CLARITIN) 10 MG tablet Take 10 mg by mouth daily.   12/09/2021 at prn   losartan (COZAAR) 25 MG tablet Take 25 mg by mouth daily.   12/09/2021   metFORMIN (GLUCOPHAGE-XR) 500 MG 24 hr tablet Take 500 mg by mouth 2 (two) times daily.   12/09/2021   metoprolol tartrate (LOPRESSOR) 25 MG tablet Take 1 tablet (25 mg total) by mouth 2 (two) times daily. 60 tablet 0 12/09/2021   Multiple Vitamin (MULTIVITAMIN) capsule Take 1 capsule by mouth daily.   12/09/2021   omeprazole (PRILOSEC) 20 MG capsule Take 20 mg by mouth 2 (two) times daily before a meal.   12/09/2021   venlafaxine (EFFEXOR) 75 MG tablet Take 75 mg by mouth 2 (two) times daily with a meal.   12/09/2021   Acetaminophen (TYLENOL PO) Take 500 mg by mouth.    prn at prn   albuterol (VENTOLIN HFA) 108 (90 Base) MCG/ACT inhaler SMARTSIG:2 Inhalation Via Inhaler Every 4 Hours PRN   prn at prn   fluticasone (FLONASE) 50 MCG/ACT nasal spray Place 2 sprays into the nose daily.   prn at prn   gabapentin (NEURONTIN) 100 MG capsule Take 100 mg by mouth 3 (three) times daily.   prn at prn   Social History   Socioeconomic History   Marital status: Widowed    Spouse name: Not on file   Number of children: Not on file   Years of education: Not on file   Highest education level: Not on file  Occupational History   Not on file  Tobacco Use   Smoking status: Every Day    Packs/day: 1.00    Types: Cigarettes    Smokeless tobacco: Never  Vaping Use   Vaping Use: Never used  Substance and Sexual Activity   Alcohol use: No   Drug use: No   Sexual activity: Not on file  Other Topics Concern   Not on file  Social History Narrative   Not on file   Social Determinants of Health   Financial Resource Strain: Not on file  Food Insecurity: Not on file  Transportation Needs: Not on file  Physical Activity: Not on file  Stress: Not on  file  Social Connections: Not on file  Intimate Partner Violence: Not on file    Family History  Problem Relation Age of Onset   Hypertension Mother    Hypertension Father    Diabetes Sister    Cancer Brother    Diabetes Brother    Breast cancer Neg Hx       PHYSICAL EXAM General: Pleasant elderly Caucasian female, laying flat in PCU bed  HEENT:  Normocephalic and atraumatic. Neck:  No JVD.  Lungs: Normal respiratory effort on room air decreased breath sounds bilaterally with fine crackles.  No appreciable wheezes Heart: HRRR . Normal S1 and S2 without gallops or murmurs.  Abdomen: Non-distended appearing.  Msk: Normal strength and tone for age. Extremities: Warm and well perfused. No clubbing, cyanosis.  No peripheral edema.  Neuro: Alert and oriented X 3. Psych:  Answers questions appropriately.   Labs: Basic Metabolic Panel: Recent Labs    12/10/21 0917 12/12/21 0522 12/12/21 1105  NA 145 144  --   K 3.1* 3.9  --   CL 110 108  --   CO2 27 28  --   GLUCOSE 146* 183*  --   BUN 10 22  --   CREATININE 0.92 1.05*  --   CALCIUM 9.2 9.7  --   MG  --   --  2.2  PHOS  --   --  2.6    Liver Function Tests: No results for input(s): "AST", "ALT", "ALKPHOS", "BILITOT", "PROT", "ALBUMIN" in the last 72 hours. No results for input(s): "LIPASE", "AMYLASE" in the last 72 hours. CBC: Recent Labs    12/10/21 0917 12/12/21 1237  WBC 10.3 10.6*  NEUTROABS 8.1*  --   HGB 11.8* 10.9*  HCT 37.6 34.1*  MCV 96.4 96.9  PLT 166 155    Cardiac  Enzymes: Recent Labs    12/10/21 0917 12/10/21 1151  TROPONINIHS 24* 25*    BNP: Invalid input(s): "POCBNP" D-Dimer: No results for input(s): "DDIMER" in the last 72 hours. Hemoglobin A1C: No results for input(s): "HGBA1C" in the last 72 hours. Fasting Lipid Panel: No results for input(s): "CHOL", "HDL", "LDLCALC", "TRIG", "CHOLHDL", "LDLDIRECT" in the last 72 hours. Thyroid Function Tests: No results for input(s): "TSH", "T4TOTAL", "T3FREE", "THYROIDAB" in the last 72 hours.  Invalid input(s): "FREET3" Anemia Panel: No results for input(s): "VITAMINB12", "FOLATE", "FERRITIN", "TIBC", "IRON", "RETICCTPCT" in the last 72 hours.  DG Chest Port 1 View  Result Date: 12/12/2021 CLINICAL DATA:  Dyspnea EXAM: PORTABLE CHEST 1 VIEW COMPARISON:  12/10/2021 FINDINGS: The lungs are hyperinflated in keeping with changes of underlying COPD, stable since prior examination. Superimposed peripheral bibasilar interstitial opacities are present most in keeping with mild interstitial pulmonary edema. This appears stable since prior examination. No pneumothorax or pleural effusion. Coronary artery bypass grafting has been performed. Cardiac size is within normal limits. Left subclavian pacemaker defibrillator is unchanged. No acute bone abnormality. IMPRESSION: 1. Stable mild interstitial pulmonary edema. 2. COPD. Electronically Signed   By: Fidela Salisbury M.D.   On: 12/12/2021 02:23     Radiology: [facility] Chest Port 1 View  Result Date: 12/12/2021 CLINICAL DATA:  Dyspnea EXAM: PORTABLE CHEST 1 VIEW COMPARISON:  12/10/2021 FINDINGS: The lungs are hyperinflated in keeping with changes of underlying COPD, stable since prior examination. Superimposed peripheral bibasilar interstitial opacities are present most in keeping with mild interstitial pulmonary edema. This appears stable since prior examination. No pneumothorax or pleural effusion. Coronary artery bypass grafting has been performed. Cardiac size  is within  normal limits. Left subclavian pacemaker defibrillator is unchanged. No acute bone abnormality. IMPRESSION: 1. Stable mild interstitial pulmonary edema. 2. COPD. Electronically Signed   By: Fidela Salisbury M.D.   On: 12/12/2021 02:23   ECHOCARDIOGRAM COMPLETE  Result Date: 12/11/2021    ECHOCARDIOGRAM REPORT   Patient Name:   NADENE WITHERSPOON Date of Exam: 12/10/2021 Medical Rec #:  [id number]   Height: Accession #:    [id number]  Weight: Date of Birth:  [birth year]   BSA: Patient Age:    41 years    BP:           83/61 mmHg Patient Gender: F           HR:           79 bpm. Exam Location:  [facility] Procedure: 2D Echo, Cardiac Doppler and Color Doppler Indications:     Acute respiratory distress R06.03  History:         Patient has no prior history of Echocardiogram examinations.                  CHF, CAD and Previous Myocardial Infarction, Prior CABG,                  Pacemaker and Endarterectomy, COPD, Arrythmias:LBBB; Risk                  Factors:Current Smoker, Diabetes, Hypertension and                  Dyslipidemia.  Sonographer:     Rosalia Hammers Referring Phys:  [place] Diagnosing Phys: Yolonda Kida MD  Sonographer Comments: Image quality was good. IMPRESSIONS  1. Left ventricular ejection fraction, by estimation, is 70 to 75%. The left ventricle has hyperdynamic function. The left ventricle has no regional wall motion abnormalities. There is mild left ventricular hypertrophy. Left ventricular diastolic parameters are consistent with Grade II diastolic dysfunction (pseudonormalization).  2. Right ventricular systolic function is normal. The right ventricular size is normal.  3. The mitral valve is normal in structure. Trivial mitral valve regurgitation.  4. The aortic valve is normal in structure. Aortic valve regurgitation is trivial. FINDINGS  Left Ventricle: Left ventricular ejection fraction, by estimation, is 70 to 75%. The left ventricle has hyperdynamic function. The left ventricle has no regional  wall motion abnormalities. The left ventricular internal cavity size was normal in size. There is mild left ventricular hypertrophy. Left ventricular diastolic parameters are consistent with Grade II diastolic dysfunction (pseudonormalization). Right Ventricle: The right ventricular size is normal. No increase in right ventricular wall thickness. Right ventricular systolic function is normal. Left Atrium: Left atrial size was normal in size. Right Atrium: Right atrial size was normal in size. Pericardium: There is no evidence of pericardial effusion. Mitral Valve: The mitral valve is normal in structure. Trivial mitral valve regurgitation. Tricuspid Valve: The tricuspid valve is normal in structure. Tricuspid valve regurgitation is mild. Aortic Valve: The aortic valve is normal in structure. Aortic valve regurgitation is trivial. Aortic valve mean gradient measures 7.0 mmHg. Aortic valve peak gradient measures 12.8 mmHg. Aortic valve area, by VTI measures 1.29 cm. Pulmonic Valve: The pulmonic valve was normal in structure. Pulmonic valve regurgitation is not visualized. Aorta: The ascending aorta was not well visualized. IAS/Shunts: No atrial level shunt detected by color flow Doppler.  LEFT VENTRICLE PLAX 2D LVIDd:         4.35 cm   Diastology LVIDs:  2.62 cm   LV e' medial:    4.35 cm/s LV PW:         1.40 cm   LV E/e' medial:  37.7 LV IVS:        1.28 cm   LV e' lateral:   4.90 cm/s LVOT diam:     1.60 cm   LV E/e' lateral: 33.5 LV SV:         49 LVOT Area:     2.01 cm  RIGHT VENTRICLE RV Basal diam:  2.82 cm RV Mid diam:    3.12 cm RV S prime:     13.20 cm/s TAPSE (M-mode): 2.3 cm LEFT ATRIUM             RIGHT ATRIUM LA diam:        3.50 cm RA Area:     16.00 cm LA Vol (A2C):   43.5 ml RA Volume:   41.50 ml LA Vol (A4C):   38.7 ml LA Biplane Vol: 41.8 ml  AORTIC VALVE AV Area (Vmax):    1.21 cm AV Area (Vmean):   1.22 cm AV Area (VTI):     1.29 cm AV Vmax:           179.00 cm/s AV Vmean:           129.000 cm/s AV VTI:            0.381 m AV Peak Grad:      12.8 mmHg AV Mean Grad:      7.0 mmHg LVOT Vmax:         108.00 cm/s LVOT Vmean:        78.400 cm/s LVOT VTI:          0.244 m LVOT/AV VTI ratio: 0.64  AORTA Ao Root diam: 2.70 cm MITRAL VALVE                TRICUSPID VALVE MV Area (PHT): 3.42 cm     TR Peak grad:   49.0 mmHg MV Decel Time: 222 msec     TR Vmax:        350.00 cm/s MV E velocity: 164.00 cm/s MV A velocity: 160.00 cm/s  SHUNTS MV E/A ratio:  1.02         Systemic VTI:  0.24 m                             Systemic Diam: 1.60 cm Yolonda Kida MD Electronically signed by Yolonda Kida MD Signature Date/Time: 12/11/2021/5:30:23 PM    Final    DG Chest 2 View  Result Date: 12/10/2021 CLINICAL DATA:  Shortness of breath for 1 day. EXAM: CHEST - 2 VIEW COMPARISON:  07/21/2020 FINDINGS: UPPER limits normal heart size, CABG changes and LEFT-sided pacemaker/AICD again noted. Mild interstitial opacities and trace bilateral pleural effusions are noted. There is no evidence of pneumothorax or acute bony abnormality. IMPRESSION: Mild interstitial opacities and trace bilateral pleural, favor mild pulmonary edema. Electronically Signed   By: Margarette Canada M.D.   On: 12/10/2021 09:29    ECHO LVEF 70 to 75%, G2 DD 11/2021  TELEMETRY reviewed by me (LT) 12/13/2021 : Sinus rhythm rate 60s to 70s with occasional paced beats, without further episodes of atrial fibrillation with RVR  EKG reviewed by me: NSR LBBB rate 90   Data reviewed by me (LT) 12/13/2021: Hospitalist progress note, telemetry, CBC, ordered BMP, discussed with hospitalist  ASSESSMENT  AND PLAN:  Christine Obrien is an 52VOH with a PMH of CAD s/p CABG x2 in 06/2015 (LIMA to LAD, SVG to OM1), HF with recovered EF (70%, previously 35%) s/p CRT-D 04/2019, paroxysmal atrial fibrillation on Eliquis, COPD, hyperlipidemia, type 2 diabetes who presented to [facility] ED 12/10/2021 with 2 days of increasing shortness of breath and generalized weakness.   She is being treated for a COPD exacerbation with associated bronchitis vs pneumonia vs pulmonary edema.  Cardiology is consulted on hospital day 2 for assistance with her heart failure and question of VT noted on telemetry.  #Acute on chronic respiratory failure 2/2 COPD exacerbation #Acute on chronic HF with recovered EF (prev 35%, now 70% with g2dd) s/p CRT-D  The patient presented to [facility] ED 12/10/2021 with shortness of breath and hypoxia, and a cough that was slightly worsening than usual, felt like her usual COPD exacerbations.  She received 18 hours of continuous IV fluids and had an episode of what sounds like PND the early morning of 8/24 for which a rapid response was called.  She improved with BiPAP, IV Lasix, albuterol, and Solu-Medrol.  She is on supplemental oxygen this afternoon, although feels better overall.  Her BNP was elevated on admission to 900, chest x-ray 8/24 shows interval worsening of her central venous congestion.  Although she has not overtly volume overloaded appearing on exam. -Agree with current therapy per primary team for treatment of her COPD exacerbation -S/p IV Lasix 20 mg x 5 doses with clinical improvement and great diuresis overnight -We will send home with 20 mg p.o. Lasix once daily -Continue GDMT with metoprolol tartrate 12.5 mg twice daily Jardiance 10 mg once daily  (intolerant of ACE inhibitors) -Okay for discharge today from a cardiac standpoint.  She has a follow-up appointment with Jettie Booze, PA-C at [facility] cardiology 12/19/2021  #Paroxysmal atrial fibrillation #Chronic LBBB -History of paroxysmal atrial fibrillation, episode on telemetry 8/24 confirmed with device interrogation to be AF/atrial tachycardia and not VT (no change in QRS axis) -Continue metoprolol tartrate 12.5 mg twice daily for rate control -Continue Eliquis 5 mg twice daily for stroke risk reduction CHA2DS2-VASc 7 . (of note, she is 81yo and her weight is 61.6 kg, recommend monitoring of  her weight in the future to consider dose reduction)  #CAD s/p CABG x2 06/2015 Denies chest pain, troponins minimally elevated with a flat trend, inconsistent with ACS. -Continue aspirin, Zetia 10 mg once daily (intolerant of statins)  This patient's plan of care was discussed and created with Dr. Saralyn Pilar and he is in agreement.  Signed: Tristan Schroeder , PA-C 12/13/2021, 8:11 AM [facility] Cardiology

## 2021-12-13 NOTE — Discharge Summary (Signed)
Physician Discharge Summary   Patient: Christine Obrien MRN: [id number] DOB: 02/25/1941  Admit date:     12/10/2021  Discharge date: 12/13/21  Discharge Physician: Fritzi Mandes   PCP: [name], MD   Recommendations at discharge:   follow-up Dr. Nehemiah Massed on your scheduled appointment follow-up with Dr. Lovie Macadamia in 1 to 2 week  Discharge Diagnoses: acute on chronic respiratory failure secondary to COPD exacerbation with acute on chronic congestive heart failure systolic acute bronchitis   Hospital Course:  Christine Obrien is a 81 y.o. female with medical history significant of COPD, CHF chronic systolic, coronary artery disease, paroxysmal atrial fibrillation on chronic anticoagulation, diabetes without complication, depression, hypothyroidism comes to the emergency room with increasing shortness of breath for couple days along with cough which patient tells me is chronic. She denied any chest pain. She overall feels weak. She was not able to clearly tell me whether she had fever with chills.   Acute on chronic respiratory failure secondary to COPD exacerbation acute on chronic mild congestive heart failure systolic suspected bronchitis/chronic cough smokers -- patient's blood pressure is soft will hold diuretics -- she received empiric antibiotic--- will continue Rocephin and Zithromax--change to po zithromax -- continue prednisone po  -- nebulizer/breathing treatment -- oxygen saturation more than 90%-- sats dropped down to upper 80s with exertion. Will place patient on oxygen. -- Patient received IV Lasix and IV Solu-Medrol. Diuresis well. She is on room air. Will get her home nebulizer. -- Pro calcitonin<0.1 --assess for home oxygen-- does not qualify -- feels a lot better. Will discharged to home.  Tachyarrythmia H/o PAF on po eliquis -- runs of VT/NSVT-- on tele monitor --lytes ok --will resume BB now that bp is improved --cardiology to see pt--d/w [facility] cards-- upper sheet  input. 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 Nehemiah Massed -- now in sinus rhythm beta-blocker continued   Related hypotension -- held BP meds -- received gentle IV fluids--- DC it -- BP  stable--now resumed BB, losartan   Hypokalemia -- received IV KCl   Lactic acidosis -- patient is not meet sepsis criteria -- continue above treatment -- Trend lactate   Hypothyroidism -- continue Synthroid   PT OT to work with patient-- patient declined home health per [facility]   Advance Care Planning:   Code Status: Full Code per pt   Consults: none   Family Communication: dter Suanne Marker on the phone   DVT Prophylaxis : eliquis   DC to home     Consultants: [facility] cardiology Procedures performed: none Disposition: Home Diet recommendation:  Discharge Diet Orders (From admission, onward)     Start     Ordered   12/13/21 0000  Diet - low sodium heart healthy        12/13/21 1159           Cardiac and Carb modified diet DISCHARGE MEDICATION: Allergies as of 12/13/2021       Reactions   Ace Inhibitors    Other reaction(s): Cough   Atorvastatin    Other reaction(s): Muscle Pain   Statins    Other reaction(s): Muscle Pain Muscle pain   Sulfa Antibiotics    Stomach pains        Medication List     TAKE these medications    albuterol 108 (90 Base) MCG/ACT inhaler Commonly known as: VENTOLIN HFA SMARTSIG:2 Inhalation Via Inhaler Every 4 Hours PRN   apixaban 5  MG Tabs tablet Commonly known as: ELIQUIS Take 5 mg by mouth 2 (two) times daily.   aspirin EC 81 MG tablet Take 1 tablet (81 mg total) by mouth daily. Swallow whole.   azithromycin 250 MG tablet Commonly known as: ZITHROMAX Take daily as directed Start taking on: December 14, 2021   buPROPion ER 100 MG 12 hr tablet Commonly known as: WELLBUTRIN SR Take 100 mg by mouth 2 (two) times daily.   empagliflozin 10 MG Tabs  tablet Commonly known as: JARDIANCE Take 10 mg by mouth daily.   ezetimibe 10 MG tablet Commonly known as: ZETIA Take 10 mg by mouth daily.   ferrous sulfate 325 (65 FE) MG tablet Take 325 mg by mouth daily with breakfast.   fluticasone 50 MCG/ACT nasal spray Commonly known as: FLONASE Place 2 sprays into the nose daily.   fluticasone-salmeterol 250-50 MCG/ACT Aepb Commonly known as: ADVAIR Inhale 1 puff into the lungs in the morning and at bedtime.   furosemide 20 MG tablet Commonly known as: LASIX Take 1 tablet (20 mg total) by mouth daily.   gabapentin 100 MG capsule Commonly known as: NEURONTIN Take 100 mg by mouth 3 (three) times daily.   glipiZIDE 5 MG tablet Commonly known as: GLUCOTROL Take 5 mg by mouth 2 (two) times daily before a meal.   icosapent Ethyl 1 g capsule Commonly known as: VASCEPA Take 1 g by mouth 2 (two) times daily with a meal.   ipratropium-albuterol 0.5-2.5 (3) MG/3ML Soln Commonly known as: DUONEB Take 3 mLs by nebulization 2 (two) times daily.   levothyroxine 137 MCG tablet Commonly known as: SYNTHROID Take 1 tablet (137 mcg total) by mouth daily.   loratadine 10 MG tablet Commonly known as: CLARITIN Take 10 mg by mouth daily.   losartan 25 MG tablet Commonly known as: COZAAR Take 25 mg by mouth daily.   metFORMIN 500 MG 24 hr tablet Commonly known as: GLUCOPHAGE-XR Take 500 mg by mouth 2 (two) times daily.   metoprolol tartrate 25 MG tablet Commonly known as: LOPRESSOR Take 1 tablet (25 mg total) by mouth 2 (two) times daily.   multivitamin capsule Take 1 capsule by mouth daily.   omeprazole 20 MG capsule Commonly known as: PRILOSEC Take 20 mg by mouth 2 (two) times daily before a meal.   predniSONE 20 MG tablet Commonly known as: DELTASONE Take 1 tablet (20 mg total) by mouth daily with breakfast for 4 days.   TYLENOL PO Take 500 mg by mouth.   venlafaxine 75 MG tablet Commonly known as: EFFEXOR Take 75 mg by  mouth 2 (two) times daily with a meal.               Durable Medical Equipment  (From admission, onward)           Start     Ordered   12/12/21 1427  For home use only DME Nebulizer machine  Once       Question Answer Comment  Patient needs a nebulizer to treat with the following condition COPD exacerbation ([place])   Length of Need Lifetime      12/12/21 1426            Follow-up Information     Corey Skains, MD. Go in 1 week(s).   Specialty: Cardiology Why: has appointment with Jettie Booze, PA-C on 8/31 Contact information: [address] [facility] [place] [address] [phone]  [name], MD. Schedule an appointment as soon as possible for a visit in 1 week(s).   Specialty: Family Medicine Why: hospital f/u Contact information: [address] [address] [phone]                Discharge Exam: Filed Weights   12/10/21 0857  Weight: 61.2 kg     Condition at discharge: fair  The results of significant diagnostics from this hospitalization (including imaging, microbiology, ancillary and laboratory) are listed below for reference.   Imaging Studies: DG Chest Port 1 View  Result Date: 12/12/2021 CLINICAL DATA:  Dyspnea EXAM: PORTABLE CHEST 1 VIEW COMPARISON:  12/10/2021 FINDINGS: The lungs are hyperinflated in keeping with changes of underlying COPD, stable since prior examination. Superimposed peripheral bibasilar interstitial opacities are present most in keeping with mild interstitial pulmonary edema. This appears stable since prior examination. No pneumothorax or pleural effusion. Coronary artery bypass grafting has been performed. Cardiac size is within normal limits. Left subclavian pacemaker defibrillator is unchanged. No acute bone abnormality. IMPRESSION: 1. Stable mild interstitial pulmonary edema. 2. COPD. Electronically Signed   By: Fidela Salisbury M.D.   On: 12/12/2021  02:23   ECHOCARDIOGRAM COMPLETE  Result Date: 12/11/2021    ECHOCARDIOGRAM REPORT   Patient Name:   ITA FRITZSCHE Date of Exam: 12/10/2021 Medical Rec #:  [id number]   Height: Accession #:    [id number]  Weight: Date of Birth:  02/10/1941   BSA: Patient Age:    81 years    BP:           83/61 mmHg Patient Gender: F           HR:           79 bpm. Exam Location:  [facility] Procedure: 2D Echo, Cardiac Doppler and Color Doppler Indications:     Acute respiratory distress R06.03  History:         Patient has no prior history of Echocardiogram examinations.                  CHF, CAD and Previous Myocardial Infarction, Prior CABG,                  Pacemaker and Endarterectomy, COPD, Arrythmias:LBBB; Risk                  Factors:Current Smoker, Diabetes, Hypertension and                  Dyslipidemia.  Sonographer:     Rosalia Hammers Referring Phys:  [facility] Diagnosing Phys: Yolonda Kida MD  Sonographer Comments: Image quality was good. IMPRESSIONS  1. Left ventricular ejection fraction, by estimation, is 70 to 75%. The left ventricle has hyperdynamic function. The left ventricle has no regional wall motion abnormalities. There is mild left ventricular hypertrophy. Left ventricular diastolic parameters are consistent with Grade II diastolic dysfunction (pseudonormalization).  2. Right ventricular systolic function is normal. The right ventricular size is normal.  3. The mitral valve is normal in structure. Trivial mitral valve regurgitation.  4. The aortic valve is normal in structure. Aortic valve regurgitation is trivial. FINDINGS  Left Ventricle: Left ventricular ejection fraction, by estimation, is 70 to 75%. The left ventricle has hyperdynamic function. The left ventricle has no regional wall motion abnormalities. The left ventricular internal cavity size was normal in size. There is mild left ventricular hypertrophy. Left ventricular diastolic parameters are consistent with Grade II diastolic dysfunction  (  pseudonormalization). Right Ventricle: The right ventricular size is normal. No increase in right ventricular wall thickness. Right ventricular systolic function is normal. Left Atrium: Left atrial size was normal in size. Right Atrium: Right atrial size was normal in size. Pericardium: There is no evidence of pericardial effusion. Mitral Valve: The mitral valve is normal in structure. Trivial mitral valve regurgitation. Tricuspid Valve: The tricuspid valve is normal in structure. Tricuspid valve regurgitation is mild. Aortic Valve: The aortic valve is normal in structure. Aortic valve regurgitation is trivial. Aortic valve mean gradient measures 7.0 mmHg. Aortic valve peak gradient measures 12.8 mmHg. Aortic valve area, by VTI measures 1.29 cm. Pulmonic Valve: The pulmonic valve was normal in structure. Pulmonic valve regurgitation is not visualized. Aorta: The ascending aorta was not well visualized. IAS/Shunts: No atrial level shunt detected by color flow Doppler.  LEFT VENTRICLE PLAX 2D LVIDd:         4.35 cm   Diastology LVIDs:         2.62 cm   LV e' medial:    4.35 cm/s LV PW:         1.40 cm   LV E/e' medial:  37.7 LV IVS:        1.28 cm   LV e' lateral:   4.90 cm/s LVOT diam:     1.60 cm   LV E/e' lateral: 33.5 LV SV:         49 LVOT Area:     2.01 cm  RIGHT VENTRICLE RV Basal diam:  2.82 cm RV Mid diam:    3.12 cm RV S prime:     13.20 cm/s TAPSE (M-mode): 2.3 cm LEFT ATRIUM             RIGHT ATRIUM LA diam:        3.50 cm RA Area:     16.00 cm LA Vol (A2C):   43.5 ml RA Volume:   41.50 ml LA Vol (A4C):   38.7 ml LA Biplane Vol: 41.8 ml  AORTIC VALVE AV Area (Vmax):    1.21 cm AV Area (Vmean):   1.22 cm AV Area (VTI):     1.29 cm AV Vmax:           179.00 cm/s AV Vmean:          129.000 cm/s AV VTI:            0.381 m AV Peak Grad:      12.8 mmHg AV Mean Grad:      7.0 mmHg LVOT Vmax:         108.00 cm/s LVOT Vmean:        78.400 cm/s LVOT VTI:          0.244 m LVOT/AV VTI ratio: 0.64  AORTA Ao Root  diam: 2.70 cm MITRAL VALVE                TRICUSPID VALVE MV Area (PHT): 3.42 cm     TR Peak grad:   49.0 mmHg MV Decel Time: 222 msec     TR Vmax:        350.00 cm/s MV E velocity: 164.00 cm/s MV A velocity: 160.00 cm/s  SHUNTS MV E/A ratio:  1.02         Systemic VTI:  0.24 m                             Systemic Diam: 1.60 cm  Yolonda Kida MD Electronically signed by Yolonda Kida MD Signature Date/Time: 12/11/2021/5:30:23 PM    Final    DG Chest 2 View  Result Date: 12/10/2021 CLINICAL DATA:  Shortness of breath for 1 day. EXAM: CHEST - 2 VIEW COMPARISON:  07/21/2020 FINDINGS: UPPER limits normal heart size, CABG changes and LEFT-sided pacemaker/AICD again noted. Mild interstitial opacities and trace bilateral pleural effusions are noted. There is no evidence of pneumothorax or acute bony abnormality. IMPRESSION: Mild interstitial opacities and trace bilateral pleural, favor mild pulmonary edema. Electronically Signed   By: Margarette [place] M.D.   On: 12/10/2021 09:29    Microbiology: Results for orders placed or performed during the hospital encounter of 05/08/21  MRSA Next Gen by PCR, Nasal     Status: None   Collection Time: 05/08/21 11:43 AM   Specimen: Nasal Mucosa; Nasal Swab  Result Value Ref Range Status   MRSA by PCR Next Gen NOT DETECTED NOT DETECTED Final    Comment: (NOTE) The GeneXpert MRSA Assay (FDA approved for NASAL specimens only), is one component of a comprehensive MRSA colonization surveillance program. It is not intended to diagnose MRSA infection nor to guide or monitor treatment for MRSA infections. Test performance is not FDA approved in patients less than 70 years old. Performed at [facility], [address] [place], [place] [address]     Labs: CBC: Recent Labs  Lab 12/10/21 [phone] 12/12/21 1237  WBC 10.3 10.6*  NEUTROABS 8.1*  --   HGB 11.8* 10.9*  HCT 37.6 34.1*  MCV 96.4 96.9  PLT 166 740   Basic Metabolic Panel: Recent Labs  Lab  12/10/21 0917 12/12/21 0522 12/12/21 1105 12/13/21 0817  NA 145 144  --  143  K 3.1* 3.9  --  3.6  CL 110 108  --  104  CO2 27 28  --  33*  GLUCOSE 146* 183*  --  99  BUN 10 22  --  26*  CREATININE 0.92 1.05*  --  0.94  CALCIUM 9.2 9.7  --  9.4  MG  --   --  2.2  --   PHOS  --   --  2.6  --    Liver Function Tests: No results for input(s): "AST", "ALT", "ALKPHOS", "BILITOT", "PROT", "ALBUMIN" in the last 168 hours. CBG: Recent Labs  Lab 12/10/21 1610  GLUCAP 239*    Discharge time spent: greater than 30 minutes.  Signed: Fritzi Mandes, MD [facility] 12/13/2021

## 2021-12-23 ENCOUNTER — Inpatient Hospital Stay
Admission: EM | Admit: 2021-12-23 | Discharge: 2021-12-26 | DRG: 291 | Disposition: A | Discharge status: Home Health | Payer: Medicare Other | Attending: Internal Medicine | Admitting: Internal Medicine

## 2021-12-23 ENCOUNTER — Other Ambulatory Visit: Payer: Self-pay

## 2021-12-23 ENCOUNTER — Emergency Department: Payer: Medicare Other

## 2021-12-23 DIAGNOSIS — Z9049 Acquired absence of other specified parts of digestive tract: Secondary | ICD-10-CM

## 2021-12-23 DIAGNOSIS — M79675 Pain in left toe(s): Secondary | ICD-10-CM | POA: Diagnosis present

## 2021-12-23 DIAGNOSIS — Z8249 Family history of ischemic heart disease and other diseases of the circulatory system: Secondary | ICD-10-CM

## 2021-12-23 DIAGNOSIS — Z881 Allergy status to other antibiotic agents status: Secondary | ICD-10-CM

## 2021-12-23 DIAGNOSIS — E785 Hyperlipidemia, unspecified: Secondary | ICD-10-CM | POA: Diagnosis present

## 2021-12-23 DIAGNOSIS — R7989 Other specified abnormal findings of blood chemistry: Secondary | ICD-10-CM

## 2021-12-23 DIAGNOSIS — R778 Other specified abnormalities of plasma proteins: Secondary | ICD-10-CM

## 2021-12-23 DIAGNOSIS — I130 Hypertensive heart and chronic kidney disease with heart failure and stage 1 through stage 4 chronic kidney disease, or unspecified chronic kidney disease: Principal | ICD-10-CM | POA: Diagnosis present

## 2021-12-23 DIAGNOSIS — E876 Hypokalemia: Secondary | ICD-10-CM | POA: Diagnosis present

## 2021-12-23 DIAGNOSIS — I252 Old myocardial infarction: Secondary | ICD-10-CM

## 2021-12-23 DIAGNOSIS — I480 Paroxysmal atrial fibrillation: Secondary | ICD-10-CM | POA: Diagnosis present

## 2021-12-23 DIAGNOSIS — E039 Hypothyroidism, unspecified: Secondary | ICD-10-CM | POA: Diagnosis present

## 2021-12-23 DIAGNOSIS — K219 Gastro-esophageal reflux disease without esophagitis: Secondary | ICD-10-CM | POA: Diagnosis present

## 2021-12-23 DIAGNOSIS — Z8541 Personal history of malignant neoplasm of cervix uteri: Secondary | ICD-10-CM | POA: Diagnosis not present

## 2021-12-23 DIAGNOSIS — I4891 Unspecified atrial fibrillation: Secondary | ICD-10-CM | POA: Diagnosis present

## 2021-12-23 DIAGNOSIS — Z7901 Long term (current) use of anticoagulants: Secondary | ICD-10-CM

## 2021-12-23 DIAGNOSIS — E1122 Type 2 diabetes mellitus with diabetic chronic kidney disease: Secondary | ICD-10-CM | POA: Diagnosis present

## 2021-12-23 DIAGNOSIS — N183 Chronic kidney disease, stage 3 unspecified: Secondary | ICD-10-CM | POA: Diagnosis present

## 2021-12-23 DIAGNOSIS — N179 Acute kidney failure, unspecified: Secondary | ICD-10-CM

## 2021-12-23 DIAGNOSIS — I5023 Acute on chronic systolic (congestive) heart failure: Secondary | ICD-10-CM | POA: Diagnosis present

## 2021-12-23 DIAGNOSIS — I2510 Atherosclerotic heart disease of native coronary artery without angina pectoris: Secondary | ICD-10-CM | POA: Diagnosis present

## 2021-12-23 DIAGNOSIS — I509 Heart failure, unspecified: Secondary | ICD-10-CM

## 2021-12-23 DIAGNOSIS — Z951 Presence of aortocoronary bypass graft: Secondary | ICD-10-CM | POA: Diagnosis not present

## 2021-12-23 DIAGNOSIS — J449 Chronic obstructive pulmonary disease, unspecified: Secondary | ICD-10-CM | POA: Diagnosis present

## 2021-12-23 DIAGNOSIS — K5900 Constipation, unspecified: Secondary | ICD-10-CM | POA: Diagnosis present

## 2021-12-23 DIAGNOSIS — Z95810 Presence of automatic (implantable) cardiac defibrillator: Secondary | ICD-10-CM | POA: Diagnosis not present

## 2021-12-23 DIAGNOSIS — E860 Dehydration: Secondary | ICD-10-CM | POA: Diagnosis present

## 2021-12-23 DIAGNOSIS — I447 Left bundle-branch block, unspecified: Secondary | ICD-10-CM | POA: Diagnosis present

## 2021-12-23 DIAGNOSIS — I10 Essential (primary) hypertension: Secondary | ICD-10-CM | POA: Diagnosis present

## 2021-12-23 DIAGNOSIS — I248 Other forms of acute ischemic heart disease: Secondary | ICD-10-CM | POA: Diagnosis present

## 2021-12-23 DIAGNOSIS — Z79899 Other long term (current) drug therapy: Secondary | ICD-10-CM

## 2021-12-23 DIAGNOSIS — F32A Depression, unspecified: Secondary | ICD-10-CM | POA: Diagnosis present

## 2021-12-23 DIAGNOSIS — Z955 Presence of coronary angioplasty implant and graft: Secondary | ICD-10-CM | POA: Diagnosis not present

## 2021-12-23 DIAGNOSIS — F419 Anxiety disorder, unspecified: Secondary | ICD-10-CM | POA: Diagnosis present

## 2021-12-23 DIAGNOSIS — Z90711 Acquired absence of uterus with remaining cervical stump: Secondary | ICD-10-CM

## 2021-12-23 DIAGNOSIS — R54 Age-related physical debility: Secondary | ICD-10-CM | POA: Diagnosis present

## 2021-12-23 DIAGNOSIS — Z87442 Personal history of urinary calculi: Secondary | ICD-10-CM | POA: Diagnosis not present

## 2021-12-23 DIAGNOSIS — Z7951 Long term (current) use of inhaled steroids: Secondary | ICD-10-CM

## 2021-12-23 DIAGNOSIS — Z7984 Long term (current) use of oral hypoglycemic drugs: Secondary | ICD-10-CM

## 2021-12-23 DIAGNOSIS — F17210 Nicotine dependence, cigarettes, uncomplicated: Secondary | ICD-10-CM | POA: Diagnosis present

## 2021-12-23 DIAGNOSIS — Z20822 Contact with and (suspected) exposure to covid-19: Secondary | ICD-10-CM | POA: Diagnosis present

## 2021-12-23 DIAGNOSIS — E1169 Type 2 diabetes mellitus with other specified complication: Secondary | ICD-10-CM

## 2021-12-23 DIAGNOSIS — Z888 Allergy status to other drugs, medicaments and biological substances status: Secondary | ICD-10-CM

## 2021-12-23 DIAGNOSIS — E1136 Type 2 diabetes mellitus with diabetic cataract: Secondary | ICD-10-CM | POA: Diagnosis present

## 2021-12-23 DIAGNOSIS — M79674 Pain in right toe(s): Secondary | ICD-10-CM | POA: Diagnosis present

## 2021-12-23 DIAGNOSIS — E119 Type 2 diabetes mellitus without complications: Secondary | ICD-10-CM

## 2021-12-23 DIAGNOSIS — Z79890 Hormone replacement therapy (postmenopausal): Secondary | ICD-10-CM

## 2021-12-23 DIAGNOSIS — Z833 Family history of diabetes mellitus: Secondary | ICD-10-CM

## 2021-12-23 DIAGNOSIS — Z7982 Long term (current) use of aspirin: Secondary | ICD-10-CM

## 2021-12-23 LAB — CBC WITH DIFFERENTIAL/PLATELET
Abs Immature Granulocytes: 0.06 10*3/uL (ref 0.00–0.07)
Basophils Absolute: 0 10*3/uL (ref 0.0–0.1)
Basophils Relative: 0 %
Eosinophils Absolute: 0 10*3/uL (ref 0.0–0.5)
Eosinophils Relative: 0 %
HCT: 38.9 % (ref 36.0–46.0)
Hemoglobin: 12.1 g/dL (ref 12.0–15.0)
Immature Granulocytes: 1 %
Lymphocytes Relative: 12 %
Lymphs Abs: 1.5 10*3/uL (ref 0.7–4.0)
MCH: 30.6 pg (ref 26.0–34.0)
MCHC: 31.1 g/dL (ref 30.0–36.0)
MCV: 98.2 fL (ref 80.0–100.0)
Monocytes Absolute: 0.6 10*3/uL (ref 0.1–1.0)
Monocytes Relative: 5 %
Neutro Abs: 10.1 10*3/uL — ABNORMAL HIGH (ref 1.7–7.7)
Neutrophils Relative %: 82 %
Platelets: 238 10*3/uL (ref 150–400)
RBC: 3.96 MIL/uL (ref 3.87–5.11)
RDW: 15.8 % — ABNORMAL HIGH (ref 11.5–15.5)
WBC: 12.2 10*3/uL — ABNORMAL HIGH (ref 4.0–10.5)
nRBC: 0.2 % (ref 0.0–0.2)

## 2021-12-23 LAB — CBG MONITORING, ED: Glucose-Capillary: 116 mg/dL — ABNORMAL HIGH (ref 70–99)

## 2021-12-23 LAB — MAGNESIUM: Magnesium: 2.2 mg/dL (ref 1.7–2.4)

## 2021-12-23 LAB — COMPREHENSIVE METABOLIC PANEL
ALT: 41 U/L (ref 0–44)
AST: 26 U/L (ref 15–41)
Albumin: 3.7 g/dL (ref 3.5–5.0)
Alkaline Phosphatase: 53 U/L (ref 38–126)
Anion gap: 11 (ref 5–15)
BUN: 27 mg/dL — ABNORMAL HIGH (ref 8–23)
CO2: 27 mmol/L (ref 22–32)
Calcium: 9.5 mg/dL (ref 8.9–10.3)
Chloride: 101 mmol/L (ref 98–111)
Creatinine, Ser: 1.45 mg/dL — ABNORMAL HIGH (ref 0.44–1.00)
GFR, Estimated: 36 mL/min — ABNORMAL LOW (ref >60)
Glucose, Bld: 152 mg/dL — ABNORMAL HIGH (ref 70–99)
Potassium: 3.6 mmol/L (ref 3.5–5.1)
Sodium: 139 mmol/L (ref 135–145)
Total Bilirubin: 1 mg/dL (ref 0.3–1.2)
Total Protein: 6.7 g/dL (ref 6.5–8.1)

## 2021-12-23 LAB — PROCALCITONIN: Procalcitonin: <0.1 ng/mL

## 2021-12-23 LAB — BRAIN NATRIURETIC PEPTIDE: B Natriuretic Peptide: 2319.7 pg/mL — ABNORMAL HIGH (ref 0.0–100.0)

## 2021-12-23 LAB — TSH: TSH: 4.923 u[IU]/mL — ABNORMAL HIGH (ref 0.350–4.500)

## 2021-12-23 LAB — LIPASE, BLOOD: Lipase: 24 U/L (ref 11–51)

## 2021-12-23 LAB — T4, FREE: Free T4: 0.89 ng/dL (ref 0.61–1.12)

## 2021-12-23 LAB — SARS CORONAVIRUS 2 BY RT PCR: SARS Coronavirus 2 by RT PCR: NEGATIVE

## 2021-12-23 LAB — TROPONIN I (HIGH SENSITIVITY)
Troponin I (High Sensitivity): 35 ng/L — ABNORMAL HIGH (ref <18)
Troponin I (High Sensitivity): 38 ng/L — ABNORMAL HIGH (ref <18)

## 2021-12-23 MED ORDER — ASPIRIN 81 MG PO TBEC
81.0000 mg | DELAYED_RELEASE_TABLET | Freq: Every day | ORAL | Status: DC
  Administered 2021-12-24 – 2021-12-26 (×3): 81 mg via ORAL
  Filled 2021-12-23 (×3): qty 1

## 2021-12-23 MED ORDER — ONDANSETRON HCL 4 MG/2ML IJ SOLN: 4.0000 mg | Freq: Four times a day (QID) | INTRAMUSCULAR | Status: DC | PRN

## 2021-12-23 MED ORDER — ACETAMINOPHEN 325 MG PO TABS
650.0000 mg | ORAL_TABLET | ORAL | Status: DC | PRN
  Administered 2021-12-24: 650 mg via ORAL
  Filled 2021-12-23: qty 2

## 2021-12-23 MED ORDER — FUROSEMIDE 10 MG/ML IJ SOLN
40.0000 mg | Freq: Two times a day (BID) | INTRAMUSCULAR | Status: DC
  Administered 2021-12-24: 40 mg via INTRAVENOUS
  Filled 2021-12-23: qty 4

## 2021-12-23 MED ORDER — FUROSEMIDE 10 MG/ML IJ SOLN
40.0000 mg | Freq: Once | INTRAMUSCULAR | Status: AC
  Administered 2021-12-23: 40 mg via INTRAVENOUS
  Filled 2021-12-23: qty 4

## 2021-12-23 MED ORDER — ALBUTEROL SULFATE HFA 108 (90 BASE) MCG/ACT IN AERS: 1.0000 | INHALATION_SPRAY | RESPIRATORY_TRACT | Status: DC | PRN

## 2021-12-23 MED ORDER — APIXABAN 5 MG PO TABS
5.0000 mg | ORAL_TABLET | Freq: Two times a day (BID) | ORAL | Status: DC
  Administered 2021-12-24 – 2021-12-26 (×5): 5 mg via ORAL
  Filled 2021-12-23 (×5): qty 1

## 2021-12-23 MED ORDER — METOPROLOL TARTRATE 25 MG PO TABS: 25.0000 mg | ORAL_TABLET | Freq: Two times a day (BID) | ORAL | Status: DC

## 2021-12-23 MED ORDER — INSULIN ASPART 100 UNIT/ML IJ SOLN: 0.0000 [IU] | Freq: Every day | INTRAMUSCULAR | Status: DC

## 2021-12-23 MED ORDER — ICOSAPENT ETHYL 1 G PO CAPS
1.0000 g | ORAL_CAPSULE | Freq: Two times a day (BID) | ORAL | Status: DC
  Administered 2021-12-24 – 2021-12-26 (×5): 1 g via ORAL
  Filled 2021-12-23 (×6): qty 1

## 2021-12-23 MED ORDER — DILTIAZEM HCL 25 MG/5ML IV SOLN
10.0000 mg | Freq: Once | INTRAVENOUS | Status: AC
  Administered 2021-12-23: 10 mg via INTRAVENOUS
  Filled 2021-12-23: qty 5

## 2021-12-23 MED ORDER — INSULIN ASPART 100 UNIT/ML IJ SOLN
0.0000 [IU] | Freq: Three times a day (TID) | INTRAMUSCULAR | Status: DC
  Administered 2021-12-24: 2 [IU] via SUBCUTANEOUS
  Administered 2021-12-25: 3 [IU] via SUBCUTANEOUS
  Administered 2021-12-25: 2 [IU] via SUBCUTANEOUS
  Administered 2021-12-26: 3 [IU] via SUBCUTANEOUS
  Administered 2021-12-26: 2 [IU] via SUBCUTANEOUS
  Filled 2021-12-23 (×5): qty 1

## 2021-12-23 MED ORDER — LEVOTHYROXINE SODIUM 137 MCG PO TABS
137.0000 ug | ORAL_TABLET | Freq: Every day | ORAL | Status: DC
  Administered 2021-12-24 – 2021-12-26 (×3): 137 ug via ORAL
  Filled 2021-12-23 (×3): qty 1

## 2021-12-23 MED ORDER — ALBUTEROL SULFATE (2.5 MG/3ML) 0.083% IN NEBU
2.5000 mg | INHALATION_SOLUTION | RESPIRATORY_TRACT | Status: DC | PRN
  Administered 2021-12-23: 2.5 mg via RESPIRATORY_TRACT
  Filled 2021-12-23: qty 3

## 2021-12-23 MED ORDER — IOHEXOL 300 MG/ML  SOLN
75.0000 mL | Freq: Once | INTRAMUSCULAR | Status: AC | PRN
  Administered 2021-12-23: 75 mL via INTRAVENOUS

## 2021-12-23 MED ORDER — DILTIAZEM HCL-DEXTROSE 125-5 MG/125ML-% IV SOLN (PREMIX)
5.0000 mg/h | INTRAVENOUS | Status: AC
  Administered 2021-12-23: 5 mg/h via INTRAVENOUS
  Filled 2021-12-23 (×2): qty 125

## 2021-12-23 MED ORDER — BUPROPION HCL ER (SR) 100 MG PO TB12
100.0000 mg | ORAL_TABLET | Freq: Two times a day (BID) | ORAL | Status: DC
  Administered 2021-12-24 – 2021-12-26 (×5): 100 mg via ORAL
  Filled 2021-12-23 (×6): qty 1

## 2021-12-23 MED ORDER — IPRATROPIUM-ALBUTEROL 0.5-2.5 (3) MG/3ML IN SOLN
3.0000 mL | Freq: Two times a day (BID) | RESPIRATORY_TRACT | Status: DC
  Administered 2021-12-23 – 2021-12-26 (×6): 3 mL via RESPIRATORY_TRACT
  Filled 2021-12-23 (×5): qty 3

## 2021-12-23 MED ORDER — EMPAGLIFLOZIN 10 MG PO TABS
10.0000 mg | ORAL_TABLET | Freq: Every day | ORAL | Status: DC
  Filled 2021-12-23: qty 1

## 2021-12-23 MED ORDER — LOSARTAN POTASSIUM 50 MG PO TABS: 25.0000 mg | ORAL_TABLET | Freq: Every day | ORAL | Status: DC

## 2021-12-23 MED ORDER — DILTIAZEM HCL 25 MG/5ML IV SOLN: 15.0000 mg | Freq: Once | INTRAVENOUS | Status: DC

## 2021-12-23 NOTE — Assessment & Plan Note (Signed)
Creatinine 1.45, up from baseline of 0.94.  Likely related to diuretic treatment, dehydration from poor oral intake Monitor renal function and avoid nephrotoxins Monitor for worsening aspiration pneumonia diuretic treatment

## 2021-12-23 NOTE — Assessment & Plan Note (Signed)
S/p AICD Last echo from 12/10/2021 with EF 70 to 75% and G2 DD.  EF in 2020 was 30% IV Lasix and continue home metoprolol and losartan Daily weights with intake and output monitoring

## 2021-12-23 NOTE — Assessment & Plan Note (Signed)
Continue diltiazem infusion started in the emergency room and wean to oral metoprolol Continue Eliquis Cardiology consult

## 2021-12-23 NOTE — Assessment & Plan Note (Signed)
History of CABG 2019 as well as PCI Elevated troponin Troponin in the 30s slightly up from a couple weeks prior in the 20s.  EKG nonacute and patient denies chest pain Suspect demand ischemia related to rapid A-fib Continue to trend Continue aspirin and metoprolol

## 2021-12-23 NOTE — ED Triage Notes (Signed)
Pt. To ED for fatigue, and general malaise x several days. EMS states EKG showed A-fib RVR, with rates between 107-140. Pt. Was discharged from hospital last Thursday for CHF exacerbation. Pt. Is alert and oriented, speaking full sentences in triage.

## 2021-12-23 NOTE — Assessment & Plan Note (Signed)
Continue levothyroxine 

## 2021-12-23 NOTE — ED Provider Notes (Signed)
[facility] Provider Note    Event Date/Time   First MD Initiated Contact with Patient 12/23/21 2001     (approximate)   History   Fatigue (Pt. To ED for fatigue, and general malaise x several days. EMS states EKG showed A-fib RVR, with rates between 107-140. Pt. Was discharged from hospital last Thursday for CHF exacerbation. Pt. Is alert and oriented, speaking full sentences in triage.)   HPI  Christine Obrien is a 81 y.o. female with COPD, CHF, coronary disease, paroxysmal A-fib on chronic anticoagulation with eliquis, diabetes, depression, hypothyroidism who comes in with fatigue.  Patient reports of increasing fatigue over the past several days.  She denies any falls or hitting her head.  She just states that she just does not feel right.  She does report some left lower quadrant abdominal discomfort as well.  She reports that she has been taking all of her medications at home.  I reviewed patient's admission from 8/22 where patient was admitted and placed on antibiotics for possible pneumonia patient was given IV Lasix and IV Solu-Medrol.  Patient has ICD     Physical Exam   Triage Vital Signs: Blood pressure 118/83, pulse (!) 127, temperature 98.2 F (36.8 C), temperature source Oral, resp. rate 17, weight 59.4 kg, SpO2 95 %.  Most recent vital signs: Vitals:   12/23/21 2004  BP: (!) 127/93  Pulse: (!) 110  Resp: 17  Temp: 98.2 F (36.8 C)  SpO2: 93%     General: Awake, no distress.  CV:  Good peripheral perfusion.  Irregular, tachycardic Resp:  Normal effort.  Abd:  No distention.  Tender in the abdomen Other:     ED Results / Procedures / Treatments   Labs (all labs ordered are listed, but only abnormal results are displayed) Labs Reviewed  SARS CORONAVIRUS 2 BY RT PCR  CBC WITH DIFFERENTIAL/PLATELET  COMPREHENSIVE METABOLIC PANEL  BRAIN NATRIURETIC PEPTIDE  PROCALCITONIN  PROCALCITONIN  URINALYSIS, ROUTINE W REFLEX MICROSCOPIC   TSH  T4, FREE  LIPASE, BLOOD  TROPONIN I (HIGH SENSITIVITY)     EKG  My interpretation of EKG:  Atrial fibrillation with a rate of 123 without any ST elevation, T wave inversions in V5 and V6, normal intervals  RADIOLOGY I have reviewed the xray personally and interpreted patient has some edema noted  PROCEDURES:  Critical Care performed: Yes, see critical care procedure note(s)  .Critical Care  Performed by: Vanessa [place], MD Authorized by: Vanessa [place], MD   Critical care provider statement:    Critical care time (minutes):  30   Critical care was necessary to treat or prevent imminent or life-threatening deterioration of the following conditions:  Cardiac failure   Critical care was time spent personally by me on the following activities:  Development of treatment plan with patient or surrogate, discussions with consultants, evaluation of patient's response to treatment, examination of patient, ordering and review of laboratory studies, ordering and review of radiographic studies, ordering and performing treatments and interventions, pulse oximetry, re-evaluation of patient's condition and review of old charts .1-3 Lead EKG Interpretation  Performed by: Vanessa [place], MD Authorized by: Vanessa , MD     Interpretation: abnormal     ECG rate:  120   ECG rate assessment: normal     Rhythm: atrial fibrillation     Ectopy: none     Conduction: normal      MEDICATIONS ORDERED IN ED: Medications  furosemide (  LASIX) injection 40 mg (has no administration in time range)  diltiazem (CARDIZEM) 125 mg in dextrose 5% 125 mL (1 mg/mL) infusion (has no administration in time range)  diltiazem (CARDIZEM) injection 10 mg (has no administration in time range)  diltiazem (CARDIZEM) injection 10 mg (10 mg Intravenous Given 12/23/21 2032)     IMPRESSION / MDM / ASSESSMENT AND PLAN / ED COURSE  I reviewed the triage vital signs and the nursing notes.   Patient's presentation  is most consistent with severe exacerbation of chronic illness.   Patient comes in with atrial fibrillation with RVR feeling fatigue and weakness over the past few days.  Will get CT head to make sure no evidence of intracranial hemorrhage given she is on a blood thinner and CT abdomen given some left lower quadrant pain that she reports has been there for the past few days to make sure no diverticulitis or other acute pathology.  Discussed different options including cardioversion versus rate control and patient prefers to do rate control.  We will give 10 of IV dill to help with tachycardia.  COVID test is negative.  White count is slightly elevated.  CMP shows elevated creatinine.  BNP is double from 13 days ago.  Troponin slightly elevated but similar to prior.  T4 normal.   CT reassuring  Patient's heart rates came down slightly we will give another of 10 of IV Dilt and started on Dilt drip.     Will admit to hospital team-  The patient is on the cardiac monitor to evaluate for evidence of arrhythmia and/or significant heart rate changes.      FINAL CLINICAL IMPRESSION(S) / ED DIAGNOSES   Final diagnoses:  Atrial fibrillation with rapid ventricular response (HCC)  Acute on chronic congestive heart failure, unspecified heart failure type ([place])     Rx / DC Orders   ED Discharge Orders     None        Note:  This document was prepared using Dragon voice recognition software and may include unintentional dictation errors.   Vanessa [place], MD 12/23/21 [phone]

## 2021-12-23 NOTE — Assessment & Plan Note (Signed)
Sliding scale insulin coverage 

## 2021-12-23 NOTE — H&P (Addendum)
History and Physical    Patient: Christine Obrien YQM:[id number] DOB: 02-11-1941 DOA: 12/23/2021 DOS: the patient was seen and examined on 12/23/2021 PCP: [name], MD  Patient coming from: Home  Chief Complaint:  Chief Complaint  Patient presents with   Fatigue    Pt. To ED for fatigue, and general malaise x several days. EMS states EKG showed A-fib RVR, with rates between 107-140. Pt. Was discharged from hospital last Thursday for CHF exacerbation. Pt. Is alert and oriented, speaking full sentences in triage.    HPI: Christine Obrien is a 81 y.o. female with medical history significant for CAD s/p CABG 5284, systolic CHF s/p ICD, with improved EF from 35%, now 75% 12/10/2021, paroxysmal A-fib on Eliquis, LBBB, DM, HLD, carotid artery stenosis s/p right endarterectomy 04/2021, recently hospitalized from 8/22 to 8/25 with respiratory failure secondary to COPD and CHF exacerbations during which time she had multiple rounds of VT/NSVT who now presents to the ED with a several day history of fatigue and generalized malaise with EKG from EMS showing rapid A-fib with rates up to 140.  She denied chest pain but endorses increasing fatigue and dyspnea on exertion.  She denies cough, fever or chills. ED course and data review: Afebrile, heart rate up to 126, BP 127/93, O2 sat 93% on 2 L.  Labs with first troponin 35, BNP 2319, up from 985 a couple weeks prior..  WBC 12,000, creatinine 1.45 up from 0.94 couple weeks prior.  TSH 4.9 with normal free T4, procalcitonin less than 0.10 and COVID-negative EKG, personally viewed and interpreted showing A-fib at 123 with LBBB Chest x-ray shows the following: IMPRESSION: Cardiomegaly with perihilar vascular congestion and mild lower zonal interstitial edema, with small pleural effusions. No acute airspace infiltrate. Similar but interval improved findings on the last study.   CT head and CT abdomen and pelvis were nonacute. Patient was given a diltiazem bolus and  started on a diltiazem infusion given a dose of IV Lasix and hospitalist consulted for admission for rapid A-fib and CHF exacerbation..     Past Medical History:  Diagnosis Date   AICD (automatic cardioverter/defibrillator) present    Anxiety    Asthma    Atherosclerotic peripheral vascular disease with intermittent claudication (HCC)    Cancer (HCC)    cervical   Carotid artery occlusion    CHF (congestive heart failure), NYHA class III (HCC)    CKD (chronic kidney disease), stage III (HCC)    COPD (chronic obstructive pulmonary disease) (HCC)    Coronary artery disease    Current use of long term anticoagulation    Depression    Diabetes mellitus without complication (HCC)    GERD (gastroesophageal reflux disease)    Gout    HLD (hyperlipidemia)    Hx of CABG    Hypertension    Hypothyroidism    Kidney stones    LBBB (left bundle branch block)    Myocardial infarction ([place])    Presence of permanent cardiac pacemaker    Subclavian steal syndrome    Past Surgical History:  Procedure Laterality Date   ABDOMINAL HYSTERECTOMY     partial   CHOLECYSTECTOMY     COLONOSCOPY WITH PROPOFOL N/A 01/11/2020   Procedure: COLONOSCOPY WITH PROPOFOL;  Surgeon: Toledo, Benay Pike, MD;  Location: [facility];  Service: Gastroenterology;  Laterality: N/A;   CORONARY ANGIOPLASTY     CORONARY ARTERY BYPASS GRAFT     double   ENDARTERECTOMY Right 05/08/2021   Procedure:  ENDARTERECTOMY CAROTID;  Surgeon: Algernon Huxley, MD;  Location: [facility] ORS;  Service: Vascular;  Laterality: Right;   EYE SURGERY Bilateral 08/2009   cataracts   HIATAL HERNIA REPAIR  1980s   LEFT HEART CATH AND CORONARY ANGIOGRAPHY N/A 07/03/2017   Procedure: LEFT HEART CATH AND CORONARY ANGIOGRAPHY;  Surgeon: Corey Skains, MD;  Location: [place] [facility];  Service: Cardiovascular;  Laterality: N/A;   LOWER EXTREMITY ANGIOGRAPHY Left 01/11/2018   Procedure: LOWER EXTREMITY ANGIOGRAPHY;  Surgeon: Algernon Huxley, MD;   Location: [place] [facility];  Service: Cardiovascular;  Laterality: Left;   THYROID SURGERY     pt does not remember if removed or partial removal   Social History:  reports that she has been smoking cigarettes. She has been smoking an average of 1 pack per day. She has never used smokeless tobacco. She reports that she does not drink alcohol and does not use drugs.  Allergies  Allergen Reactions   Ace Inhibitors     Other reaction(s): Cough   Atorvastatin     Other reaction(s): Muscle Pain   Statins     Other reaction(s): Muscle Pain Muscle pain   Sulfa Antibiotics     Stomach pains    Family History  Problem Relation Age of Onset   Hypertension Mother    Hypertension Father    Diabetes Sister    Cancer Brother    Diabetes Brother    Breast cancer Neg Hx     Prior to Admission medications   Medication Sig Start Date End Date Taking? Authorizing Provider  Acetaminophen (TYLENOL PO) Take 500 mg by mouth.     [provider]  albuterol (VENTOLIN HFA) 108 (90 Base) MCG/ACT inhaler SMARTSIG:2 Inhalation Via Inhaler Every 4 Hours PRN 01/03/21   [provider]  apixaban (ELIQUIS) 5 MG TABS tablet Take 5 mg by mouth 2 (two) times daily. 03/05/20   [provider]  aspirin EC 81 MG tablet Take 1 tablet (81 mg total) by mouth daily. Swallow whole. 05/09/21   Kris Hartmann, NP  azithromycin (ZITHROMAX) 250 MG tablet Take daily as directed 12/14/21   Fritzi Mandes, MD  buPROPion ER [place] Day SR) 100 MG 12 hr tablet Take 100 mg by mouth 2 (two) times daily. 01/15/21   [provider]  empagliflozin (JARDIANCE) 10 MG TABS tablet Take 10 mg by mouth daily.    [provider]  ezetimibe (ZETIA) 10 MG tablet Take 10 mg by mouth daily. 08/20/17 12/10/21  [provider]  ferrous sulfate 325 (65 FE) MG tablet Take 325 mg by mouth daily with breakfast. 03/09/16   [provider]  fluticasone (FLONASE) 50 MCG/ACT nasal spray Place 2  sprays into the nose daily.    [provider]  fluticasone-salmeterol (ADVAIR) 250-50 MCG/ACT AEPB Inhale 1 puff into the lungs in the morning and at bedtime.    [provider]  furosemide (LASIX) 20 MG tablet Take 1 tablet (20 mg total) by mouth daily. 07/25/20   Fritzi Mandes, MD  gabapentin (NEURONTIN) 100 MG capsule Take 100 mg by mouth 3 (three) times daily. 07/05/21   [provider]  glipiZIDE (GLUCOTROL) 5 MG tablet Take 5 mg by mouth 2 (two) times daily before a meal.    [provider]  icosapent Ethyl (VASCEPA) 1 g capsule Take 1 g by mouth 2 (two) times daily with a meal. 11/28/21   [provider]  ipratropium-albuterol (DUONEB) 0.5-2.5 (3) MG/3ML  SOLN Take 3 mLs by nebulization 2 (two) times daily. 12/13/21   Fritzi Mandes, MD  levothyroxine (SYNTHROID) 137 MCG tablet Take 1 tablet (137 mcg total) by mouth daily. 07/25/20   Fritzi Mandes, MD  loratadine (CLARITIN) 10 MG tablet Take 10 mg by mouth daily. 08/04/18   [provider]  losartan (COZAAR) 25 MG tablet Take 25 mg by mouth daily. 12/02/18 12/10/21  [provider]  metFORMIN (GLUCOPHAGE-XR) 500 MG 24 hr tablet Take 500 mg by mouth 2 (two) times daily. 09/26/21   [provider]  metoprolol tartrate (LOPRESSOR) 25 MG tablet Take 1 tablet (25 mg total) by mouth 2 (two) times daily. 07/04/17   Fritzi Mandes, MD  Multiple Vitamin (MULTIVITAMIN) capsule Take 1 capsule by mouth daily.    [provider]  omeprazole (PRILOSEC) 20 MG capsule Take 20 mg by mouth 2 (two) times daily before a meal. 01/09/16   [provider]  venlafaxine (EFFEXOR) 75 MG tablet Take 75 mg by mouth 2 (two) times daily with a meal.    [provider]    Physical Exam: Vitals:   12/23/21 2150 12/23/21 2200 12/23/21 2210 12/23/21 2212  BP:  124/75    Pulse: (!) 117 (!) 127 (!) 124 (!) 118  Resp: '19 19 20 20  '$ Temp:      TempSrc:      SpO2: 96% 94% 96% 94%  Weight:        Physical Exam Vitals and nursing note reviewed.  Constitutional:      General: She is not in acute distress. HENT:     Head: Normocephalic and atraumatic.  Cardiovascular:     Rate and Rhythm: Tachycardia present. Rhythm irregular.     Heart sounds: Normal heart sounds.  Pulmonary:     Effort: Pulmonary effort is normal.     Breath sounds: Rales present.  Abdominal:     Palpations: Abdomen is soft.     Tenderness: There is no abdominal tenderness.  Musculoskeletal:     Right lower leg: No edema.     Left lower leg: No edema.  Neurological:     Mental Status: Mental status is at baseline.     Labs on Admission: I have personally reviewed following labs and imaging studies  CBC: Recent Labs  Lab 12/23/21 2004  WBC 12.2*  NEUTROABS 10.1*  HGB 12.1  HCT 38.9  MCV 98.2  PLT 811   Basic Metabolic Panel: Recent Labs  Lab 12/23/21 2004  NA 139  K 3.6  CL 101  CO2 27  GLUCOSE 152*  BUN 27*  CREATININE 1.45*  CALCIUM 9.5   GFR: Estimated Creatinine Clearance: 25.6 mL/min (A) (by C-G formula based on SCr of 1.45 mg/dL (H)). Liver Function Tests: Recent Labs  Lab 12/23/21 2004  AST 26  ALT 41  ALKPHOS 53  BILITOT 1.0  PROT 6.7  ALBUMIN 3.7   Recent Labs  Lab 12/23/21 2004  LIPASE 24   No results for input(s): "AMMONIA" in the last 168 hours. Coagulation Profile: No results for input(s): "INR", "PROTIME" in the last 168 hours. Cardiac Enzymes: No results for input(s): "CKTOTAL", "CKMB", "CKMBINDEX", "TROPONINI" in the last 168 hours. BNP (last 3 results) No results for input(s): "PROBNP" in the last 8760 hours. HbA1C: No results for input(s): "HGBA1C" in the last 72 hours. CBG: No results for input(s): "GLUCAP" in the last 168 hours. Lipid Profile: No results for input(s): "CHOL", "HDL", "LDLCALC", "TRIG", "CHOLHDL", "LDLDIRECT" in the last  72 hours. Thyroid Function Tests: Recent Labs    12/23/21 2004  TSH 4.923*  FREET4 0.89   Anemia  Panel: No results for input(s): "VITAMINB12", "FOLATE", "FERRITIN", "TIBC", "IRON", "RETICCTPCT" in the last 72 hours. Urine analysis:    Component Value Date/Time   COLORURINE Straw 03/31/2013 0011   APPEARANCEUR Clear 03/31/2013 0011   LABSPEC 1.005 03/31/2013 0011   PHURINE 8.0 03/31/2013 0011   GLUCOSEU Negative 03/31/2013 0011   HGBUR Negative 03/31/2013 0011   BILIRUBINUR Negative 03/31/2013 0011   KETONESUR Negative 03/31/2013 0011   PROTEINUR Negative 03/31/2013 0011   NITRITE Negative 03/31/2013 0011   LEUKOCYTESUR Negative 03/31/2013 0011    Radiological Exams on Admission: CT HEAD WO CONTRAST (5MM)  Result Date: 12/23/2021 CLINICAL DATA:  Sudden severe headache. General malaise for several days. EKG with atrial fibrillation with RVR. EXAM: CT HEAD WITHOUT CONTRAST TECHNIQUE: Contiguous axial images were obtained from the base of the skull through the vertex without intravenous contrast. RADIATION DOSE REDUCTION: This exam was performed according to the departmental dose-optimization program which includes automated exposure control, adjustment of the mA and/or kV according to patient size and/or use of iterative reconstruction technique. 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 No new asymmetry is seen concerning for an acute infarct, hemorrhage or mass. There is no midline shift. Basal cisterns are clear. Vascular: There are patchy calcifications in the carotid siphons but no hyperdense central vessels. Skull: Negative for fractures or focal lesions. Sinuses/Orbits: No acute findings. No mastoid effusions. Old lens extractions. Other: None. IMPRESSION: No acute intracranial CT findings or interval changes. Chronic change. Electronically Signed   By: Telford Nab M.D.   On: 12/23/2021 21:37   CT  ABDOMEN PELVIS W CONTRAST  Result Date: 12/23/2021 CLINICAL DATA:  Abdominal pain and fatigue.  Generalized malaise. EXAM: CT ABDOMEN AND PELVIS WITH CONTRAST TECHNIQUE: Multidetector CT imaging of the abdomen and pelvis was performed using the standard protocol following bolus administration of intravenous contrast. RADIATION DOSE REDUCTION: This exam was performed according to the departmental dose-optimization program which includes automated exposure control, adjustment of the mA and/or kV according to patient size and/or use of iterative reconstruction technique. CONTRAST:  66m OMNIPAQUE IOHEXOL 300 MG/ML  SOLN COMPARISON:  CT abdomen pelvis dated 06/12/2012. FINDINGS: Lower chest: Partially visualized small bilateral pleural effusions with partial compressive atelectasis of the lower lobes. Pneumonia is not excluded. Cardiac pacemaker wires noted. No intra-abdominal free air.  Trace free fluid within the pelvis. Hepatobiliary: Fatty liver. No biliary dilatation. Cholecystectomy. No retained calcified stone noted in the central CBD. Pancreas: Unremarkable. No pancreatic ductal dilatation or surrounding inflammatory changes. Spleen: Normal in size without focal abnormality. Adrenals/Urinary Tract: Bilateral adrenal thickening/hyperplasia or adenoma similar to prior CT. There is no hydronephrosis on either side. The visualized ureters and urinary bladder appear unremarkable. Stomach/Bowel: There is moderate stool throughout the colon. There is no bowel obstruction or active inflammation. The appendix is not visualized with certainty. No inflammatory changes identified in the right lower quadrant. Vascular/Lymphatic: Advanced aortoiliac atherosclerotic disease. Bilateral common iliac artery stent. The stents remain patent. There is a 3 cm infrarenal abdominal aortic aneurysm. The IVC is unremarkable. No portal venous gas. There is no adenopathy. Reproductive: Hysterectomy.  No adnexal masses. Other: None  Musculoskeletal: No acute or significant osseous findings. IMPRESSION: 1. No acute intra-abdominal or pelvic pathology. 2. Moderate  colonic stool burden. No bowel obstruction. 3. Partially visualized small bilateral pleural effusions with partial compressive atelectasis of the lower lobes. Pneumonia is not excluded. 4. Aortic Atherosclerosis (ICD10-I70.0). Electronically Signed   By: Anner Crete M.D.   On: 12/23/2021 21:34   DG Chest Portable 1 View  Result Date: 12/23/2021 CLINICAL DATA:  Shortness of breath, fatigue and malaise. EXAM: PORTABLE CHEST 1 VIEW COMPARISON:  Portable chest 12/12/21 FINDINGS: Left chest pacemaker/defibrillator and wiring are unchanged in appearance as well as CABG changes. The heart has mildly enlarged compared to the recent study. There is aortic atherosclerosis with stable mediastinum. There is mild perihilar vascular congestion, mild interstitial edema in the lower lung fields with small pleural effusions. The interstitial edema has improved in that it has regressed from the upper lung fields since prior study. No focal pneumonia is seen. In all other respects no further changes. Osteopenia and thoracic spondylosis. IMPRESSION: Cardiomegaly with perihilar vascular congestion and mild lower zonal interstitial edema, with small pleural effusions. No acute airspace infiltrate. Similar but interval improved findings on the last study. Electronically Signed   By: Telford Nab M.D.   On: 12/23/2021 20:20     Data Reviewed: Relevant notes from primary care and specialist visits, past discharge summaries as available in EHR, including Care Everywhere. Prior diagnostic testing as pertinent to current admission diagnoses Updated medications and problem lists for reconciliation ED course, including vitals, labs, imaging, treatment and response to treatment Triage notes, nursing and pharmacy notes and ED provider's notes Notable results as noted in HPI   Assessment and  Plan: * Rapid atrial fibrillation (HCC) Continue diltiazem infusion started in the emergency room and wean to oral metoprolol Continue Eliquis Cardiology consult  Acute on chronic systolic CHF with improved EF (congestive heart failure) ([place]) S/p AICD Last echo from 12/10/2021 with EF 70 to 75% and G2 DD.  EF in 2020 was 30% IV Lasix and continue home metoprolol and losartan Daily weights with intake and output monitoring  AKI (acute kidney injury) ([place]) Creatinine 1.45, up from baseline of 0.94.  Likely related to diuretic treatment, dehydration from poor oral intake Monitor renal function and avoid nephrotoxins Monitor for worsening aspiration pneumonia diuretic treatment  Type 2 diabetes mellitus without complications (HCC) Sliding scale insulin coverage  Hypothyroidism Continue levothyroxine  Essential hypertension Continue metoprolol and losartan as BP will tolerate  Coronary artery disease involving native heart History of CABG 2019 as well as PCI Elevated troponin Troponin in the 30s slightly up from a couple weeks prior in the 20s.  EKG nonacute and patient denies chest pain Suspect demand ischemia related to rapid A-fib Continue to trend Continue aspirin and metoprolol        DVT prophylaxis: Apixaban  Consults: [facility] cardiology Dr. Clayborn Bigness  Advance Care Planning:   Code Status: Prior   Family Communication: daughter at bedside   Disposition Plan: Back to previous home environment  Severity of Illness: The appropriate patient status for this patient is INPATIENT. Inpatient status is judged to be reasonable and necessary in order to provide the required intensity of service to ensure the patient's safety. The patient's presenting symptoms, physical exam findings, and initial radiographic and laboratory data in the context of their chronic comorbidities is felt to place them at high risk for further clinical deterioration. Furthermore, it is not anticipated  that the patient will be medically stable for discharge from the hospital within 2 midnights of admission.   * I certify that at the  point of admission it is my clinical judgment that the patient will require inpatient hospital care spanning beyond 2 midnights from the point of admission due to high intensity of service, high risk for further deterioration and high frequency of surveillance required.*  Author: Athena Masse, MD 12/23/2021 10:22 PM  For on call review [email or web address].

## 2021-12-23 NOTE — Assessment & Plan Note (Signed)
Continue metoprolol and losartan as BP will tolerate

## 2021-12-24 DIAGNOSIS — I4891 Unspecified atrial fibrillation: Secondary | ICD-10-CM | POA: Diagnosis not present

## 2021-12-24 LAB — GLUCOSE, CAPILLARY
Glucose-Capillary: 158 mg/dL — ABNORMAL HIGH (ref 70–99)
Glucose-Capillary: 133 mg/dL — ABNORMAL HIGH (ref 70–99)
Glucose-Capillary: 121 mg/dL — ABNORMAL HIGH (ref 70–99)

## 2021-12-24 LAB — MAGNESIUM: Magnesium: 2 mg/dL (ref 1.7–2.4)

## 2021-12-24 LAB — URINALYSIS, ROUTINE W REFLEX MICROSCOPIC
Bacteria, UA: NONE SEEN
Bilirubin Urine: NEGATIVE
Glucose, UA: >=500 mg/dL — AB
Hgb urine dipstick: NEGATIVE
Ketones, ur: NEGATIVE mg/dL
Leukocytes,Ua: NEGATIVE
Nitrite: NEGATIVE
Protein, ur: NEGATIVE mg/dL
Specific Gravity, Urine: 1.015 (ref 1.005–1.030)
pH: 5 (ref 5.0–8.0)

## 2021-12-24 LAB — CBC
HCT: 36 % (ref 36.0–46.0)
Hemoglobin: 11.3 g/dL — ABNORMAL LOW (ref 12.0–15.0)
MCH: 30.4 pg (ref 26.0–34.0)
MCHC: 31.4 g/dL (ref 30.0–36.0)
MCV: 96.8 fL (ref 80.0–100.0)
Platelets: 199 10*3/uL (ref 150–400)
RBC: 3.72 MIL/uL — ABNORMAL LOW (ref 3.87–5.11)
RDW: 15.7 % — ABNORMAL HIGH (ref 11.5–15.5)
WBC: 12.7 10*3/uL — ABNORMAL HIGH (ref 4.0–10.5)
nRBC: 0 % (ref 0.0–0.2)

## 2021-12-24 LAB — BASIC METABOLIC PANEL
Anion gap: 13 (ref 5–15)
BUN: 25 mg/dL — ABNORMAL HIGH (ref 8–23)
CO2: 29 mmol/L (ref 22–32)
Calcium: 9.3 mg/dL (ref 8.9–10.3)
Chloride: 99 mmol/L (ref 98–111)
Creatinine, Ser: 1.34 mg/dL — ABNORMAL HIGH (ref 0.44–1.00)
GFR, Estimated: 40 mL/min — ABNORMAL LOW (ref >60)
Glucose, Bld: 118 mg/dL — ABNORMAL HIGH (ref 70–99)
Potassium: 2.9 mmol/L — ABNORMAL LOW (ref 3.5–5.1)
Sodium: 141 mmol/L (ref 135–145)

## 2021-12-24 LAB — CBG MONITORING, ED
Glucose-Capillary: 121 mg/dL — ABNORMAL HIGH (ref 70–99)
Glucose-Capillary: 159 mg/dL — ABNORMAL HIGH (ref 70–99)

## 2021-12-24 LAB — HEMOGLOBIN A1C
Hgb A1c MFr Bld: 6.1 % — ABNORMAL HIGH (ref 4.8–5.6)
Mean Plasma Glucose: 128.37 mg/dL

## 2021-12-24 LAB — URIC ACID: Uric Acid, Serum: 10.4 mg/dL — ABNORMAL HIGH (ref 2.5–7.1)

## 2021-12-24 LAB — PROCALCITONIN: Procalcitonin: <0.1 ng/mL

## 2021-12-24 MED ORDER — MORPHINE SULFATE (PF) 2 MG/ML IV SOLN
2.0000 mg | Freq: Once | INTRAVENOUS | Status: AC
  Administered 2021-12-24: 2 mg via INTRAVENOUS
  Filled 2021-12-24: qty 1

## 2021-12-24 MED ORDER — ACETAMINOPHEN 500 MG PO TABS: 500.0000 mg | ORAL_TABLET | Freq: Once | ORAL | Status: DC

## 2021-12-24 MED ORDER — LIDOCAINE 5 % EX PTCH
1.0000 | MEDICATED_PATCH | CUTANEOUS | Status: DC
  Administered 2021-12-24 – 2021-12-26 (×3): 1 via TRANSDERMAL
  Filled 2021-12-24 (×3): qty 1

## 2021-12-24 MED ORDER — POTASSIUM CHLORIDE CRYS ER 20 MEQ PO TBCR
40.0000 meq | EXTENDED_RELEASE_TABLET | Freq: Four times a day (QID) | ORAL | Status: AC
  Administered 2021-12-24 (×2): 40 meq via ORAL
  Filled 2021-12-24 (×2): qty 2

## 2021-12-24 MED ORDER — METOPROLOL TARTRATE 25 MG PO TABS
25.0000 mg | ORAL_TABLET | Freq: Three times a day (TID) | ORAL | Status: DC
  Administered 2021-12-24 – 2021-12-25 (×3): 25 mg via ORAL
  Filled 2021-12-24 (×4): qty 1

## 2021-12-24 MED ORDER — DILTIAZEM HCL 60 MG PO TABS: 30.0000 mg | ORAL_TABLET | Freq: Four times a day (QID) | ORAL | Status: DC

## 2021-12-24 MED ORDER — METOPROLOL TARTRATE 50 MG PO TABS: 50.0000 mg | ORAL_TABLET | Freq: Two times a day (BID) | ORAL | Status: DC

## 2021-12-24 NOTE — TOC Initial Note (Signed)
Transition of Care [facility]) - Initial/Assessment Note    Patient Details  Name: Christine Obrien MRN: [id number] Date of Birth: 24-Jan-1941  Transition of Care [facility]) CM/SW Contact:    Ninfa Meeker, RN Phone Number: 12/24/2021, 3:39 PM  Clinical Narrative:                 Transition of Care Screening Note:  Transition of Care Department [facility]) has reviewed patient and no TOC needs have been identified at this time. We will continue to monitor patient advancement through Interdisciplinary progressions. If new patient transition needs arise, please place a consult.          Patient Goals and CMS Choice        Expected Discharge Plan and Services                                                Prior Living Arrangements/Services                       Activities of Daily Living Home Assistive Devices/Equipment: Environmental consultant (specify type), Eyeglasses ADL Screening (condition at time of admission) Patient's cognitive ability adequate to safely complete daily activities?: Yes Is the patient deaf or have difficulty hearing?: No Does the patient have difficulty seeing, even when wearing glasses/contacts?: No Does the patient have difficulty concentrating, remembering, or making decisions?: No Patient able to express need for assistance with ADLs?: Yes Does the patient have difficulty dressing or bathing?: No Independently performs ADLs?: Yes (appropriate for developmental age) Does the patient have difficulty walking or climbing stairs?: Yes Weakness of Legs: Both Weakness of Arms/Hands: Both  Permission Sought/Granted                  Emotional Assessment              Admission diagnosis:  Rapid atrial fibrillation ([place]) [I48.91] Atrial fibrillation with rapid ventricular response ([place]) [I48.91] Acute on chronic congestive heart failure, unspecified heart failure type [facility]) [I50.9] Patient Active Problem List   Diagnosis Date Noted   Rapid atrial  fibrillation ([place]) 12/23/2021   Elevated troponin 12/23/2021   AKI (acute kidney injury) ([place]) 12/23/2021   Acute on chronic respiratory failure with hypoxia (HCC)    COPD exacerbation (HCC)    PAF (paroxysmal atrial fibrillation) ([place])    Acute hypoxemic respiratory failure ([place]) 12/10/2021   Carotid stenosis, right 05/08/2021   Pain in limb 04/23/2021   Acute CHF (congestive heart failure) ([place]) 48/54/6270   Acute systolic CHF (congestive heart failure) ([place]) 07/21/2020   CKD stage 3 due to type 2 diabetes mellitus ([place]) 07/21/2020   Nicotine dependence 07/21/2020   ICD (implantable cardioverter-defibrillator) in place 11/02/2019   Acute on chronic systolic CHF with improved EF (congestive heart failure) ([place]) 02/14/2019   LBBB (left bundle branch block) 12/02/2018   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   Subclavian steal syndrome 01/12/2018   Atherosclerotic peripheral vascular disease with intermittent claudication ([place]) 12/30/2017   Pain in both upper extremities 12/30/2017   Hyperlipidemia 12/30/2017   Coronary artery disease involving native heart 12/30/2017   Postoperative atrial fibrillation ([place]) 07/09/2017   S/P CABG x 2 07/09/2017   Stenosis of left subclavian artery ([place]) 07/07/2017   History of non-ST elevation myocardial infarction (NSTEMI) 07/06/2017   Presence of stent  in coronary artery 07/06/2017   Chest pain 07/03/2017   NSTEMI (non-ST elevated myocardial infarction) ([place]) 07/03/2017   Impingement syndrome of right shoulder region 03/06/2017   Diabetes ([place]) 03/25/2016   Carotid stenosis 03/25/2016   Anxiety, generalized 08/24/2015   Gastroesophageal reflux disease without esophagitis 07/13/2015   DDD (degenerative disc disease), cervical 04/06/2015   Cervical radiculitis 04/06/2015   Type 2 diabetes mellitus without complications ([place]) 51/76/1607   Carotid bruit 09/15/2011   Symptoms involving cardiovascular system 09/15/2011    Essential hypertension 09/11/2011   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 [place] [address] Phone: [phone] Fax: [phone]     Social Determinants of Health (SDOH) Interventions    Readmission Risk Interventions     No data to display

## 2021-12-24 NOTE — IPAL (Signed)
  Interdisciplinary Goals of Care Family Meeting   Date carried out: 12/24/2021  Location of the meeting: Bedside  Member's involved: Physician and Family Member or next of kin, daughter  Durable Power of Attorney or Loss adjuster, chartered: patient  Discussion: We discussed goals of care for Christine Obrien .    Code status: Full Code  Disposition: Continue current acute care  Time spent for the meeting: [place], MD  12/24/2021, 3:12 AM

## 2021-12-24 NOTE — Consult Note (Cosign Needed)
[place] NOTE       Patient ID: REE ALCALDE MRN: [id number] DOB/AGE: 09/25/1940 81 y.o.  Admit date: 12/23/2021 Referring Physician Dr. Judd Gaudier  Primary Physician Dr. Lovie Macadamia Primary Cardiologist Dr. Nehemiah Massed Reason for Consultation AF RVR  HPI: Christine Obrien is an 26WLS with a PMH of CAD s/p CABG x2 in 06/2015 (LIMA to LAD, SVG to OM1), HF with recovered EF (70%, previously 35%, G2 DD) s/p CRT-D 04/2019, paroxysmal atrial fibrillation on Eliquis, COPD, hyperlipidemia, type 2 diabetes who presented to [facility] ED 12/23/2021 with generalized weakness and fatigue is in A-fib with RVR. Cardiology is consulted for further assistance.    She presents with her daughter who contributes to the history.  She says that ever since she was discharged from the hospital on 8/25 she has been feeling very weak, "like her strength is gone."  She has not been eating very well for the past several days, has been mostly dry heaving with a couple episodes of vomiting up liquid.  She had 1 black stool at some point last week but has also notably been taking iron supplements.  She has been having 4-6 very thin bowel movements per day which occasionally happens to her, but her daughter notes her issue is mainly constipation (has not had a well-formed good stool in a week or so).  She had some belly pain when she came into the hospital but this is now resolved.  She has an occasional fluttering sensation in her chest that last for seconds before going away.  She has been short of breath when she ambulates, but has been so weak she has not really been walking around much lately, unclear if due to shortness of breath or significant weakness.  She otherwise denies chest pain, orthopnea, PND, leg swelling.  Smokes 1 pack/day, but has not smoked since her hospital discharge on 8/25.  In the ED, she was given 2 doses of 40 mg IV Lasix, IV diltiazem 10 mg x 1, 15 mg x 1, and started on a diltiazem  drip.  Recent vitals are notable for a blood pressure of 119/60, SPO2 92% on oxygen by nasal cannula.  During interview, heart rate mostly in the 70s to 90s with short paroxysms into the 100s to 120s in atrial fibrillation on telemetry.  Labs are notable for hypokalemia with potassium 2.9, and AKI with BUN/creatinine 27/1.45, GFR 36 on admission, slightly improving to creatinine 1.34 and GFR 40 this morning.  Magnesium within normal limits at 2.0.  BNP elevated at 2300, high-sensitivity troponin minimally elevated and flat trending at 35-38.  Procalcitonin negative slight leukocytosis with WBCs trending 12.2-12.7, H&H 11.3/36 with platelets 199.  TSH slightly elevated at 4.9 and T4 within normal limits at 0.89.  Chest x-ray with cardiomegaly and improving interval findings of perihilar vascular congestion and interstitial edema with small pleural effusions.  Head CT was negative for acute intracranial abnormality, CT abdomen pelvis with moderate colonic stool burden without bowel obstruction.  Review of systems complete and found to be negative unless listed above     Past Medical History:  Diagnosis Date   AICD (automatic cardioverter/defibrillator) present    Anxiety    Asthma    Atherosclerotic peripheral vascular disease with intermittent claudication (HCC)    Cancer (HCC)    cervical   Carotid artery occlusion    CHF (congestive heart failure), NYHA class III (HCC)    CKD (chronic kidney disease), stage III ([place])  COPD (chronic obstructive pulmonary disease) (HCC)    Coronary artery disease    Current use of long term anticoagulation    Depression    Diabetes mellitus without complication (HCC)    GERD (gastroesophageal reflux disease)    Gout    HLD (hyperlipidemia)    Hx of CABG    Hypertension    Hypothyroidism    Kidney stones    LBBB (left bundle branch block)    Myocardial infarction ([place])    Presence of permanent cardiac pacemaker    Subclavian steal syndrome      Past Surgical History:  Procedure Laterality Date   ABDOMINAL HYSTERECTOMY     partial   CHOLECYSTECTOMY     COLONOSCOPY WITH PROPOFOL N/A 01/11/2020   Procedure: COLONOSCOPY WITH PROPOFOL;  Surgeon: Toledo, Benay Pike, MD;  Location: [facility] ENDOSCOPY;  Service: Gastroenterology;  Laterality: N/A;   CORONARY ANGIOPLASTY     CORONARY ARTERY BYPASS GRAFT     double   ENDARTERECTOMY Right 05/08/2021   Procedure: ENDARTERECTOMY CAROTID;  Surgeon: Algernon Huxley, MD;  Location: [facility] ORS;  Service: Vascular;  Laterality: Right;   EYE SURGERY Bilateral 08/2009   cataracts   HIATAL HERNIA REPAIR  1980s   LEFT HEART CATH AND CORONARY ANGIOGRAPHY N/A 07/03/2017   Procedure: LEFT HEART CATH AND CORONARY ANGIOGRAPHY;  Surgeon: Corey Skains, MD;  Location: [place] [facility];  Service: Cardiovascular;  Laterality: N/A;   LOWER EXTREMITY ANGIOGRAPHY Left 01/11/2018   Procedure: LOWER EXTREMITY ANGIOGRAPHY;  Surgeon: Algernon Huxley, MD;  Location: [place] [facility];  Service: Cardiovascular;  Laterality: Left;   THYROID SURGERY     pt does not remember if removed or partial removal    (Not in a hospital admission)  Social History   Socioeconomic History   Marital status: Widowed    Spouse name: Not on file   Number of children: Not on file   Years of education: Not on file   Highest education level: Not on file  Occupational History   Not on file  Tobacco Use   Smoking status: Every Day    Packs/day: 1.00    Types: Cigarettes   Smokeless tobacco: Never  Vaping Use   Vaping Use: Never used  Substance and Sexual Activity   Alcohol use: No   Drug use: No   Sexual activity: Not on file  Other Topics Concern   Not on file  Social History Narrative   Not on file   Social Determinants of Health   Financial Resource Strain: Not on file  Food Insecurity: Not on file  Transportation Needs: Not on file  Physical Activity: Not on file  Stress: Not on file  Social Connections: Not on  file  Intimate Partner Violence: Not on file    Family History  Problem Relation Age of Onset   Hypertension Mother    Hypertension Father    Diabetes Sister    Cancer Brother    Diabetes Brother    Breast cancer Neg Hx       PHYSICAL EXAM General: Pleasant elderly and frail-appearing Caucasian female, in no acute distress.  Sitting upright in ED stretcher with daughter at bedside HEENT:  Normocephalic and atraumatic. Neck:  No JVD.  Lungs: Normal respiratory effort on oxygen by nasal cannula.  Decreased breath sounds bilaterally without appreciable crackles or wheezes.   Heart: Irregularly irregular rhythm with mostly controlled rate. Normal S1 and S2 without gallops or murmurs.  Abdomen: Somewhat  firm to the touch, nontender to palpation. Msk: Normal strength and tone for age. Extremities: Warm and well perfused. No clubbing, cyanosis.  No peripheral edema.  Neuro: Alert and oriented X 3. Psych:  Answers questions appropriately.   Labs: Basic Metabolic Panel: Recent Labs    12/23/21 2004 12/23/21 2206 12/24/21 0456  NA 139  --  141  K 3.6  --  2.9*  CL 101  --  99  CO2 27  --  29  GLUCOSE 152*  --  118*  BUN 27*  --  25*  CREATININE 1.45*  --  1.34*  CALCIUM 9.5  --  9.3  MG  --  2.2 2.0   Liver Function Tests: Recent Labs    12/23/21 2004  AST 26  ALT 41  ALKPHOS 53  BILITOT 1.0  PROT 6.7  ALBUMIN 3.7   Recent Labs    12/23/21 2004  LIPASE 24   CBC: Recent Labs    12/23/21 2004 12/24/21 0456  WBC 12.2* 12.7*  NEUTROABS 10.1*  --   HGB 12.1 11.3*  HCT 38.9 36.0  MCV 98.2 96.8  PLT 238 199   Cardiac Enzymes: Recent Labs    12/23/21 2004 12/23/21 2206  TROPONINIHS 35* 38*   BNP: Invalid input(s): "POCBNP" D-Dimer: No results for input(s): "DDIMER" in the last 72 hours. Hemoglobin A1C: No results for input(s): "HGBA1C" in the last 72 hours. Fasting Lipid Panel: No results for input(s): "CHOL", "HDL", "LDLCALC", "TRIG", "CHOLHDL",  "LDLDIRECT" in the last 72 hours. Thyroid Function Tests: Recent Labs    12/23/21 2004  TSH 4.923*   Anemia Panel: No results for input(s): "VITAMINB12", "FOLATE", "FERRITIN", "TIBC", "IRON", "RETICCTPCT" in the last 72 hours.  CT HEAD WO CONTRAST (5MM)  Result Date: 12/23/2021 CLINICAL DATA:  Sudden severe headache. General malaise for several days. EKG with atrial fibrillation with RVR. EXAM: CT HEAD WITHOUT CONTRAST TECHNIQUE: Contiguous axial images were obtained from the base of the skull through the vertex without intravenous contrast. RADIATION DOSE REDUCTION: This exam was performed according to the departmental dose-optimization program which includes automated exposure control, adjustment of the mA and/or kV according to patient size and/or use of iterative reconstruction technique. 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 No new asymmetry is seen concerning for an acute infarct, hemorrhage or mass. There is no midline shift. Basal cisterns are clear. Vascular: There are patchy calcifications in the carotid siphons but no hyperdense central vessels. Skull: Negative for fractures or focal lesions. Sinuses/Orbits: No acute findings. No mastoid effusions. Old lens extractions. Other: None. IMPRESSION: No acute intracranial CT findings or interval changes. Chronic change. Electronically Signed   By: Telford Nab M.D.   On: 12/23/2021 21:37   CT ABDOMEN PELVIS W CONTRAST  Result Date: 12/23/2021 CLINICAL DATA:  Abdominal pain and fatigue.  Generalized malaise. EXAM: CT ABDOMEN AND PELVIS WITH CONTRAST TECHNIQUE: Multidetector CT imaging of the abdomen and pelvis was performed using the standard protocol following bolus administration of intravenous contrast. RADIATION DOSE REDUCTION: This exam was performed  according to the departmental dose-optimization program which includes automated exposure control, adjustment of the mA and/or kV according to patient size and/or use of iterative reconstruction technique. CONTRAST:  27m OMNIPAQUE IOHEXOL 300 MG/ML  SOLN COMPARISON:  CT abdomen pelvis dated 06/12/2012. FINDINGS: Lower chest: Partially visualized small bilateral pleural effusions with partial  compressive atelectasis of the lower lobes. Pneumonia is not excluded. Cardiac pacemaker wires noted. No intra-abdominal free air.  Trace free fluid within the pelvis. Hepatobiliary: Fatty liver. No biliary dilatation. Cholecystectomy. No retained calcified stone noted in the central CBD. Pancreas: Unremarkable. No pancreatic ductal dilatation or surrounding inflammatory changes. Spleen: Normal in size without focal abnormality. Adrenals/Urinary Tract: Bilateral adrenal thickening/hyperplasia or adenoma similar to prior CT. There is no hydronephrosis on either side. The visualized ureters and urinary bladder appear unremarkable. Stomach/Bowel: There is moderate stool throughout the colon. There is no bowel obstruction or active inflammation. The appendix is not visualized with certainty. No inflammatory changes identified in the right lower quadrant. Vascular/Lymphatic: Advanced aortoiliac atherosclerotic disease. Bilateral common iliac artery stent. The stents remain patent. There is a 3 cm infrarenal abdominal aortic aneurysm. The IVC is unremarkable. No portal venous gas. There is no adenopathy. Reproductive: Hysterectomy.  No adnexal masses. Other: None Musculoskeletal: No acute or significant osseous findings. IMPRESSION: 1. No acute intra-abdominal or pelvic pathology. 2. Moderate colonic stool burden. No bowel obstruction. 3. Partially visualized small bilateral pleural effusions with partial compressive atelectasis of the lower lobes. Pneumonia is not excluded. 4. Aortic Atherosclerosis (ICD10-I70.0). Electronically  Signed   By: Anner Crete M.D.   On: 12/23/2021 21:34   DG Chest Portable 1 View  Result Date: 12/23/2021 CLINICAL DATA:  Shortness of breath, fatigue and malaise. EXAM: PORTABLE CHEST 1 VIEW COMPARISON:  Portable chest 12/12/21 FINDINGS: Left chest pacemaker/defibrillator and wiring are unchanged in appearance as well as CABG changes. The heart has mildly enlarged compared to the recent study. There is aortic atherosclerosis with stable mediastinum. There is mild perihilar vascular congestion, mild interstitial edema in the lower lung fields with small pleural effusions. The interstitial edema has improved in that it has regressed from the upper lung fields since prior study. No focal pneumonia is seen. In all other respects no further changes. Osteopenia and thoracic spondylosis. IMPRESSION: Cardiomegaly with perihilar vascular congestion and mild lower zonal interstitial edema, with small pleural effusions. No acute airspace infiltrate. Similar but interval improved findings on the last study. Electronically Signed   By: Telford Nab M.D.   On: 12/23/2021 20:20     Radiology: CT HEAD WO CONTRAST (5MM)  Result Date: 12/23/2021 CLINICAL DATA:  Sudden severe headache. General malaise for several days. EKG with atrial fibrillation with RVR. EXAM: CT HEAD WITHOUT CONTRAST TECHNIQUE: Contiguous axial images were obtained from the base of the skull through the vertex without intravenous contrast. RADIATION DOSE REDUCTION: This exam was performed according to the departmental dose-optimization program which includes automated exposure control, adjustment of the mA and/or kV according to patient size and/or use of iterative reconstruction technique. 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 No new asymmetry is seen concerning  for an acute infarct, hemorrhage or mass. There is no midline shift. Basal cisterns are clear. Vascular: There are patchy calcifications in the carotid siphons but no hyperdense central vessels. Skull: Negative for fractures or focal lesions. Sinuses/Orbits: No acute findings. No mastoid effusions. Old lens extractions. Other: None. IMPRESSION: No acute intracranial CT findings or interval changes. Chronic change. Electronically Signed   By: Telford Nab M.D.   On: 12/23/2021 21:37   CT ABDOMEN PELVIS W CONTRAST  Result Date: 12/23/2021 CLINICAL DATA:  Abdominal pain and fatigue.  Generalized malaise. EXAM: CT ABDOMEN  AND PELVIS WITH CONTRAST TECHNIQUE: Multidetector CT imaging of the abdomen and pelvis was performed using the standard protocol following bolus administration of intravenous contrast. RADIATION DOSE REDUCTION: This exam was performed according to the departmental dose-optimization program which includes automated exposure control, adjustment of the mA and/or kV according to patient size and/or use of iterative reconstruction technique. CONTRAST:  78m OMNIPAQUE IOHEXOL 300 MG/ML  SOLN COMPARISON:  CT abdomen pelvis dated 06/12/2012. FINDINGS: Lower chest: Partially visualized small bilateral pleural effusions with partial compressive atelectasis of the lower lobes. Pneumonia is not excluded. Cardiac pacemaker wires noted. No intra-abdominal free air.  Trace free fluid within the pelvis. Hepatobiliary: Fatty liver. No biliary dilatation. Cholecystectomy. No retained calcified stone noted in the central CBD. Pancreas: Unremarkable. No pancreatic ductal dilatation or surrounding inflammatory changes. Spleen: Normal in size without focal abnormality. Adrenals/Urinary Tract: Bilateral adrenal thickening/hyperplasia or adenoma similar to prior CT. There is no hydronephrosis on either side. The visualized ureters and urinary bladder appear unremarkable. Stomach/Bowel: There is moderate stool throughout  the colon. There is no bowel obstruction or active inflammation. The appendix is not visualized with certainty. No inflammatory changes identified in the right lower quadrant. Vascular/Lymphatic: Advanced aortoiliac atherosclerotic disease. Bilateral common iliac artery stent. The stents remain patent. There is a 3 cm infrarenal abdominal aortic aneurysm. The IVC is unremarkable. No portal venous gas. There is no adenopathy. Reproductive: Hysterectomy.  No adnexal masses. Other: None Musculoskeletal: No acute or significant osseous findings. IMPRESSION: 1. No acute intra-abdominal or pelvic pathology. 2. Moderate colonic stool burden. No bowel obstruction. 3. Partially visualized small bilateral pleural effusions with partial compressive atelectasis of the lower lobes. Pneumonia is not excluded. 4. Aortic Atherosclerosis (ICD10-I70.0). Electronically Signed   By: AAnner CreteM.D.   On: 12/23/2021 21:34   DG Chest Portable 1 View  Result Date: 12/23/2021 CLINICAL DATA:  Shortness of breath, fatigue and malaise. EXAM: PORTABLE CHEST 1 VIEW COMPARISON:  Portable chest 12/12/21 FINDINGS: Left chest pacemaker/defibrillator and wiring are unchanged in appearance as well as CABG changes. The heart has mildly enlarged compared to the recent study. There is aortic atherosclerosis with stable mediastinum. There is mild perihilar vascular congestion, mild interstitial edema in the lower lung fields with small pleural effusions. The interstitial edema has improved in that it has regressed from the upper lung fields since prior study. No focal pneumonia is seen. In all other respects no further changes. Osteopenia and thoracic spondylosis. IMPRESSION: Cardiomegaly with perihilar vascular congestion and mild lower zonal interstitial edema, with small pleural effusions. No acute airspace infiltrate. Similar but interval improved findings on the last study. Electronically Signed   By: KTelford NabM.D.   On: 12/23/2021  20:20   DG Chest Port 1 View  Result Date: 12/12/2021 CLINICAL DATA:  Dyspnea EXAM: PORTABLE CHEST 1 VIEW COMPARISON:  12/10/2021 FINDINGS: The lungs are hyperinflated in keeping with changes of underlying COPD, stable since prior examination. Superimposed peripheral bibasilar interstitial opacities are present most in keeping with mild interstitial pulmonary edema. This appears stable since prior examination. No pneumothorax or pleural effusion. Coronary artery bypass grafting has been performed. Cardiac size is within normal limits. Left subclavian pacemaker defibrillator is unchanged. No acute bone abnormality. IMPRESSION: 1. Stable mild interstitial pulmonary edema. 2. COPD. Electronically Signed   By: AFidela SalisburyM.D.   On: 12/12/2021 02:23   ECHOCARDIOGRAM COMPLETE  Result Date: 12/11/2021    ECHOCARDIOGRAM REPORT   Patient Name:   AACHSAH MCQUADEDate of Exam:  12/10/2021 Medical Rec #:  [id number]   Height: Accession #:    [id number]  Weight: Date of Birth:  16-Jan-1941   BSA: Patient Age:    35 years    BP:           83/61 mmHg Patient Gender: F           HR:           79 bpm. Exam Location:  [facility] Procedure: 2D Echo, Cardiac Doppler and Color Doppler Indications:     Acute respiratory distress R06.03  History:         Patient has no prior history of Echocardiogram examinations.                  CHF, CAD and Previous Myocardial Infarction, Prior CABG,                  Pacemaker and Endarterectomy, COPD, Arrythmias:LBBB; Risk                  Factors:Current Smoker, Diabetes, Hypertension and                  Dyslipidemia.  Sonographer:     Rosalia Hammers Referring Phys:  [place] Diagnosing Phys: Yolonda Kida MD  Sonographer Comments: Image quality was good. IMPRESSIONS  1. Left ventricular ejection fraction, by estimation, is 70 to 75%. The left ventricle has hyperdynamic function. The left ventricle has no regional wall motion abnormalities. There is mild left ventricular hypertrophy. Left  ventricular diastolic parameters are consistent with Grade II diastolic dysfunction (pseudonormalization).  2. Right ventricular systolic function is normal. The right ventricular size is normal.  3. The mitral valve is normal in structure. Trivial mitral valve regurgitation.  4. The aortic valve is normal in structure. Aortic valve regurgitation is trivial. FINDINGS  Left Ventricle: Left ventricular ejection fraction, by estimation, is 70 to 75%. The left ventricle has hyperdynamic function. The left ventricle has no regional wall motion abnormalities. The left ventricular internal cavity size was normal in size. There is mild left ventricular hypertrophy. Left ventricular diastolic parameters are consistent with Grade II diastolic dysfunction (pseudonormalization). Right Ventricle: The right ventricular size is normal. No increase in right ventricular wall thickness. Right ventricular systolic function is normal. Left Atrium: Left atrial size was normal in size. Right Atrium: Right atrial size was normal in size. Pericardium: There is no evidence of pericardial effusion. Mitral Valve: The mitral valve is normal in structure. Trivial mitral valve regurgitation. Tricuspid Valve: The tricuspid valve is normal in structure. Tricuspid valve regurgitation is mild. Aortic Valve: The aortic valve is normal in structure. Aortic valve regurgitation is trivial. Aortic valve mean gradient measures 7.0 mmHg. Aortic valve peak gradient measures 12.8 mmHg. Aortic valve area, by VTI measures 1.29 cm. Pulmonic Valve: The pulmonic valve was normal in structure. Pulmonic valve regurgitation is not visualized. Aorta: The ascending aorta was not well visualized. IAS/Shunts: No atrial level shunt detected by color flow Doppler.  LEFT VENTRICLE PLAX 2D LVIDd:         4.35 cm   Diastology LVIDs:         2.62 cm   LV e' medial:    4.35 cm/s LV PW:         1.40 cm   LV E/e' medial:  37.7 LV IVS:        1.28 cm   LV e' lateral:   4.90 cm/s  LVOT diam:  1.60 cm   LV E/e' lateral: 33.5 LV SV:         49 LVOT Area:     2.01 cm  RIGHT VENTRICLE RV Basal diam:  2.82 cm RV Mid diam:    3.12 cm RV S prime:     13.20 cm/s TAPSE (M-mode): 2.3 cm LEFT ATRIUM             RIGHT ATRIUM LA diam:        3.50 cm RA Area:     16.00 cm LA Vol (A2C):   43.5 ml RA Volume:   41.50 ml LA Vol (A4C):   38.7 ml LA Biplane Vol: 41.8 ml  AORTIC VALVE AV Area (Vmax):    1.21 cm AV Area (Vmean):   1.22 cm AV Area (VTI):     1.29 cm AV Vmax:           179.00 cm/s AV Vmean:          129.000 cm/s AV VTI:            0.381 m AV Peak Grad:      12.8 mmHg AV Mean Grad:      7.0 mmHg LVOT Vmax:         108.00 cm/s LVOT Vmean:        78.400 cm/s LVOT VTI:          0.244 m LVOT/AV VTI ratio: 0.64  AORTA Ao Root diam: 2.70 cm MITRAL VALVE                TRICUSPID VALVE MV Area (PHT): 3.42 cm     TR Peak grad:   49.0 mmHg MV Decel Time: 222 msec     TR Vmax:        350.00 cm/s MV E velocity: 164.00 cm/s MV A velocity: 160.00 cm/s  SHUNTS MV E/A ratio:  1.02         Systemic VTI:  0.24 m                             Systemic Diam: 1.60 cm Yolonda Kida MD Electronically signed by Yolonda Kida MD Signature Date/Time: 12/11/2021/5:30:23 PM    Final    DG Chest 2 View  Result Date: 12/10/2021 CLINICAL DATA:  Shortness of breath for 1 day. EXAM: CHEST - 2 VIEW COMPARISON:  07/21/2020 FINDINGS: UPPER limits normal heart size, CABG changes and LEFT-sided pacemaker/AICD again noted. Mild interstitial opacities and trace bilateral pleural effusions are noted. There is no evidence of pneumothorax or acute bony abnormality. IMPRESSION: Mild interstitial opacities and trace bilateral pleural, favor mild pulmonary edema. Electronically Signed   By: Margarette Canada M.D.   On: 12/10/2021 09:29    ECHO 12/10/2021, LVEF 70-75%, G2 DD  TELEMETRY reviewed by me (LT) 12/24/2021 : Initially atrial fibrillation with rates in the 110s to 120s, after diltiazem drip overnight, atrial fibrillation in  the 70s to 90s with short paroxysms into the 100s to 120s.  EKG reviewed by me: Atrial fibrillation, LBBB rate 123  Data reviewed by me (LT) 12/24/2021: ED note, admission H&P, CBC,'s BMP, chest x-ray, EKG, telemetry discharge summary, cardiology note 8/31  ASSESSMENT AND PLAN:  Christine Obrien is an 17XWR with a PMH of CAD s/p CABG x2 in 06/2015 (LIMA to LAD, SVG to OM1), HF with recovered EF (70%, previously 35%, G2 DD) s/p CRT-D 04/2019, paroxysmal atrial fibrillation on Eliquis, COPD,  hyperlipidemia, type 2 diabetes who presented to [facility] ED 12/23/2021 with generalized weakness and fatigue is in A-fib with RVR. Cardiology is consulted for further assistance.    #Generalized weakness #Constipation vs GI illness The patient presents with generalized weakness and fatigue since her hospital discharge on 8/25.  She initially was eating okay, several meals a day without difficulty, but over the past few days she has been nauseous and dry heaving, and a couple episodes of vomiting up liquid.  Her daughter notes she sometimes has diarrhea and constipation, most recently having 4-6 small, stringy bowel movements per day and has not had a well-formed stool in 4 to 5 days.  Possibly some component of deconditioning versus overdiuresis contributing to this. -Management per primary team.  #Paroxysmal atrial fibrillation with RVR #Chronic LBBB Noted during her outpatient cardiology visit on 8/31 to be in atrial fibrillation with heart rates in the 130s, metoprolol was increased to 50 mg twice daily.  She was in A-fib with RVR on presentation to the ED with heart rates in the 100s to 120s on presentation, started on a diltiazem drip with improvement.  Converted to sinus rhythm after administration of p.o. 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  She was discharged on 20 mg of p.o. Lasix daily.  She currently denies any orthopnea, PND (actually notes she can breathe better when lying down) and does not appear clinically grossly volume overloaded on exam, although her BNP is elevated at 2300.  Decompensation can possibly be attributed to her A-fib and rapid heart rate as above. -S/p 40 mg IV Lasix x2, discontinue further doses for today, consider restarting tomorrow. -Continue GDMT with metoprolol. Hold losartan and Jardiance due to AKI as below -Monitor and replete electrolytes for goal K >4, mag >2  #AKI Creatinine on admission 1.45 and GFR 36, somewhat improving this morning to 1.34 and 40 with diuresis Baseline from discharge on 8/25 was 0.94 and greater than 60.  This patient's plan of care was discussed and created with Dr. Nehemiah Massed and he is in agreement.  Signed: Tristan Schroeder , PA-C 12/24/2021, 8:10 AM [facility]  Patient has had some new onset of significant concerns for shortness of breath palpitations and irregularity of her heartbeat.  She has had known paroxysmal nonvalvular atrial fibrillation for which she has likely caused the symptoms.  She was placed on appropriate medication management for heart rate control and now has spontaneously converted to normal sinus rhythm.  She has previously been on appropriate medication management for treatment of her acute on chronic congestive heart failure left bundle branch block paroxysmal nonvalvular atrial fibrillation.  Now that she is spontaneously converted to normal rhythm would continue these medications as before.  I have personally reviewed and evaluated her EKG, chest x-ray, laboratory work, telemetry  The patient has been interviewed  and examined. I agree with assessment and plan above. Serafina Royals MD [facility]

## 2021-12-24 NOTE — ED Notes (Signed)
Spoke with PA Tang with cardiology pt sb HR 60 states hold dilt for now and she will DC order. Pt has few bites to eat bg 156 pt declines insulin states she is not eating anymore. Declines insulin again states does not use at home but will discuss before supper. MD Amery at bedside, verbal order for lidocaine patch placed for her foot pain. Pt agreeable with plan . MD states to cut patch to place on top of both feet.

## 2021-12-24 NOTE — Progress Notes (Signed)
This is a no charge noticed patient was admitted this AM.  Patient seen and examined.    Christine Obrien is a 81 y.o. 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  She denied chest pain but endorses increasing fatigue and dyspnea on exertion.  She denies cough, fever or chills. ED course and data review: Afebrile, heart rate up to 126, BP 127/93, O2 sat 93% on 2 L.  Labs with first troponin 35, BNP 2319, up from 985 a couple weeks prior..  WBC 12,000, creatinine 1.45 up from 0.94 couple weeks prior.  TSH 4.9 with normal free T4, procalcitonin less than 0.10 and COVID-negative EKG, personally viewed and interpreted showing A-fib at 123 with LBBB Chest x-ray shows the following: IMPRESSION: Cardiomegaly with perihilar vascular congestion and mild lower zonal interstitial edema, with small pleural effusions. No acute airspace infiltrate. Similar but interval improved findings on the last study.     CT head and CT abdomen and pelvis were nonacute. Patient was given a diltiazem bolus and started on a diltiazem infusion given a dose of IV Lasix and hospitalist consulted for admission for rapid A-fib and CHF exacerbation..    She is complaining of right medial bunion pain.  Also complaining of bilateral toe pains.  No history of gout per patient. Currently in sinus rhythm.  Nad Decrease bs, Reg  Anterior /medial aspect of foot R>L tender to touch.   A/P Lidocaine patch Ck uric acid

## 2021-12-24 NOTE — ED Notes (Signed)
Pt O2 removed for room air saturation, state does not wear O2 at Home. Room air satuartion 88%. 3L [place] reapplied for saturation 94-96%\. Pt HR 80-100 on '10Mg'$  Dilt gtt. Pt awake and eating breakfast HR 85-105 will monitor.

## 2021-12-24 NOTE — Progress Notes (Signed)
       CROSS COVER NOTE  NAME: Christine Obrien MRN: [id number] DOB : Sep 15, 1940    Date of Service   12/24/2021   HPI/Events of Note   Medication request received for patient report of 10/10 toe pain described as aching burning and sharp.  16: Notified of AM K-->2.9.   Interventions   Plan: Morphine x1  40 mEQ PO K x2    This document was prepared using Dragon voice recognition software and may include unintentional dictation errors.  Neomia Glass DNP, MHA, FNP-BC Nurse Practitioner [facility] Pager [phone]

## 2021-12-25 DIAGNOSIS — N179 Acute kidney failure, unspecified: Secondary | ICD-10-CM | POA: Diagnosis not present

## 2021-12-25 DIAGNOSIS — I4891 Unspecified atrial fibrillation: Secondary | ICD-10-CM | POA: Diagnosis not present

## 2021-12-25 DIAGNOSIS — I5023 Acute on chronic systolic (congestive) heart failure: Secondary | ICD-10-CM | POA: Diagnosis not present

## 2021-12-25 LAB — BASIC METABOLIC PANEL
Anion gap: 9 (ref 5–15)
BUN: 23 mg/dL (ref 8–23)
CO2: 26 mmol/L (ref 22–32)
Calcium: 9.3 mg/dL (ref 8.9–10.3)
Chloride: 102 mmol/L (ref 98–111)
Creatinine, Ser: 1.08 mg/dL — ABNORMAL HIGH (ref 0.44–1.00)
GFR, Estimated: 52 mL/min — ABNORMAL LOW (ref >60)
Glucose, Bld: 87 mg/dL (ref 70–99)
Potassium: 3.4 mmol/L — ABNORMAL LOW (ref 3.5–5.1)
Sodium: 137 mmol/L (ref 135–145)

## 2021-12-25 LAB — PROCALCITONIN: Procalcitonin: <0.1 ng/mL

## 2021-12-25 LAB — CBC
HCT: 35.5 % — ABNORMAL LOW (ref 36.0–46.0)
Hemoglobin: 11.3 g/dL — ABNORMAL LOW (ref 12.0–15.0)
MCH: 31 pg (ref 26.0–34.0)
MCHC: 31.8 g/dL (ref 30.0–36.0)
MCV: 97.5 fL (ref 80.0–100.0)
Platelets: 166 10*3/uL (ref 150–400)
RBC: 3.64 MIL/uL — ABNORMAL LOW (ref 3.87–5.11)
RDW: 16 % — ABNORMAL HIGH (ref 11.5–15.5)
WBC: 8.3 10*3/uL (ref 4.0–10.5)
nRBC: 0 % (ref 0.0–0.2)

## 2021-12-25 LAB — GLUCOSE, CAPILLARY
Glucose-Capillary: 89 mg/dL (ref 70–99)
Glucose-Capillary: 131 mg/dL — ABNORMAL HIGH (ref 70–99)
Glucose-Capillary: 164 mg/dL — ABNORMAL HIGH (ref 70–99)
Glucose-Capillary: 170 mg/dL — ABNORMAL HIGH (ref 70–99)
Glucose-Capillary: 181 mg/dL — ABNORMAL HIGH (ref 70–99)

## 2021-12-25 MED ORDER — METOPROLOL TARTRATE 50 MG PO TABS
50.0000 mg | ORAL_TABLET | Freq: Two times a day (BID) | ORAL | Status: DC
  Administered 2021-12-25 – 2021-12-26 (×2): 50 mg via ORAL
  Filled 2021-12-25 (×2): qty 1

## 2021-12-25 MED ORDER — POTASSIUM CHLORIDE CRYS ER 10 MEQ PO TBCR
30.0000 meq | EXTENDED_RELEASE_TABLET | Freq: Once | ORAL | Status: AC
  Administered 2021-12-25: 30 meq via ORAL
  Filled 2021-12-25: qty 1

## 2021-12-25 MED ORDER — FUROSEMIDE 10 MG/ML IJ SOLN
40.0000 mg | Freq: Once | INTRAMUSCULAR | Status: AC
  Administered 2021-12-25: 40 mg via INTRAVENOUS
  Filled 2021-12-25: qty 4

## 2021-12-25 NOTE — Progress Notes (Addendum)
[place] NOTE       Patient ID: Christine Obrien MRN: [id number] DOB/AGE: [birth year] 80 y.o.  Admit date: 12/23/2021 Referring Physician Dr. Judd Gaudier  Primary Physician Dr. Lovie Macadamia Primary Cardiologist Dr. Nehemiah Massed Reason for Consultation AF RVR  HPI: Christine Obrien is an 41NUU with a PMH of CAD s/p CABG x2 in 06/2015 (LIMA to LAD, SVG to OM1), HF with recovered EF (70%, previously 35%, G2 DD) s/p CRT-D 04/2019, paroxysmal atrial fibrillation on Eliquis, COPD, hyperlipidemia, type 2 diabetes who presented to [facility] ED 12/23/2021 with generalized weakness and fatigue is in A-fib with RVR. Cardiology is consulted for further assistance.    Interval History: -feels overall better, can't point out specifics -remained in sinus / paced rhythm, rate controlled in the 60s.  -no further nausea, SOB, no chest pain or palpitations  Review of systems complete and found to be negative unless listed above     Past Medical History:  Diagnosis Date   AICD (automatic cardioverter/defibrillator) present    Anxiety    Asthma    Atherosclerotic peripheral vascular disease with intermittent claudication (HCC)    Cancer (HCC)    cervical   Carotid artery occlusion    CHF (congestive heart failure), NYHA class III (HCC)    CKD (chronic kidney disease), stage III (HCC)    COPD (chronic obstructive pulmonary disease) ([place])    Coronary artery disease    Current use of long term anticoagulation    Depression    Diabetes mellitus without complication (HCC)    GERD (gastroesophageal reflux disease)    Gout    HLD (hyperlipidemia)    Hx of CABG    Hypertension    Hypothyroidism    Kidney stones    LBBB (left bundle branch block)    Myocardial infarction ([place])    Presence of permanent cardiac pacemaker    Subclavian steal syndrome     Past Surgical History:  Procedure Laterality Date   ABDOMINAL HYSTERECTOMY     partial   CHOLECYSTECTOMY     COLONOSCOPY WITH PROPOFOL N/A  01/11/2020   Procedure: COLONOSCOPY WITH PROPOFOL;  Surgeon: Toledo, Benay Pike, MD;  Location: [facility] ENDOSCOPY;  Service: Gastroenterology;  Laterality: N/A;   CORONARY ANGIOPLASTY     CORONARY ARTERY BYPASS GRAFT     double   ENDARTERECTOMY Right 05/08/2021   Procedure: ENDARTERECTOMY CAROTID;  Surgeon: Algernon Huxley, MD;  Location: [facility] ORS;  Service: Vascular;  Laterality: Right;   EYE SURGERY Bilateral 08/2009   cataracts   HIATAL HERNIA REPAIR  1980s   LEFT HEART CATH AND CORONARY ANGIOGRAPHY N/A 07/03/2017   Procedure: LEFT HEART CATH AND CORONARY ANGIOGRAPHY;  Surgeon: Corey Skains, MD;  Location: [facility];  Service: Cardiovascular;  Laterality: N/A;   LOWER EXTREMITY ANGIOGRAPHY Left 01/11/2018   Procedure: LOWER EXTREMITY ANGIOGRAPHY;  Surgeon: Algernon Huxley, MD;  Location: [place] [facility];  Service: Cardiovascular;  Laterality: Left;   THYROID SURGERY     pt does not remember if removed or partial removal    Medications Prior to Admission  Medication Sig Dispense Refill Last Dose   Acetaminophen (TYLENOL PO) Take 500 mg by mouth.    prn at prn   apixaban (ELIQUIS) 5 MG TABS tablet Take 5 mg by mouth 2 (two) times daily.   12/23/2021 at 0930   aspirin EC 81 MG tablet Take 1 tablet (81 mg total) by mouth daily. Swallow whole. 30 tablet 11  12/23/2021 at 0930   buPROPion ER (WELLBUTRIN SR) 100 MG 12 hr tablet Take 100 mg by mouth 2 (two) times daily.   12/23/2021 at 0930   empagliflozin (JARDIANCE) 10 MG TABS tablet Take 10 mg by mouth daily.   12/23/2021 at 0930   ezetimibe (ZETIA) 10 MG tablet Take 10 mg by mouth daily.      ferrous sulfate 325 (65 FE) MG tablet Take 325 mg by mouth daily with breakfast.  0 12/23/2021 at 0930   fluticasone (FLONASE) 50 MCG/ACT nasal spray Place 2 sprays into the nose daily.   12/23/2021 at 0930   fluticasone-salmeterol (ADVAIR) 250-50 MCG/ACT AEPB Inhale 1 puff into the lungs in the morning and at bedtime.   12/23/2021 at 0930   furosemide  (LASIX) 20 MG tablet Take 1 tablet (20 mg total) by mouth daily. 15 tablet 0 12/23/2021 at 0930   gabapentin (NEURONTIN) 100 MG capsule Take 100 mg by mouth 3 (three) times daily as needed.   prn at prn   glipiZIDE (GLUCOTROL) 5 MG tablet Take 5 mg by mouth 2 (two) times daily before a meal.   12/23/2021 at 0930   icosapent Ethyl (VASCEPA) 1 g capsule Take 1 g by mouth 2 (two) times daily with a meal.   12/23/2021 at 0930   ipratropium-albuterol (DUONEB) 0.5-2.5 (3) MG/3ML SOLN Take 3 mLs by nebulization 2 (two) times daily. 360 mL 1 12/23/2021 at 0930   levothyroxine (SYNTHROID) 137 MCG tablet Take 1 tablet (137 mcg total) by mouth daily. 30 tablet 3 12/23/2021 at 0930   loratadine (CLARITIN) 10 MG tablet Take 10 mg by mouth daily.   12/23/2021 at 0930   losartan (COZAAR) 25 MG tablet Take 25 mg by mouth daily.   12/23/2021 at 0930   metFORMIN (GLUCOPHAGE-XR) 500 MG 24 hr tablet Take 500 mg by mouth 2 (two) times daily.   12/23/2021 at 0930   metoprolol tartrate (LOPRESSOR) 25 MG tablet Take 50 mg by mouth 2 (two) times daily.   12/23/2021 at 0930   Multiple Vitamin (MULTIVITAMIN) capsule Take 1 capsule by mouth daily.   12/23/2021 at 0930   omega-3 acid ethyl esters (LOVAZA) 1 g capsule Take 1 g by mouth 2 (two) times daily.   12/23/2021 at 0930   omeprazole (PRILOSEC) 20 MG capsule Take 20 mg by mouth 2 (two) times daily before a meal.   12/23/2021 at 0930   ondansetron (ZOFRAN-ODT) 4 MG disintegrating tablet Take 4 mg by mouth 3 (three) times daily as needed for nausea.   prn at prn   venlafaxine (EFFEXOR) 75 MG tablet Take 75 mg by mouth 2 (two) times daily with a meal.   12/23/2021 at 0930   albuterol (VENTOLIN HFA) 108 (90 Base) MCG/ACT inhaler SMARTSIG:2 Inhalation Via Inhaler Every 4 Hours PRN   prn at prn   azithromycin (ZITHROMAX) 250 MG tablet Take daily as directed (Patient not taking: Reported on 12/23/2021) 4 each 0 Completed Course    Social History   Socioeconomic History   Marital status: Widowed     Spouse name: Not on file   Number of children: Not on file   Years of education: Not on file   Highest education level: Not on file  Occupational History   Not on file  Tobacco Use   Smoking status: Every Day    Packs/day: 1.00    Types: Cigarettes   Smokeless tobacco: Never  Vaping Use   Vaping Use: Never used  Substance  and Sexual Activity   Alcohol use: No   Drug use: No   Sexual activity: Not on file  Other Topics Concern   Not on file  Social History Narrative   Not on file   Social Determinants of Health   Financial Resource Strain: Not on file  Food Insecurity: No Food Insecurity (12/24/2021)   Hunger Vital Sign    Worried About Running Out of Food in the Last Year: Never true    Ran Out of Food in the Last Year: Never true  Transportation Needs: No Transportation Needs (12/24/2021)   PRAPARE - Hydrologist (Medical): No    Lack of Transportation (Non-Medical): No  Physical Activity: Not on file  Stress: Not on file  Social Connections: Not on file  Intimate Partner Violence: Not At Risk (12/24/2021)   Humiliation, Afraid, Rape, and Kick questionnaire    Fear of Current or Ex-Partner: No    Emotionally Abused: No    Physically Abused: No    Sexually Abused: No    Family History  Problem Relation Age of Onset   Hypertension Mother    Hypertension Father    Diabetes Sister    Cancer Brother    Diabetes Brother    Breast cancer Neg Hx       PHYSICAL EXAM General: Pleasant elderly and frail-appearing Caucasian female, in no acute distress.  Sitting upright in PCU bed, no family at bedside.  HEENT:  Normocephalic and atraumatic. Neck:  No JVD.  Lungs: Normal respiratory effort on oxygen by nasal cannula.  Trace bibasilar crackles  Heart: regular rate and rhythm. Normal S1 and S2 without gallops or murmurs.  Abdomen: nondistended appearing Msk: Normal strength and tone for age. Extremities: Warm and well perfused. No clubbing,  cyanosis.  No peripheral edema.  Neuro: Alert and oriented X 3. Psych:  Answers questions appropriately.   Labs: Basic Metabolic Panel: Recent Labs    12/23/21 2206 12/24/21 0456 12/25/21 0410  NA  --  141 137  K  --  2.9* 3.4*  CL  --  99 102  CO2  --  29 26  GLUCOSE  --  118* 87  BUN  --  25* 23  CREATININE  --  1.34* 1.08*  CALCIUM  --  9.3 9.3  MG 2.2 2.0  --     Liver Function Tests: Recent Labs    12/23/21 2004  AST 26  ALT 41  ALKPHOS 53  BILITOT 1.0  PROT 6.7  ALBUMIN 3.7    Recent Labs    12/23/21 2004  LIPASE 24    CBC: Recent Labs    12/23/21 2004 12/24/21 0456 12/25/21 0410  WBC 12.2* 12.7* 8.3  NEUTROABS 10.1*  --   --   HGB 12.1 11.3* 11.3*  HCT 38.9 36.0 35.5*  MCV 98.2 96.8 97.5  PLT 238 199 166    Cardiac Enzymes: Recent Labs    12/23/21 2004 12/23/21 2206  TROPONINIHS 35* 38*    BNP: Invalid input(s): "POCBNP" D-Dimer: No results for input(s): "DDIMER" in the last 72 hours. Hemoglobin A1C: Recent Labs    12/24/21 0456  HGBA1C 6.1*   Fasting Lipid Panel: No results for input(s): "CHOL", "HDL", "LDLCALC", "TRIG", "CHOLHDL", "LDLDIRECT" in the last 72 hours. Thyroid Function Tests: Recent Labs    12/23/21 2004  TSH 4.923*    Anemia Panel: No results for input(s): "VITAMINB12", "FOLATE", "FERRITIN", "TIBC", "IRON", "RETICCTPCT" in the last 72 hours.  CT HEAD WO CONTRAST (5MM)  Result Date: 12/23/2021 CLINICAL DATA:  Sudden severe headache. General malaise for several days. EKG with atrial fibrillation with RVR. EXAM: CT HEAD WITHOUT CONTRAST TECHNIQUE: Contiguous axial images were obtained from the base of the skull through the vertex without intravenous contrast. RADIATION DOSE REDUCTION: This exam was performed according to the departmental dose-optimization program which includes automated exposure control, adjustment of the mA and/or kV according to patient size and/or use of iterative reconstruction technique.  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 No new asymmetry is seen concerning for an acute infarct, hemorrhage or mass. There is no midline shift. Basal cisterns are clear. Vascular: There are patchy calcifications in the carotid siphons but no hyperdense central vessels. Skull: Negative for fractures or focal lesions. Sinuses/Orbits: No acute findings. No mastoid effusions. Old lens extractions. Other: None. IMPRESSION: No acute intracranial CT findings or interval changes. Chronic change. Electronically Signed   By: Telford Nab M.D.   On: 12/23/2021 21:37   CT ABDOMEN PELVIS W CONTRAST  Result Date: 12/23/2021 CLINICAL DATA:  Abdominal pain and fatigue.  Generalized malaise. EXAM: CT ABDOMEN AND PELVIS WITH CONTRAST TECHNIQUE: Multidetector CT imaging of the abdomen and pelvis was performed using the standard protocol following bolus administration of intravenous contrast. RADIATION DOSE REDUCTION: This exam was performed according to the departmental dose-optimization program which includes automated exposure control, adjustment of the mA and/or kV according to patient size and/or use of iterative reconstruction technique. CONTRAST:  80m OMNIPAQUE IOHEXOL 300 MG/ML  SOLN COMPARISON:  CT abdomen pelvis dated 06/12/2012. FINDINGS: Lower chest: Partially visualized small bilateral pleural effusions with partial compressive atelectasis of the lower lobes. Pneumonia is not excluded. Cardiac pacemaker wires noted. No intra-abdominal free air.  Trace free fluid within the pelvis. Hepatobiliary: Fatty liver. No biliary dilatation. Cholecystectomy. No retained calcified stone noted in the central CBD. Pancreas: Unremarkable. No pancreatic ductal dilatation or surrounding inflammatory changes. Spleen: Normal in size without focal  abnormality. Adrenals/Urinary Tract: Bilateral adrenal thickening/hyperplasia or adenoma similar to prior CT. There is no hydronephrosis on either side. The visualized ureters and urinary bladder appear unremarkable. Stomach/Bowel: There is moderate stool throughout the colon. There is no bowel obstruction or active inflammation. The appendix is not visualized with certainty. No inflammatory changes identified in the right lower quadrant. Vascular/Lymphatic: Advanced aortoiliac atherosclerotic disease. Bilateral common iliac artery stent. The stents remain patent. There is a 3 cm infrarenal abdominal aortic aneurysm. The IVC is unremarkable. No portal venous gas. There is no adenopathy. Reproductive: Hysterectomy.  No adnexal masses. Other: None Musculoskeletal: No acute or significant osseous findings. IMPRESSION: 1. No acute intra-abdominal or pelvic pathology. 2. Moderate colonic stool burden. No bowel obstruction. 3. Partially visualized small bilateral pleural effusions with partial compressive atelectasis of the lower lobes. Pneumonia is not excluded. 4. Aortic Atherosclerosis (ICD10-I70.0). Electronically Signed   By: AAnner CreteM.D.   On: 12/23/2021 21:34   DG Chest Portable 1 View  Result Date: 12/23/2021 CLINICAL DATA:  Shortness of breath, fatigue and malaise. EXAM: PORTABLE CHEST 1 VIEW COMPARISON:  Portable chest 12/12/21 FINDINGS: Left chest pacemaker/defibrillator and wiring are unchanged in appearance as well as CABG changes. The heart has mildly enlarged compared to the recent study. There is aortic atherosclerosis with stable mediastinum. There is mild perihilar vascular congestion, mild interstitial edema in the lower lung fields  with small pleural effusions. The interstitial edema has improved in that it has regressed from the upper lung fields since prior study. No focal pneumonia is seen. In all other respects no further changes. Osteopenia and thoracic spondylosis. IMPRESSION:  Cardiomegaly with perihilar vascular congestion and mild lower zonal interstitial edema, with small pleural effusions. No acute airspace infiltrate. Similar but interval improved findings on the last study. Electronically Signed   By: Telford Nab M.D.   On: 12/23/2021 20:20     Radiology: CT HEAD WO CONTRAST (5MM)  Result Date: 12/23/2021 CLINICAL DATA:  Sudden severe headache. General malaise for several days. EKG with atrial fibrillation with RVR. EXAM: CT HEAD WITHOUT CONTRAST TECHNIQUE: Contiguous axial images were obtained from the base of the skull through the vertex without intravenous contrast. RADIATION DOSE REDUCTION: This exam was performed according to the departmental dose-optimization program which includes automated exposure control, adjustment of the mA and/or kV according to patient size and/or use of iterative reconstruction technique. 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 No new asymmetry is seen concerning for an acute infarct, hemorrhage or mass. There is no midline shift. Basal cisterns are clear. Vascular: There are patchy calcifications in the carotid siphons but no hyperdense central vessels. Skull: Negative for fractures or focal lesions. Sinuses/Orbits: No acute findings. No mastoid effusions. Old lens extractions. Other: None. IMPRESSION: No acute intracranial CT findings or interval changes. Chronic change. Electronically Signed   By: Telford Nab M.D.   On: 12/23/2021 21:37   CT ABDOMEN PELVIS W CONTRAST  Result Date: 12/23/2021 CLINICAL DATA:  Abdominal pain and fatigue.  Generalized malaise. EXAM: CT ABDOMEN AND PELVIS WITH CONTRAST TECHNIQUE: Multidetector CT imaging of the abdomen and pelvis was performed using the standard protocol following bolus administration of intravenous  contrast. RADIATION DOSE REDUCTION: This exam was performed according to the departmental dose-optimization program which includes automated exposure control, adjustment of the mA and/or kV according to patient size and/or use of iterative reconstruction technique. CONTRAST:  35m OMNIPAQUE IOHEXOL 300 MG/ML  SOLN COMPARISON:  CT abdomen pelvis dated 06/12/2012. FINDINGS: Lower chest: Partially visualized small bilateral pleural effusions with partial compressive atelectasis of the lower lobes. Pneumonia is not excluded. Cardiac pacemaker wires noted. No intra-abdominal free air.  Trace free fluid within the pelvis. Hepatobiliary: Fatty liver. No biliary dilatation. Cholecystectomy. No retained calcified stone noted in the central CBD. Pancreas: Unremarkable. No pancreatic ductal dilatation or surrounding inflammatory changes. Spleen: Normal in size without focal abnormality. Adrenals/Urinary Tract: Bilateral adrenal thickening/hyperplasia or adenoma similar to prior CT. There is no hydronephrosis on either side. The visualized ureters and urinary bladder appear unremarkable. Stomach/Bowel: There is moderate stool throughout the colon. There is no bowel obstruction or active inflammation. The appendix is not visualized with certainty. No inflammatory changes identified in the right lower quadrant. Vascular/Lymphatic: Advanced aortoiliac atherosclerotic disease. Bilateral common iliac artery stent. The stents remain patent. There is a 3 cm infrarenal abdominal aortic aneurysm. The IVC is unremarkable. No portal venous gas. There is no adenopathy. Reproductive: Hysterectomy.  No adnexal masses. Other: None Musculoskeletal: No acute or significant osseous findings. IMPRESSION: 1. No acute intra-abdominal or pelvic pathology. 2. Moderate colonic stool burden. No bowel obstruction. 3. Partially visualized small bilateral pleural effusions with partial compressive atelectasis of the lower lobes. Pneumonia is not excluded.  4. Aortic Atherosclerosis (ICD10-I70.0). Electronically Signed  By: Anner Crete M.D.   On: 12/23/2021 21:34   DG Chest Portable 1 View  Result Date: 12/23/2021 CLINICAL DATA:  Shortness of breath, fatigue and malaise. EXAM: PORTABLE CHEST 1 VIEW COMPARISON:  Portable chest 12/12/21 FINDINGS: Left chest pacemaker/defibrillator and wiring are unchanged in appearance as well as CABG changes. The heart has mildly enlarged compared to the recent study. There is aortic atherosclerosis with stable mediastinum. There is mild perihilar vascular congestion, mild interstitial edema in the lower lung fields with small pleural effusions. The interstitial edema has improved in that it has regressed from the upper lung fields since prior study. No focal pneumonia is seen. In all other respects no further changes. Osteopenia and thoracic spondylosis. IMPRESSION: Cardiomegaly with perihilar vascular congestion and mild lower zonal interstitial edema, with small pleural effusions. No acute airspace infiltrate. Similar but interval improved findings on the last study. Electronically Signed   By: Telford Nab M.D.   On: 12/23/2021 20:20   DG Chest Port 1 View  Result Date: 12/12/2021 CLINICAL DATA:  Dyspnea EXAM: PORTABLE CHEST 1 VIEW COMPARISON:  12/10/2021 FINDINGS: The lungs are hyperinflated in keeping with changes of underlying COPD, stable since prior examination. Superimposed peripheral bibasilar interstitial opacities are present most in keeping with mild interstitial pulmonary edema. This appears stable since prior examination. No pneumothorax or pleural effusion. Coronary artery bypass grafting has been performed. Cardiac size is within normal limits. Left subclavian pacemaker defibrillator is unchanged. No acute bone abnormality. IMPRESSION: 1. Stable mild interstitial pulmonary edema. 2. COPD. Electronically Signed   By: Fidela Salisbury M.D.   On: 12/12/2021 02:23   ECHOCARDIOGRAM COMPLETE  Result Date:  12/11/2021    ECHOCARDIOGRAM REPORT   Patient Name:   Christine Obrien Date of Exam: 12/10/2021 Medical Rec #:  [id number]   Height: Accession #:    [id number]  Weight: Date of Birth:  November 19, 1940   BSA: Patient Age:    42 years    BP:           83/61 mmHg Patient Gender: F           HR:           79 bpm. Exam Location:  [facility] Procedure: 2D Echo, Cardiac Doppler and Color Doppler Indications:     Acute respiratory distress R06.03  History:         Patient has no prior history of Echocardiogram examinations.                  CHF, CAD and Previous Myocardial Infarction, Prior CABG,                  Pacemaker and Endarterectomy, COPD, Arrythmias:LBBB; Risk                  Factors:Current Smoker, Diabetes, Hypertension and                  Dyslipidemia.  Sonographer:     Rosalia Hammers Referring Phys:  [place] Diagnosing Phys: Yolonda Kida MD  Sonographer Comments: Image quality was good. IMPRESSIONS  1. Left ventricular ejection fraction, by estimation, is 70 to 75%. The left ventricle has hyperdynamic function. The left ventricle has no regional wall motion abnormalities. There is mild left ventricular hypertrophy. Left ventricular diastolic parameters are consistent with Grade II diastolic dysfunction (pseudonormalization).  2. Right ventricular systolic function is normal. The right ventricular size is normal.  3. The mitral valve is normal in  structure. Trivial mitral valve regurgitation.  4. The aortic valve is normal in structure. Aortic valve regurgitation is trivial. FINDINGS  Left Ventricle: Left ventricular ejection fraction, by estimation, is 70 to 75%. The left ventricle has hyperdynamic function. The left ventricle has no regional wall motion abnormalities. The left ventricular internal cavity size was normal in size. There is mild left ventricular hypertrophy. Left ventricular diastolic parameters are consistent with Grade II diastolic dysfunction (pseudonormalization). Right Ventricle: The right  ventricular size is normal. No increase in right ventricular wall thickness. Right ventricular systolic function is normal. Left Atrium: Left atrial size was normal in size. Right Atrium: Right atrial size was normal in size. Pericardium: There is no evidence of pericardial effusion. Mitral Valve: The mitral valve is normal in structure. Trivial mitral valve regurgitation. Tricuspid Valve: The tricuspid valve is normal in structure. Tricuspid valve regurgitation is mild. Aortic Valve: The aortic valve is normal in structure. Aortic valve regurgitation is trivial. Aortic valve mean gradient measures 7.0 mmHg. Aortic valve peak gradient measures 12.8 mmHg. Aortic valve area, by VTI measures 1.29 cm. Pulmonic Valve: The pulmonic valve was normal in structure. Pulmonic valve regurgitation is not visualized. Aorta: The ascending aorta was not well visualized. IAS/Shunts: No atrial level shunt detected by color flow Doppler.  LEFT VENTRICLE PLAX 2D LVIDd:         4.35 cm   Diastology LVIDs:         2.62 cm   LV e' medial:    4.35 cm/s LV PW:         1.40 cm   LV E/e' medial:  37.7 LV IVS:        1.28 cm   LV e' lateral:   4.90 cm/s LVOT diam:     1.60 cm   LV E/e' lateral: 33.5 LV SV:         49 LVOT Area:     2.01 cm  RIGHT VENTRICLE RV Basal diam:  2.82 cm RV Mid diam:    3.12 cm RV S prime:     13.20 cm/s TAPSE (M-mode): 2.3 cm LEFT ATRIUM             RIGHT ATRIUM LA diam:        3.50 cm RA Area:     16.00 cm LA Vol (A2C):   43.5 ml RA Volume:   41.50 ml LA Vol (A4C):   38.7 ml LA Biplane Vol: 41.8 ml  AORTIC VALVE AV Area (Vmax):    1.21 cm AV Area (Vmean):   1.22 cm AV Area (VTI):     1.29 cm AV Vmax:           179.00 cm/s AV Vmean:          129.000 cm/s AV VTI:            0.381 m AV Peak Grad:      12.8 mmHg AV Mean Grad:      7.0 mmHg LVOT Vmax:         108.00 cm/s LVOT Vmean:        78.400 cm/s LVOT VTI:          0.244 m LVOT/AV VTI ratio: 0.64  AORTA Ao Root diam: 2.70 cm MITRAL VALVE                 TRICUSPID VALVE MV Area (PHT): 3.42 cm     TR Peak grad:   49.0 mmHg MV Decel Time: 222 msec  TR Vmax:        350.00 cm/s MV E velocity: 164.00 cm/s MV A velocity: 160.00 cm/s  SHUNTS MV E/A ratio:  1.02         Systemic VTI:  0.24 m                             Systemic Diam: 1.60 cm Yolonda Kida MD Electronically signed by Yolonda Kida MD Signature Date/Time: 12/11/2021/5:30:23 PM    Final    DG Chest 2 View  Result Date: 12/10/2021 CLINICAL DATA:  Shortness of breath for 1 day. EXAM: CHEST - 2 VIEW COMPARISON:  07/21/2020 FINDINGS: UPPER limits normal heart size, CABG changes and LEFT-sided pacemaker/AICD again noted. Mild interstitial opacities and trace bilateral pleural effusions are noted. There is no evidence of pneumothorax or acute bony abnormality. IMPRESSION: Mild interstitial opacities and trace bilateral pleural, favor mild pulmonary edema. Electronically Signed   By: Margarette Canada M.D.   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 Cardiology is consulted for further assistance.    #Generalized weakness #Constipation vs GI illness The patient presents with generalized weakness and fatigue since her hospital discharge on 8/25.  She initially was eating okay, several meals a day without difficulty, but over the past few days she  has been nauseous and dry heaving, and a couple episodes of vomiting up liquid.  Her daughter notes she sometimes has diarrhea and constipation, most recently having 4-6 small, stringy bowel movements per day and has not had a well-formed stool in 4 to 5 days.  Possibly some component of deconditioning vs poor PO intake. -Management per primary team.  #Paroxysmal atrial fibrillation with RVR #Chronic LBBB Noted during her outpatient cardiology visit on 8/31 to be in atrial fibrillation with heart rates in the 130s, metoprolol was increased to 50 mg twice daily.  She was in A-fib with RVR on presentation to the ED with heart rates in the 100s to 120s on presentation, started on a diltiazem drip with improvement.  Converted to sinus rhythm after administration of p.o. metoprolol 25 mg, heart rate in the 60s -s/p diltiazem drip -consolidate metoprolol to '50mg'$  BID  -Continue Eliquis 5 mg twice daily, monitor weight at next outpatient cardiology visit to determine if she meets criteria for dose reduced Eliquis based on her weight and age. -Continuous monitoring on telemetry  #Acute on chronic HF with recovered EF (prev 35%, now 70% with g2dd) s/p CRT-D  During her last hospitalization she received significant IV fluids, was having orthopnea and PND, improved with IV Lasix.  She was discharged on 20 mg of p.o. Lasix daily.  She currently denies any orthopnea, PND (actually notes she can breathe better when lying down) and does not appear clinically grossly volume overloaded  on exam, although her BNP is elevated at 2300.  Decompensation can possibly be attributed to her A-fib and rapid heart rate as above. -S/p 40 mg IV Lasix x2 on 9/6 -give another '40mg'$  IV  lasix today  -Continue GDMT with metoprolol. Hold losartan and Jardiance due to AKI as below, restart at discharge -Monitor and replete electrolytes for goal K >4, mag >2  #AKI Creatinine on admission 1.45 and GFR 36, somewhat improving to 1.08 and  GFR 52 today. Baseline from discharge on 8/25 was 0.94 and greater than 60.  This patient's plan of care was discussed and created with Dr. Nehemiah Massed and he is in agreement.  Signed: Tristan Schroeder , PA-C 12/25/2021, 11:11 AM [facility]  The patient has done well overnight with spontaneous conversion to normal sinus rhythm.  Currently she has normal sinus rhythm with ventricular pacing.  There has been no evidence of chest discomfort or myocardial infarction.  She does have some basilar crackles consistent with acute on chronic systolic dysfunction congestive heart failure for which we will continue medication management including diuresis.  She otherwise has ambulated well and is okay for discharged home from cardiac standpoint  The patient has been interviewed and examined. I agree with assessment and plan above. Serafina Royals MD [facility]

## 2021-12-25 NOTE — Discharge Instructions (Signed)

## 2021-12-25 NOTE — Progress Notes (Signed)
Mobility Specialist - Progress Note   12/25/21 1445  Mobility  Activity Ambulated with assistance in hallway;Stood at bedside;Transferred to/from BSC;Dangled on edge of bed  Level of Assistance Standby assist, set-up cues, supervision of patient - no hands on  Assistive Device Front wheel walker  Distance Ambulated (ft) 100 ft  Activity Response Tolerated well  $Mobility charge 1 Mobility   Pt supine in bed on RA upon arrival. Pt STS and transfers to [facility] indep. Pt ambulates in hallway SBA. (No hands on). Pt returns to bed with needs in reach, alarm unable to set d/t bed on reserve battery. RN front desk notified.   Gretchen Short  Mobility Specialist  12/25/21 2:51 PM

## 2021-12-25 NOTE — Progress Notes (Addendum)
PROGRESS NOTE    Christine Obrien  WHQ:[id number] DOB: 05/26/1940 DOA: 12/23/2021 PCP: [name], MD    Assessment & Plan:   Principal Problem:   Rapid atrial fibrillation [facility]) Active Problems:   Acute on chronic systolic CHF with improved EF (congestive heart failure) (HCC)   Elevated troponin   AKI (acute kidney injury) ([place])   Coronary artery disease involving native heart   Essential hypertension   Hypothyroidism   Presence of stent in coronary artery   S/P CABG x 2   Type 2 diabetes mellitus without complications ([place])   ICD (implantable cardioverter-defibrillator) in place  Assessment and Plan: PAF: w/ RVR. S/p diltiazem drip. Continue on metoprolol, eliquis. Continue on tele. Cardio following and recs apprec    Acute on chronic systolic CHF: with improved EF. S/p AICD.  Last echo from 12/10/2021 with EF 70 to 75% and G2 DD.  EF in 2020 was 30%. Continue on IV lasix. Continue on metoprolol and hold losartan secondary to AKI. Can restart losartan at d/c as per cardio  AKI: Cr is trending down from day prior. Holding home dose of losartan. Avoid nephrotoxic meds    DM2: well controlled, HbA1c 6.1. Continue on SSI w/ accuchecks   Hypokalemia: potassium given    Hypothyroidism: continue on levothryoxine    HTN: continue on metoprolol. Holding losartan but can restart at d/c    Hx of CAD: s/p CABG in 2019. W/ elevated troponins, likely secondary to demand ischemia. Continue on aspirin, metoprolol. Holding losartan but can restart at d/c    Generalized weakness: PT/OT consulted       DVT prophylaxis: eliquis Code Status: full  Family Communication:  Disposition Plan: likely d/c back home   Level of care: Progressive  Status is: Inpatient Remains inpatient appropriate because: severity of illness    Consultants:  Cardio   Procedures:   Antimicrobials:   Subjective: Pt c/o fatigue   Objective: Vitals:   12/25/21 0433 12/25/21 0500 12/25/21 0751  12/25/21 0801  BP: (!) 125/57  (!) 133/47   Pulse: 60  61   Resp: 18  18   Temp: 98.4 F (36.9 C)  98 F (36.7 C)   TempSrc: Oral  Oral   SpO2: 100%  92% 94%  Weight:  57.2 kg      Intake/Output Summary (Last 24 hours) at 12/25/2021 0927 Last data filed at 12/25/2021 0600 Gross per 24 hour  Intake 120 ml  Output 150 ml  Net -30 ml   Filed Weights   12/23/21 2006 12/25/21 0500  Weight: 59.4 kg 57.2 kg    Examination:  General exam: Appears calm and comfortable  Respiratory system: Clear to auscultation. Respiratory effort normal. Cardiovascular system: irregularly irregular. No rubs, gallops or clicks.  Gastrointestinal system: Abdomen is nondistended, soft and nontender. Normal bowel sounds heard. Central nervous system: Alert and oriented. Moves all extremities  Psychiatry: Judgement and insight appear normal. Flat mood and affect     Data Reviewed: I have personally reviewed following labs and imaging studies  CBC: Recent Labs  Lab 12/23/21 2004 12/24/21 0456 12/25/21 0410  WBC 12.2* 12.7* 8.3  NEUTROABS 10.1*  --   --   HGB 12.1 11.3* 11.3*  HCT 38.9 36.0 35.5*  MCV 98.2 96.8 97.5  PLT 238 199 659   Basic Metabolic Panel: Recent Labs  Lab 12/23/21 2004 12/23/21 2206 12/24/21 0456 12/25/21 0410  NA 139  --  141 137  K 3.6  --  2.9*  3.4*  CL 101  --  99 102  CO2 27  --  29 26  GLUCOSE 152*  --  118* 87  BUN 27*  --  25* 23  CREATININE 1.45*  --  1.34* 1.08*  CALCIUM 9.5  --  9.3 9.3  MG  --  2.2 2.0  --    GFR: Estimated Creatinine Clearance: 34.4 mL/min (A) (by C-G formula based on SCr of 1.08 mg/dL (H)). Liver Function Tests: Recent Labs  Lab 12/23/21 2004  AST 26  ALT 41  ALKPHOS 53  BILITOT 1.0  PROT 6.7  ALBUMIN 3.7   Recent Labs  Lab 12/23/21 2004  LIPASE 24   No results for input(s): "AMMONIA" in the last 168 hours. Coagulation Profile: No results for input(s): "INR", "PROTIME" in the last 168 hours. Cardiac Enzymes: No  results for input(s): "CKTOTAL", "CKMB", "CKMBINDEX", "TROPONINI" in the last 168 hours. BNP (last 3 results) No results for input(s): "PROBNP" in the last 8760 hours. HbA1C: Recent Labs    12/24/21 0456  HGBA1C 6.1*   CBG: Recent Labs  Lab 12/24/21 1230 12/24/21 1418 12/24/21 1630 12/24/21 2158 12/25/21 0921  GLUCAP 159* 158* 133* 121* 89   Lipid Profile: No results for input(s): "CHOL", "HDL", "LDLCALC", "TRIG", "CHOLHDL", "LDLDIRECT" in the last 72 hours. Thyroid Function Tests: Recent Labs    12/23/21 2004  TSH 4.923*  FREET4 0.89   Anemia Panel: No results for input(s): "VITAMINB12", "FOLATE", "FERRITIN", "TIBC", "IRON", "RETICCTPCT" in the last 72 hours. Sepsis Labs: Recent Labs  Lab 12/23/21 2004 12/24/21 0456 12/25/21 0410  PROCALCITON <0.10 <0.10 <0.10    Recent Results (from the past 240 hour(s))  SARS Coronavirus 2 by RT PCR (hospital order, performed in [facility] hospital lab) *cepheid single result test* Anterior Nasal Swab     Status: None   Collection Time: 12/23/21  8:05 PM   Specimen: Anterior Nasal Swab  Result Value Ref Range Status   SARS Coronavirus 2 by RT PCR NEGATIVE NEGATIVE Final    Comment: (NOTE) SARS-CoV-2 target nucleic acids are NOT DETECTED.  The SARS-CoV-2 RNA is generally detectable in upper and lower respiratory specimens during the acute phase of infection. The lowest concentration of SARS-CoV-2 viral copies this assay can detect is 250 copies / mL. A negative result does not preclude SARS-CoV-2 infection and should not be used as the sole basis for treatment or other patient management decisions.  A negative result may occur with improper specimen collection / handling, submission of specimen other than nasopharyngeal swab, presence of viral mutation(s) within the areas targeted by this assay, and inadequate number of viral copies (<250 copies / mL). A negative result must be combined with clinical observations, patient  history, and epidemiological information.  Fact Sheet for Patients:   https://www.patel.info/  Fact Sheet for Healthcare Providers: https://hall.com/  This test is not yet approved or  cleared by the [place] FDA and has been authorized for detection and/or diagnosis of SARS-CoV-2 by FDA under an Emergency Use Authorization (EUA).  This EUA will remain in effect (meaning this test can be used) for the duration of the COVID-19 declaration under Section 564(b)(1) of the Act, 21 U.S.C. section 360bbb-3(b)(1), unless the authorization is terminated or revoked sooner.  Performed at [facility], [address], [place], [place] [address]          Radiology Studies: CT HEAD WO CONTRAST (5MM)  Result Date: 12/23/2021 CLINICAL DATA:  Sudden severe headache. General malaise for  several days. EKG with atrial fibrillation with RVR. EXAM: CT HEAD WITHOUT CONTRAST TECHNIQUE: Contiguous axial images were obtained from the base of the skull through the vertex without intravenous contrast. RADIATION DOSE REDUCTION: This exam was performed according to the departmental dose-optimization program which includes automated exposure control, adjustment of the mA and/or kV according to patient size and/or use of iterative reconstruction technique. 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 No new asymmetry is seen concerning for an acute infarct, hemorrhage or mass. There is no midline shift. Basal cisterns are clear. Vascular: There are patchy calcifications in the carotid siphons but no hyperdense central vessels. Skull: Negative for fractures or focal lesions. Sinuses/Orbits: No acute findings. No mastoid effusions. Old lens extractions. Other: None. IMPRESSION: No  acute intracranial CT findings or interval changes. Chronic change. Electronically Signed   By: Telford Nab M.D.   On: 12/23/2021 21:37   CT ABDOMEN PELVIS W CONTRAST  Result Date: 12/23/2021 CLINICAL DATA:  Abdominal pain and fatigue.  Generalized malaise. EXAM: CT ABDOMEN AND PELVIS WITH CONTRAST TECHNIQUE: Multidetector CT imaging of the abdomen and pelvis was performed using the standard protocol following bolus administration of intravenous contrast. RADIATION DOSE REDUCTION: This exam was performed according to the departmental dose-optimization program which includes automated exposure control, adjustment of the mA and/or kV according to patient size and/or use of iterative reconstruction technique. CONTRAST:  44m OMNIPAQUE IOHEXOL 300 MG/ML  SOLN COMPARISON:  CT abdomen pelvis dated 06/12/2012. FINDINGS: Lower chest: Partially visualized small bilateral pleural effusions with partial compressive atelectasis of the lower lobes. Pneumonia is not excluded. Cardiac pacemaker wires noted. No intra-abdominal free air.  Trace free fluid within the pelvis. Hepatobiliary: Fatty liver. No biliary dilatation. Cholecystectomy. No retained calcified stone noted in the central CBD. Pancreas: Unremarkable. No pancreatic ductal dilatation or surrounding inflammatory changes. Spleen: Normal in size without focal abnormality. Adrenals/Urinary Tract: Bilateral adrenal thickening/hyperplasia or adenoma similar to prior CT. There is no hydronephrosis on either side. The visualized ureters and urinary bladder appear unremarkable. Stomach/Bowel: There is moderate stool throughout the colon. There is no bowel obstruction or active inflammation. The appendix is not visualized with certainty. No inflammatory changes identified in the right lower quadrant. Vascular/Lymphatic: Advanced aortoiliac atherosclerotic disease. Bilateral common iliac artery stent. The stents remain patent. 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 The IVC is unremarkable. No portal venous gas. There is no adenopathy. Reproductive: Hysterectomy.  No adnexal masses. Other: None Musculoskeletal: No acute or significant osseous findings. IMPRESSION: 1. No acute intra-abdominal or pelvic pathology. 2. Moderate colonic stool burden. No bowel obstruction. 3. Partially visualized small bilateral pleural effusions with partial compressive atelectasis of the lower lobes. Pneumonia is not excluded. 4. Aortic Atherosclerosis (ICD10-I70.0). Electronically Signed   By: AAnner CreteM.D.   On: 12/23/2021 21:34   DG Chest Portable 1 View  Result Date: 12/23/2021 CLINICAL DATA:  Shortness of breath, fatigue and malaise. EXAM: PORTABLE CHEST 1 VIEW COMPARISON:  Portable chest 12/12/21 FINDINGS: Left chest pacemaker/defibrillator and wiring are unchanged in appearance as well as CABG changes. The heart has mildly enlarged compared to the recent study. There is aortic atherosclerosis with stable mediastinum. There is mild perihilar vascular congestion, mild interstitial edema in the lower lung fields with small pleural effusions. The interstitial edema has improved in that it has regressed from the upper lung  fields since prior study. No focal pneumonia is seen. In all other respects no further changes. Osteopenia and thoracic spondylosis. IMPRESSION: Cardiomegaly with perihilar vascular congestion and mild lower zonal interstitial edema, with small pleural effusions. No acute airspace infiltrate. Similar but interval improved findings on the last study. Electronically Signed   By: Telford Nab M.D.   On: 12/23/2021 20:20        Scheduled Meds:  acetaminophen  500 mg Oral Once   apixaban  5 mg Oral BID   aspirin EC  81 mg Oral Daily   buPROPion ER  100 mg Oral BID   icosapent Ethyl  1 g Oral BID WC   insulin aspart  0-15 Units Subcutaneous TID WC   insulin aspart  0-5 Units Subcutaneous QHS   ipratropium-albuterol  3 mL Nebulization BID    levothyroxine  137 mcg Oral Q0600   lidocaine  1 patch Transdermal Q24H   metoprolol tartrate  25 mg Oral TID   Continuous Infusions:   LOS: 2 days    Time spent: 35 mins     Wyvonnia Dusky, MD [facility] Pager [phone]  If 7PM-7AM, please contact night-coverage [email or web address] 12/25/2021, 9:27 AM

## 2021-12-26 DIAGNOSIS — I4891 Unspecified atrial fibrillation: Secondary | ICD-10-CM | POA: Diagnosis not present

## 2021-12-26 DIAGNOSIS — I5023 Acute on chronic systolic (congestive) heart failure: Secondary | ICD-10-CM | POA: Diagnosis not present

## 2021-12-26 DIAGNOSIS — N179 Acute kidney failure, unspecified: Secondary | ICD-10-CM | POA: Diagnosis not present

## 2021-12-26 LAB — BASIC METABOLIC PANEL
Anion gap: 8 (ref 5–15)
BUN: 20 mg/dL (ref 8–23)
CO2: 29 mmol/L (ref 22–32)
Calcium: 9.7 mg/dL (ref 8.9–10.3)
Chloride: 106 mmol/L (ref 98–111)
Creatinine, Ser: 1.11 mg/dL — ABNORMAL HIGH (ref 0.44–1.00)
GFR, Estimated: 50 mL/min — ABNORMAL LOW (ref >60)
Glucose, Bld: 125 mg/dL — ABNORMAL HIGH (ref 70–99)
Potassium: 4 mmol/L (ref 3.5–5.1)
Sodium: 143 mmol/L (ref 135–145)

## 2021-12-26 LAB — CBC
HCT: 37 % (ref 36.0–46.0)
Hemoglobin: 11.7 g/dL — ABNORMAL LOW (ref 12.0–15.0)
MCH: 31.6 pg (ref 26.0–34.0)
MCHC: 31.6 g/dL (ref 30.0–36.0)
MCV: 100 fL (ref 80.0–100.0)
Platelets: 165 10*3/uL (ref 150–400)
RBC: 3.7 MIL/uL — ABNORMAL LOW (ref 3.87–5.11)
RDW: 16.1 % — ABNORMAL HIGH (ref 11.5–15.5)
WBC: 9 10*3/uL (ref 4.0–10.5)
nRBC: 0 % (ref 0.0–0.2)

## 2021-12-26 LAB — GLUCOSE, CAPILLARY
Glucose-Capillary: 147 mg/dL — ABNORMAL HIGH (ref 70–99)
Glucose-Capillary: 162 mg/dL — ABNORMAL HIGH (ref 70–99)

## 2021-12-26 LAB — MAGNESIUM: Magnesium: 2.2 mg/dL (ref 1.7–2.4)

## 2021-12-26 NOTE — TOC Initial Note (Signed)
Transition of Care [facility]) - Initial/Assessment Note    Patient Details  Name: Christine Obrien MRN: [id number] Date of Birth: 1940/08/08  Transition of Care [facility]) CM/SW Contact:    Alberteen Sam, LCSW Phone Number: 12/26/2021, 11:00 AM  Clinical Narrative:                  CSW met with patient at bedside for [facility] PT recs, OT has signed off. Patient agreeable, reports no preference of [place] agency however she believes she has had [facility] in the past. CSW has sent referral to [place] with [facility]. Patient reports she continues to see Dr. [name] as PCO, goes to [facility] in [place] as her preferred pharmacy. Patietn has no dme needs and family will transport at discharge.     Expected Discharge Plan: [place] Barriers to Discharge: Continued Medical Work up   Patient Goals and CMS Choice Patient states their goals for this hospitalization and ongoing recovery are:: to go home CMS Medicare.gov Compare Post Acute Care list provided to:: Patient Choice offered to / list presented to : Patient  Expected Discharge Plan and Services Expected Discharge Plan: [place]       Living arrangements for the past 2 months: Single Family Home                           HH Arranged: PT HH Agency: [facility] ([place]) Date HH Agency Contacted: 12/26/21 Time HH Agency Contacted: 57 Representative spoke with at [facility]: Corene Cornea  Prior Living Arrangements/Services Living arrangements for the past 2 months: [place] with:: Adult Children   Do you feel safe going back to the place where you live?: Yes               Activities of Daily Living Home Assistive Devices/Equipment: Environmental consultant (specify type), Eyeglasses ADL Screening (condition at time of admission) Patient's cognitive ability adequate to safely complete daily activities?: Yes Is the patient deaf or have difficulty hearing?: No Does the patient have difficulty  seeing, even when wearing glasses/contacts?: No Does the patient have difficulty concentrating, remembering, or making decisions?: No Patient able to express need for assistance with ADLs?: Yes Does the patient have difficulty dressing or bathing?: No Independently performs ADLs?: Yes (appropriate for developmental age) Does the patient have difficulty walking or climbing stairs?: Yes Weakness of Legs: Both Weakness of Arms/Hands: Both  Permission Sought/Granted                  Emotional Assessment   Attitude/Demeanor/Rapport: Gracious Affect (typically observed): Calm Orientation: : Oriented to Self, Oriented to Place, Oriented to  Time, Oriented to Situation Alcohol / Substance Use: Not Applicable Psych Involvement: No (comment)  Admission diagnosis:  Rapid atrial fibrillation ([place]) [I48.91] Atrial fibrillation with rapid ventricular response ([place]) [I48.91] Acute on chronic congestive heart failure, unspecified heart failure type [facility]) [I50.9] Patient Active Problem List   Diagnosis Date Noted   Rapid atrial fibrillation ([place]) 12/23/2021   Elevated troponin 12/23/2021   AKI (acute kidney injury) ([place]) 12/23/2021   Acute on chronic respiratory failure with hypoxia (HCC)    COPD exacerbation ([place])    PAF (paroxysmal atrial fibrillation) ([place])    Acute hypoxemic respiratory failure ([place]) 12/10/2021   Carotid stenosis, right 05/08/2021   Pain in limb 04/23/2021   Acute CHF (congestive heart failure) ([place]) 21/19/4174   Acute systolic CHF (congestive heart  failure) ([place]) 07/21/2020   CKD stage 3 due to type 2 diabetes mellitus ([place]) 07/21/2020   Nicotine dependence 07/21/2020   ICD (implantable cardioverter-defibrillator) in place 11/02/2019   Acute on chronic systolic CHF with improved EF (congestive heart failure) ([place]) 02/14/2019   LBBB (left bundle branch block) 12/02/2018   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[place]) 02/12/2018   Subclavian steal syndrome  01/12/2018   Atherosclerotic peripheral vascular disease with intermittent claudication ([place]) 12/30/2017   Pain in both upper extremities 12/30/2017   Hyperlipidemia 12/30/2017   Coronary artery disease involving native heart 12/30/2017   Postoperative atrial fibrillation ([place]) 07/09/2017   S/P CABG x 2 07/09/2017   Stenosis of left subclavian artery ([place]) 07/07/2017   History of non-ST elevation myocardial infarction (NSTEMI) 07/06/2017   Presence of stent in coronary artery 07/06/2017   Chest pain 07/03/2017   NSTEMI (non-ST elevated myocardial infarction) ([place]) 07/03/2017   Impingement syndrome of right shoulder region 03/06/2017   Diabetes ([place]) 03/25/2016   Carotid stenosis 03/25/2016   Anxiety, generalized 08/24/2015   Gastroesophageal reflux disease without esophagitis 07/13/2015   DDD (degenerative disc disease), cervical 04/06/2015   Cervical radiculitis 04/06/2015   Type 2 diabetes mellitus without complications ([place]) 35/32/9924   Carotid bruit 09/15/2011   Symptoms involving cardiovascular system 09/15/2011   Essential hypertension 09/11/2011   Dysthymic disorder 09/11/2011   Hypothyroidism 09/11/2011   Major depressive disorder, recurrent episode, moderate ([place]) 09/11/2011   Dyslipidemia 03/18/2011   PCP:  [name], MD Pharmacy:   [facility] [place], [place] - [address] [facility] [address] [place] [address] Phone: [phone] Fax: [id number] [address], [place] [address] [place] [address] Phone: [phone] Fax: [phone]  [facility] [place], [place] [address] [place] [address] Phone: [phone] Fax: [phone]     Social Determinants of Health (SDOH) Interventions    Readmission Risk Interventions     No data to display

## 2021-12-26 NOTE — Progress Notes (Signed)
Spoke with patient and provided heart failure written and verbal education, including reviewing the "Living Better with Heart Failure" booklet, and maintaining low sodium diet and checking daily weight at home.  Patient reports she does not have a scale at home. Message sent to [facility], social worker, inquiring about obtaining a scale for patient before discharge. Patient given verbal and written instructions on follow up appointment with [facility]. She acknowledged and denied any issues with obtaining transport to appointment.  Georg Ruddle, RN

## 2021-12-26 NOTE — Evaluation (Signed)
Physical Therapy Evaluation Patient Details Name: Christine Obrien MRN: [id number] DOB: [birth year] Today's Date: 12/26/2021  History of Present Illness  Christine Obrien is an 26DGU with a PMH of CAD s/p CABG x2 in 06/2015 (LIMA to LAD, SVG to OM1), HF with recovered EF (70%, previously 35%, G2 DD) s/p CRT-D 04/2019, paroxysmal atrial fibrillation on Eliquis, COPD, hyperlipidemia, type 2 diabetes who presented to [facility] ED 12/23/2021 with generalized weakness and fatigue is in A-fib with RVR. Cardiology is consulted for further assistance.       Interval History:  -feels overall better, can't point out specifics  -remained in sinus / paced rhythm, rate controlled in the 60s.   -no further nausea, SOB, no chest pain or palpitations  Clinical Impression  Pt received in be agreeable to participate in therapy evaluation. Pt was independent at household level and community level activity participation.  Todays' PT assessment reveals pt SPO2 87% when at rest at RA but improve with activities and ambulation to 92%. With focus on PLB. Pt requires CGA to Sup with transfers and Ambulation with RW with CGA of 1. Pt demonstrates risk of fall evident by poor ability to perform SL standing, tandem standing. Pt will benefit from ambulation with RW. Pt's HR remained <100 through out the session. Pt will benefit form HHPT to return to PLOF safely after acute care.      Recommendations for follow up therapy are one component of a multi-disciplinary discharge planning process, led by the attending physician.  Recommendations may be updated based on patient status, additional functional criteria and insurance authorization.  Follow Up Recommendations Home health PT      Assistance Recommended at Discharge Set up Supervision/Assistance  Patient can return home with the following  A little help with walking and/or transfers;A little help with bathing/dressing/bathroom;Assistance with cooking/housework;Assist for transportation;Help with  stairs or ramp for entrance    Equipment Recommendations None recommended by PT  Recommendations for Other Services       Functional Status Assessment Patient has had a recent decline in their functional status and demonstrates the ability to make significant improvements in function in a reasonable and predictable amount of time.     Precautions / Restrictions Precautions Precautions: Fall Restrictions Weight Bearing Restrictions: No      Mobility  Bed Mobility Overal bed mobility: Modified Independent                  Transfers Overall transfer level: Needs assistance Equipment used: Rolling walker (2 wheels) Transfers: Sit to/from Stand, Bed to chair/wheelchair/BSC Sit to Stand: Supervision   Step pivot transfers: Supervision            Ambulation/Gait Ambulation/Gait assistance: Min guard Gait Distance (Feet): 180 Feet Assistive device: Rolling walker (2 wheels) Gait Pattern/deviations: Step-through pattern (mild unsteadiness with sways to L.)          Stairs : N/T            Wheelchair Mobility: N/A    Modified Rankin (Stroke Patients Only)       Balance Overall balance assessment: Needs assistance Sitting-balance support: Feet supported Sitting balance-Leahy Scale: Good     Standing balance support: Bilateral upper extremity supported Standing balance-Leahy Scale: Fair Standing balance comment: use of RW during mobility Single Leg Stance - Right Leg: 4 Single Leg Stance - Left Leg: 5 Tandem Stance - Right Leg: 7 Tandem Stance - Left Leg: 7 Rhomberg - Eyes Opened: 30 Rhomberg - Eyes Closed: 20  Pertinent Vitals/Pain Pain Assessment Pain Assessment: No/denies pain    Home Living Family/patient expects to be discharged to:: Private residence Living Arrangements: Children Available Help at Discharge: Family;Available 24 hours/day Type of Home: House Home Access: Stairs to enter   [employer] of Steps: 1 step   Home Layout: One level Home Equipment: Conservation officer, nature (2 wheels);Shower seat;Grab bars - tub/shower;BSC/3in1;Rollator (4 wheels)      Prior Function Prior Level of Function : Independent/Modified Independent             Mobility Comments: reports no recent falls. Pt is Ind with bathing, dressing, ambulation withoU AD at household and community level. Daughter 24 hours with pt. ADLs Comments: IND with all ADLs, not driving as much     Hand Dominance   Dominant Hand: Right    Extremity/Trunk Assessment   Upper Extremity Assessment Upper Extremity Assessment: Defer to OT evaluation    Lower Extremity Assessment Lower Extremity Assessment: Generalized weakness       Communication   Communication: No difficulties  Cognition Arousal/Alertness: Awake/alert Behavior During Therapy: WFL for tasks assessed/performed Overall Cognitive Status: Within Functional Limits for tasks assessed                                          General Comments      Exercises General Exercises - Lower Extremity Ankle Circles/Pumps: AROM, 10 reps Long Arc Quad: AROM, 10 reps, Seated Hip Flexion/Marching: AROM, 10 reps, Seated   Assessment/Plan    PT Assessment Patient needs continued PT services  PT Problem List         PT Treatment Interventions Gait training;Functional mobility training;Therapeutic activities;Therapeutic exercise;Balance training;Neuromuscular re-education;Patient/family education    PT Goals (Current goals can be found in the Care Plan section)  Acute Rehab PT Goals Patient Stated Goal: to get stronger PT Goal Formulation: With patient Time For Goal Achievement: 01/09/22 Potential to Achieve Goals: Good    Frequency Min 2X/week     Co-evaluation               AM-PAC PT "6 Clicks" Mobility  Outcome Measure Help needed turning from your back to your side while in a flat bed without using bedrails?:  None Help needed moving from lying on your back to sitting on the side of a flat bed without using bedrails?: None Help needed moving to and from a bed to a chair (including a wheelchair)?: A Little Help needed standing up from a chair using your arms (e.g., wheelchair or bedside chair)?: A Little Help needed to walk in hospital room?: A Little Help needed climbing 3-5 steps with a railing? : A Little 6 Click Score: 20    End of Session         PT Visit Diagnosis: Unsteadiness on feet (R26.81);Muscle weakness (generalized) (M62.81)    Time: 1517-6160 PT Time Calculation (min) (ACUTE ONLY): 35 min   Charges:   PT Evaluation $PT Eval Low Complexity: 1 Low PT Treatments $Gait Training: 8-22 mins $Therapeutic Exercise: 8-22 mins        Page Lancon PT DPT 10:46 AM,12/26/21  Naureen Benton H Modestine Scherzinger 12/26/2021, 10:39 AM

## 2021-12-26 NOTE — Evaluation (Signed)
Occupational Therapy Evaluation Patient Details Name: Christine Obrien MRN: [id number] DOB: 1941/01/31 Today's Date: 12/26/2021   History of Present Illness Christine Obrien is an 22JHE with a PMH of CAD s/p CABG x2 in 06/2015 (LIMA to LAD, SVG to OM1), HF with recovered EF (70%, previously 35%, G2 DD) s/p CRT-D 04/2019, paroxysmal atrial fibrillation on Eliquis, COPD, hyperlipidemia, type 2 diabetes who presented to [facility] ED 12/23/2021 with generalized weakness and fatigue is in A-fib with RVR. Cardiology is consulted for further assistance.       Interval History:  -feels overall better, can't point out specifics  -remained in sinus / paced rhythm, rate controlled in the 60s.   -no further nausea, SOB, no chest pain or palpitations   Clinical Impression   Pt was seen for OT evaluation this date. Prior to hospital admission, pt was living with her daughter in a 1 story home with 1 STE. She reports being independent with ADL and her daughter provides most of the transportation when needed. Pt presents to acute OT demonstrating impaired ADL performance and functional mobility 2/2 decreased strength, balance, and activity tolerance (See OT problem list). Pt currently requires supervision for standing ADL, ADL transfers with RW, and ADL mobility in the room. SpO2 91-93% on room air, HR in 60's - 70's with exertion. Pt would benefit from skilled OT services while hospitalized to address noted impairments and functional limitations (see below for any additional details) in order to maximize safety and independence while minimizing falls risk and caregiver burden.      Recommendations for follow up therapy are one component of a multi-disciplinary discharge planning process, led by the attending physician.  Recommendations may be updated based on patient status, additional functional criteria and insurance authorization.   Follow Up Recommendations  No OT follow up    Assistance Recommended at Discharge Frequent or  constant Supervision/Assistance  Patient can return home with the following A little help with walking and/or transfers;Assist for transportation;Assistance with cooking/housework;Direct supervision/assist for medications management    Functional Status Assessment  Patient has had a recent decline in their functional status and demonstrates the ability to make significant improvements in function in a reasonable and predictable amount of time.  Equipment Recommendations  None recommended by OT    Recommendations for Other Services       Precautions / Restrictions Precautions Precautions: Fall Restrictions Weight Bearing Restrictions: No      Mobility Bed Mobility               General bed mobility comments: received and left in recliner    Transfers Overall transfer level: Needs assistance Equipment used: Rolling walker (2 wheels) Transfers: Sit to/from Stand Sit to Stand: Supervision                  Balance Overall balance assessment: Needs assistance Sitting-balance support: Feet supported Sitting balance-Leahy Scale: Good     Standing balance support: Bilateral upper extremity supported, Single extremity supported Standing balance-Leahy Scale: Fair                             ADL either performed or assessed with clinical judgement   ADL Overall ADL's : Needs assistance/impaired     Grooming: Standing;Supervision/safety               Lower Body Dressing: Sit to/from stand;Supervision/safety   Toilet Transfer: Supervision/safety;Rolling walker (2 wheels);Regular Toilet  Functional mobility during ADLs: Supervision/safety;Rolling walker (2 wheels)       Vision         Perception     Praxis      Pertinent Vitals/Pain Pain Assessment Pain Assessment: No/denies pain     Hand Dominance Right   Extremity/Trunk Assessment Upper Extremity Assessment Upper Extremity Assessment: Generalized weakness    Lower Extremity Assessment Lower Extremity Assessment: Generalized weakness       Communication Communication Communication: No difficulties   Cognition Arousal/Alertness: Awake/alert Behavior During Therapy: WFL for tasks assessed/performed Overall Cognitive Status: No family/caregiver present to determine baseline cognitive functioning                                 General Comments: demo's questionable STM     General Comments       Exercises     Shoulder Instructions      Home Living Family/patient expects to be discharged to:: Private residence Living Arrangements: Children Available Help at Discharge: Family;Available 24 hours/day Type of Home: House Home Access: Stairs to enter [employer] of Steps: 1 step   Home Layout: One level     Bathroom Shower/Tub: Walk-in shower;Tub/shower unit   Bathroom Toilet: Handicapped height     Home Equipment: Conservation officer, nature (2 wheels);Shower seat;Grab bars - tub/shower;BSC/3in1;Rollator (4 wheels)          Prior Functioning/Environment Prior Level of Function : Independent/Modified Independent             Mobility Comments: reports no recent falls. Pt is Ind with bathing, dressing, ambulation without AD at household and community level. Daughter 24 hours with pt. ADLs Comments: IND with all ADLs, not driving as much        OT Problem List: Decreased activity tolerance;Impaired balance (sitting and/or standing);Decreased strength;Decreased knowledge of use of DME or AE      OT Treatment/Interventions: Self-care/ADL training;Therapeutic exercise;Patient/family education;Balance training;Energy conservation;Therapeutic activities;DME and/or AE instruction    OT Goals(Current goals can be found in the care plan section) Acute Rehab OT Goals Patient Stated Goal: go home OT Goal Formulation: With patient Time For Goal Achievement: 01/09/22 Potential to Achieve Goals: Good ADL  Goals Additional ADL Goal #1: Pt will utilize learned falls prevention strategies with PRN VC during ADL tasks, 5/5 opportunities. Additional ADL Goal #2: Pt verbalize plan to implement at least 2 learned falls prevention strategies.  OT Frequency: Min 2X/week    Co-evaluation              AM-PAC OT "6 Clicks" Daily Activity     Outcome Measure Help from another person eating meals?: None Help from another person taking care of personal grooming?: A Little Help from another person toileting, which includes using toliet, bedpan, or urinal?: A Little Help from another person bathing (including washing, rinsing, drying)?: A Little Help from another person to put on and taking off regular upper body clothing?: None Help from another person to put on and taking off regular lower body clothing?: A Little 6 Click Score: 20   End of Session Equipment Utilized During Treatment: Gait belt;Rolling walker (2 wheels)  Activity Tolerance: Patient tolerated treatment well Patient left: in chair;with call bell/phone within reach;with chair alarm set  OT Visit Diagnosis: Unsteadiness on feet (R26.81);Muscle weakness (generalized) (M62.81)                Time: 1937-9024 OT Time Calculation (min): 9 min  Charges:  OT General Charges $OT Visit: 1 Visit OT Evaluation $OT Eval Low Complexity: 1 Low  Ardeth Perfect., MPH, MS, OTR/L ascom [phone] 12/26/21, 11:22 AM

## 2021-12-26 NOTE — Discharge Summary (Signed)
Physician Discharge Summary  Christine Obrien QJF:[id number] DOB: [birth year] DOA: 12/23/2021  PCP: [name], MD  Admit date: 12/23/2021 Discharge date: 12/26/2021  Admitted From: home  Disposition:  home   Recommendations for Outpatient Follow-up:  Follow up with PCP in 1-2 weeks F/u w/ cardio, Dr. Nehemiah Massed, in 1 week   Home Health: yes Equipment/Devices:  Discharge Condition: stable  CODE STATUS: full Diet recommendation: Heart Healthy / Carb Modified   Brief/Interim Summary: HPI was taken from Dr. Damita Dunnings:  Christine Obrien is a 81 y.o. female with medical history significant for CAD s/p CABG 8937, systolic CHF s/p ICD, with improved EF from 35%, now 75% 12/10/2021, paroxysmal A-fib on Eliquis, LBBB, DM, HLD, carotid artery stenosis s/p right endarterectomy 04/2021, recently hospitalized from 8/22 to 8/25 with respiratory failure secondary to COPD and CHF exacerbations during which time she had multiple rounds of VT/NSVT who now presents to the ED with a several day history of fatigue and generalized malaise with EKG from EMS showing rapid A-fib with rates up to 140.  She denied chest pain but endorses increasing fatigue and dyspnea on exertion.  She denies cough, fever or chills. ED course and data review: Afebrile, heart rate up to 126, BP 127/93, O2 sat 93% on 2 L.  Labs with first troponin 35, BNP 2319, up from 985 a couple weeks prior..  WBC 12,000, creatinine 1.45 up from 0.94 couple weeks prior.  TSH 4.9 with normal free T4, procalcitonin less than 0.10 and COVID-negative EKG, personally viewed and interpreted showing A-fib at 123 with LBBB Chest x-ray shows the following: IMPRESSION: Cardiomegaly with perihilar vascular congestion and mild lower zonal interstitial edema, with small pleural effusions. No acute airspace infiltrate. Similar but interval improved findings on the last study.     CT head and CT abdomen and pelvis were nonacute. Patient was given a diltiazem bolus and  started on a diltiazem infusion given a dose of IV Lasix and hospitalist consulted for admission for rapid A-fib and CHF exacerbation..    As per Dr. Jimmye Norman 9/6-12/26/21: Pt presented in a. Fib w/ RVR and was initially treated w/ diltiazem drip which has since been weaned off. Pt was continued on metoprolol for rate control and eliquis CVA prophylaxis. Of note, pt was also treated w/ IV lasix for acute on chronic CHF exacerbation. Pt's respiratory status and heart rate improved prior to d/c. PT evaluated the pt and recommended home health. Home health was set up by CM prior to d/c.   Discharge Diagnoses:  Principal Problem:   Rapid atrial fibrillation (HCC) Active Problems:   Acute on chronic systolic CHF with improved EF (congestive heart failure) (HCC)   Elevated troponin   AKI (acute kidney injury) ([place])   Coronary artery disease involving native heart   Essential hypertension   Hypothyroidism   Presence of stent in coronary artery   S/P CABG x 2   Type 2 diabetes mellitus without complications ([place])   ICD (implantable cardioverter-defibrillator) in place  PAF: w/ RVR. S/p diltiazem drip. Continue on metoprolol, eliquis. Continue on tele. Cardio following and recs apprec    Acute on chronic systolic CHF: with improved EF. S/p AICD.  Last echo from 12/10/2021 with EF 70 to 75% and G2 DD.  EF in 2020 was 30%. Continue on lasix. Continue on metoprolol and restart losartan at d/c   AKI: Cr is labile. Avoid nephrotoxic meds    DM2: well controlled, HbA1c 6.1. Continue on SSI w/ accuchecks  Hypokalemia: WNL today    Hypothyroidism: continue on levothryoxine    HTN: continue on metoprolol. Holding losartan but can restart at d/c    Hx of CAD: s/p CABG in 2019. W/ elevated troponins, likely secondary to demand ischemia. Continue on aspirin, metoprolol. Holding losartan but can restart at d/c    Generalized weakness: PT recs HH  Discharge Instructions  Discharge Instructions      Amb referral to [facility]   Complete by: As directed    Diet - low sodium heart healthy   Complete by: As directed    Diet Carb Modified   Complete by: As directed    Discharge instructions   Complete by: As directed    F/u w/ PCP in 1-2 weeks. F/u w/ cardio, Dr. Nehemiah Massed, in 1-2 weeks   Increase activity slowly   Complete by: As directed       Allergies as of 12/26/2021       Reactions   Sulfur Dioxide Other (See Comments)   Other reaction(s): abdominal pain   Ace Inhibitors    Other reaction(s): Cough   Atorvastatin    Other reaction(s): Muscle Pain   Statins    Other reaction(s): Muscle Pain Muscle pain   Sulfa Antibiotics    Stomach pains        Medication List     STOP taking these medications    azithromycin 250 MG tablet Commonly known as: ZITHROMAX       TAKE these medications    albuterol 108 (90 Base) MCG/ACT inhaler Commonly known as: VENTOLIN HFA SMARTSIG:2 Inhalation Via Inhaler Every 4 Hours PRN   apixaban 5 MG Tabs tablet Commonly known as: ELIQUIS Take 5 mg by mouth 2 (two) times daily.   aspirin EC 81 MG tablet Take 1 tablet (81 mg total) by mouth daily. Swallow whole.   buPROPion ER 100 MG 12 hr tablet Commonly known as: WELLBUTRIN SR Take 100 mg by mouth 2 (two) times daily.   empagliflozin 10 MG Tabs tablet Commonly known as: JARDIANCE Take 10 mg by mouth daily.   ezetimibe 10 MG tablet Commonly known as: ZETIA Take 10 mg by mouth daily.   ferrous sulfate 325 (65 FE) MG tablet Take 325 mg by mouth daily with breakfast.   fluticasone 50 MCG/ACT nasal spray Commonly known as: FLONASE Place 2 sprays into the nose daily.   fluticasone-salmeterol 250-50 MCG/ACT Aepb Commonly known as: ADVAIR Inhale 1 puff into the lungs in the morning and at bedtime.   furosemide 20 MG tablet Commonly known as: LASIX Take 1 tablet (20 mg total) by mouth daily.   gabapentin 100 MG capsule Commonly known as: NEURONTIN Take 100 mg by  mouth 3 (three) times daily as needed.   glipiZIDE 5 MG tablet Commonly known as: GLUCOTROL Take 5 mg by mouth 2 (two) times daily before a meal.   icosapent Ethyl 1 g capsule Commonly known as: VASCEPA Take 1 g by mouth 2 (two) times daily with a meal.   ipratropium-albuterol 0.5-2.5 (3) MG/3ML Soln Commonly known as: DUONEB Take 3 mLs by nebulization 2 (two) times daily.   levothyroxine 137 MCG tablet Commonly known as: SYNTHROID Take 1 tablet (137 mcg total) by mouth daily.   loratadine 10 MG tablet Commonly known as: CLARITIN Take 10 mg by mouth daily.   losartan 25 MG tablet Commonly known as: COZAAR Take 25 mg by mouth daily.   metFORMIN 500 MG 24 hr tablet Commonly known as: GLUCOPHAGE-XR Take  500 mg by mouth 2 (two) times daily.   metoprolol tartrate 25 MG tablet Commonly known as: LOPRESSOR Take 50 mg by mouth 2 (two) times daily.   multivitamin capsule Take 1 capsule by mouth daily.   omega-3 acid ethyl esters 1 g capsule Commonly known as: LOVAZA Take 1 g by mouth 2 (two) times daily.   omeprazole 20 MG capsule Commonly known as: PRILOSEC Take 20 mg by mouth 2 (two) times daily before a meal.   ondansetron 4 MG disintegrating tablet Commonly known as: ZOFRAN-ODT Take 4 mg by mouth 3 (three) times daily as needed for nausea.   TYLENOL PO Take 500 mg by mouth.   venlafaxine 75 MG tablet Commonly known as: EFFEXOR Take 75 mg by mouth 2 (two) times daily with a meal.        Follow-up Information     Corey Skains, MD. Go in 1 week(s).   Specialty: Cardiology Contact information: [facility] [place] [address] [phone]                Allergies  Allergen Reactions   Sulfur Dioxide Other (See Comments)    Other reaction(s): abdominal pain   Ace Inhibitors     Other reaction(s): Cough   Atorvastatin     Other reaction(s): Muscle Pain   Statins     Other reaction(s): Muscle  Pain Muscle pain   Sulfa Antibiotics     Stomach pains    Consultations: cardio   Procedures/Studies: CT HEAD WO CONTRAST (5MM)  Result Date: 12/23/2021 CLINICAL DATA:  Sudden severe headache. General malaise for several days. EKG with atrial fibrillation with RVR. EXAM: CT HEAD WITHOUT CONTRAST TECHNIQUE: Contiguous axial images were obtained from the base of the skull through the vertex without intravenous contrast. RADIATION DOSE REDUCTION: This exam was performed according to the departmental dose-optimization program which includes automated exposure control, adjustment of the mA and/or kV according to patient size and/or use of iterative reconstruction technique. 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 No new asymmetry is seen concerning for an acute infarct, hemorrhage or mass. There is no midline shift. Basal cisterns are clear. Vascular: There are patchy calcifications in the carotid siphons but no hyperdense central vessels. Skull: Negative for fractures or focal lesions. Sinuses/Orbits: No acute findings. No mastoid effusions. Old lens extractions. Other: None. IMPRESSION: No acute intracranial CT findings or interval changes. Chronic change. Electronically Signed   By: Telford Nab M.D.   On: 12/23/2021 21:37   CT ABDOMEN PELVIS W CONTRAST  Result Date: 12/23/2021 CLINICAL DATA:  Abdominal pain and fatigue.  Generalized malaise. EXAM: CT ABDOMEN AND PELVIS WITH CONTRAST TECHNIQUE: Multidetector CT imaging of the abdomen and pelvis was performed using the standard protocol following bolus administration of intravenous contrast. RADIATION DOSE REDUCTION: This exam was performed according to the departmental dose-optimization program which includes automated exposure control, adjustment of the mA and/or  kV according to patient size and/or use of iterative reconstruction technique. CONTRAST:  5m OMNIPAQUE IOHEXOL 300 MG/ML  SOLN COMPARISON:  CT abdomen pelvis dated 06/12/2012. FINDINGS: Lower chest: Partially visualized small bilateral pleural effusions with partial compressive atelectasis of the lower lobes. Pneumonia is not excluded. Cardiac pacemaker wires noted. No intra-abdominal free air.  Trace free fluid within the pelvis. Hepatobiliary: Fatty liver. No biliary dilatation. Cholecystectomy. No retained  calcified stone noted in the central CBD. Pancreas: Unremarkable. No pancreatic ductal dilatation or surrounding inflammatory changes. Spleen: Normal in size without focal abnormality. Adrenals/Urinary Tract: Bilateral adrenal thickening/hyperplasia or adenoma similar to prior CT. There is no hydronephrosis on either side. The visualized ureters and urinary bladder appear unremarkable. Stomach/Bowel: There is moderate stool throughout the colon. There is no bowel obstruction or active inflammation. The appendix is not visualized with certainty. No inflammatory changes identified in the right lower quadrant. Vascular/Lymphatic: Advanced aortoiliac atherosclerotic disease. Bilateral common iliac artery stent. The stents remain patent. There is a 3 cm infrarenal abdominal aortic aneurysm. The IVC is unremarkable. No portal venous gas. There is no adenopathy. Reproductive: Hysterectomy.  No adnexal masses. Other: None Musculoskeletal: No acute or significant osseous findings. IMPRESSION: 1. No acute intra-abdominal or pelvic pathology. 2. Moderate colonic stool burden. No bowel obstruction. 3. Partially visualized small bilateral pleural effusions with partial compressive atelectasis of the lower lobes. Pneumonia is not excluded. 4. Aortic Atherosclerosis (ICD10-I70.0). Electronically Signed   By: Anner Crete M.D.   On: 12/23/2021 21:34   DG Chest Portable 1 View  Result Date: 12/23/2021 CLINICAL DATA:   Shortness of breath, fatigue and malaise. EXAM: PORTABLE CHEST 1 VIEW COMPARISON:  Portable chest 12/12/21 FINDINGS: Left chest pacemaker/defibrillator and wiring are unchanged in appearance as well as CABG changes. The heart has mildly enlarged compared to the recent study. There is aortic atherosclerosis with stable mediastinum. There is mild perihilar vascular congestion, mild interstitial edema in the lower lung fields with small pleural effusions. The interstitial edema has improved in that it has regressed from the upper lung fields since prior study. No focal pneumonia is seen. In all other respects no further changes. Osteopenia and thoracic spondylosis. IMPRESSION: Cardiomegaly with perihilar vascular congestion and mild lower zonal interstitial edema, with small pleural effusions. No acute airspace infiltrate. Similar but interval improved findings on the last study. Electronically Signed   By: Telford Nab M.D.   On: 12/23/2021 20:20   DG Chest Port 1 View  Result Date: 12/12/2021 CLINICAL DATA:  Dyspnea EXAM: PORTABLE CHEST 1 VIEW COMPARISON:  12/10/2021 FINDINGS: The lungs are hyperinflated in keeping with changes of underlying COPD, stable since prior examination. Superimposed peripheral bibasilar interstitial opacities are present most in keeping with mild interstitial pulmonary edema. This appears stable since prior examination. No pneumothorax or pleural effusion. Coronary artery bypass grafting has been performed. Cardiac size is within normal limits. Left subclavian pacemaker defibrillator is unchanged. No acute bone abnormality. IMPRESSION: 1. Stable mild interstitial pulmonary edema. 2. COPD. Electronically Signed   By: Fidela Salisbury M.D.   On: 12/12/2021 02:23   ECHOCARDIOGRAM COMPLETE  Result Date: 12/11/2021    ECHOCARDIOGRAM REPORT   Patient Name:   PHIL CORTI Date of Exam: 12/10/2021 Medical Rec #:  [id number]   Height: Accession #:    [id number]  Weight: Date of Birth:  Sep 01, 1940    BSA: Patient Age:    35 years    BP:           83/61 mmHg Patient Gender: F           HR:           79 bpm. Exam Location:  [facility] Procedure: 2D Echo, Cardiac Doppler and Color Doppler Indications:     Acute respiratory distress R06.03  History:         Patient has no prior history of Echocardiogram examinations.  CHF, CAD and Previous Myocardial Infarction, Prior CABG,                  Pacemaker and Endarterectomy, COPD, Arrythmias:LBBB; Risk                  Factors:Current Smoker, Diabetes, Hypertension and                  Dyslipidemia.  Sonographer:     Rosalia Hammers Referring Phys:  [place] Diagnosing Phys: Yolonda Kida MD  Sonographer Comments: Image quality was good. IMPRESSIONS  1. Left ventricular ejection fraction, by estimation, is 70 to 75%. The left ventricle has hyperdynamic function. The left ventricle has no regional wall motion abnormalities. There is mild left ventricular hypertrophy. Left ventricular diastolic parameters are consistent with Grade II diastolic dysfunction (pseudonormalization).  2. Right ventricular systolic function is normal. The right ventricular size is normal.  3. The mitral valve is normal in structure. Trivial mitral valve regurgitation.  4. The aortic valve is normal in structure. Aortic valve regurgitation is trivial. FINDINGS  Left Ventricle: Left ventricular ejection fraction, by estimation, is 70 to 75%. The left ventricle has hyperdynamic function. The left ventricle has no regional wall motion abnormalities. The left ventricular internal cavity size was normal in size. There is mild left ventricular hypertrophy. Left ventricular diastolic parameters are consistent with Grade II diastolic dysfunction (pseudonormalization). Right Ventricle: The right ventricular size is normal. No increase in right ventricular wall thickness. Right ventricular systolic function is normal. Left Atrium: Left atrial size was normal in size. Right Atrium: Right  atrial size was normal in size. Pericardium: There is no evidence of pericardial effusion. Mitral Valve: The mitral valve is normal in structure. Trivial mitral valve regurgitation. Tricuspid Valve: The tricuspid valve is normal in structure. Tricuspid valve regurgitation is mild. Aortic Valve: The aortic valve is normal in structure. Aortic valve regurgitation is trivial. Aortic valve mean gradient measures 7.0 mmHg. Aortic valve peak gradient measures 12.8 mmHg. Aortic valve area, by VTI measures 1.29 cm. Pulmonic Valve: The pulmonic valve was normal in structure. Pulmonic valve regurgitation is not visualized. Aorta: The ascending aorta was not well visualized. IAS/Shunts: No atrial level shunt detected by color flow Doppler.  LEFT VENTRICLE PLAX 2D LVIDd:         4.35 cm   Diastology LVIDs:         2.62 cm   LV e' medial:    4.35 cm/s LV PW:         1.40 cm   LV E/e' medial:  37.7 LV IVS:        1.28 cm   LV e' lateral:   4.90 cm/s LVOT diam:     1.60 cm   LV E/e' lateral: 33.5 LV SV:         49 LVOT Area:     2.01 cm  RIGHT VENTRICLE RV Basal diam:  2.82 cm RV Mid diam:    3.12 cm RV S prime:     13.20 cm/s TAPSE (M-mode): 2.3 cm LEFT ATRIUM             RIGHT ATRIUM LA diam:        3.50 cm RA Area:     16.00 cm LA Vol (A2C):   43.5 ml RA Volume:   41.50 ml LA Vol (A4C):   38.7 ml LA Biplane Vol: 41.8 ml  AORTIC VALVE AV Area (Vmax):    1.21 cm AV  Area (Vmean):   1.22 cm AV Area (VTI):     1.29 cm AV Vmax:           179.00 cm/s AV Vmean:          129.000 cm/s AV VTI:            0.381 m AV Peak Grad:      12.8 mmHg AV Mean Grad:      7.0 mmHg LVOT Vmax:         108.00 cm/s LVOT Vmean:        78.400 cm/s LVOT VTI:          0.244 m LVOT/AV VTI ratio: 0.64  AORTA Ao Root diam: 2.70 cm MITRAL VALVE                TRICUSPID VALVE MV Area (PHT): 3.42 cm     TR Peak grad:   49.0 mmHg MV Decel Time: 222 msec     TR Vmax:        350.00 cm/s MV E velocity: 164.00 cm/s MV A velocity: 160.00 cm/s  SHUNTS MV E/A ratio:   1.02         Systemic VTI:  0.24 m                             Systemic Diam: 1.60 cm Yolonda Kida MD Electronically signed by Yolonda Kida MD Signature Date/Time: 12/11/2021/5:30:23 PM    Final    DG Chest 2 View  Result Date: 12/10/2021 CLINICAL DATA:  Shortness of breath for 1 day. EXAM: CHEST - 2 VIEW COMPARISON:  07/21/2020 FINDINGS: UPPER limits normal heart size, CABG changes and LEFT-sided pacemaker/AICD again noted. Mild interstitial opacities and trace bilateral pleural effusions are noted. There is no evidence of pneumothorax or acute bony abnormality. IMPRESSION: Mild interstitial opacities and trace bilateral pleural, favor mild pulmonary edema. Electronically Signed   By: Margarette Canada M.D.   On: 12/10/2021 09:29   (Echo, Carotid, EGD, Colonoscopy, ERCP)    Subjective: Pt denies any complaints    Discharge Exam: Vitals:   12/26/21 0819 12/26/21 1209  BP: (!) 157/63 126/78  Pulse: 68 77  Resp: 17 18  Temp: 97.9 F (36.6 C) 97.7 F (36.5 C)  SpO2: 92% 97%   Vitals:   12/26/21 0425 12/26/21 0800 12/26/21 0819 12/26/21 1209  BP:   (!) 157/63 126/78  Pulse:   68 77  Resp:   17 18  Temp:   97.9 F (36.6 C) 97.7 F (36.5 C)  TempSrc:      SpO2:  94% 92% 97%  Weight: 57.4 kg       General: Pt is alert, awake, not in acute distress Cardiovascular: irregularly irregular, no rubs, no gallops Respiratory: decreased breath sounds b/l  Abdominal: Soft, NT, ND, bowel sounds + Extremities: no edema, no cyanosis    The results of significant diagnostics from this hospitalization (including imaging, microbiology, ancillary and laboratory) are listed below for reference.     Microbiology: Recent Results (from the past 240 hour(s))  SARS Coronavirus 2 by RT PCR (hospital order, performed in [facility] hospital lab) *cepheid single result test* Anterior Nasal Swab     Status: None   Collection Time: 12/23/21  8:05 PM   Specimen: Anterior Nasal Swab  Result Value  Ref Range Status   SARS Coronavirus 2 by RT PCR NEGATIVE NEGATIVE Final    Comment: (  NOTE) SARS-CoV-2 target nucleic acids are NOT DETECTED.  The SARS-CoV-2 RNA is generally detectable in upper and lower respiratory specimens during the acute phase of infection. The lowest concentration of SARS-CoV-2 viral copies this assay can detect is 250 copies / mL. A negative result does not preclude SARS-CoV-2 infection and should not be used as the sole basis for treatment or other patient management decisions.  A negative result may occur with improper specimen collection / handling, submission of specimen other than nasopharyngeal swab, presence of viral mutation(s) within the areas targeted by this assay, and inadequate number of viral copies (<250 copies / mL). A negative result must be combined with clinical observations, patient history, and epidemiological information.  Fact Sheet for Patients:   https://www.patel.info/  Fact Sheet for Healthcare Providers: https://hall.com/  This test is not yet approved or  cleared by the [place] FDA and has been authorized for detection and/or diagnosis of SARS-CoV-2 by FDA under an Emergency Use Authorization (EUA).  This EUA will remain in effect (meaning this test can be used) for the duration of the COVID-19 declaration under Section 564(b)(1) of the Act, 21 U.S.C. section 360bbb-3(b)(1), unless the authorization is terminated or revoked sooner.  Performed at [facility], [address] [place], [place] [address]      Labs: BNP (last 3 results) Recent Labs    12/10/21 0917 12/23/21 2004  BNP 985.6* 2,979.8*   Basic Metabolic Panel: Recent Labs  Lab 12/23/21 2004 12/23/21 2206 12/24/21 0456 12/25/21 0410 12/26/21 0514  NA 139  --  141 137 143  K 3.6  --  2.9* 3.4* 4.0  CL 101  --  99 102 106  CO2 27  --  '29 26 29  '$ GLUCOSE 152*  --  118* 87 125*  BUN 27*  --  25*  23 20  CREATININE 1.45*  --  1.34* 1.08* 1.11*  CALCIUM 9.5  --  9.3 9.3 9.7  MG  --  2.2 2.0  --  2.2   Liver Function Tests: Recent Labs  Lab 12/23/21 2004  AST 26  ALT 41  ALKPHOS 53  BILITOT 1.0  PROT 6.7  ALBUMIN 3.7   Recent Labs  Lab 12/23/21 2004  LIPASE 24   No results for input(s): "AMMONIA" in the last 168 hours. CBC: Recent Labs  Lab 12/23/21 2004 12/24/21 0456 12/25/21 0410 12/26/21 0514  WBC 12.2* 12.7* 8.3 9.0  NEUTROABS 10.1*  --   --   --   HGB 12.1 11.3* 11.3* 11.7*  HCT 38.9 36.0 35.5* 37.0  MCV 98.2 96.8 97.5 100.0  PLT 238 199 166 165   Cardiac Enzymes: No results for input(s): "CKTOTAL", "CKMB", "CKMBINDEX", "TROPONINI" in the last 168 hours. BNP: Invalid input(s): "POCBNP" CBG: Recent Labs  Lab 12/25/21 1643 12/25/21 2033 12/25/21 2122 12/26/21 0821 12/26/21 1209  GLUCAP 164* 181* 170* 147* 162*   D-Dimer No results for input(s): "DDIMER" in the last 72 hours. Hgb A1c Recent Labs    12/24/21 0456  HGBA1C 6.1*   Lipid Profile No results for input(s): "CHOL", "HDL", "LDLCALC", "TRIG", "CHOLHDL", "LDLDIRECT" in the last 72 hours. Thyroid function studies Recent Labs    12/23/21 2004  TSH 4.923*   Anemia work up No results for input(s): "VITAMINB12", "FOLATE", "FERRITIN", "TIBC", "IRON", "RETICCTPCT" in the last 72 hours. Urinalysis    Component Value Date/Time   COLORURINE YELLOW (A) 12/24/2021 0127   APPEARANCEUR CLEAR (A) 12/24/2021 0127   APPEARANCEUR Clear 03/31/2013 0011  LABSPEC 1.015 12/24/2021 0127   LABSPEC 1.005 03/31/2013 0011   PHURINE 5.0 12/24/2021 0127   GLUCOSEU >=500 (A) 12/24/2021 0127   GLUCOSEU Negative 03/31/2013 0011   HGBUR NEGATIVE 12/24/2021 0127   BILIRUBINUR NEGATIVE 12/24/2021 0127   BILIRUBINUR Negative 03/31/2013 0011   KETONESUR NEGATIVE 12/24/2021 0127   PROTEINUR NEGATIVE 12/24/2021 0127   NITRITE NEGATIVE 12/24/2021 0127   LEUKOCYTESUR NEGATIVE 12/24/2021 0127   LEUKOCYTESUR  Negative 03/31/2013 0011   Sepsis Labs Recent Labs  Lab 12/23/21 2004 12/24/21 0456 12/25/21 0410 12/26/21 0514  WBC 12.2* 12.7* 8.3 9.0   Microbiology Recent Results (from the past 240 hour(s))  SARS Coronavirus 2 by RT PCR (hospital order, performed in [facility] hospital lab) *cepheid single result test* Anterior Nasal Swab     Status: None   Collection Time: 12/23/21  8:05 PM   Specimen: Anterior Nasal Swab  Result Value Ref Range Status   SARS Coronavirus 2 by RT PCR NEGATIVE NEGATIVE Final    Comment: (NOTE) SARS-CoV-2 target nucleic acids are NOT DETECTED.  The SARS-CoV-2 RNA is generally detectable in upper and lower respiratory specimens during the acute phase of infection. The lowest concentration of SARS-CoV-2 viral copies this assay can detect is 250 copies / mL. A negative result does not preclude SARS-CoV-2 infection and should not be used as the sole basis for treatment or other patient management decisions.  A negative result may occur with improper specimen collection / handling, submission of specimen other than nasopharyngeal swab, presence of viral mutation(s) within the areas targeted by this assay, and inadequate number of viral copies (<250 copies / mL). A negative result must be combined with clinical observations, patient history, and epidemiological information.  Fact Sheet for Patients:   https://www.patel.info/  Fact Sheet for Healthcare Providers: https://hall.com/  This test is not yet approved or  cleared by the [place] FDA and has been authorized for detection and/or diagnosis of SARS-CoV-2 by FDA under an Emergency Use Authorization (EUA).  This EUA will remain in effect (meaning this test can be used) for the duration of the COVID-19 declaration under Section 564(b)(1) of the Act, 21 U.S.C. section 360bbb-3(b)(1), unless the authorization is terminated or revoked sooner.  Performed at  [facility], [address] [place], [place] [address]      Time coordinating discharge: Over 30 minutes  SIGNED:   Wyvonnia Dusky, MD  [facility] 12/26/2021, 2:03 PM Pager   If 7PM-7AM, please contact night-coverage [email or web address]

## 2022-01-02 ENCOUNTER — Ambulatory Visit: Payer: Medicare Other | Admitting: Family

## 2022-01-09 ENCOUNTER — Ambulatory Visit: Payer: Medicare Other | Admitting: Family

## 2022-01-14 ENCOUNTER — Inpatient Hospital Stay
Admission: EM | Admit: 2022-01-14 | Discharge: 2022-01-15 | DRG: 291 | Disposition: A | Discharge status: Home / Self Care | Payer: Medicare Other | Attending: Hospitalist | Admitting: Hospitalist

## 2022-01-14 ENCOUNTER — Emergency Department: Payer: Medicare Other

## 2022-01-14 ENCOUNTER — Inpatient Hospital Stay: Payer: Medicare Other

## 2022-01-14 DIAGNOSIS — Z8249 Family history of ischemic heart disease and other diseases of the circulatory system: Secondary | ICD-10-CM

## 2022-01-14 DIAGNOSIS — I5021 Acute systolic (congestive) heart failure: Secondary | ICD-10-CM | POA: Diagnosis present

## 2022-01-14 DIAGNOSIS — Z8541 Personal history of malignant neoplasm of cervix uteri: Secondary | ICD-10-CM

## 2022-01-14 DIAGNOSIS — Z515 Encounter for palliative care: Secondary | ICD-10-CM | POA: Diagnosis not present

## 2022-01-14 DIAGNOSIS — Z95810 Presence of automatic (implantable) cardiac defibrillator: Secondary | ICD-10-CM | POA: Diagnosis not present

## 2022-01-14 DIAGNOSIS — I513 Intracardiac thrombosis, not elsewhere classified: Secondary | ICD-10-CM | POA: Diagnosis present

## 2022-01-14 DIAGNOSIS — I110 Hypertensive heart disease with heart failure: Principal | ICD-10-CM | POA: Diagnosis present

## 2022-01-14 DIAGNOSIS — Z79890 Hormone replacement therapy (postmenopausal): Secondary | ICD-10-CM | POA: Diagnosis not present

## 2022-01-14 DIAGNOSIS — Z7901 Long term (current) use of anticoagulants: Secondary | ICD-10-CM

## 2022-01-14 DIAGNOSIS — F411 Generalized anxiety disorder: Secondary | ICD-10-CM | POA: Diagnosis present

## 2022-01-14 DIAGNOSIS — K219 Gastro-esophageal reflux disease without esophagitis: Secondary | ICD-10-CM | POA: Diagnosis present

## 2022-01-14 DIAGNOSIS — J441 Chronic obstructive pulmonary disease with (acute) exacerbation: Secondary | ICD-10-CM | POA: Diagnosis present

## 2022-01-14 DIAGNOSIS — Z79899 Other long term (current) drug therapy: Secondary | ICD-10-CM | POA: Diagnosis not present

## 2022-01-14 DIAGNOSIS — R7989 Other specified abnormal findings of blood chemistry: Secondary | ICD-10-CM | POA: Diagnosis present

## 2022-01-14 DIAGNOSIS — E785 Hyperlipidemia, unspecified: Secondary | ICD-10-CM | POA: Diagnosis present

## 2022-01-14 DIAGNOSIS — E1122 Type 2 diabetes mellitus with diabetic chronic kidney disease: Secondary | ICD-10-CM | POA: Diagnosis present

## 2022-01-14 DIAGNOSIS — I5023 Acute on chronic systolic (congestive) heart failure: Secondary | ICD-10-CM | POA: Diagnosis present

## 2022-01-14 DIAGNOSIS — F17210 Nicotine dependence, cigarettes, uncomplicated: Secondary | ICD-10-CM | POA: Diagnosis present

## 2022-01-14 DIAGNOSIS — Z833 Family history of diabetes mellitus: Secondary | ICD-10-CM

## 2022-01-14 DIAGNOSIS — Z20822 Contact with and (suspected) exposure to covid-19: Secondary | ICD-10-CM | POA: Diagnosis present

## 2022-01-14 DIAGNOSIS — I248 Other forms of acute ischemic heart disease: Secondary | ICD-10-CM | POA: Diagnosis present

## 2022-01-14 DIAGNOSIS — F331 Major depressive disorder, recurrent, moderate: Secondary | ICD-10-CM | POA: Diagnosis present

## 2022-01-14 DIAGNOSIS — Z951 Presence of aortocoronary bypass graft: Secondary | ICD-10-CM | POA: Diagnosis not present

## 2022-01-14 DIAGNOSIS — I252 Old myocardial infarction: Secondary | ICD-10-CM

## 2022-01-14 DIAGNOSIS — J9602 Acute respiratory failure with hypercapnia: Secondary | ICD-10-CM | POA: Diagnosis present

## 2022-01-14 DIAGNOSIS — E119 Type 2 diabetes mellitus without complications: Secondary | ICD-10-CM

## 2022-01-14 DIAGNOSIS — I480 Paroxysmal atrial fibrillation: Secondary | ICD-10-CM | POA: Diagnosis present

## 2022-01-14 DIAGNOSIS — Z8679 Personal history of other diseases of the circulatory system: Secondary | ICD-10-CM

## 2022-01-14 DIAGNOSIS — I4891 Unspecified atrial fibrillation: Principal | ICD-10-CM

## 2022-01-14 DIAGNOSIS — Z7984 Long term (current) use of oral hypoglycemic drugs: Secondary | ICD-10-CM

## 2022-01-14 DIAGNOSIS — I2510 Atherosclerotic heart disease of native coronary artery without angina pectoris: Secondary | ICD-10-CM | POA: Diagnosis present

## 2022-01-14 DIAGNOSIS — E039 Hypothyroidism, unspecified: Secondary | ICD-10-CM | POA: Diagnosis present

## 2022-01-14 DIAGNOSIS — R778 Other specified abnormalities of plasma proteins: Secondary | ICD-10-CM | POA: Diagnosis present

## 2022-01-14 DIAGNOSIS — Z882 Allergy status to sulfonamides status: Secondary | ICD-10-CM

## 2022-01-14 DIAGNOSIS — Z7189 Other specified counseling: Secondary | ICD-10-CM | POA: Diagnosis not present

## 2022-01-14 DIAGNOSIS — J9621 Acute and chronic respiratory failure with hypoxia: Secondary | ICD-10-CM | POA: Diagnosis present

## 2022-01-14 DIAGNOSIS — Z888 Allergy status to other drugs, medicaments and biological substances status: Secondary | ICD-10-CM

## 2022-01-14 DIAGNOSIS — I10 Essential (primary) hypertension: Secondary | ICD-10-CM | POA: Diagnosis present

## 2022-01-14 LAB — CBC WITH DIFFERENTIAL/PLATELET
Abs Immature Granulocytes: 0.02 10*3/uL (ref 0.00–0.07)
Basophils Absolute: 0 10*3/uL (ref 0.0–0.1)
Basophils Relative: 0 %
Eosinophils Absolute: 0.1 10*3/uL (ref 0.0–0.5)
Eosinophils Relative: 1 %
HCT: 37.4 % (ref 36.0–46.0)
Hemoglobin: 11.2 g/dL — ABNORMAL LOW (ref 12.0–15.0)
Immature Granulocytes: 0 %
Lymphocytes Relative: 16 %
Lymphs Abs: 1.3 10*3/uL (ref 0.7–4.0)
MCH: 30.4 pg (ref 26.0–34.0)
MCHC: 29.9 g/dL — ABNORMAL LOW (ref 30.0–36.0)
MCV: 101.4 fL — ABNORMAL HIGH (ref 80.0–100.0)
Monocytes Absolute: 0.4 10*3/uL (ref 0.1–1.0)
Monocytes Relative: 5 %
Neutro Abs: 5.9 10*3/uL (ref 1.7–7.7)
Neutrophils Relative %: 78 %
Platelets: 234 10*3/uL (ref 150–400)
RBC: 3.69 MIL/uL — ABNORMAL LOW (ref 3.87–5.11)
RDW: 15.1 % (ref 11.5–15.5)
WBC: 7.7 10*3/uL (ref 4.0–10.5)
nRBC: 0 % (ref 0.0–0.2)

## 2022-01-14 LAB — COMPREHENSIVE METABOLIC PANEL
ALT: 28 U/L (ref 0–44)
AST: 27 U/L (ref 15–41)
Albumin: 3.2 g/dL — ABNORMAL LOW (ref 3.5–5.0)
Alkaline Phosphatase: 72 U/L (ref 38–126)
Anion gap: 7 (ref 5–15)
BUN: 22 mg/dL (ref 8–23)
CO2: 27 mmol/L (ref 22–32)
Calcium: 9.7 mg/dL (ref 8.9–10.3)
Chloride: 103 mmol/L (ref 98–111)
Creatinine, Ser: 1.05 mg/dL — ABNORMAL HIGH (ref 0.44–1.00)
GFR, Estimated: 54 mL/min — ABNORMAL LOW (ref >60)
Glucose, Bld: 321 mg/dL — ABNORMAL HIGH (ref 70–99)
Potassium: 4.8 mmol/L (ref 3.5–5.1)
Sodium: 137 mmol/L (ref 135–145)
Total Bilirubin: 0.8 mg/dL (ref 0.3–1.2)
Total Protein: 6.8 g/dL (ref 6.5–8.1)

## 2022-01-14 LAB — BLOOD GAS, VENOUS
Acid-Base Excess: 3.6 mmol/L — ABNORMAL HIGH (ref 0.0–2.0)
Bicarbonate: 31.5 mmol/L — ABNORMAL HIGH (ref 20.0–28.0)
Delivery systems: POSITIVE
FIO2: 50 %
Mechanical Rate: 12
Mode: POSITIVE
O2 Saturation: 29 %
Patient temperature: 37
RATE: 12 resp/min
pCO2, Ven: 64 mmHg — ABNORMAL HIGH (ref 44–60)
pH, Ven: 7.3 (ref 7.25–7.43)
pO2, Ven: 28 mmHg — CL (ref 32–45)

## 2022-01-14 LAB — CBG MONITORING, ED
Glucose-Capillary: 193 mg/dL — ABNORMAL HIGH (ref 70–99)
Glucose-Capillary: 229 mg/dL — ABNORMAL HIGH (ref 70–99)

## 2022-01-14 LAB — BRAIN NATRIURETIC PEPTIDE: B Natriuretic Peptide: 3313.3 pg/mL — ABNORMAL HIGH (ref 0.0–100.0)

## 2022-01-14 LAB — PROCALCITONIN: Procalcitonin: 0.12 ng/mL

## 2022-01-14 LAB — TROPONIN I (HIGH SENSITIVITY)
Troponin I (High Sensitivity): 19 ng/L — ABNORMAL HIGH (ref <18)
Troponin I (High Sensitivity): 21 ng/L — ABNORMAL HIGH (ref <18)

## 2022-01-14 LAB — SARS CORONAVIRUS 2 BY RT PCR: SARS Coronavirus 2 by RT PCR: NEGATIVE

## 2022-01-14 MED ORDER — NICOTINE 21 MG/24HR TD PT24: 21.0000 mg | MEDICATED_PATCH | Freq: Every day | TRANSDERMAL | Status: DC | PRN

## 2022-01-14 MED ORDER — BUPROPION HCL ER (SR) 100 MG PO TB12
100.0000 mg | ORAL_TABLET | Freq: Two times a day (BID) | ORAL | Status: DC
  Administered 2022-01-14 – 2022-01-15 (×2): 100 mg via ORAL
  Filled 2022-01-14 (×4): qty 1

## 2022-01-14 MED ORDER — ONDANSETRON HCL 4 MG PO TABS: 4.0000 mg | ORAL_TABLET | Freq: Four times a day (QID) | ORAL | Status: DC | PRN

## 2022-01-14 MED ORDER — FUROSEMIDE 10 MG/ML IJ SOLN
40.0000 mg | Freq: Once | INTRAMUSCULAR | Status: AC
  Administered 2022-01-14: 40 mg via INTRAVENOUS
  Filled 2022-01-14: qty 4

## 2022-01-14 MED ORDER — APIXABAN 2.5 MG PO TABS
2.5000 mg | ORAL_TABLET | Freq: Once | ORAL | Status: AC
  Administered 2022-01-14: 2.5 mg via ORAL
  Filled 2022-01-14 (×2): qty 1

## 2022-01-14 MED ORDER — LEVOTHYROXINE SODIUM 137 MCG PO TABS
137.0000 ug | ORAL_TABLET | Freq: Every day | ORAL | Status: DC
  Administered 2022-01-15: 137 ug via ORAL
  Filled 2022-01-14: qty 1

## 2022-01-14 MED ORDER — PANTOPRAZOLE SODIUM 40 MG PO TBEC
40.0000 mg | DELAYED_RELEASE_TABLET | Freq: Two times a day (BID) | ORAL | Status: DC
  Administered 2022-01-15: 40 mg via ORAL
  Filled 2022-01-14: qty 1

## 2022-01-14 MED ORDER — IPRATROPIUM-ALBUTEROL 0.5-2.5 (3) MG/3ML IN SOLN
3.0000 mL | Freq: Once | RESPIRATORY_TRACT | Status: AC
  Administered 2022-01-14: 3 mL via RESPIRATORY_TRACT
  Filled 2022-01-14: qty 3

## 2022-01-14 MED ORDER — APIXABAN 2.5 MG PO TABS
2.5000 mg | ORAL_TABLET | Freq: Two times a day (BID) | ORAL | Status: DC
  Administered 2022-01-15: 2.5 mg via ORAL
  Filled 2022-01-14 (×3): qty 1

## 2022-01-14 MED ORDER — GABAPENTIN 100 MG PO CAPS
100.0000 mg | ORAL_CAPSULE | Freq: Every day | ORAL | Status: DC
  Administered 2022-01-14: 100 mg via ORAL
  Filled 2022-01-14: qty 1

## 2022-01-14 MED ORDER — ADULT MULTIVITAMIN W/MINERALS CH
1.0000 | ORAL_TABLET | Freq: Every day | ORAL | Status: DC
  Administered 2022-01-15: 1 via ORAL
  Filled 2022-01-14: qty 1

## 2022-01-14 MED ORDER — FUROSEMIDE 10 MG/ML IJ SOLN
60.0000 mg | Freq: Every day | INTRAMUSCULAR | Status: DC
  Filled 2022-01-14: qty 8

## 2022-01-14 MED ORDER — IPRATROPIUM-ALBUTEROL 0.5-2.5 (3) MG/3ML IN SOLN
3.0000 mL | Freq: Four times a day (QID) | RESPIRATORY_TRACT | Status: AC
  Administered 2022-01-14 – 2022-01-15 (×5): 3 mL via RESPIRATORY_TRACT
  Filled 2022-01-14 (×3): qty 3
  Filled 2022-01-14: qty 6

## 2022-01-14 MED ORDER — FLUTICASONE FUROATE-VILANTEROL 200-25 MCG/ACT IN AEPB: 1.0000 | INHALATION_SPRAY | Freq: Every day | RESPIRATORY_TRACT | Status: DC

## 2022-01-14 MED ORDER — ACETAMINOPHEN 325 MG RE SUPP: 650.0000 mg | Freq: Four times a day (QID) | RECTAL | Status: DC | PRN

## 2022-01-14 MED ORDER — INSULIN ASPART 100 UNIT/ML IJ SOLN
0.0000 [IU] | Freq: Every day | INTRAMUSCULAR | Status: DC
  Administered 2022-01-14: 2 [IU] via SUBCUTANEOUS
  Filled 2022-01-14: qty 1

## 2022-01-14 MED ORDER — METOPROLOL SUCCINATE ER 50 MG PO TB24
75.0000 mg | ORAL_TABLET | Freq: Two times a day (BID) | ORAL | Status: DC
  Administered 2022-01-14 – 2022-01-15 (×2): 75 mg via ORAL
  Filled 2022-01-14 (×2): qty 2

## 2022-01-14 MED ORDER — DILTIAZEM HCL 25 MG/5ML IV SOLN
10.0000 mg | Freq: Once | INTRAVENOUS | Status: AC
  Administered 2022-01-14: 10 mg via INTRAVENOUS
  Filled 2022-01-14: qty 5

## 2022-01-14 MED ORDER — DILTIAZEM HCL-DEXTROSE 125-5 MG/125ML-% IV SOLN (PREMIX)
5.0000 mg/h | INTRAVENOUS | Status: DC
  Administered 2022-01-14: 5 mg/h via INTRAVENOUS
  Administered 2022-01-15: 12.5 mg/h via INTRAVENOUS
  Filled 2022-01-14 (×2): qty 125

## 2022-01-14 MED ORDER — IPRATROPIUM-ALBUTEROL 0.5-2.5 (3) MG/3ML IN SOLN: 3.0000 mL | Freq: Two times a day (BID) | RESPIRATORY_TRACT | Status: DC

## 2022-01-14 MED ORDER — INSULIN ASPART 100 UNIT/ML IJ SOLN
0.0000 [IU] | Freq: Three times a day (TID) | INTRAMUSCULAR | Status: DC
  Administered 2022-01-14 – 2022-01-15 (×2): 4 [IU] via SUBCUTANEOUS
  Filled 2022-01-14 (×2): qty 1

## 2022-01-14 MED ORDER — EZETIMIBE 10 MG PO TABS
10.0000 mg | ORAL_TABLET | Freq: Every day | ORAL | Status: DC
  Administered 2022-01-14 – 2022-01-15 (×2): 10 mg via ORAL
  Filled 2022-01-14 (×2): qty 1

## 2022-01-14 MED ORDER — SENNOSIDES-DOCUSATE SODIUM 8.6-50 MG PO TABS: 1.0000 | ORAL_TABLET | Freq: Every evening | ORAL | Status: DC | PRN

## 2022-01-14 MED ORDER — ICOSAPENT ETHYL 1 G PO CAPS
1.0000 g | ORAL_CAPSULE | Freq: Two times a day (BID) | ORAL | Status: DC
  Filled 2022-01-14 (×2): qty 1

## 2022-01-14 MED ORDER — ACETAMINOPHEN 325 MG PO TABS
650.0000 mg | ORAL_TABLET | Freq: Four times a day (QID) | ORAL | Status: DC | PRN
  Administered 2022-01-14: 650 mg via ORAL
  Filled 2022-01-14: qty 2

## 2022-01-14 MED ORDER — ONDANSETRON HCL 4 MG/2ML IJ SOLN: 4.0000 mg | Freq: Four times a day (QID) | INTRAMUSCULAR | Status: DC | PRN

## 2022-01-14 MED ORDER — VENLAFAXINE HCL ER 75 MG PO CP24
75.0000 mg | ORAL_CAPSULE | Freq: Two times a day (BID) | ORAL | Status: DC
  Administered 2022-01-14 – 2022-01-15 (×2): 75 mg via ORAL
  Filled 2022-01-14 (×4): qty 1

## 2022-01-14 MED ORDER — METOPROLOL SUCCINATE ER 50 MG PO TB24
75.0000 mg | ORAL_TABLET | Freq: Every day | ORAL | Status: DC
  Administered 2022-01-14: 75 mg via ORAL
  Filled 2022-01-14: qty 2

## 2022-01-14 MED ORDER — ASPIRIN 81 MG PO TBEC
81.0000 mg | DELAYED_RELEASE_TABLET | Freq: Every day | ORAL | Status: DC
  Administered 2022-01-14 – 2022-01-15 (×2): 81 mg via ORAL
  Filled 2022-01-14 (×2): qty 1

## 2022-01-14 NOTE — Assessment & Plan Note (Addendum)
-   Currently in RVR, presumptive etiology is secondary to heart failure exacerbation - Patient had a TEE at [facility] on 01/10/2022: Clear left atrial appendage thrombus visualized.  Cardioversion was not proceeded at that time.  Severely reduced left ventricular function with LVEF estimated at 30%.  Moderately reduced right ventricular function similar to prior TEE. - Treat per heart failure exacerbation

## 2022-01-14 NOTE — Assessment & Plan Note (Signed)
-   Levothyroxine 137 mcg daily resumed

## 2022-01-14 NOTE — Assessment & Plan Note (Signed)
-   Resumed home metoprolol succinate 75 mg p.o. twice daily - Currently on diltiazem drip

## 2022-01-14 NOTE — Assessment & Plan Note (Signed)
-   Bupropion 100 mg p.o. twice daily, venlafaxine 75 mg p.o. twice daily resumed

## 2022-01-14 NOTE — Assessment & Plan Note (Signed)
Acute on chronic heart failure with reduced ejection fraction - Strict I's and O's - Additional furosemide 60 mg IV one-time dose ordered for 01/15/2022

## 2022-01-14 NOTE — Assessment & Plan Note (Signed)
-   Patient does not have chest pain at bedside - No ST/ischemic changes on EKG - Presumptive etiology is secondary to acute heart failure exacerbation causing demand ischemia

## 2022-01-14 NOTE — ED Notes (Signed)
Called to [facility] for transfer per MD McHugh/Images Dietrich Pates Christine Obrien facesheet

## 2022-01-14 NOTE — ED Triage Notes (Signed)
Pt arrived via EMS today for SOB. Per EMS they got to pt house pt was in resp distress. EMS applied CPAP on pt as pt was 86% on RA. CPAP improved pt oxygenation to 96%. Pt was d/c from [facility] yesterday for the same thing. Pt is currently in A-fib. Pt a/ox4, NAD, Resp and MD at bedside. Pt placed on Bipap.

## 2022-01-14 NOTE — Assessment & Plan Note (Signed)
-   Ezetimibe 10 mg daily, Vascepa 1 g daily resumed

## 2022-01-14 NOTE — Hospital Course (Signed)
[name] is a 81 year old female with history of atrial fibrillation on anticoagulation, depression, anxiety, hypothyroid, non-insulin-dependent diabetes mellitus, hypertension, GERD, neuropathy, who presents emergency department for chief concerns of shortness of breath.  Initial vitals in the emergency department showed temperature of 96.6 and increased to 98.4, respiration rate of 18, heart rate of 100, blood pressure 119/75, SPO2 100% on BiPAP.  VBG: 7.30/64/28 on bilevel positive airway pressure  Serum sodium is 137, potassium 4.8, chloride 103, bicarb 27, BUN of 22, serum creatinine 1.05, eGFR 54, nonfasting glucose 321, WBC 7.7, hemoglobin 11.2, platelets of 234.  BNP was 3313.3.  High sensitive troponin was 19 and increased to 21 on recheck.  COVID PCR was negative.  ED treatment: Diltiazem 10 mg IV, furosemide 40 mg IV, DuoNebs x2, metoprolol 75 mg p.o. one-time dose, diltiazem gtt. initiated.

## 2022-01-14 NOTE — ED Provider Notes (Signed)
[facility] Provider Note    Event Date/Time   First MD Initiated Contact with Patient 01/14/22 [phone]     (approximate)   History   Shortness of Breath   HPI  Christine Obrien is a 81 y.o. female  with pmh CAD s/p CABG 9604, systolic CHF s/p ICD, with improved EF from 35%, now 75% 12/10/2021, paroxysmal A-fib on Eliquis, LBBB, DM, HLD, carotid artery stenosis s/p right endarterectomy who presents with shortness of breath.  Symptoms started this morning.  Per EMS patient was initially hypertensive to the 160s and satting 86% on room air.  She was placed on BiPAP with improvement in sats and work of breathing.  Found to be in A-fib with RVR.  Patient was just discharged from [place] yesterday.  She was admitted for hematochezia from hemorrhoids versus infectious diarrhea and during hospitalization she was in A-fib with RVR.  She underwent TEE but they were unable to cardiovert her because she had a clot in her left atrium.  She was kept on metoprolol and increased in the dose.  She was kept on apixaban but at a lower dose 2.5 twice daily.  EF during admission was reduced.  Appears that she was discharged on home oxygen.    Past Medical History:  Diagnosis Date   AICD (automatic cardioverter/defibrillator) present    Anxiety    Asthma    Atherosclerotic peripheral vascular disease with intermittent claudication (HCC)    Cancer (HCC)    cervical   Carotid artery occlusion    CHF (congestive heart failure), NYHA class III (HCC)    CKD (chronic kidney disease), stage III (HCC)    COPD (chronic obstructive pulmonary disease) (HCC)    Coronary artery disease    Current use of long term anticoagulation    Depression    Diabetes mellitus without complication (HCC)    GERD (gastroesophageal reflux disease)    Gout    HLD (hyperlipidemia)    Hx of CABG    Hypertension    Hypothyroidism    Kidney stones    LBBB (left bundle branch block)    Myocardial infarction ([place])     Presence of permanent cardiac pacemaker    Subclavian steal syndrome     Patient Active Problem List   Diagnosis Date Noted   Rapid atrial fibrillation ([place]) 12/23/2021   Elevated troponin 12/23/2021   AKI (acute kidney injury) ([place]) 12/23/2021   Acute on chronic respiratory failure with hypoxia (HCC)    COPD exacerbation (HCC)    PAF (paroxysmal atrial fibrillation) (HCC)    Acute hypoxemic respiratory failure ([place]) 12/10/2021   Carotid stenosis, right 05/08/2021   Pain in limb 04/23/2021   Acute CHF (congestive heart failure) ([place]) 54/12/8117   Acute systolic CHF (congestive heart failure) ([place]) 07/21/2020   CKD stage 3 due to type 2 diabetes mellitus ([place]) 07/21/2020   Nicotine dependence 07/21/2020   ICD (implantable cardioverter-defibrillator) in place 11/02/2019   LBBB (left bundle branch block) 12/02/2018   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   Subclavian steal syndrome 01/12/2018   Atherosclerotic peripheral vascular disease with intermittent claudication ([place]) 12/30/2017   Pain in both upper extremities 12/30/2017   Hyperlipidemia 12/30/2017   Coronary artery disease involving native heart 12/30/2017   Postoperative atrial fibrillation ([place]) 07/09/2017   S/P CABG x 2 07/09/2017   Stenosis of left subclavian artery ([place]) 07/07/2017   History of non-ST elevation myocardial infarction (NSTEMI) 07/06/2017  Presence of stent in coronary artery 07/06/2017   Chest pain 07/03/2017   NSTEMI (non-ST elevated myocardial infarction) ([place]) 07/03/2017   Impingement syndrome of right shoulder region 03/06/2017   Diabetes ([place]) 03/25/2016   Carotid stenosis 03/25/2016   Anxiety, generalized 08/24/2015   Gastroesophageal reflux disease without esophagitis 07/13/2015   DDD (degenerative disc disease), cervical 04/06/2015   Cervical radiculitis 04/06/2015   Type 2 diabetes mellitus without complications ([place]) 78/24/2353   Carotid bruit 09/15/2011    Symptoms involving cardiovascular system 09/15/2011   Essential hypertension 09/11/2011   Dysthymic disorder 09/11/2011   Hypothyroidism 09/11/2011   Major depressive disorder, recurrent episode, moderate ([place]) 09/11/2011   Dyslipidemia 03/18/2011     Physical Exam  Triage Vital Signs: ED Triage Vitals  Enc Vitals Group     BP 01/14/22 0844 133/80     Pulse Rate 01/14/22 0844 (!) 144     Resp 01/14/22 0844 (!) 21     Temp --      Temp src --      SpO2 01/14/22 0844 100 %     Weight 01/14/22 0841 125 lb (56.7 kg)     Height --      Head Circumference --      Peak Flow --      Pain Score 01/14/22 0841 0     Pain Loc --      Pain Edu? --      Excl. in [place]? --     Most recent vital signs: Vitals:   01/14/22 1330 01/14/22 1400  BP: (!) 141/120 (!) 139/102  Pulse: (!) 106 (!) 112  Resp: (!) 21 13  Temp:    SpO2: 99% 96%     General: Awake, no distress.  Chronically ill-appearing CV:  Good peripheral perfusion.  No peripheral edema Resp:  Normal effort.  Patient is on BiPAP with prolonged expiratory phase but is able to speak she has decreased breath sounds throughout some crackles at the bases no overt wheezing Abd:  No distention.  Abdomen mildly tender in the suprapubic region Neuro:             Awake, Alert, Oriented x 3  Other:     ED Results / Procedures / Treatments  Labs (all labs ordered are listed, but only abnormal results are displayed) Labs Reviewed  COMPREHENSIVE METABOLIC PANEL - Abnormal; Notable for the following components:      Result Value   Glucose, Bld 321 (*)    Creatinine, Ser 1.05 (*)    Albumin 3.2 (*)    GFR, Estimated 54 (*)    All other components within normal limits  CBC WITH DIFFERENTIAL/PLATELET - Abnormal; Notable for the following components:   RBC 3.69 (*)    Hemoglobin 11.2 (*)    MCV 101.4 (*)    MCHC 29.9 (*)    All other components within normal limits  BRAIN NATRIURETIC PEPTIDE - Abnormal; Notable for the following  components:   B Natriuretic Peptide 3,313.3 (*)    All other components within normal limits  BLOOD GAS, VENOUS - Abnormal; Notable for the following components:   pCO2, Ven 64 (*)    pO2, Ven 28 (*)    Bicarbonate 31.5 (*)    Acid-Base Excess 3.6 (*)    All other components within normal limits  TROPONIN I (HIGH SENSITIVITY) - Abnormal; Notable for the following components:   Troponin I (High Sensitivity) 19 (*)    All other components within normal limits  TROPONIN I (HIGH SENSITIVITY) - Abnormal; Notable for the following components:   Troponin I (High Sensitivity) 21 (*)    All other components within normal limits  SARS CORONAVIRUS 2 BY RT PCR  URINALYSIS, ROUTINE W REFLEX MICROSCOPIC  PROCALCITONIN     EKG  EKG reviewed and interpreted by myself shows atrial fibrillation with RVR left bundle branch block no [place] criteria   RADIOLOGY X-ray reviewed interpreted by myself shows pulmonary edema cardiomegaly   PROCEDURES:  Critical Care performed: Yes, see critical care procedure note(s)  .1-3 Lead EKG Interpretation  Performed by: Rada Hay, MD Authorized by: Rada Hay, MD     Interpretation: abnormal     ECG rate assessment: tachycardic     Rhythm: atrial fibrillation     Ectopy: none     Conduction: normal   .Critical Care  Performed by: Rada Hay, MD Authorized by: Rada Hay, MD   Critical care provider statement:    Critical care time (minutes):  60   Critical care was time spent personally by me on the following activities:  Development of treatment plan with patient or surrogate, discussions with consultants, evaluation of patient's response to treatment, examination of patient, ordering and review of laboratory studies, ordering and review of radiographic studies, ordering and performing treatments and interventions, pulse oximetry, re-evaluation of patient's condition and review of old charts   The patient is on  the cardiac monitor to evaluate for evidence of arrhythmia and/or significant heart rate changes.   MEDICATIONS ORDERED IN ED: Medications  metoprolol succinate (TOPROL-XL) 24 hr tablet 75 mg (75 mg Oral Given 01/14/22 1210)  diltiazem (CARDIZEM) 125 mg in dextrose 5% 125 mL (1 mg/mL) infusion (7.5 mg/hr Intravenous Rate/Dose Change 01/14/22 1503)  levothyroxine (SYNTHROID) tablet 137 mcg (has no administration in time range)  acetaminophen (TYLENOL) tablet 650 mg (has no administration in time range)    Or  acetaminophen (TYLENOL) suppository 650 mg (has no administration in time range)  ondansetron (ZOFRAN) tablet 4 mg (has no administration in time range)    Or  ondansetron (ZOFRAN) injection 4 mg (has no administration in time range)  senna-docusate (Senokot-S) tablet 1 tablet (has no administration in time range)  insulin aspart (novoLOG) injection 0-5 Units (has no administration in time range)  insulin aspart (novoLOG) injection 0-20 Units (has no administration in time range)  nicotine (NICODERM CQ - dosed in mg/24 hours) patch 21 mg (has no administration in time range)  ipratropium-albuterol (DUONEB) 0.5-2.5 (3) MG/3ML nebulizer solution 3 mL (has no administration in time range)  furosemide (LASIX) injection 60 mg (has no administration in time range)  diltiazem (CARDIZEM) injection 10 mg (10 mg Intravenous Given 01/14/22 0919)  furosemide (LASIX) injection 40 mg (40 mg Intravenous Given 01/14/22 1019)  ipratropium-albuterol (DUONEB) 0.5-2.5 (3) MG/3ML nebulizer solution 3 mL (3 mLs Nebulization Given 01/14/22 1036)  ipratropium-albuterol (DUONEB) 0.5-2.5 (3) MG/3ML nebulizer solution 3 mL (3 mLs Nebulization Given 01/14/22 1036)     IMPRESSION / MDM / ASSESSMENT AND PLAN / ED COURSE  I reviewed the triage vital signs and the nursing notes.                              Patient's presentation is most consistent with acute presentation with potential threat to life or bodily  function.  Differential diagnosis includes, but is not limited to, COPD exacerbation, CHF exacerbation, atrial fibrillation with RVR induced shortness  of breath, pneumonia, pleural effusion, pneumothorax  The patient is a 81 year old female with complex past medical history with low COPD CHF A-fib that has been difficult to control who presents with respiratory distress.  She was just discharged from [facility] yesterday after admission for respiratory failure and A-fib with RVR and hematochezia.  She was not able to get a cardioversion because TEE showed left atrial thrombus.  Her daughters tell me that she started being short of breath yesterday when she was discharged and then this worsened this morning.  She was discharged on oxygen and was taking it this morning.  Apparently was satting 86% unclear if this is on her home O2 or on room air for EMS.  She was placed on CPAP.  On arrival to ED she looks much more comfortable she has some rales no overt wheezing but breath sounds are diminished significantly.  Does not appear volume overloaded.  She is mentating okay says she is feeling improved.  Patient's BNP is quite elevated troponin mildly elevated at 19 which will repeat.  Hemoglobin near baseline creatinine at baseline.  She is mildly hyperglycemic to 320.  VBG showing mild hypercarbia without significant metabolic acidosis with PCO2 64.  Patient given 10 of diltiazem for her A-fib with RVR heart rates did improve.  Then given p.o. metoprolol.  Heart rates did not then increase back up to the 130s to started on diltiazem drip.  Chest x-ray showing some mild pulmonary edema.  She is also given Lasix.  Patient's family requested transfer to [facility].  I did reach out to [facility] and Dr. The transfer center who then talked to the [facility] but unfortunately they are affect capacity and unable to accept the patient.  Explained to this to the patient's family and they were okay with staying at [facility].  She remains  on diltiazem drip.  I have discussed with Dr. Tobie Poet who will admit the patient.   FINAL CLINICAL IMPRESSION(S) / ED DIAGNOSES   Final diagnoses:  Atrial fibrillation with RVR ([place])     Rx / DC Orders   ED Discharge Orders     None        Note:  This document was prepared using Dragon voice recognition software and may include unintentional dictation errors.   Rada Hay, MD 01/14/22 1539

## 2022-01-14 NOTE — Assessment & Plan Note (Signed)
-   Bupropion 100 mg p.o. twice daily, venlafaxine 75 mg p.o. twice daily

## 2022-01-14 NOTE — Assessment & Plan Note (Signed)
-   PPI twice daily before meals

## 2022-01-14 NOTE — Assessment & Plan Note (Signed)
-   Insulin SSI with at bedtime coverage - Goal inpatient blood glucose levels 140-180

## 2022-01-14 NOTE — H&P (Addendum)
History and Physical   Christine Obrien:[id number] DOB: Aug 18, 1940 DOA: 01/14/2022  PCP: [name], MD Outpatient Specialists: Dr. Stevphen Meuse clinic cardiology Patient coming from: Home via EMS  I have personally briefly reviewed patient's old medical records in [place].  Chief Concern: Shortness of breath  HPI: [name] is a 81 year old female with history of atrial fibrillation on anticoagulation, depression, anxiety, hypothyroid, non-insulin-dependent diabetes mellitus, hypertension, GERD, neuropathy, who presents emergency department for chief concerns of shortness of breath.  Initial vitals in the emergency department showed temperature of 96.6 and increased to 98.4, respiration rate of 18, heart rate of 100, blood pressure 119/75, SPO2 100% on BiPAP.  VBG: 7.30/64/28 on bilevel positive airway pressure  Serum sodium is 137, potassium 4.8, chloride 103, bicarb 27, BUN of 22, serum creatinine 1.05, eGFR 54, nonfasting glucose 321, WBC 7.7, hemoglobin 11.2, platelets of 234.  BNP was 3313.3.  High sensitive troponin was 19 and increased to 21 on recheck.  COVID PCR was negative.  ED treatment: Diltiazem 10 mg IV, furosemide 40 mg IV, DuoNebs x2, metoprolol 75 mg p.o. one-time dose, diltiazem gtt. initiated.  At bedside patient is able to tell me her full name, her age, the current calendar year, she knows she is in the hospital and she is able to identify both her daughters at bedside.  She reports that she was progressively improving when she was at [facility]  On the evening of discharge, patient reports worsening shortness of breath along with palpitations.  She reports that she received all her p.o., evening medications prior to leaving [facility] on 01/13/2022.  On morning of admission, patient reports that she did not take any of her medication because of worsening shortness of breath and palpitations.  EMS was called to the transport her to the  hospital.  At bedside she does not appear to be in acute distress, stable, and on 5 L nasal cannula and able to answer most questions.  She endorses dysuria this AM.  She endorses diarrhea, 4 episodes on evening of 01/12/2022 at home.  She denies trauma to her person.  Social history: She is a former tobacco user, quitting one month ago. She smoked 1 ppd. She denies etoh and recreational drug use,   ROS: Constitutional: no weight change, no fever ENT/Mouth: no sore throat, no rhinorrhea Eyes: no eye pain, no vision changes Cardiovascular: no chest pain, no dyspnea,  no edema, no palpitations Respiratory: no cough, no sputum, no wheezing Gastrointestinal: no nausea, no vomiting, no diarrhea, no constipation Genitourinary: no urinary incontinence, no dysuria, no hematuria Musculoskeletal: no arthralgias, no myalgias Skin: no skin lesions, no pruritus, Neuro: + weakness, no loss of consciousness, no syncope Psych: no anxiety, no depression, + decrease appetite Heme/Lymph: no bruising, no bleeding  ED Course: Discussed with emergency medicine provider, patient requiring hospitalization for chief concerns of acute hypoxic respiratory failure with atrial fibrillation RVR.  Assessment/Plan  Principal Problem:   Acute on chronic respiratory failure with hypoxia (HCC) Active Problems:   Elevated troponin   Anxiety, generalized   Essential hypertension   Gastroesophageal reflux disease without esophagitis   Dyslipidemia   Hypothyroidism   S/P CABG x 2   Major depressive disorder, recurrent episode, moderate (HCC)   Type 2 diabetes mellitus without complications ([place])   ICD (implantable cardioverter-defibrillator) in place   Acute systolic CHF (congestive heart failure) (HCC)   COPD exacerbation (HCC)   PAF (paroxysmal atrial fibrillation) ([place])   Assessment and  Plan:  * Acute on chronic respiratory failure with hypoxia (HCC) With hypercapnia - Presumed secondary to fluid overload  in setting of heart failure exacerbation complicated by COPD in a patient with history of tobacco dependence - Strict I's and O's - Status post furosemide 40 mg IV per EDP - Continue bilevel positive airway pressure - Check procalcitonin stat - Admit to stepdown, inpatient  Elevated troponin - Patient does not have chest pain at bedside - No ST/ischemic changes on EKG - Presumptive etiology is secondary to acute heart failure exacerbation causing demand ischemia  PAF (paroxysmal atrial fibrillation) (HCC) - Currently in RVR, presumptive etiology is secondary to heart failure exacerbation - Patient had a TEE at [facility] on 01/10/2022: Clear left atrial appendage thrombus visualized.  Cardioversion was not proceeded at that time.  Severely reduced left ventricular function with LVEF estimated at 30%.  Moderately reduced right ventricular function similar to prior TEE. - Treat per heart failure exacerbation  Acute systolic CHF (congestive heart failure) (HCC) Acute on chronic heart failure with reduced ejection fraction - Strict I's and O's - Additional furosemide 60 mg IV one-time dose ordered for 01/15/2022  Type 2 diabetes mellitus without complications (HCC) - Insulin SSI with at bedtime coverage - Goal inpatient blood glucose levels 140-180  Major depressive disorder, recurrent episode, moderate (HCC) - Bupropion 100 mg p.o. twice daily, venlafaxine 75 mg p.o. twice daily  Hypothyroidism - Levothyroxine 137 mcg daily resumed  Dyslipidemia - Ezetimibe 10 mg daily, Vascepa 1 g daily resumed  Gastroesophageal reflux disease without esophagitis - PPI twice daily before meals  Essential hypertension - Resumed home metoprolol succinate 75 mg p.o. twice daily - Currently on diltiazem drip  Anxiety, generalized - Bupropion 100 mg p.o. twice daily, venlafaxine 75 mg p.o. twice daily resumed  Goals of care discussion-brief discussion made with daughters at bedside.  Given patient's  multiple chronic medical diagnoses and heart failure with multiple hospital admissions that are prolonged - Cardiology consultation was offered, patient daughter declined at this time stating they would like to speak with palliative first - Palliative consultation placed  Chart reviewed.   DVT prophylaxis: Apixaban 2.5 mg p.o. twice daily Code Status: full code  Diet: Heart healthy/carb modified Family Communication: updated daughters, Celine Mans.  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)    Cancer (HCC)    cervical   Carotid artery occlusion    CHF (congestive heart failure), NYHA class III (HCC)    CKD (chronic kidney disease), stage III (HCC)    COPD (chronic obstructive pulmonary disease) (HCC)    Coronary artery disease    Current use of long term anticoagulation    Depression    Diabetes mellitus without complication (HCC)    GERD (gastroesophageal reflux disease)    Gout    HLD (hyperlipidemia)    Hx of CABG    Hypertension    Hypothyroidism    Kidney stones    LBBB (left bundle branch block)    Myocardial infarction ([place])    Presence of permanent cardiac pacemaker    Subclavian steal syndrome    Past Surgical History:  Procedure Laterality Date   ABDOMINAL HYSTERECTOMY     partial   CHOLECYSTECTOMY     COLONOSCOPY WITH PROPOFOL N/A 01/11/2020  Procedure: COLONOSCOPY WITH PROPOFOL;  Surgeon: Toledo, Benay Pike, MD;  Location: [facility] ENDOSCOPY;  Service: Gastroenterology;  Laterality: N/A;   CORONARY ANGIOPLASTY     CORONARY ARTERY BYPASS GRAFT     double   ENDARTERECTOMY Right 05/08/2021   Procedure: ENDARTERECTOMY CAROTID;  Surgeon: Algernon Huxley, MD;  Location: [facility] ORS;  Service:  Vascular;  Laterality: Right;   EYE SURGERY Bilateral 08/2009   cataracts   HIATAL HERNIA REPAIR  1980s   LEFT HEART CATH AND CORONARY ANGIOGRAPHY N/A 07/03/2017   Procedure: LEFT HEART CATH AND CORONARY ANGIOGRAPHY;  Surgeon: Corey Skains, MD;  Location: [place] CV LAB;  Service: Cardiovascular;  Laterality: N/A;   LOWER EXTREMITY ANGIOGRAPHY Left 01/11/2018   Procedure: LOWER EXTREMITY ANGIOGRAPHY;  Surgeon: Algernon Huxley, MD;  Location: [place] CV LAB;  Service: Cardiovascular;  Laterality: Left;   THYROID SURGERY     pt does not remember if removed or partial removal   Social History:  reports that she has been smoking cigarettes. She has been smoking an average of 1 pack per day. She has never used smokeless tobacco. She reports that she does not drink alcohol and does not use drugs.  Allergies  Allergen Reactions   Sulfur Dioxide Other (See Comments)    Other reaction(s): abdominal pain   Ace Inhibitors     Other reaction(s): Cough   Atorvastatin     Other reaction(s): Muscle Pain   Statins     Other reaction(s): Muscle Pain Muscle pain   Sulfa Antibiotics     Stomach pains   Family History  Problem Relation Age of Onset   Hypertension Mother    Hypertension Father    Diabetes Sister    Cancer Brother    Diabetes Brother    Breast cancer Neg Hx    Family history: Family history reviewed and not pertinent.  Prior to Admission medications   Medication Sig Start Date End Date Taking? Authorizing Provider  Acetaminophen (TYLENOL PO) Take 500 mg by mouth.     [provider]  albuterol (VENTOLIN HFA) 108 (90 Base) MCG/ACT inhaler SMARTSIG:2 Inhalation Via Inhaler Every 4 Hours PRN 01/03/21   [provider]  apixaban (ELIQUIS) 5 MG TABS tablet Take 5 mg by mouth 2 (two) times daily. 03/05/20   [provider]  aspirin EC 81 MG tablet Take 1 tablet (81 mg total) by mouth daily. Swallow whole. 05/09/21   Kris Hartmann, NP  buPROPion  ER [facility] SR) 100 MG 12 hr tablet Take 100 mg by mouth 2 (two) times daily. 01/15/21   [provider]  empagliflozin (JARDIANCE) 10 MG TABS tablet Take 10 mg by mouth daily.    [provider]  ezetimibe (ZETIA) 10 MG tablet Take 10 mg by mouth daily. 08/20/17 12/23/21  [provider]  ferrous sulfate 325 (65 FE) MG tablet Take 325 mg by mouth daily with breakfast. 03/09/16   [provider]  fluticasone (FLONASE) 50 MCG/ACT nasal spray Place 2 sprays into the nose daily.    [provider]  fluticasone-salmeterol (ADVAIR) 250-50 MCG/ACT AEPB Inhale 1 puff into the lungs in the morning and at bedtime.    [provider]  furosemide (LASIX) 20 MG tablet Take 1 tablet (20 mg total) by mouth daily. 07/25/20   Fritzi Mandes, MD  gabapentin (NEURONTIN) 100 MG capsule Take 100 mg by mouth 3 (three) times daily as needed. 07/05/21   [provider]  glipiZIDE ([place])  5 MG tablet Take 5 mg by mouth 2 (two) times daily before a meal.    [provider]  icosapent Ethyl (VASCEPA) 1 g capsule Take 1 g by mouth 2 (two) times daily with a meal. 11/28/21   [provider]  ipratropium-albuterol (DUONEB) 0.5-2.5 (3) MG/3ML SOLN Take 3 mLs by nebulization 2 (two) times daily. 12/13/21   Fritzi Mandes, MD  levothyroxine (SYNTHROID) 137 MCG tablet Take 1 tablet (137 mcg total) by mouth daily. 07/25/20   Fritzi Mandes, MD  loratadine (CLARITIN) 10 MG tablet Take 10 mg by mouth daily. 08/04/18   [provider]  losartan (COZAAR) 25 MG tablet Take 25 mg by mouth daily. 12/02/18 12/23/21  [provider]  metFORMIN (GLUCOPHAGE-XR) 500 MG 24 hr tablet Take 500 mg by mouth 2 (two) times daily. 09/26/21   [provider]  metoprolol tartrate (LOPRESSOR) 25 MG tablet Take 50 mg by mouth 2 (two) times daily. 12/19/21   [provider]  Multiple Vitamin (MULTIVITAMIN) capsule Take 1 capsule by mouth daily.    [provider]  omega-3 acid ethyl esters (LOVAZA) 1 g capsule Take 1 g by mouth 2 (two) times daily.    [provider]  omeprazole (PRILOSEC) 20 MG capsule Take 20 mg by mouth 2 (two) times daily before a meal. 01/09/16   [provider]  ondansetron (ZOFRAN-ODT) 4 MG disintegrating tablet Take 4 mg by mouth 3 (three) times daily as needed for nausea. 12/18/21   [provider]  venlafaxine (EFFEXOR) 75 MG tablet Take 75 mg by mouth 2 (two) times daily with a meal.    [provider]   Physical Exam: Vitals:   01/14/22 1630 01/14/22 1700 01/14/22 1730 01/14/22 1756  BP: 122/87 126/76 (!) 126/105   Pulse: 96 99 97   Resp: '20 19 14   ' Temp:    98.8 F (37.1 C)  TempSrc:    Oral  SpO2: 94% 98% 97%   Weight:       Constitutional: appears age-appropriate, frail, NAD, calm, comfortable Eyes: PERRL, lids and conjunctivae normal ENMT: Mucous membranes are moist. Posterior pharynx clear of any exudate or lesions. Age-appropriate dentition. Hearing appropriate Neck: normal, supple, no masses, no thyromegaly Respiratory: clear to auscultation bilaterally, no wheezing, no crackles.  Mild increased respiratory effort.  Mild increase accessory muscle use.  5 L nasal cannula Cardiovascular: Regular rate and rhythm, no murmurs / rubs / gallops. No extremity edema. 2+ pedal pulses. No carotid bruits.  Abdomen: no tenderness, no masses palpated, no hepatosplenomegaly. Bowel sounds positive.  Musculoskeletal: no clubbing / cyanosis. No joint deformity upper and lower extremities. Good ROM, no contractures, no atrophy. Normal muscle tone.  Skin: no rashes, lesions, ulcers. No induration Neurologic: Sensation intact. Strength 5/5 in all 4.  Psychiatric: Normal judgment and insight. Alert and oriented x 3. Normal mood.   EKG: independently reviewed, showing sinus tachycardia, ectopic atrial tachycardia with rate of 113, QTc 508  Chest x-ray on Admission: I personally  reviewed and I agree with radiologist reading as below.  US Venous Img Lower Bilateral (DVT)  Result Date: 01/14/2022 CLINICAL DATA:  Leg pain x1 day. EXAM: BILATERAL LOWER EXTREMITY VENOUS DOPPLER ULTRASOUND TECHNIQUE: Gray-scale sonography with graded compression, as well as color Doppler and duplex ultrasound were performed to evaluate the lower extremity deep venous systems from the level of the common femoral vein and including the common femoral, femoral, profunda femoral, popliteal and calf veins including the posterior  tibial, peroneal and gastrocnemius veins when visible. The superficial great saphenous vein was also interrogated. Spectral Doppler was utilized to evaluate flow at rest and with distal augmentation maneuvers in the common femoral, femoral and popliteal veins. COMPARISON:  None Available. FINDINGS: RIGHT LOWER EXTREMITY Common Femoral Vein: No evidence of thrombus. Normal compressibility, respiratory phasicity and response to augmentation. Saphenofemoral Junction: No evidence of thrombus. Normal compressibility and flow on color Doppler imaging. Profunda Femoral Vein: No evidence of thrombus. Normal compressibility and flow on color Doppler imaging. Femoral Vein: No evidence of thrombus. Normal compressibility, respiratory phasicity and response to augmentation. Popliteal Vein: No evidence of thrombus. Normal compressibility, respiratory phasicity and response to augmentation. Calf Veins: No evidence of thrombus. Normal compressibility and flow on color Doppler imaging. Superficial Great Saphenous Vein: No evidence of thrombus. Normal compressibility. Venous Reflux:  None. Other Findings:  None. LEFT LOWER EXTREMITY Common Femoral Vein: No evidence of thrombus. Normal compressibility, respiratory phasicity and response to augmentation. Saphenofemoral Junction: No evidence of thrombus. Normal compressibility and flow on color Doppler imaging. Profunda Femoral Vein: No evidence of thrombus.  Normal compressibility and flow on color Doppler imaging. Femoral Vein: No evidence of thrombus. Normal compressibility, respiratory phasicity and response to augmentation. Popliteal Vein: No evidence of thrombus. Normal compressibility, respiratory phasicity and response to augmentation. Calf Veins: No evidence of thrombus. Normal compressibility and flow on color Doppler imaging. Superficial Great Saphenous Vein: No evidence of thrombus. Normal compressibility. Venous Reflux:  None. Other Findings:  None. IMPRESSION: No evidence of deep venous thrombosis in either lower extremity. Electronically Signed   By: Virgina Norfolk M.D.   On: 01/14/2022 17:23   DG Chest Portable 1 View  Result Date: 01/14/2022 CLINICAL DATA:  81 year old female with shortness of breath. Atrial fibrillation. EXAM: PORTABLE CHEST 1 VIEW COMPARISON:  Portable chest 12/23/2021 and earlier. FINDINGS: Portable AP upright view at 0910 hours. Stable left chest cardiac AICD. Cardiomegaly is not significantly changed. Prior CABG. Stable lung volumes. Stable pulmonary vascularity without overt edema. But veiling bilateral lung base opacity is more apparent. Still, only small pleural effusions. No pneumothorax or consolidation. Stable visualized osseous structures. IMPRESSION: 1. Cardiomegaly with small bilateral pleural effusions, perhaps increased since 12/23/2021. 2. No overt edema or other acute cardiopulmonary abnormality. Electronically Signed   By: Genevie Ann M.D.   On: 01/14/2022 09:29    Labs on Admission: I have personally reviewed following labs  CBC: Recent Labs  Lab 01/14/22 0846  WBC 7.7  NEUTROABS 5.9  HGB 11.2*  HCT 37.4  MCV 101.4*  PLT 449   Basic Metabolic Panel: Recent Labs  Lab 01/14/22 0846  NA 137  K 4.8  CL 103  CO2 27  GLUCOSE 321*  BUN 22  CREATININE 1.05*  CALCIUM 9.7   GFR: Estimated Creatinine Clearance: 35.3 mL/min (A) (by C-G formula based on SCr of 1.05 mg/dL (H)).  Liver Function  Tests: Recent Labs  Lab 01/14/22 0846  AST 27  ALT 28  ALKPHOS 72  BILITOT 0.8  PROT 6.8  ALBUMIN 3.2*   Urine analysis:    Component Value Date/Time   COLORURINE YELLOW (A) 12/24/2021 0127   APPEARANCEUR CLEAR (A) 12/24/2021 0127   APPEARANCEUR Clear 03/31/2013 0011   LABSPEC 1.015 12/24/2021 0127   LABSPEC 1.005 03/31/2013 0011   PHURINE 5.0 12/24/2021 0127   GLUCOSEU >=500 (A) 12/24/2021 0127   GLUCOSEU Negative 03/31/2013 0011   HGBUR NEGATIVE 12/24/2021 0127   BILIRUBINUR NEGATIVE 12/24/2021 0127   BILIRUBINUR Negative  03/31/2013 0011   KETONESUR NEGATIVE 12/24/2021 0127   PROTEINUR NEGATIVE 12/24/2021 0127   NITRITE NEGATIVE 12/24/2021 0127   LEUKOCYTESUR NEGATIVE 12/24/2021 0127   LEUKOCYTESUR Negative 03/31/2013 0011   CRITICAL CARE Performed by: Dr. Tobie Poet  Total critical care time: 40 minutes  Critical care time was exclusive of separately billable procedures and treating other patients.  Critical care was necessary to treat or prevent imminent or life-threatening deterioration.  Critical care was time spent personally by me on the following activities: development of treatment plan with patient and/or surrogate as well as nursing, discussions with consultants, evaluation of patient's response to treatment, examination of patient, obtaining history from patient or surrogate, ordering and performing treatments and interventions, ordering and review of laboratory studies, ordering and review of radiographic studies, pulse oximetry and re-evaluation of patient's condition.  Dr. Tobie Poet [facility]  If 7PM-7AM, please contact overnight-coverage provider If 7AM-7PM, please contact day coverage provider [email or web address]  01/14/2022, 6:24 PM

## 2022-01-14 NOTE — Assessment & Plan Note (Signed)
With hypercapnia - Presumed secondary to fluid overload in setting of heart failure exacerbation complicated by COPD in a patient with history of tobacco dependence - Strict I's and O's - Status post furosemide 40 mg IV per EDP - Continue bilevel positive airway pressure - Check procalcitonin stat - Admit to stepdown, inpatient

## 2022-01-14 NOTE — ED Notes (Signed)
Patient placed on pure wick °

## 2022-01-15 ENCOUNTER — Other Ambulatory Visit: Payer: Self-pay

## 2022-01-15 DIAGNOSIS — I5021 Acute systolic (congestive) heart failure: Secondary | ICD-10-CM

## 2022-01-15 DIAGNOSIS — J441 Chronic obstructive pulmonary disease with (acute) exacerbation: Secondary | ICD-10-CM

## 2022-01-15 DIAGNOSIS — Z515 Encounter for palliative care: Secondary | ICD-10-CM

## 2022-01-15 DIAGNOSIS — Z951 Presence of aortocoronary bypass graft: Secondary | ICD-10-CM

## 2022-01-15 DIAGNOSIS — I4891 Unspecified atrial fibrillation: Secondary | ICD-10-CM | POA: Diagnosis not present

## 2022-01-15 DIAGNOSIS — F411 Generalized anxiety disorder: Secondary | ICD-10-CM | POA: Diagnosis not present

## 2022-01-15 DIAGNOSIS — J9621 Acute and chronic respiratory failure with hypoxia: Secondary | ICD-10-CM | POA: Diagnosis not present

## 2022-01-15 DIAGNOSIS — I480 Paroxysmal atrial fibrillation: Secondary | ICD-10-CM

## 2022-01-15 DIAGNOSIS — Z95810 Presence of automatic (implantable) cardiac defibrillator: Secondary | ICD-10-CM

## 2022-01-15 DIAGNOSIS — Z7189 Other specified counseling: Secondary | ICD-10-CM

## 2022-01-15 LAB — BASIC METABOLIC PANEL
Anion gap: 7 (ref 5–15)
BUN: 18 mg/dL (ref 8–23)
CO2: 29 mmol/L (ref 22–32)
Calcium: 8.6 mg/dL — ABNORMAL LOW (ref 8.9–10.3)
Chloride: 105 mmol/L (ref 98–111)
Creatinine, Ser: 0.91 mg/dL (ref 0.44–1.00)
GFR, Estimated: >60 mL/min (ref >60)
Glucose, Bld: 138 mg/dL — ABNORMAL HIGH (ref 70–99)
Potassium: 3.3 mmol/L — ABNORMAL LOW (ref 3.5–5.1)
Sodium: 141 mmol/L (ref 135–145)

## 2022-01-15 LAB — CBC
HCT: 33.7 % — ABNORMAL LOW (ref 36.0–46.0)
Hemoglobin: 10.4 g/dL — ABNORMAL LOW (ref 12.0–15.0)
MCH: 30.5 pg (ref 26.0–34.0)
MCHC: 30.9 g/dL (ref 30.0–36.0)
MCV: 98.8 fL (ref 80.0–100.0)
Platelets: 181 10*3/uL (ref 150–400)
RBC: 3.41 MIL/uL — ABNORMAL LOW (ref 3.87–5.11)
RDW: 14.8 % (ref 11.5–15.5)
WBC: 6.5 10*3/uL (ref 4.0–10.5)
nRBC: 0 % (ref 0.0–0.2)

## 2022-01-15 LAB — URINALYSIS, ROUTINE W REFLEX MICROSCOPIC
Bilirubin Urine: NEGATIVE
Glucose, UA: NEGATIVE mg/dL
Hgb urine dipstick: NEGATIVE
Ketones, ur: NEGATIVE mg/dL
Leukocytes,Ua: NEGATIVE
Nitrite: NEGATIVE
Protein, ur: NEGATIVE mg/dL
Specific Gravity, Urine: 1.009 (ref 1.005–1.030)
pH: 6 (ref 5.0–8.0)

## 2022-01-15 LAB — CBG MONITORING, ED: Glucose-Capillary: 159 mg/dL — ABNORMAL HIGH (ref 70–99)

## 2022-01-15 MED ORDER — ALBUTEROL SULFATE HFA 108 (90 BASE) MCG/ACT IN AERS: 2.0000 | INHALATION_SPRAY | RESPIRATORY_TRACT | 2 refills | Status: AC | PRN

## 2022-01-15 MED ORDER — ROPINIROLE HCL ER 4 MG PO TB24: 4.0000 mg | ORAL_TABLET | Freq: Every day | ORAL | 0 refills | Status: AC

## 2022-01-15 MED ORDER — ENSURE ENLIVE PO LIQD: 237.0000 mL | Freq: Two times a day (BID) | ORAL | Status: DC

## 2022-01-15 MED ORDER — NICOTINE 21 MG/24HR TD PT24: 21.0000 mg | MEDICATED_PATCH | Freq: Every day | TRANSDERMAL | 0 refills | Status: AC | PRN

## 2022-01-15 MED ORDER — LORAZEPAM 1 MG PO TABS: 1.0000 mg | ORAL_TABLET | Freq: Three times a day (TID) | ORAL | 0 refills | Status: AC | PRN

## 2022-01-15 MED ORDER — FUROSEMIDE 10 MG/ML IJ SOLN
40.0000 mg | Freq: Every day | INTRAMUSCULAR | Status: AC
  Administered 2022-01-15: 40 mg via INTRAVENOUS

## 2022-01-15 MED ORDER — FUROSEMIDE 20 MG PO TABS: 40.0000 mg | ORAL_TABLET | Freq: Two times a day (BID) | ORAL | 0 refills | Status: AC

## 2022-01-15 MED ORDER — POTASSIUM CHLORIDE 20 MEQ PO PACK
40.0000 meq | PACK | Freq: Once | ORAL | Status: AC
  Administered 2022-01-15: 40 meq via ORAL
  Filled 2022-01-15: qty 2

## 2022-01-15 NOTE — Progress Notes (Signed)
[facility] [employer] [facility]) Hospital Liaison note:  This is a pending outpatient-based Palliative Care patient. Will continue to follow for disposition.  Please call with any outpatient palliative questions or concerns.  Thank you, Lorelee Market, LPN [facility] Liaison [phone]

## 2022-01-15 NOTE — ED Notes (Signed)
Pt has 3 family members at bedside. Pt has no complaints. Hospice social worker at bedside to talk to pt and family. Will do CBG check and neb (which pt says she wants) once conversation complete to allow pt to participate.

## 2022-01-15 NOTE — Discharge Summary (Signed)
Physician Discharge Summary   Christine Obrien  female DOB: 08-Mar-1941  ZOX:[id number]  PCP: Christine Pitch, MD  Admit date: 01/14/2022 Discharge date: 01/15/2022  Admitted From: home Disposition:  home with hospice CODE STATUS: Full code   Hospital Course:  For full details, please see H&P, progress notes, consult notes and ancillary notes.  Briefly,  Christine Obrien is a 81 year old female with history of atrial fibrillation on anticoagulation, hypothyroid, non-insulin-dependent diabetes mellitus, hypertension, who presented emergency department for chief concerns of shortness of breath.  Pt's medical issues were being addressed and managed as below, however, next day while still in the ED, pt and family decided to forgo inpatient treatment and preferred to go home with hospice.  Hospice liaison met with pt and family.  I discussed with pt and family and decided on which medications to continue for comfort after discharge.  * Acute on chronic respiratory failure with hypoxia (HCC) and hypercapnia - Presumed secondary to fluid overload in setting of heart failure exacerbation complicated by COPD in a patient with history of tobacco dependence - Status post furosemide 40 mg IV per EDP --pt was discharged on oral Lasix 40 BID.  Acute systolic CHF (congestive heart failure) ([place]) Severely reduced left ventricular function with LVEF estimated at 30% on TEE on 01/10/22. -Status post furosemide 40 mg IV per EDP --pt was discharged on oral Lasix 40 BID.   PAF (paroxysmal atrial fibrillation) with RVR - secondary to heart failure exacerbation - Patient had a TEE at [facility] on 01/10/2022: Clear left atrial appendage thrombus visualized.  Cardioversion was not proceeded at that time.   --received dilt gtt in the ED --cont Toprol and Eliquis  Elevated troponin 2/2 demand ischemia - Patient does not have chest pain at bedside - No ST/ischemic changes on EKG - Presumptive etiology is secondary to  acute heart failure exacerbation causing demand ischemia   Type 2 diabetes mellitus without complications (HCC) --d/c home hypoglycemics since pt is comfort care   Hypothyroidism - Levothyroxine 137 mcg daily resumed   Dyslipidemia - d/c Ezetimibe and Vascepa due to comfort care status   Gastroesophageal reflux disease without esophagitis - PPI twice daily before meals   Essential hypertension --cont metop   Anxiety and depression - cont Bupropion 100 mg p.o. twice daily, venlafaxine 75 mg p.o. twice daily   CKD 3 ruled out   Discharge Diagnoses:  Principal Problem:   Acute on chronic respiratory failure with hypoxia (HCC) Active Problems:   Elevated troponin   Anxiety, generalized   Essential hypertension   Gastroesophageal reflux disease without esophagitis   Dyslipidemia   Hypothyroidism   S/P CABG x 2   Major depressive disorder, recurrent episode, moderate (HCC)   Type 2 diabetes mellitus without complications (HCC)   ICD (implantable cardioverter-defibrillator) in place   Acute systolic CHF (congestive heart failure) (HCC)   COPD exacerbation (HCC)   PAF (paroxysmal atrial fibrillation) ([place])   30 Day Unplanned Readmission Risk Score    Flowsheet Row ED from 01/14/2022 in [facility]  30 Day Unplanned Readmission Risk Score (%) 22.81 Filed at 01/15/2022 1200       This score is the patient's risk of an unplanned readmission within 30 days of being discharged (0 -100%). The score is based on dignosis, age, lab data, medications, orders, and past utilization.   Low:  0-14.9   Medium: 15-21.9   High: 22-29.9   Extreme: 30 and above  Discharge Instructions:  Allergies as of 01/15/2022       Reactions   Sulfur Dioxide Other (See Comments)   Other reaction(s): abdominal pain   Ace Inhibitors    Other reaction(s): Cough   Atorvastatin    Other reaction(s): Muscle Pain   Statins    Other reaction(s):  Muscle Pain Muscle pain   Sulfa Antibiotics    Stomach pains        Medication List     STOP taking these medications    aspirin EC 81 MG tablet   empagliflozin 10 MG Tabs tablet Commonly known as: JARDIANCE   ezetimibe 10 MG tablet Commonly known as: ZETIA   glipiZIDE 5 MG tablet Commonly known as: GLUCOTROL   icosapent Ethyl 1 g capsule Commonly known as: VASCEPA   losartan 25 MG tablet Commonly known as: COZAAR   metFORMIN 500 MG 24 hr tablet Commonly known as: GLUCOPHAGE-XR   multivitamin capsule       TAKE these medications    acetaminophen 500 MG tablet Commonly known as: TYLENOL Take 1,000 mg by mouth 2 (two) times daily as needed for mild pain or moderate pain.   albuterol 108 (90 Base) MCG/ACT inhaler Commonly known as: VENTOLIN HFA Inhale 2 puffs into the lungs every 4 (four) hours as needed for wheezing or shortness of breath.   apixaban 5 MG Tabs tablet Commonly known as: ELIQUIS Take 2.5 mg by mouth 2 (two) times daily.   buPROPion ER 100 MG 12 hr tablet Commonly known as: WELLBUTRIN SR Take 100 mg by mouth 2 (two) times daily.   fluticasone-salmeterol 250-50 MCG/ACT Aepb Commonly known as: ADVAIR Inhale 1 puff into the lungs every 12 (twelve) hours.   furosemide 20 MG tablet Commonly known as: LASIX Take 2 tablets (40 mg total) by mouth 2 (two) times daily. What changed:  how much to take when to take this   gabapentin 100 MG capsule Commonly known as: NEURONTIN Take 100 mg by mouth at bedtime.   ipratropium-albuterol 0.5-2.5 (3) MG/3ML Soln Commonly known as: DUONEB Take 3 mLs by nebulization 2 (two) times daily.   levothyroxine 137 MCG tablet Commonly known as: SYNTHROID Take 1 tablet (137 mcg total) by mouth daily.   loratadine 10 MG tablet Commonly known as: CLARITIN Take 10 mg by mouth daily.   LORazepam 1 MG tablet Commonly known as: ATIVAN Take 1 tablet (1 mg total) by mouth every 8 (eight) hours as needed for  up to 7 days for anxiety.   metoprolol succinate 25 MG 24 hr tablet Commonly known as: TOPROL-XL Take 75 mg by mouth 2 (two) times daily.   nicotine 21 mg/24hr patch Commonly known as: NICODERM CQ - dosed in mg/24 hours Place 1 patch (21 mg total) onto the skin daily as needed (Nicotine craving).   omeprazole 20 MG capsule Commonly known as: PRILOSEC Take 20 mg by mouth 2 (two) times daily before a meal.   rOPINIRole 4 MG 24 hr tablet Commonly known as: Requip XL Take 1 tablet (4 mg total) by mouth at bedtime. For restless leg.  Can increase to 2 tablets if 1 doesn't work.   venlafaxine XR 75 MG 24 hr capsule Commonly known as: EFFEXOR-XR Take 75 mg by mouth 2 (two) times daily.          Allergies  Allergen Reactions   Sulfur Dioxide Other (See Comments)    Other reaction(s): abdominal pain   Ace Inhibitors     Other reaction(s): Cough  Atorvastatin     Other reaction(s): Muscle Pain   Statins     Other reaction(s): Muscle Pain Muscle pain   Sulfa Antibiotics     Stomach pains     The results of significant diagnostics from this hospitalization (including imaging, microbiology, ancillary and laboratory) are listed below for reference.   Consultations:   Procedures/Studies: US Venous Img Lower Bilateral (DVT)  Result Date: 01/14/2022 CLINICAL DATA:  Leg pain x1 day. EXAM: BILATERAL LOWER EXTREMITY VENOUS DOPPLER ULTRASOUND TECHNIQUE: Gray-scale sonography with graded compression, as well as color Doppler and duplex ultrasound were performed to evaluate the lower extremity deep venous systems from the level of the common femoral vein and including the common femoral, femoral, profunda femoral, popliteal and calf veins including the posterior tibial, peroneal and gastrocnemius veins when visible. The superficial great saphenous vein was also interrogated. Spectral Doppler was utilized to evaluate flow at rest and with distal augmentation maneuvers in the common  femoral, femoral and popliteal veins. COMPARISON:  None Available. FINDINGS: RIGHT LOWER EXTREMITY Common Femoral Vein: No evidence of thrombus. Normal compressibility, respiratory phasicity and response to augmentation. Saphenofemoral Junction: No evidence of thrombus. Normal compressibility and flow on color Doppler imaging. Profunda Femoral Vein: No evidence of thrombus. Normal compressibility and flow on color Doppler imaging. Femoral Vein: No evidence of thrombus. Normal compressibility, respiratory phasicity and response to augmentation. Popliteal Vein: No evidence of thrombus. Normal compressibility, respiratory phasicity and response to augmentation. Calf Veins: No evidence of thrombus. Normal compressibility and flow on color Doppler imaging. Superficial Great Saphenous Vein: No evidence of thrombus. Normal compressibility. Venous Reflux:  None. Other Findings:  None. LEFT LOWER EXTREMITY Common Femoral Vein: No evidence of thrombus. Normal compressibility, respiratory phasicity and response to augmentation. Saphenofemoral Junction: No evidence of thrombus. Normal compressibility and flow on color Doppler imaging. Profunda Femoral Vein: No evidence of thrombus. Normal compressibility and flow on color Doppler imaging. Femoral Vein: No evidence of thrombus. Normal compressibility, respiratory phasicity and response to augmentation. Popliteal Vein: No evidence of thrombus. Normal compressibility, respiratory phasicity and response to augmentation. Calf Veins: No evidence of thrombus. Normal compressibility and flow on color Doppler imaging. Superficial Great Saphenous Vein: No evidence of thrombus. Normal compressibility. Venous Reflux:  None. Other Findings:  None. IMPRESSION: No evidence of deep venous thrombosis in either lower extremity. Electronically Signed   By: Virgina Norfolk M.D.   On: 01/14/2022 17:23   DG Chest Portable 1 View  Result Date: 01/14/2022 CLINICAL DATA:  81 year old female with  shortness of breath. Atrial fibrillation. EXAM: PORTABLE CHEST 1 VIEW COMPARISON:  Portable chest 12/23/2021 and earlier. FINDINGS: Portable AP upright view at 0910 hours. Stable left chest cardiac AICD. Cardiomegaly is not significantly changed. Prior CABG. Stable lung volumes. Stable pulmonary vascularity without overt edema. But veiling bilateral lung base opacity is more apparent. Still, only small pleural effusions. No pneumothorax or consolidation. Stable visualized osseous structures. IMPRESSION: 1. Cardiomegaly with small bilateral pleural effusions, perhaps increased since 12/23/2021. 2. No overt edema or other acute cardiopulmonary abnormality. Electronically Signed   By: Genevie Ann M.D.   On: 01/14/2022 09:29   CT HEAD WO CONTRAST (5MM)  Result Date: 12/23/2021 CLINICAL DATA:  Sudden severe headache. General malaise for several days. EKG with atrial fibrillation with RVR. EXAM: CT HEAD WITHOUT CONTRAST TECHNIQUE: Contiguous axial images were obtained from the base of the skull through the vertex without intravenous contrast. RADIATION DOSE REDUCTION: This exam was performed according to the departmental dose-optimization program which  includes automated exposure control, adjustment of the mA and/or kV according to patient size and/or use of iterative reconstruction technique. 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 No new asymmetry is seen concerning for an acute infarct, hemorrhage or mass. There is no midline shift. Basal cisterns are clear. Vascular: There are patchy calcifications in the carotid siphons but no hyperdense central vessels. Skull: Negative for fractures or focal lesions. Sinuses/Orbits: No acute findings. No mastoid effusions. Old lens extractions. Other: None. IMPRESSION: No acute intracranial CT  findings or interval changes. Chronic change. Electronically Signed   By: Telford Nab M.D.   On: 12/23/2021 21:37   CT ABDOMEN PELVIS W CONTRAST  Result Date: 12/23/2021 CLINICAL DATA:  Abdominal pain and fatigue.  Generalized malaise. EXAM: CT ABDOMEN AND PELVIS WITH CONTRAST TECHNIQUE: Multidetector CT imaging of the abdomen and pelvis was performed using the standard protocol following bolus administration of intravenous contrast. RADIATION DOSE REDUCTION: This exam was performed according to the departmental dose-optimization program which includes automated exposure control, adjustment of the mA and/or kV according to patient size and/or use of iterative reconstruction technique. CONTRAST:  10m OMNIPAQUE IOHEXOL 300 MG/ML  SOLN COMPARISON:  CT abdomen pelvis dated 06/12/2012. FINDINGS: Lower chest: Partially visualized small bilateral pleural effusions with partial compressive atelectasis of the lower lobes. Pneumonia is not excluded. Cardiac pacemaker wires noted. No intra-abdominal free air.  Trace free fluid within the pelvis. Hepatobiliary: Fatty liver. No biliary dilatation. Cholecystectomy. No retained calcified stone noted in the central CBD. Pancreas: Unremarkable. No pancreatic ductal dilatation or surrounding inflammatory changes. Spleen: Normal in size without focal abnormality. Adrenals/Urinary Tract: Bilateral adrenal thickening/hyperplasia or adenoma similar to prior CT. There is no hydronephrosis on either side. The visualized ureters and urinary bladder appear unremarkable. Stomach/Bowel: There is moderate stool throughout the colon. There is no bowel obstruction or active inflammation. The appendix is not visualized with certainty. No inflammatory changes identified in the right lower quadrant. Vascular/Lymphatic: Advanced aortoiliac atherosclerotic disease. Bilateral common iliac artery stent. The stents remain patent. There is a 3 cm infrarenal abdominal aortic aneurysm. The IVC is  unremarkable. No portal venous gas. There is no adenopathy. Reproductive: Hysterectomy.  No adnexal masses. Other: None Musculoskeletal: No acute or significant osseous findings. IMPRESSION: 1. No acute intra-abdominal or pelvic pathology. 2. Moderate colonic stool burden. No bowel obstruction. 3. Partially visualized small bilateral pleural effusions with partial compressive atelectasis of the lower lobes. Pneumonia is not excluded. 4. Aortic Atherosclerosis (ICD10-I70.0). Electronically Signed   By: AAnner CreteM.D.   On: 12/23/2021 21:34   DG Chest Portable 1 View  Result Date: 12/23/2021 CLINICAL DATA:  Shortness of breath, fatigue and malaise. EXAM: PORTABLE CHEST 1 VIEW COMPARISON:  Portable chest 12/12/21 FINDINGS: Left chest pacemaker/defibrillator and wiring are unchanged in appearance as well as CABG changes. The heart has mildly enlarged compared to the recent study. There is aortic atherosclerosis with stable mediastinum. There is mild perihilar vascular congestion, mild interstitial edema in the lower lung fields with small pleural effusions. The interstitial edema has improved in that it has regressed from the upper lung fields since prior study. No focal pneumonia is seen. In all other respects no further changes. Osteopenia and thoracic spondylosis. IMPRESSION: Cardiomegaly with perihilar vascular congestion and mild lower zonal interstitial edema, with small pleural effusions. No acute airspace infiltrate. Similar but interval improved findings  on the last study. Electronically Signed   By: Telford Nab M.D.   On: 12/23/2021 20:20      Labs: BNP (last 3 results) Recent Labs    12/10/21 0917 12/23/21 2004 01/14/22 0846  BNP 985.6* 2,319.7* 6,378.5*   Basic Metabolic Panel: Recent Labs  Lab 01/14/22 0846 01/15/22 0442  NA 137 141  K 4.8 3.3*  CL 103 105  CO2 27 29  GLUCOSE 321* 138*  BUN 22 18  CREATININE 1.05* 0.91  CALCIUM 9.7 8.6*   Liver Function Tests: Recent  Labs  Lab 01/14/22 0846  AST 27  ALT 28  ALKPHOS 72  BILITOT 0.8  PROT 6.8  ALBUMIN 3.2*   No results for input(s): "LIPASE", "AMYLASE" in the last 168 hours. No results for input(s): "AMMONIA" in the last 168 hours. CBC: Recent Labs  Lab 01/14/22 0846 01/15/22 0442  WBC 7.7 6.5  NEUTROABS 5.9  --   HGB 11.2* 10.4*  HCT 37.4 33.7*  MCV 101.4* 98.8  PLT 234 181   Cardiac Enzymes: No results for input(s): "CKTOTAL", "CKMB", "CKMBINDEX", "TROPONINI" in the last 168 hours. BNP: Invalid input(s): "POCBNP" CBG: Recent Labs  Lab 01/14/22 1801 01/14/22 2217 01/15/22 0819  GLUCAP 193* 229* 159*   D-Dimer No results for input(s): "DDIMER" in the last 72 hours. Hgb A1c No results for input(s): "HGBA1C" in the last 72 hours. Lipid Profile No results for input(s): "CHOL", "HDL", "LDLCALC", "TRIG", "CHOLHDL", "LDLDIRECT" in the last 72 hours. Thyroid function studies No results for input(s): "TSH", "T4TOTAL", "T3FREE", "THYROIDAB" in the last 72 hours.  Invalid input(s): "FREET3" Anemia work up No results for input(s): "VITAMINB12", "FOLATE", "FERRITIN", "TIBC", "IRON", "RETICCTPCT" in the last 72 hours. Urinalysis    Component Value Date/Time   COLORURINE YELLOW (A) 01/15/2022 1415   APPEARANCEUR CLEAR (A) 01/15/2022 1415   APPEARANCEUR Clear 03/31/2013 0011   LABSPEC 1.009 01/15/2022 1415   LABSPEC 1.005 03/31/2013 0011   PHURINE 6.0 01/15/2022 1415   GLUCOSEU NEGATIVE 01/15/2022 1415   GLUCOSEU Negative 03/31/2013 0011   HGBUR NEGATIVE 01/15/2022 1415   BILIRUBINUR NEGATIVE 01/15/2022 1415   BILIRUBINUR Negative 03/31/2013 0011   KETONESUR NEGATIVE 01/15/2022 1415   PROTEINUR NEGATIVE 01/15/2022 1415   NITRITE NEGATIVE 01/15/2022 1415   LEUKOCYTESUR NEGATIVE 01/15/2022 1415   LEUKOCYTESUR Negative 03/31/2013 0011   Sepsis Labs Recent Labs  Lab 01/14/22 0846 01/15/22 0442  WBC 7.7 6.5   Microbiology Recent Results (from the past 240 hour(s))  SARS  Coronavirus 2 by RT PCR (hospital order, performed in [place] hospital lab) *cepheid single result test* Anterior Nasal Swab     Status: None   Collection Time: 01/14/22  8:45 AM   Specimen: Anterior Nasal Swab  Result Value Ref Range Status   SARS Coronavirus 2 by RT PCR NEGATIVE NEGATIVE Final    Comment: (NOTE) SARS-CoV-2 target nucleic acids are NOT DETECTED.  The SARS-CoV-2 RNA is generally detectable in upper and lower respiratory specimens during the acute phase of infection. The lowest concentration of SARS-CoV-2 viral copies this assay can detect is 250 copies / mL. A negative result does not preclude SARS-CoV-2 infection and should not be used as the sole basis for treatment or other patient management decisions.  A negative result may occur with improper specimen collection / handling, submission of specimen other than nasopharyngeal swab, presence of viral mutation(s) within the areas targeted by this assay, and inadequate number of viral copies (<250 copies / mL). A negative result must  be combined with clinical observations, patient history, and epidemiological information.  Fact Sheet for Patients:   https://www.patel.info/  Fact Sheet for Healthcare Providers: https://hall.com/  This test is not yet approved or  cleared by the [place] FDA and has been authorized for detection and/or diagnosis of SARS-CoV-2 by FDA under an Emergency Use Authorization (EUA).  This EUA will remain in effect (meaning this test can be used) for the duration of the COVID-19 declaration under Section 564(b)(1) of the Act, 21 U.S.C. section 360bbb-3(b)(1), unless the authorization is terminated or revoked sooner.  Performed at [facility], [address] [place], [place] [address]      Total time spend on discharging this patient, including the last patient exam, discussing the hospital stay, instructions for ongoing care  as it relates to all pertinent caregivers, as well as preparing the medical discharge records, prescriptions, and/or referrals as applicable, is 60 minutes.    Enzo Bi, MD  [facility] 01/15/2022, 3:00 PM

## 2022-01-15 NOTE — ED Notes (Signed)
Hospice people at bedside talking to pt and daughter. Pt may discharge with hospice.

## 2022-01-15 NOTE — Discharge Instructions (Signed)

## 2022-01-15 NOTE — ED Notes (Signed)
Spoke with provider who authorized turning off cardizem drip after PO metoprolol.

## 2022-01-15 NOTE — ED Notes (Signed)
CBG 159. Daughter at bedside. Pt eating breakfast.

## 2022-01-15 NOTE — Consult Note (Signed)
Consultation Note Date: 01/15/2022   Patient Name: Christine Obrien  DOB: [birth year]  MRN: [id number]  Age / Sex: 81 y.o., female  PCP: [name], MD Referring Physician: Enzo Bi, MD  Reason for Consultation: Establishing goals of care  HPI/Patient Profile: 81 y.o. female  with past medical history of A-fib (Eliquis), depression, anxiety, CAD status post CABG (2019), right and daughter neurectomy (January 2637), systolic congestive heart failure (ICD), left bundle branch block, type 2 diabetes, HLD, and recent hospitalization at [facility] for COPD/CHF exacerbation.  Patient was discharged from [facility] less than 12 hours before presenting to [facility] the emergency department via EMS.  Pt is being seen in ED for SOB unresolved with use of home medications/nebs/supplemental oxygen.   Clinical Assessment and Goals of Care: I have reviewed medical records including EPIC notes, labs and imaging, assessed the patient and then met with patient, her daughters Faythe Dingwall and Angela Nevin, and Ronda's husband Cassandria Santee  to discuss diagnosis prognosis, [place], EOL wishes, disposition and options.  I introduced Palliative Medicine as specialized medical care for people living with serious illness. It focuses on providing relief from the symptoms and stress of a serious illness. The goal is to improve quality of life for both the patient and the family.  We discussed a brief life review of the patient. Pt is widowed after 40+ years of marriage. SHe has two daughters (both present). 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  As far as functional and nutritional status patient has had a decline in mobility d/t SOB with exertion. She and family report that it takes almost all of her energy to get from bed to chair to bathroom and back. Pt endorses a decline in appetite despite understanding she needs "food for fuel, not  fun".   We discussed patient's current illness and what it means in the larger context of patient's on-going co-morbidities. Chronic, irreversible nature of COPD and CHF discussed.  I attempted to elicit values and goals of care important to the patient. Pt states she wants to go home with be with her family and avoid the hospital if at all possible. The difference between aggressive medical intervention and comfort care was considered in light of the patient's goals of care. Hospice in the home, hospice inpatient unit, and outpatient palliative services discussed in detail. Patient and family would like to avoid admission to hospital at this time in hopes of being able to go home with hospice services. Family shares preference for [facility] as they have had multiple family members under [facility] services in the past.   Attending Dr. Billie Ruddy, [facility], RN, and hospice liaison made aware of patient's wishes.   Advance directives, concepts specific to code status, artificial feeding and hydration, and rehospitalization were considered and discussed. Pt states she would not want to be placed on a ventilator and would never want to undergo CPR. In the event of a cardiopulmonary arrest, pt states her wishes are to allow a natural death and to not  intervene medically. She wants more time to speak with her family regarding her code status, but is clear she would never want to be placed on a ventilator. Discussed DNR and DNI status.   Education offered regarding concept specific to human mortality and the limitations of medical interventions to prolong life when the body begins to fail to thrive.   Discussed with patient/family the importance of continued conversation with family and the medical providers regarding overall plan of care and treatment options, ensuring decisions are within the context of the patient's values and GOCs.    Questions and concerns were addressed. The family was encouraged to  call with questions or concerns. Hospice liaison to meet with family and further discuss home with hospice services.   Primary Decision Maker PATIENT  Code Status/Advance Care Planning: Full code  Prognosis:   < 6 months  Discharge Planning: Home with Hospice  Primary Diagnoses: Present on Admission:  Acute on chronic respiratory failure with hypoxia (HCC)  PAF (paroxysmal atrial fibrillation) (HCC)  Major depressive disorder, recurrent episode, moderate (HCC)  Hypothyroidism  Dyslipidemia  Essential hypertension  COPD exacerbation (HCC)  ICD (implantable cardioverter-defibrillator) in place  Gastroesophageal reflux disease without esophagitis  Anxiety, generalized  Acute systolic CHF (congestive heart failure) (HCC)  Elevated troponin   Physical Exam Vitals reviewed.  Constitutional:      General: She is not in acute distress.    Appearance: She is not ill-appearing or toxic-appearing.  HENT:     Head: Normocephalic.  Cardiovascular:     Rate and Rhythm: Normal rate. Rhythm irregular.  Pulmonary:     Breath sounds: No rhonchi or rales.  Chest:     Chest wall: No mass or tenderness.  Musculoskeletal:        General: Normal range of motion.     Comments: Generalized weakness  Skin:    General: Skin is warm and dry.  Neurological:     Mental Status: She is alert and oriented to person, place, and time.  Psychiatric:        Mood and Affect: Mood normal. Mood is not anxious.        Behavior: Behavior normal. Behavior is not agitated.     Palliative Assessment/Data: 50%     Thank you for this consult. Palliative medicine will continue to follow and assist holistically.   Time Total: 90 minutes Greater than 50%  of this time was spent counseling and coordinating care related to the above assessment and plan.  Signed by: Jordan Hawks, DNP, FNP-BC Palliative Medicine    Please contact Palliative Medicine Team phone at [phone] for questions and  concerns.  For individual provider: See Shea Evans

## 2022-01-15 NOTE — Progress Notes (Signed)
Manufacturing engineer [facility]) Hospital Liaison Note   Received request from PMT provider/Kathryn S. for hospice services at home after discharge. Chart and patient information under review by [facility] physician. Hospice eligibility approved.   Spoke with patient daughter(s) Faythe Dingwall & Angela Nevin to initiate education related to hospice philosophy,services, and team approach to care. All verbalized understanding of information given. Per discussion, the plan is for patient to discharge home via [facility] once cleared to DC. RN/Reina to arrange transport.    DME needs discussed. Patient has the following equipment in the home (Through [employer]): O2 @ 2L w/ adapt (on 5L in the hospital and family reports O2 in the home is fine) [facility] Shower chair Patient requests the following equipment for delivery: Bedside Table Travel Wheelchair  Patient will not be discharging to address listed in the chart. Patient will be discharging to:  [address] [place] [phone]  Suanne Marker is the family member to contact to arrange time of equipment delivery.    Please send signed and completed DNR home with patient/family. Please provide prescriptions at discharge as needed to ensure ongoing symptom management.    [facility] information and contact numbers given to family & above information shared with TOC.   Please call with any questions/concerns.    Thank you for the opportunity to participate in this patient's care.   Daphene Calamity, MSW [facility] Liaison  [phone]

## 2022-01-15 NOTE — ED Notes (Signed)
Messaged provider re: HR now 60, NSR with 1st degree AV block, to advise on cardizem drip. Turned cardizem to 5 mg/hr.

## 2022-01-15 NOTE — ED Notes (Signed)
Palliative care NP at bedside talking with pt and daughter. Pt has had many recent hospitalizations with shorter durations of time periods between hospitalizations.

## 2022-01-15 NOTE — ED Notes (Signed)
[facility]  CALLED  FOR  TRANSPORT  HOME 

## 2022-01-15 NOTE — ED Notes (Signed)
[facility] called to transport patient to home

## 2022-01-15 NOTE — ED Notes (Signed)
Dr Billie Ruddy saw pt and family at bedside. Pt wioll be discharged once Venturi mask supplied by RT. RT has been notified and will call this RN back.

## 2022-01-15 NOTE — Progress Notes (Signed)
Initial Nutrition Assessment  DOCUMENTATION CODES:   Not applicable  INTERVENTION:   -Liberalize diet to 2 gram sodium for wider variety of meal selections -MVI with minerals daily -Ensure Enlive po BID, each supplement provides 350 kcal and 20 grams of protein -Provided "Low Sodium Nutrition Therapy" handout from [facility] Nutrition Care Manual; attached to AVS/ discharge summary  NUTRITION DIAGNOSIS:   Increased nutrient needs related to chronic illness (COPD) as evidenced by estimated needs.  GOAL:   Patient will meet greater than or equal to 90% of their needs  MONITOR:   PO intake, Supplement acceptance  REASON FOR ASSESSMENT:   Rounds    ASSESSMENT:   Pt with history of atrial fibrillation on anticoagulation, depression, anxiety, hypothyroid, non-insulin-dependent diabetes mellitus, hypertension, GERD, neuropathy, who presents for chief concerns of shortness of breath.  Pt admitted with acute on chronic respiratory failure secondary to COPD exacerbation.   Reviewed I/O's: -800 ml x 24 hours  UOP: 800 ml x 24 hours  RD pulled to chart secondary to CHF diagnosis.   Pt unavailable at time of visit. RD unable to obtain further nutrition-related history or complete nutrition-focused physical exam at this time.    Pt currently on a heart healthy, carb modified diet. No meal completion data available to assess at this time.   Reviewed wt hx; pt has experienced a 7.4% wt loss over the past month, which is significant for time frame. Pt at high risk for malnutrition secondary to advanced age and multiple co-morbidities, however, unable to identify at this time. Pt would greatly benefit from addition of oral nutrition supplements.   Medications reviewed and include cardizem.  Lab Results  Component Value Date   HGBA1C 6.1 (H) 12/24/2021    PTA DM medications are 10 mg jardiance daily and 500 mg metformin BID.   Labs reviewed: K: 3.3, CBGS: 159-229 (inpatient orders for  glycemic control are 0-20 units insulin aspart TID with meals and 0-5 units insulin aspart daily at bedtime).    Diet Order:   Diet Order             Diet heart healthy/carb modified Room service appropriate? Yes; Fluid consistency: Thin  Diet effective now                   EDUCATION NEEDS:   No education needs have been identified at this time  Skin:  Skin Assessment: Reviewed RN Assessment  Last BM:  Unknown  Height:   Ht Readings from Last 1 Encounters:  12/10/21 '5\' 3"'$  (1.6 m)    Weight:   Wt Readings from Last 1 Encounters:  01/14/22 56.7 kg    Ideal Body Weight:  52.3 kg  BMI:  Body mass index is 22.14 kg/m.  Estimated Nutritional Needs:   Kcal:  1500-1700  Protein:  75-90 grams  Fluid:  > 1.5 L    Loistine Chance, RD, LDN, [place] Registered Dietitian II Certified Diabetes Care and Education Specialist Please refer to [facility] for RD and/or RD on-call/weekend/after hours pager

## 2022-01-16 ENCOUNTER — Ambulatory Visit: Payer: Medicare Other | Admitting: Family

## 2022-01-23 ENCOUNTER — Ambulatory Visit: Payer: Medicare Other | Admitting: Family

## 2022-02-15 IMAGING — CR DG HAND COMPLETE 3+V*L*
1 series · 3 of 3 positions shown · non-contrast
Comparison: None.

CLINICAL DATA: Fell, lacerations

EXAM:
LEFT HAND - COMPLETE 3+ VIEW

[Series 1: dg hand complete left · 0.14mm/px · 3 of 3 slices shown]
[im 1/3]
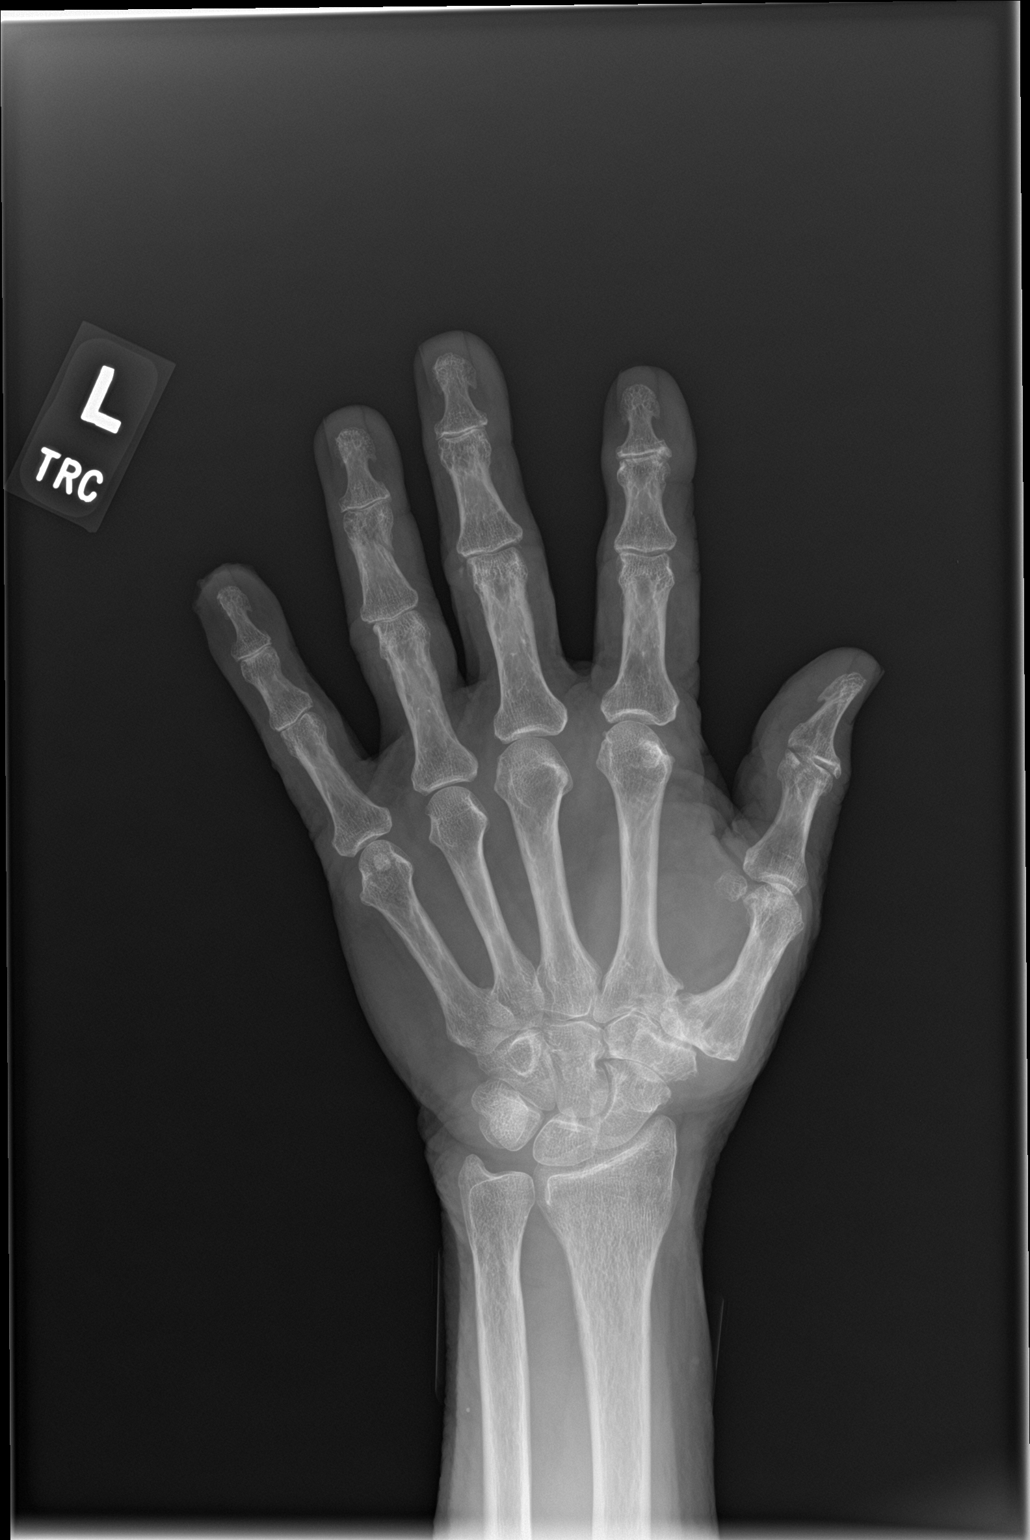
[im 2/3]
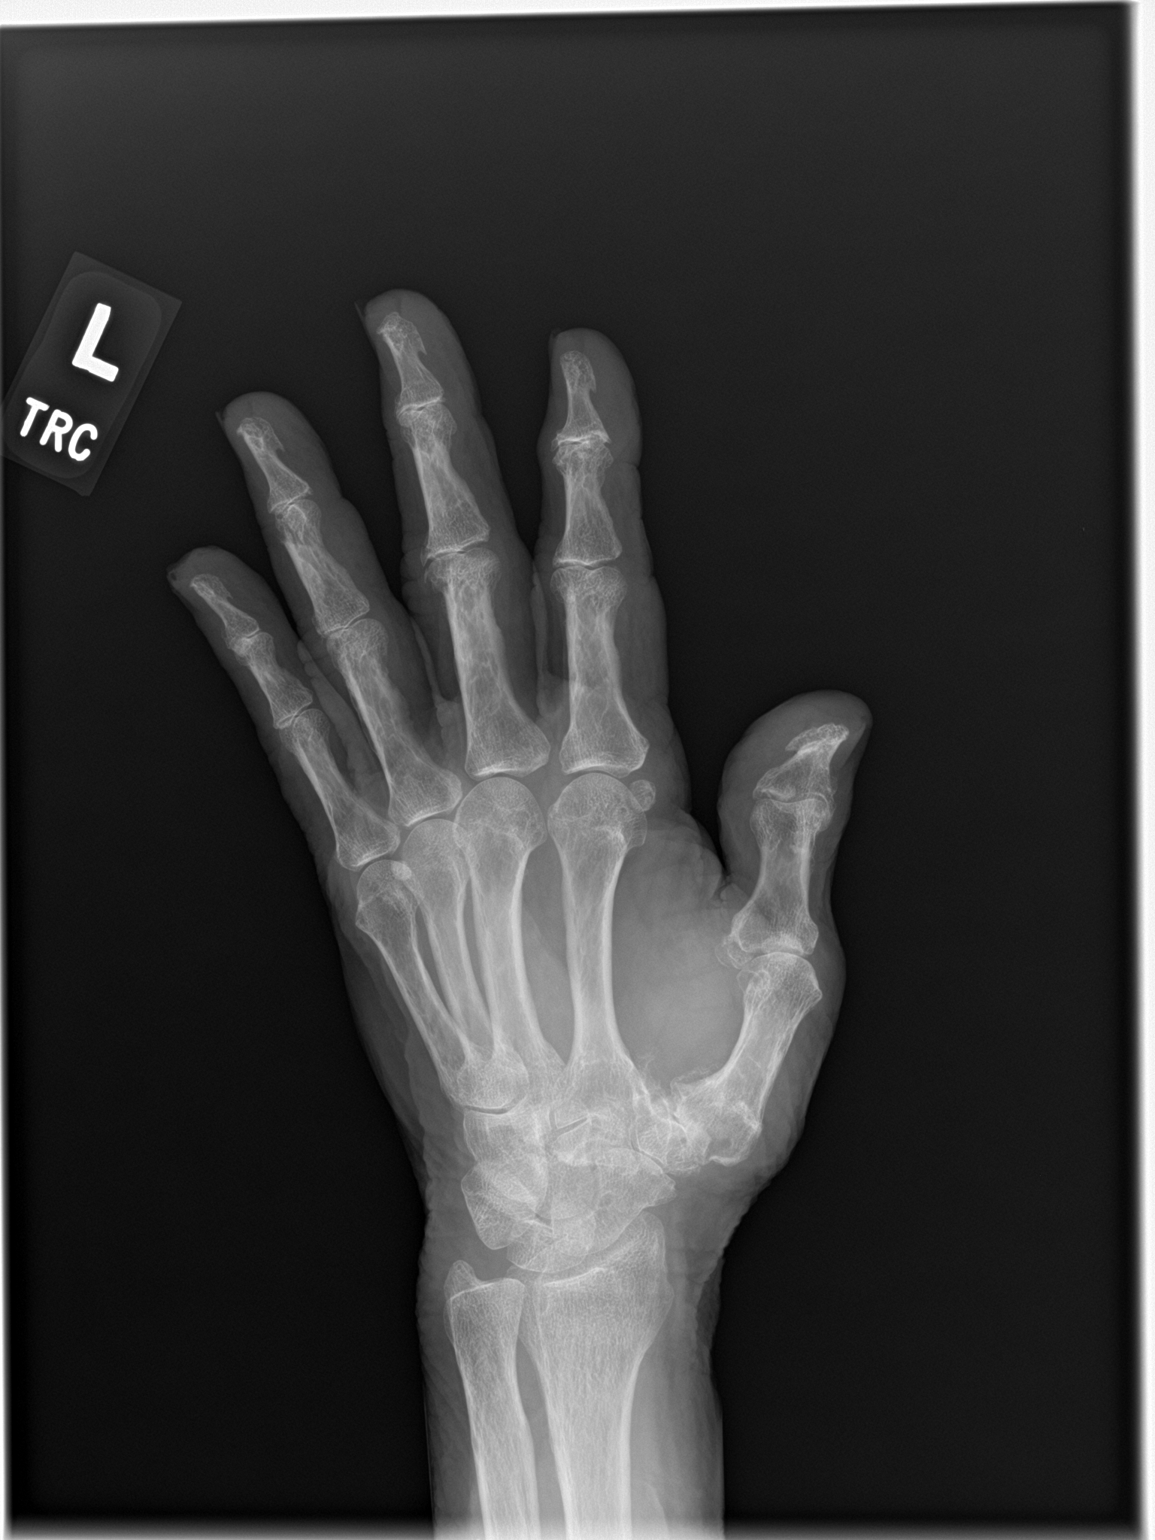
[im 3/3]
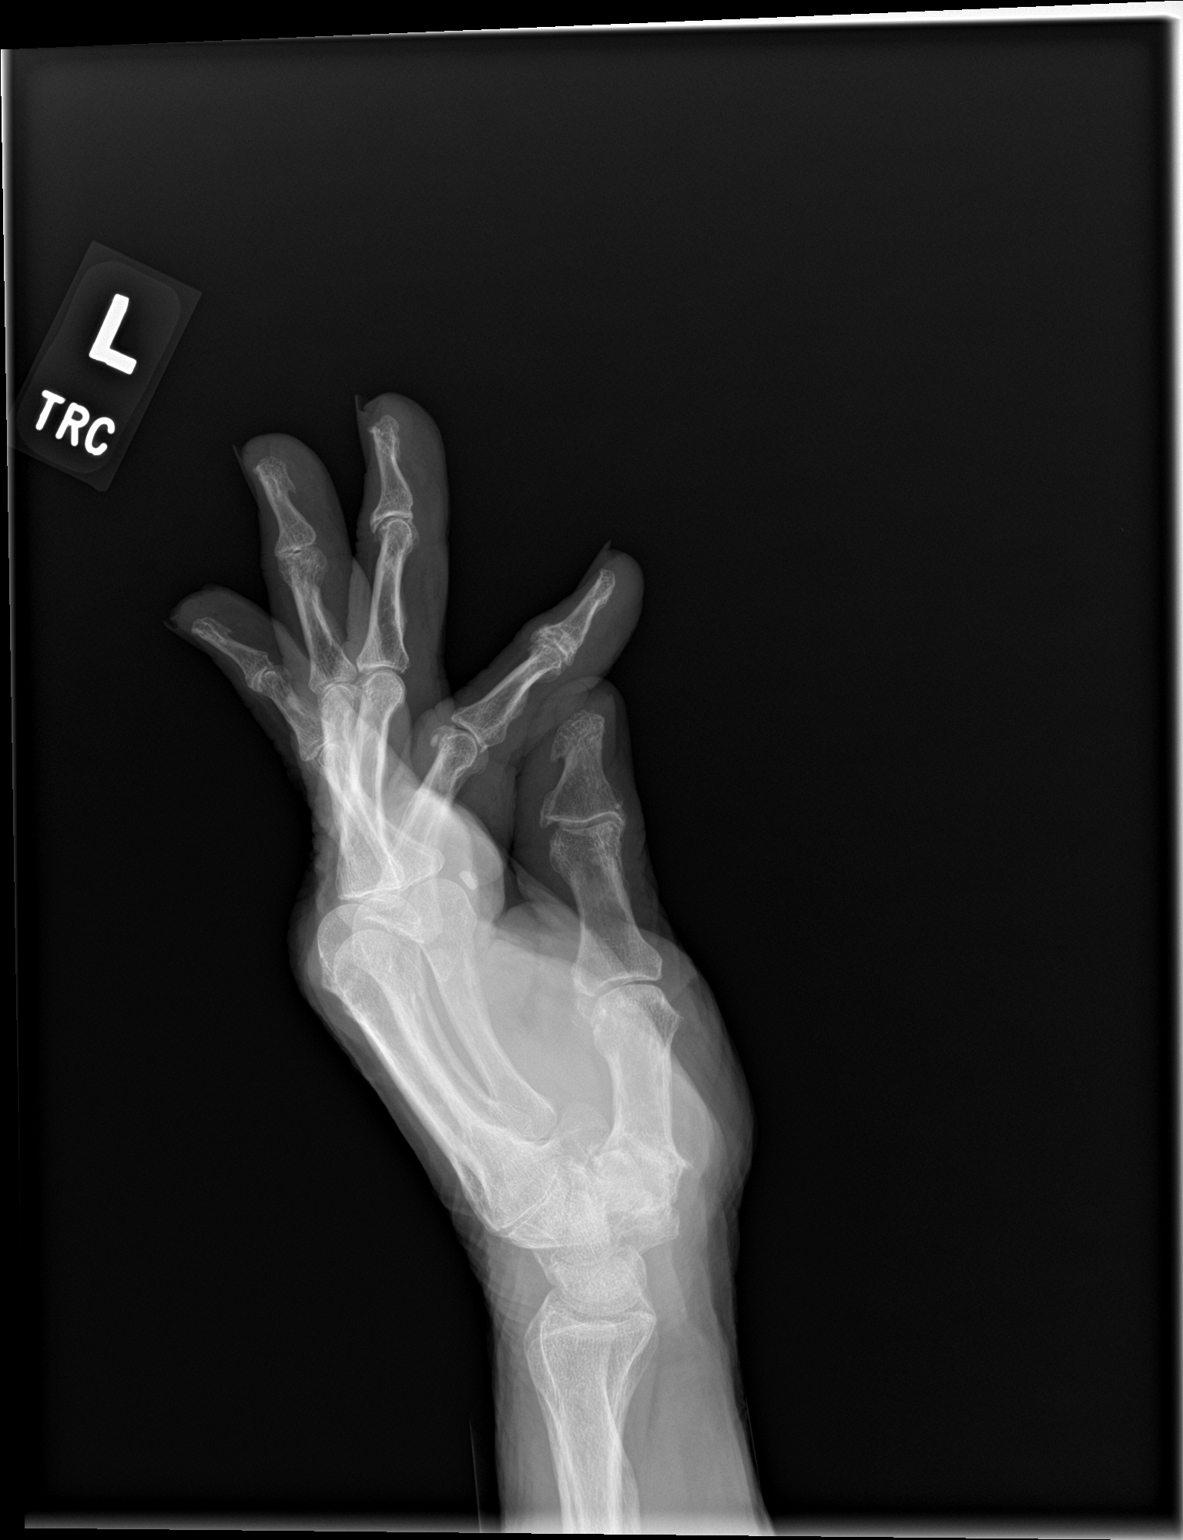

[3 of 3 positions shown; findings below may reference images not displayed]

FINDINGS: Frontal, oblique, lateral views of the left hand are obtained. There
are no acute displaced fractures. Diffuse osteoarthritis, with joint
space narrowing and osteophyte formation greatest in the radial
aspect of the carpus and at the first carpometacarpal joint. Soft
tissues are unremarkable. No radiopaque foreign bodies.
IMPRESSION: 1. Diffuse osteoarthritis.  No acute displaced fracture.

## 2022-02-15 IMAGING — CT CT CERVICAL SPINE W/O CM
2 series · 13 of 27 positions shown, 16 images · non-contrast
Comparison: 07/02/2017

CLINICAL DATA: Fell, loss of balance

EXAM:
CT CERVICAL SPINE WITHOUT CONTRAST
TECHNIQUE: Multidetector CT imaging of the cervical spine was performed without
intravenous contrast. Multiplanar CT image reconstructions were also
generated.

[Series 3: c spine soft · axial · 0.35mm/px · z∈[+326,+454]mm · 8 of 76 slices shown, 10 images]
[im 6/76  soft-tissue]
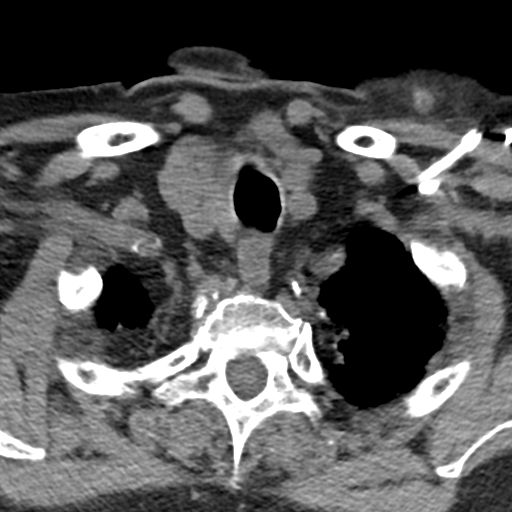
[im 6/76  bone]
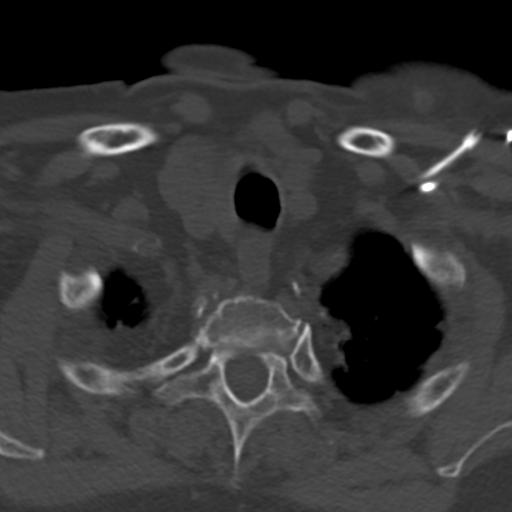
[im 18/76  bone]
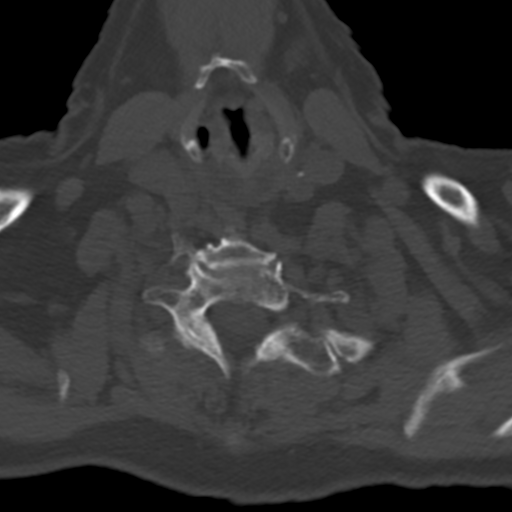
[im 24/76  bone]
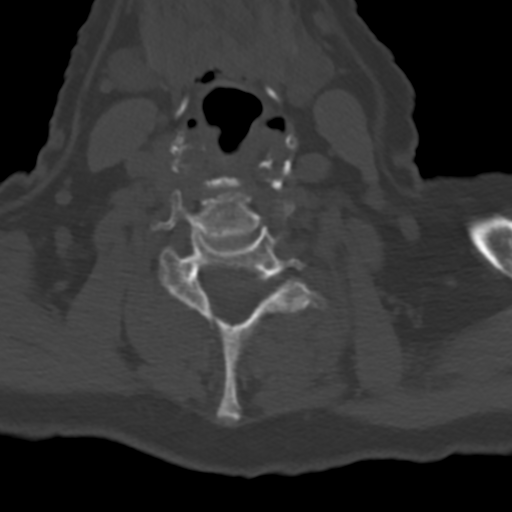
[im 35/76  bone]
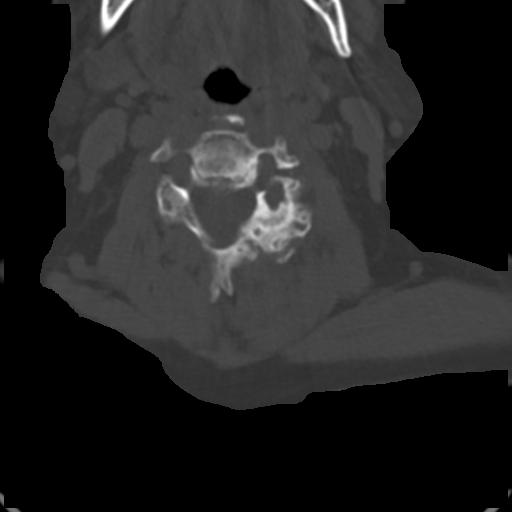
[im 41/76  soft-tissue]
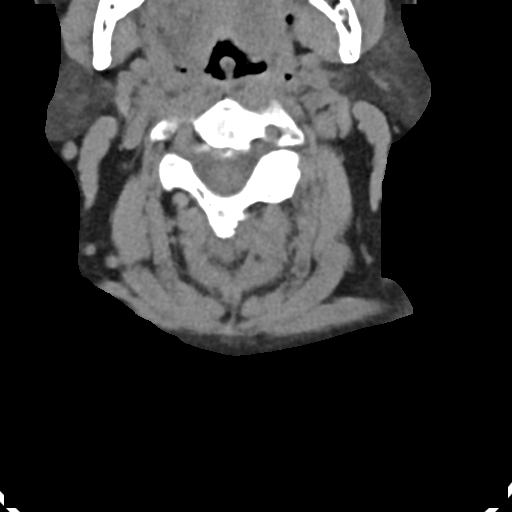
[im 41/76  bone]
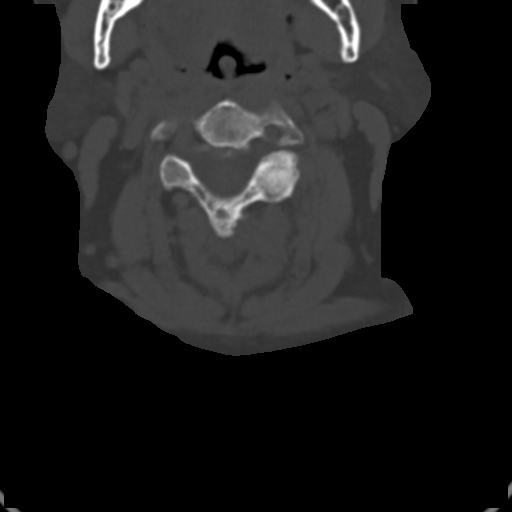
[im 52/76  bone]
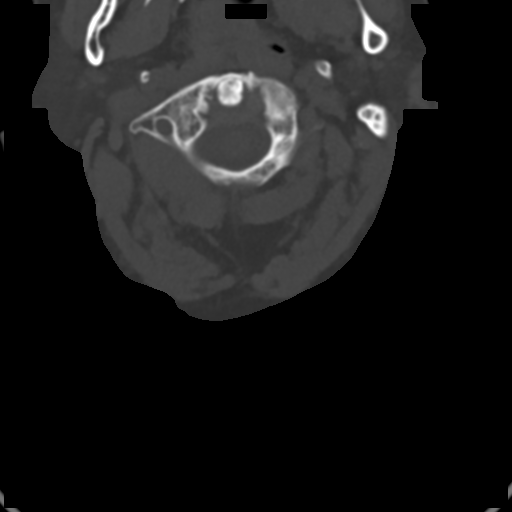
[im 58/76  bone]
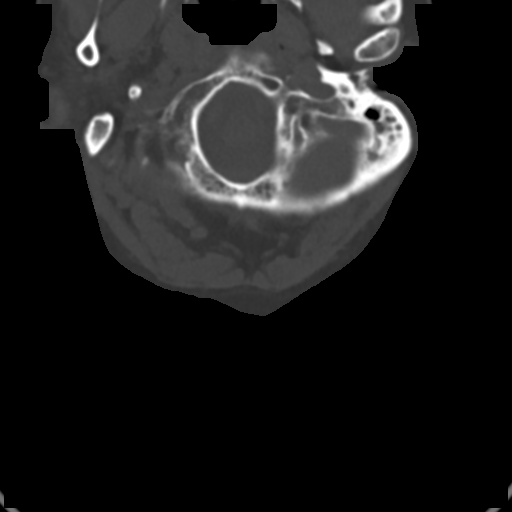
[im 70/76  bone]
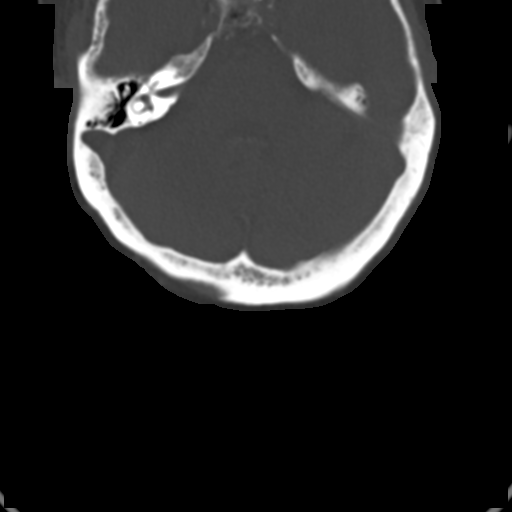

[Series 4: sagittal bone · sagittal · 0.21mm/px · 5 of 56 slices shown, 6 images]
[im 19/56  bone]
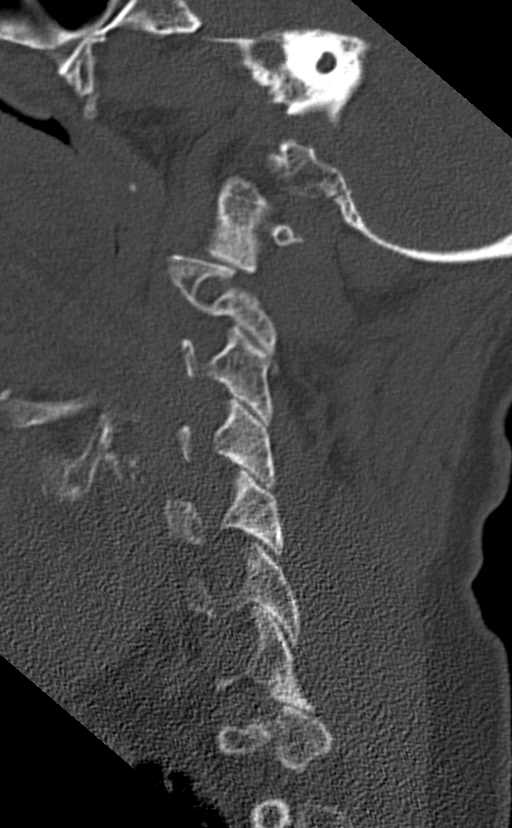
[im 23/56  bone]
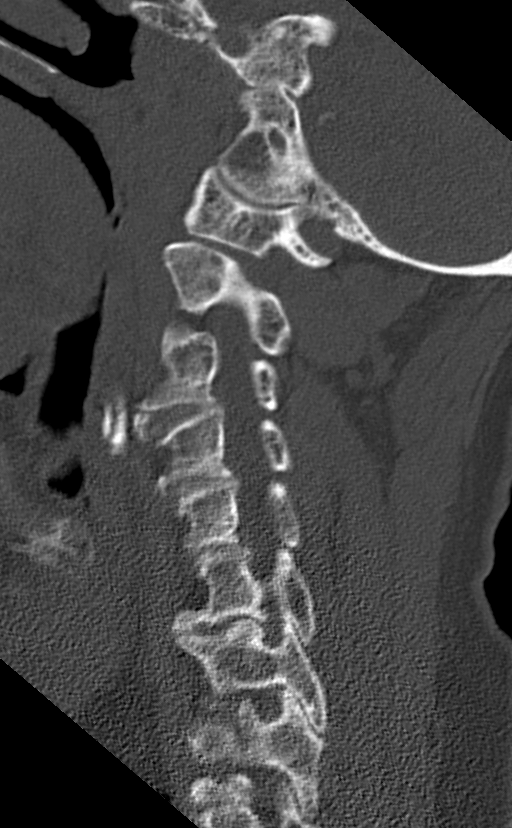
[im 28/56  soft-tissue]
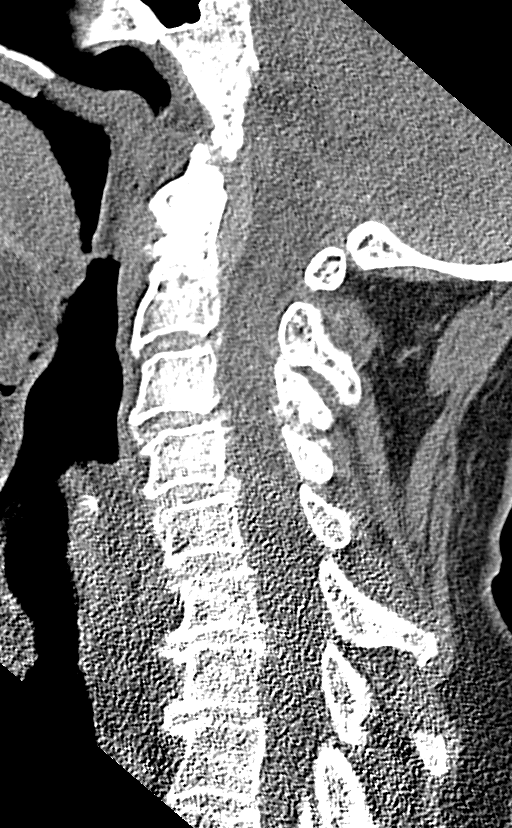
[im 28/56  bone]
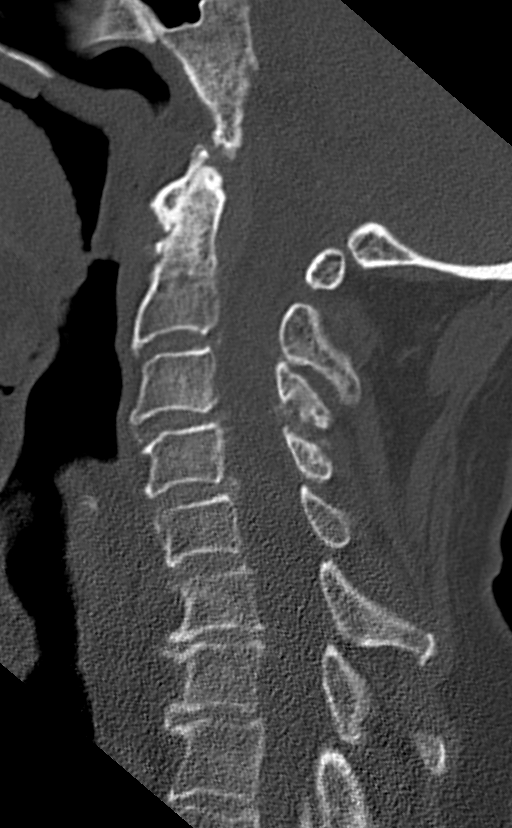
[im 33/56  bone]
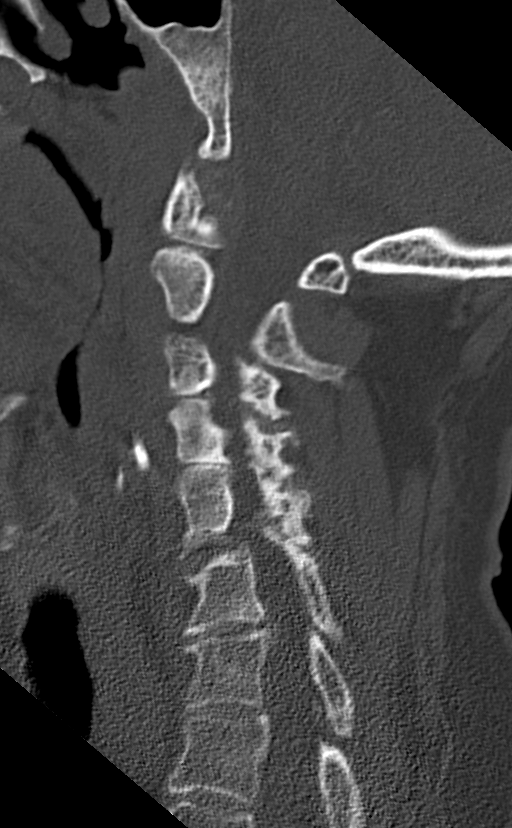
[im 37/56  bone]
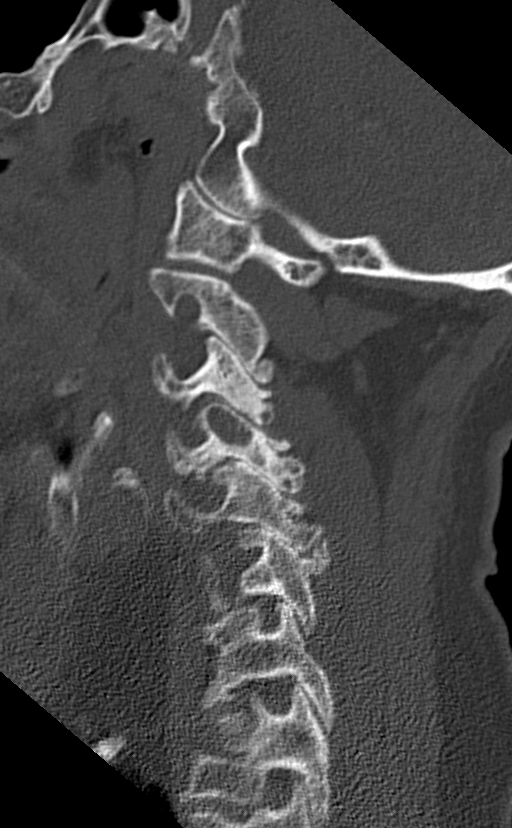

[13 of 27 positions shown; findings below may reference images not displayed]

FINDINGS: Alignment: There is slight reversal of normal cervical lordosis at
the C6/C7 level, stable. Otherwise alignment remains anatomic.

Skull base and vertebrae: No acute fracture. No primary bone lesion
or focal pathologic process.

Soft tissues and spinal canal: No prevertebral fluid or swelling. No
visible canal hematoma. Extensive atherosclerosis within the carotid
arteries.

Disc levels: Significant left predominant facet hypertrophic changes
are seen from C3 through C7. This results in left predominant neural
foraminal encroachment at these levels. There is mild diffuse
spondylosis, greatest at C6-7, with mild right predominant neural
foraminal encroachment.

Upper chest: Airway is patent. Chronic subpleural scarring at the
right apex unchanged.

Other: Reconstructed images demonstrate no additional findings.
IMPRESSION: 1. No acute cervical spine fracture.
2. Multilevel spondylosis and facet hypertrophy as above.

## 2022-02-17 ENCOUNTER — Encounter (INDEPENDENT_AMBULATORY_CARE_PROVIDER_SITE_OTHER): Payer: Self-pay

## 2022-02-26 ENCOUNTER — Other Ambulatory Visit (INDEPENDENT_AMBULATORY_CARE_PROVIDER_SITE_OTHER): Payer: Self-pay | Admitting: Nurse Practitioner

## 2022-02-26 DIAGNOSIS — I739 Peripheral vascular disease, unspecified: Secondary | ICD-10-CM

## 2022-02-26 DIAGNOSIS — I6523 Occlusion and stenosis of bilateral carotid arteries: Secondary | ICD-10-CM

## 2022-03-04 ENCOUNTER — Encounter (INDEPENDENT_AMBULATORY_CARE_PROVIDER_SITE_OTHER): Payer: Medicare Other

## 2022-03-04 ENCOUNTER — Ambulatory Visit (INDEPENDENT_AMBULATORY_CARE_PROVIDER_SITE_OTHER): Payer: Medicare Other | Admitting: Nurse Practitioner

## 2022-03-21 DEATH — deceased
